# Patient Record
Sex: Female | Born: 1935 | Race: Black or African American | Hispanic: No | Smoking: Former smoker
Health system: Southern US, Community
[De-identification: ages and names within clinical notes are randomized; demographics above are authoritative.]

## PROBLEM LIST (undated history)

## (undated) DIAGNOSIS — K5790 Diverticulosis of intestine, part unspecified, without perforation or abscess without bleeding: Secondary | ICD-10-CM

## (undated) DIAGNOSIS — K222 Esophageal obstruction: Secondary | ICD-10-CM

## (undated) DIAGNOSIS — E785 Hyperlipidemia, unspecified: Secondary | ICD-10-CM

## (undated) DIAGNOSIS — K648 Other hemorrhoids: Secondary | ICD-10-CM

## (undated) DIAGNOSIS — I10 Essential (primary) hypertension: Secondary | ICD-10-CM

## (undated) DIAGNOSIS — K589 Irritable bowel syndrome without diarrhea: Secondary | ICD-10-CM

## (undated) DIAGNOSIS — K219 Gastro-esophageal reflux disease without esophagitis: Secondary | ICD-10-CM

## (undated) DIAGNOSIS — Z860100 Personal history of colon polyps, unspecified: Secondary | ICD-10-CM

## (undated) DIAGNOSIS — G459 Transient cerebral ischemic attack, unspecified: Secondary | ICD-10-CM

## (undated) DIAGNOSIS — J449 Chronic obstructive pulmonary disease, unspecified: Secondary | ICD-10-CM

## (undated) DIAGNOSIS — D689 Coagulation defect, unspecified: Secondary | ICD-10-CM

## (undated) DIAGNOSIS — F32A Depression, unspecified: Secondary | ICD-10-CM

## (undated) DIAGNOSIS — F329 Major depressive disorder, single episode, unspecified: Secondary | ICD-10-CM

## (undated) DIAGNOSIS — M1990 Unspecified osteoarthritis, unspecified site: Secondary | ICD-10-CM

## (undated) HISTORY — DX: Major depressive disorder, single episode, unspecified: F32.9

## (undated) HISTORY — DX: Personal history of colon polyps, unspecified: Z86.0100

## (undated) HISTORY — DX: Diverticulosis of intestine, part unspecified, without perforation or abscess without bleeding: K57.90

## (undated) HISTORY — DX: Unspecified osteoarthritis, unspecified site: M19.90

## (undated) HISTORY — DX: Irritable bowel syndrome, unspecified: K58.9

## (undated) HISTORY — DX: Coagulation defect, unspecified: D68.9

## (undated) HISTORY — DX: Gastro-esophageal reflux disease without esophagitis: K21.9

## (undated) HISTORY — DX: Hyperlipidemia, unspecified: E78.5

## (undated) HISTORY — DX: Essential (primary) hypertension: I10

## (undated) HISTORY — DX: Depression, unspecified: F32.A

## (undated) HISTORY — DX: Other hemorrhoids: K64.8

## (undated) HISTORY — DX: Personal history of colonic polyps: Z86.010

## (undated) HISTORY — DX: Esophageal obstruction: K22.2

## (undated) HISTORY — DX: Chronic obstructive pulmonary disease, unspecified: J44.9

---

## 1993-06-19 HISTORY — PX: LAPAROSCOPIC CHOLECYSTECTOMY: SUR755

## 1997-08-17 ENCOUNTER — Ambulatory Visit (HOSPITAL_COMMUNITY): Admission: RE | Admit: 1997-08-17 | Discharge: 1997-08-17 | Payer: Self-pay | Admitting: Obstetrics & Gynecology

## 1997-10-09 ENCOUNTER — Encounter: Admission: RE | Admit: 1997-10-09 | Discharge: 1997-10-09 | Payer: Self-pay | Admitting: Internal Medicine

## 1997-10-13 ENCOUNTER — Encounter: Admission: RE | Admit: 1997-10-13 | Discharge: 1997-10-13 | Payer: Self-pay | Admitting: Obstetrics & Gynecology

## 1997-10-13 ENCOUNTER — Other Ambulatory Visit: Admission: RE | Admit: 1997-10-13 | Discharge: 1997-10-13 | Payer: Self-pay | Admitting: Obstetrics & Gynecology

## 1998-09-07 ENCOUNTER — Ambulatory Visit (HOSPITAL_COMMUNITY): Admission: RE | Admit: 1998-09-07 | Discharge: 1998-09-07 | Payer: Self-pay | Admitting: Internal Medicine

## 1998-09-21 ENCOUNTER — Emergency Department (HOSPITAL_COMMUNITY): Admission: EM | Admit: 1998-09-21 | Discharge: 1998-09-21 | Payer: Self-pay | Admitting: Internal Medicine

## 1998-10-05 ENCOUNTER — Encounter: Admission: RE | Admit: 1998-10-05 | Discharge: 1998-10-05 | Payer: Self-pay | Admitting: Obstetrics & Gynecology

## 1998-10-06 ENCOUNTER — Emergency Department (HOSPITAL_COMMUNITY): Admission: EM | Admit: 1998-10-06 | Discharge: 1998-10-06 | Payer: Self-pay | Admitting: Emergency Medicine

## 1998-11-02 ENCOUNTER — Ambulatory Visit (HOSPITAL_COMMUNITY): Admission: RE | Admit: 1998-11-02 | Discharge: 1998-11-02 | Payer: Self-pay | Admitting: Internal Medicine

## 1998-11-02 ENCOUNTER — Encounter: Payer: Self-pay | Admitting: Internal Medicine

## 1998-11-02 ENCOUNTER — Encounter: Admission: RE | Admit: 1998-11-02 | Discharge: 1998-11-02 | Payer: Self-pay | Admitting: Internal Medicine

## 1998-12-10 ENCOUNTER — Encounter: Admission: RE | Admit: 1998-12-10 | Discharge: 1998-12-10 | Payer: Self-pay | Admitting: Internal Medicine

## 1998-12-16 ENCOUNTER — Encounter: Admission: RE | Admit: 1998-12-16 | Discharge: 1998-12-16 | Payer: Self-pay | Admitting: Internal Medicine

## 1998-12-17 ENCOUNTER — Emergency Department (HOSPITAL_COMMUNITY): Admission: EM | Admit: 1998-12-17 | Discharge: 1998-12-17 | Payer: Self-pay | Admitting: Emergency Medicine

## 1999-01-17 ENCOUNTER — Encounter: Admission: RE | Admit: 1999-01-17 | Discharge: 1999-01-17 | Payer: Self-pay | Admitting: Internal Medicine

## 1999-04-14 ENCOUNTER — Encounter: Admission: RE | Admit: 1999-04-14 | Discharge: 1999-04-14 | Payer: Self-pay | Admitting: Obstetrics

## 1999-06-21 ENCOUNTER — Encounter: Admission: RE | Admit: 1999-06-21 | Discharge: 1999-06-21 | Payer: Self-pay | Admitting: Obstetrics & Gynecology

## 1999-06-27 ENCOUNTER — Encounter: Admission: RE | Admit: 1999-06-27 | Discharge: 1999-06-27 | Payer: Self-pay | Admitting: Internal Medicine

## 1999-07-14 ENCOUNTER — Ambulatory Visit (HOSPITAL_COMMUNITY): Admission: RE | Admit: 1999-07-14 | Discharge: 1999-07-14 | Payer: Self-pay | Admitting: Gastroenterology

## 1999-09-05 ENCOUNTER — Ambulatory Visit (HOSPITAL_COMMUNITY): Admission: RE | Admit: 1999-09-05 | Discharge: 1999-09-05 | Payer: Self-pay | Admitting: Gastroenterology

## 1999-10-19 ENCOUNTER — Ambulatory Visit (HOSPITAL_COMMUNITY): Admission: RE | Admit: 1999-10-19 | Discharge: 1999-10-19 | Payer: Self-pay | Admitting: Internal Medicine

## 2000-08-20 ENCOUNTER — Ambulatory Visit (HOSPITAL_COMMUNITY): Admission: RE | Admit: 2000-08-20 | Discharge: 2000-08-20 | Payer: Self-pay | Admitting: Internal Medicine

## 2000-08-20 ENCOUNTER — Encounter: Admission: RE | Admit: 2000-08-20 | Discharge: 2000-08-20 | Payer: Self-pay | Admitting: Internal Medicine

## 2000-09-11 ENCOUNTER — Encounter: Admission: RE | Admit: 2000-09-11 | Discharge: 2000-09-11 | Payer: Self-pay | Admitting: Internal Medicine

## 2000-09-24 ENCOUNTER — Encounter: Admission: RE | Admit: 2000-09-24 | Discharge: 2000-09-24 | Payer: Self-pay | Admitting: Internal Medicine

## 2000-11-30 ENCOUNTER — Ambulatory Visit (HOSPITAL_COMMUNITY): Admission: RE | Admit: 2000-11-30 | Discharge: 2000-11-30 | Payer: Self-pay | Admitting: Internal Medicine

## 2000-11-30 ENCOUNTER — Encounter: Payer: Self-pay | Admitting: Internal Medicine

## 2001-04-10 ENCOUNTER — Emergency Department (HOSPITAL_COMMUNITY): Admission: EM | Admit: 2001-04-10 | Discharge: 2001-04-10 | Payer: Self-pay | Admitting: Emergency Medicine

## 2001-08-07 ENCOUNTER — Encounter: Admission: RE | Admit: 2001-08-07 | Discharge: 2001-08-07 | Payer: Self-pay

## 2001-09-23 ENCOUNTER — Encounter: Admission: RE | Admit: 2001-09-23 | Discharge: 2001-09-23 | Payer: Self-pay | Admitting: Internal Medicine

## 2001-10-02 ENCOUNTER — Encounter: Admission: RE | Admit: 2001-10-02 | Discharge: 2001-10-02 | Payer: Self-pay | Admitting: Internal Medicine

## 2001-10-09 ENCOUNTER — Encounter: Admission: RE | Admit: 2001-10-09 | Discharge: 2001-10-09 | Payer: Self-pay

## 2001-12-03 ENCOUNTER — Encounter: Payer: Self-pay | Admitting: Internal Medicine

## 2001-12-03 ENCOUNTER — Ambulatory Visit (HOSPITAL_COMMUNITY): Admission: RE | Admit: 2001-12-03 | Discharge: 2001-12-03 | Payer: Self-pay | Admitting: Internal Medicine

## 2001-12-11 ENCOUNTER — Encounter: Admission: RE | Admit: 2001-12-11 | Discharge: 2001-12-11 | Payer: Self-pay | Admitting: Orthopedic Surgery

## 2001-12-11 ENCOUNTER — Encounter: Payer: Self-pay | Admitting: Orthopedic Surgery

## 2001-12-28 ENCOUNTER — Emergency Department (HOSPITAL_COMMUNITY): Admission: EM | Admit: 2001-12-28 | Discharge: 2001-12-28 | Payer: Self-pay | Admitting: Emergency Medicine

## 2001-12-28 ENCOUNTER — Encounter: Payer: Self-pay | Admitting: Emergency Medicine

## 2002-06-10 ENCOUNTER — Other Ambulatory Visit: Admission: RE | Admit: 2002-06-10 | Discharge: 2002-06-10 | Payer: Self-pay | Admitting: Obstetrics and Gynecology

## 2002-09-01 ENCOUNTER — Encounter: Admission: RE | Admit: 2002-09-01 | Discharge: 2002-09-01 | Payer: Self-pay | Admitting: Internal Medicine

## 2002-09-12 ENCOUNTER — Encounter: Admission: RE | Admit: 2002-09-12 | Discharge: 2002-09-12 | Payer: Self-pay | Admitting: Internal Medicine

## 2002-09-12 ENCOUNTER — Encounter: Payer: Self-pay | Admitting: Internal Medicine

## 2002-10-13 ENCOUNTER — Ambulatory Visit (HOSPITAL_COMMUNITY): Admission: RE | Admit: 2002-10-13 | Discharge: 2002-10-13 | Payer: Self-pay | Admitting: Internal Medicine

## 2002-10-13 ENCOUNTER — Encounter: Admission: RE | Admit: 2002-10-13 | Discharge: 2002-10-13 | Payer: Self-pay | Admitting: Internal Medicine

## 2003-01-09 ENCOUNTER — Ambulatory Visit (HOSPITAL_COMMUNITY): Admission: RE | Admit: 2003-01-09 | Discharge: 2003-01-09 | Payer: Self-pay | Admitting: Internal Medicine

## 2003-01-09 ENCOUNTER — Encounter: Payer: Self-pay | Admitting: Internal Medicine

## 2003-10-14 ENCOUNTER — Encounter: Admission: RE | Admit: 2003-10-14 | Discharge: 2003-10-14 | Payer: Self-pay | Admitting: Internal Medicine

## 2003-10-27 ENCOUNTER — Encounter: Admission: RE | Admit: 2003-10-27 | Discharge: 2003-10-27 | Payer: Self-pay | Admitting: Internal Medicine

## 2003-12-15 ENCOUNTER — Ambulatory Visit (HOSPITAL_COMMUNITY): Admission: RE | Admit: 2003-12-15 | Discharge: 2003-12-15 | Payer: Self-pay | Admitting: Neurology

## 2004-02-16 ENCOUNTER — Ambulatory Visit (HOSPITAL_COMMUNITY): Admission: RE | Admit: 2004-02-16 | Discharge: 2004-02-16 | Payer: Self-pay | Admitting: Internal Medicine

## 2004-04-04 ENCOUNTER — Ambulatory Visit: Payer: Self-pay | Admitting: Internal Medicine

## 2004-04-27 ENCOUNTER — Ambulatory Visit: Payer: Self-pay | Admitting: Internal Medicine

## 2004-05-19 ENCOUNTER — Other Ambulatory Visit: Admission: RE | Admit: 2004-05-19 | Discharge: 2004-05-19 | Payer: Self-pay | Admitting: Obstetrics and Gynecology

## 2004-06-27 ENCOUNTER — Ambulatory Visit: Payer: Self-pay | Admitting: Internal Medicine

## 2005-03-07 ENCOUNTER — Ambulatory Visit (HOSPITAL_COMMUNITY): Admission: RE | Admit: 2005-03-07 | Discharge: 2005-03-07 | Payer: Self-pay | Admitting: Obstetrics and Gynecology

## 2005-05-11 ENCOUNTER — Emergency Department (HOSPITAL_COMMUNITY): Admission: EM | Admit: 2005-05-11 | Discharge: 2005-05-11 | Payer: Self-pay | Admitting: *Deleted

## 2005-07-10 ENCOUNTER — Other Ambulatory Visit: Admission: RE | Admit: 2005-07-10 | Discharge: 2005-07-10 | Payer: Self-pay | Admitting: Obstetrics and Gynecology

## 2005-07-28 ENCOUNTER — Ambulatory Visit (HOSPITAL_COMMUNITY): Admission: RE | Admit: 2005-07-28 | Discharge: 2005-07-28 | Payer: Self-pay | Admitting: Obstetrics and Gynecology

## 2006-04-14 ENCOUNTER — Emergency Department (HOSPITAL_COMMUNITY): Admission: EM | Admit: 2006-04-14 | Discharge: 2006-04-14 | Payer: Self-pay | Admitting: Emergency Medicine

## 2006-08-15 ENCOUNTER — Emergency Department (HOSPITAL_COMMUNITY): Admission: EM | Admit: 2006-08-15 | Discharge: 2006-08-15 | Payer: Self-pay | Admitting: Emergency Medicine

## 2006-09-08 ENCOUNTER — Observation Stay (HOSPITAL_COMMUNITY): Admission: EM | Admit: 2006-09-08 | Discharge: 2006-09-09 | Payer: Self-pay | Admitting: Emergency Medicine

## 2006-12-11 ENCOUNTER — Emergency Department (HOSPITAL_COMMUNITY): Admission: EM | Admit: 2006-12-11 | Discharge: 2006-12-11 | Payer: Self-pay | Admitting: Emergency Medicine

## 2007-07-11 ENCOUNTER — Ambulatory Visit (HOSPITAL_COMMUNITY): Admission: RE | Admit: 2007-07-11 | Discharge: 2007-07-11 | Payer: Self-pay | Admitting: Family Medicine

## 2008-09-17 ENCOUNTER — Ambulatory Visit: Payer: Self-pay | Admitting: Pulmonary Disease

## 2008-09-17 DIAGNOSIS — J309 Allergic rhinitis, unspecified: Secondary | ICD-10-CM

## 2008-09-17 DIAGNOSIS — J45909 Unspecified asthma, uncomplicated: Secondary | ICD-10-CM

## 2008-09-17 DIAGNOSIS — Z9089 Acquired absence of other organs: Secondary | ICD-10-CM | POA: Insufficient documentation

## 2008-09-17 DIAGNOSIS — K219 Gastro-esophageal reflux disease without esophagitis: Secondary | ICD-10-CM | POA: Insufficient documentation

## 2008-10-24 ENCOUNTER — Encounter: Payer: Self-pay | Admitting: Pulmonary Disease

## 2008-11-26 ENCOUNTER — Ambulatory Visit: Payer: Self-pay | Admitting: Pulmonary Disease

## 2010-07-10 ENCOUNTER — Encounter: Payer: Self-pay | Admitting: Orthopaedic Surgery

## 2010-07-10 ENCOUNTER — Encounter: Payer: Self-pay | Admitting: Obstetrics and Gynecology

## 2010-11-04 NOTE — H&P (Signed)
[name], [name]            ACCOUNT NO.:  [id number]   MEDICAL RECORD NO.:  [id number]          PATIENT TYPE:  INP   LOCATION:  1827                         FACILITY:  [facility]   PHYSICIAN:  [name], M.D.DATE OF BIRTH:  05/19/1936   DATE OF ADMISSION:  09/07/2006  DATE OF DISCHARGE:                              HISTORY & PHYSICAL   CHIEF COMPLAINT:  Chest pain.   HISTORY OF PRESENT ILLNESS:  The patient is a 75 year old black female  with past medical history significant for hypertension and asthma who  presents with complaints of chest pain.  She states that she was in her  usual state of health until today when she went walking during her break  and it was pretty strenuous walk, after she came back into the office  she became short of breath and thought it was an asthma attack and so  used her inhalers.  She reports that she began having chest pain -  burning, after she used the inhalers.  The burning sensation resolved.  Thereafter she began having a chest tightness, mid sternal in location,  radiating to her neck, 7/10 in intensity and this was while she was  driving home.  She stopped at a fire station and had her blood pressure  checked and it was 180/112.  She sat and waited there for some time.  They had it rechecked and it had improved slightly.  She states that the  chest pain lasted about 20 minutes and seemed to improve but started  back up again and she was also having headaches.  Patient also admits to  left hand numbness while she was having the chest pain as well as  nausea.  She denies diaphoresis.  Patient does not recall any  alleviating factors.  She reports a positive family history for heart  disease, and a remote history of cardiovascular disease in her teenage  years - for about 3 years.   In the ER she had point-of-care markers which were negative.  A chest x-  ray was negative for acute infiltrates and she is admitted to the [facility]  hospitalist service for further evaluation and management.   PAST MEDICAL HISTORY:  1. As stated above.  2. ? History of borderline high cholesterol.   MEDICATIONS:  1. Albuterol p.r.n.  2. Benicar - does not know the dose.  3. Singulair 10 mg daily.   ALLERGIES:  PENICILLIN.   SOCIAL HISTORY:  Remarkable history of tobacco as above for about 3  years while she was a teenager.  She denies alcohol.   FAMILY HISTORY:  Mother deceased at age 39, she had an MI.  Her sister  had chronic heart disease/congestive heart failure and is deceased as  well.  Her brother had coronary artery disease, had an MI in his late-  81s.  He is deceased as well.   REVIEW OF SYSTEMS:  As per HPI, other review of systems negative.   PHYSICAL EXAM:  IN GENERAL: The patient is a pleasant, elderly black  female, she is alert, appropriate, in no apparent distress.  VITAL SIGNS: Her blood  pressure is 136/75, initially 173/90, pulse is  75, respiratory rate is 16, O2 saturation is 100%.  HEENT: PERRL, EOMI, moist mucus membranes, sclerae anicteric, no oral  exudates.  NECK: Supple, no adenopathy, no thyromegaly, no JVD.  LUNGS: Moderate air movement, no wheezes and no crackles.  CARDIOVASCULAR: Regular rate and rhythm.  Normal S1/S2.  ABDOMEN: Soft, bowel sounds present, nontender, nondistended.  No  organomegaly and no masses palpable.  EXTREMITIES: No cyanosis and no edema.  NEURO: She is alert and oriented x3.  Cranial nerves II-XII grossly  intact.  Sensory is grossly intact.  Strength 5/5 and symmetric.   LABORATORY DATA:  Sodium is 140 with potassium of 3.6, chloride is 105,  BUN 12, glucose 91, pH is 7.38 with a pCO2 of 44, hemoglobin is 13.3,  hematocrit 39, creatinine is 1.0.  Point-of-care markers negative x1.  Urinalysis is unremarkable.   ASSESSMENT AND PLAN:  1. Chest pain - Will order CMP, cardiac enzymes, EKG and a fasting      lipid profile.  Placed patient on aspirin, nitrites, follow  and      consult Cardiology pending cardiac enzymes.  We will also obtain a      d-dimer, followup and further evaluate as indicated.  2. Uncontrolled hypertension - continue Prilosec, nitrites as above      p.r.n. Vasotec followup.  3. History of asthma - continue bronchodilators and Singulair.      [name], M.D.  Electronically Signed     ACV/MEDQ  D:  09/08/2006  T:  09/08/2006  Job:  [id number]   cc:   Dr. Vedia Coffer

## 2010-11-04 NOTE — Consult Note (Signed)
[name], [name]            ACCOUNT NO.:  [id number]   MEDICAL RECORD NO.:  [id number]          PATIENT TYPE:  INP   LOCATION:  4735                         FACILITY:  [facility]   PHYSICIAN:  Corky Crafts, MDDATE OF BIRTH:  05-08-1936   DATE OF CONSULTATION:  09/08/2006  DATE OF DISCHARGE:                                 CONSULTATION   REFERRING PHYSICIAN:  Michelene Gardener, M.D.   PRIMARY CARE PHYSICIAN:  Erin E Black, DO   REASON FOR CONSULTATION:  Chest pain.   HISTORY OF PRESENT ILLNESS:  The patient is a 75 year old woman with  hypertension who had chest pressure starting yesterday. She was walking  fast up a hill which is something she was not used to doing. She felt an  asthma attack coming on. She took several puffs of her inhaler which was  more than usual. Few hours later she became nauseated and she felt  heartburn sensation. She checked her blood pressure and it was 180/112,  she was sent to the emergency room.   The patient was actually sent to the emergency room several weeks ago  because of chest pressure.  She was not admitted at that time.   Her chest pressure does seem to radiate at times. She has been having  pain on and off while she was in the hospital.  There has been no nausea  here, no diaphoresis.  No lightheadedness and no syncope.   PAST MEDICAL HISTORY:  1. Hypertension.  2. Asthma.   PAST SURGICAL HISTORY:  C-section, cholecystectomy.   ALLERGIES.:  PENICILLIN.   MEDICATIONS AT HOME:  Benicar, Singulair MDI, albuterol MDI.   SOCIAL HISTORY:  The patient does not smoke.  She does not drink.  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   FAMILY HISTORY:  No early coronary artery disease.   REVIEW OF SYSTEMS:  Significant for the chest pressure and shortness of  breath.  No lightheadedness, no syncope.  No palpitations.  No focal  weakness.  She did have some left arm pain at times as well.  All other  systems  negative.   PHYSICAL EXAM:  VITAL SIGNS:  Blood pressure is 101/62, heart rate is  79.  GENERAL:  The patient is awake, alert, no apparent distress.  HEAD:  Normocephalic, atraumatic.  EYES:  Extraocular movements intact.  NECK:  No carotid bruits.  CARDIOVASCULAR:  Regular rate and rhythm, S1-S2.  LUNGS:  Clear to auscultation bilaterally.  ABDOMEN:  Soft, nontender, nondistended.  EXTREMITIES:  Showed no edema.  NEURO:  No focal deficits.   EKG normal sinus rhythm, no ST-T wave changes.   LABORATORY DATA:  Troponin negative x2.  Creatinine 0.8, hematocrit  32.4. Cholesterol panel shows LDL of 114, total cholesterol 172, HDL of  45, triglycerides 65.   ASSESSMENT/PLAN:  A 75 year old with hypertension and chest pain.   PLAN:  1. Continue rule out for MI.  We discussed the stress test versus a      catheterization, if her troponins are negative x3 and chest pain      resolves, we  will plan for outpatient stress test.  2. She has a CT scan of the chest looking for PE scheduled for later      today.  3. Continue Benicar and aspirin.  4. She does not have a strong indication for any lipid lowering      therapy at this point.  5. If her chest pain continues, we will consider angiogram given that      she has now had two trips to the hospital for similar symptoms.  6. I will follow the patient in the hospital.      Corky Crafts, MD  Electronically Signed     JSV/MEDQ  D:  09/08/2006  T:  09/08/2006  Job:  [id number]

## 2011-04-05 LAB — POCT CARDIAC MARKERS
Myoglobin, poc: 75.3
Myoglobin, poc: 78.7
Operator id: 277751
Operator id: 277751
Troponin i, poc: <0.05

## 2011-04-05 LAB — I-STAT 8, (EC8 V) (CONVERTED LAB)
Bicarbonate: 28.1 — ABNORMAL HIGH
Chloride: 106
Glucose, Bld: 80
Potassium: 3.6
Sodium: 140
TCO2: 30
pH, Ven: 7.334 — ABNORMAL HIGH

## 2011-04-05 LAB — COMPREHENSIVE METABOLIC PANEL
CO2: 25
Calcium: 9.4
Chloride: 103
Creatinine, Ser: 0.92
Sodium: 139
Total Bilirubin: 1

## 2011-04-05 LAB — CBC
MCV: 88.1
Platelets: 272
RDW: 13.5
WBC: 4.8

## 2011-08-25 ENCOUNTER — Encounter: Payer: Self-pay | Admitting: Gastroenterology

## 2011-09-20 ENCOUNTER — Telehealth: Payer: Self-pay | Admitting: *Deleted

## 2011-09-20 NOTE — Telephone Encounter (Signed)
Spoke with Dr. Arlyce Dice regarding acceptance of this patient from prior GI practice. Dr. Arlyce Dice accepts this patient. All records from prior GI procedures obtained and available with chart to be scanned/TE

## 2011-09-28 ENCOUNTER — Ambulatory Visit (AMBULATORY_SURGERY_CENTER): Payer: Medicare Other | Admitting: *Deleted

## 2011-09-28 VITALS — Ht 60.0 in | Wt 140.3 lb

## 2011-09-28 DIAGNOSIS — Z1211 Encounter for screening for malignant neoplasm of colon: Secondary | ICD-10-CM

## 2011-09-28 NOTE — Progress Notes (Signed)
Pt here today for PV for recall colonoscopy.  Has not seen Dr. Arlyce Dice before.  Pt is complaining of abdominal pain and esophagus burning after eating.  Also complaining of "gas bubbling up in her throat after eating"; waking her up at night.  This has been a problem for about 2 months.  Pt would like to be seen in office and have colonoscopy after that office visit.  New patient appt with Dr. Arlyce Dice scheduled for 5/15 at 9:00.  Colonoscopy cancelled.  Pt instructed to call office if symptoms worsen. Ezra Sites

## 2011-10-05 ENCOUNTER — Encounter: Payer: Self-pay | Admitting: Gastroenterology

## 2011-10-18 DIAGNOSIS — K222 Esophageal obstruction: Secondary | ICD-10-CM

## 2011-10-18 DIAGNOSIS — K5790 Diverticulosis of intestine, part unspecified, without perforation or abscess without bleeding: Secondary | ICD-10-CM

## 2011-10-18 HISTORY — DX: Esophageal obstruction: K22.2

## 2011-10-18 HISTORY — DX: Diverticulosis of intestine, part unspecified, without perforation or abscess without bleeding: K57.90

## 2011-11-01 ENCOUNTER — Encounter: Payer: Self-pay | Admitting: Gastroenterology

## 2011-11-01 ENCOUNTER — Ambulatory Visit (INDEPENDENT_AMBULATORY_CARE_PROVIDER_SITE_OTHER): Payer: Medicare Other | Admitting: Gastroenterology

## 2011-11-01 VITALS — BP 134/76 | HR 60 | Ht 60.0 in | Wt 141.0 lb

## 2011-11-01 DIAGNOSIS — R1310 Dysphagia, unspecified: Secondary | ICD-10-CM | POA: Insufficient documentation

## 2011-11-01 DIAGNOSIS — Z800 Family history of malignant neoplasm of digestive organs: Secondary | ICD-10-CM

## 2011-11-01 DIAGNOSIS — K219 Gastro-esophageal reflux disease without esophagitis: Secondary | ICD-10-CM

## 2011-11-01 DIAGNOSIS — Z860100 Personal history of colon polyps, unspecified: Secondary | ICD-10-CM | POA: Insufficient documentation

## 2011-11-01 MED ORDER — OMEPRAZOLE-SODIUM BICARBONATE 40-1100 MG PO CAPS: 1.0000 | ORAL_CAPSULE | Freq: Every day | ORAL | Status: DC

## 2011-11-01 MED ORDER — MOVIPREP 100 G PO SOLR: 1.0000 | Freq: Once | ORAL | Status: DC

## 2011-11-01 NOTE — Progress Notes (Signed)
History of Present Illness: [name] is a pleasant 76 year old Afro-American female referred at the request of Dr. Zachery Dauer for evaluation of reflux and history of colon polyps. She's had reflux for years for which she takes omeprazole before breakfast and dinner. She has frequent regurgitation of gastric contents at night with a choking sensation and subsequent cough. Symptoms may also occur during the day. She complains of dysphagia to solids. She is on no gastric irritants including nonsteroidals.  Patient has a family history of colon cancer. Her sister developed colon cancer at age 64. Last colonoscopy 5 years ago apparently demonstrated polyps which were removed. She has no lower GI complaints.    Past Medical History  Diagnosis Date  . Hypertension   . H/O seasonal allergies   . Asthma   . GERD (gastroesophageal reflux disease)   . Hx of colonic polyp   . Hemorrhoids, internal   . Hyperlipemia   . Asthma    Past Surgical History  Procedure Date  . Cesarean section 1962  . Laparoscopic cholecystectomy 1995   family history includes Breast cancer in an unspecified family member; Colon cancer (age of onset:60) in her sister; and Irritable bowel syndrome in her sister.  There is no history of Stomach cancer. Current Outpatient Prescriptions  Medication Sig Dispense Refill  . Ascorbic Acid (VITAMIN C) 1000 MG tablet Take 1,000 mg by mouth daily.      . B Complex Vitamins (B-COMPLEX/B-12 PO) Take by mouth daily.      Marland Kitchen DIGESTIVE ENZYMES PO Take by mouth daily.      Tery Sanfilippo Calcium (STOOL SOFTENER PO) Take by mouth as needed.      Marland Kitchen losartan-hydrochlorothiazide (HYZAAR) 50-12.5 MG per tablet Take 1 tablet by mouth daily.       . montelukast (SINGULAIR) 10 MG tablet Take 10 mg by mouth at bedtime.      . Multiple Vitamin (MULTIVITAMIN) tablet Take 1 tablet by mouth daily.      Marland Kitchen omeprazole (PRILOSEC) 20 MG capsule Take 20 mg by mouth daily. Takes 2 tablets in morning and 1 tablet  at bedtime.      . Probiotic Product (PROBIOTIC PO) Take by mouth daily. Takes 2 capsules daily       Allergies as of 11/01/2011 - Review Complete 11/01/2011  Allergen Reaction Noted  . Penicillins Shortness Of Breath and Swelling   . Cepacol  09/28/2011    reports that she quit smoking about 52 years ago. She has never used smokeless tobacco. She reports that she does not drink alcohol or use illicit drugs.     Review of Systems: She complains of joint pains in her hands and knees. Pertinent positive and negative review of systems were noted in the above HPI section. All other review of systems were otherwise negative.  Vital signs were reviewed in today's medical record Physical Exam: General: Well developed , well nourished, no acute distress Head: Normocephalic and atraumatic Eyes:  sclerae anicteric, EOMI Ears: Normal auditory acuity Mouth: No deformity or lesions Neck: Supple, no masses or thyromegaly Lungs: Clear throughout to auscultation Heart: Regular rate and rhythm; no murmurs, rubs or bruits Abdomen: Soft, non tender and non distended. No masses, hepatosplenomegaly or hernias noted. Normal Bowel sounds Rectal:deferred Musculoskeletal: Symmetrical with no gross deformities  Skin: No lesions on visible extremities Pulses:  Normal pulses noted Extremities: No clubbing, cyanosis, edema or deformities noted Neurological: Alert oriented x 4, grossly nonfocal Cervical Nodes:  No significant cervical adenopathy Inguinal  Nodes: No significant inguinal adenopathy Psychological:  Alert and cooperative. Normal mood and affect

## 2011-11-01 NOTE — Assessment & Plan Note (Signed)
Rule out peptic esophageal stricture.  Recommendations #1 upper endoscopy with dilatation as indicated 

## 2011-11-01 NOTE — Patient Instructions (Addendum)
You have been scheduled for an endoscopy with dilation and colonoscopy with propofol. Please follow the written instructions given to you at your visit today. Please pick up your prep at the pharmacy within the next 1-3 days.     Gastroesophageal Reflux Disease, Adult Gastroesophageal reflux disease (GERD) happens when acid from your stomach flows up into the esophagus. When acid comes in contact with the esophagus, the acid causes soreness (inflammation) in the esophagus. Over time, GERD may create small holes (ulcers) in the lining of the esophagus. CAUSES   Increased body weight. This puts pressure on the stomach, making acid rise from the stomach into the esophagus.   Smoking. This increases acid production in the stomach.   Drinking alcohol. This causes decreased pressure in the lower esophageal sphincter (valve or ring of muscle between the esophagus and stomach), allowing acid from the stomach into the esophagus.   Late evening meals and a full stomach. This increases pressure and acid production in the stomach.   A malformed lower esophageal sphincter.  Sometimes, no cause is found. SYMPTOMS   Burning pain in the lower part of the mid-chest behind the breastbone and in the mid-stomach area. This may occur twice a week or more often.   Trouble swallowing.   Sore throat.   Dry cough.   Asthma-like symptoms including chest tightness, shortness of breath, or wheezing.  DIAGNOSIS  Your caregiver may be able to diagnose GERD based on your symptoms. In some cases, X-rays and other tests may be done to check for complications or to check the condition of your stomach and esophagus. TREATMENT  Your caregiver may recommend over-the-counter or prescription medicines to help decrease acid production. Ask your caregiver before starting or adding any new medicines.  HOME CARE INSTRUCTIONS   Change the factors that you can control. Ask your caregiver for guidance concerning weight  loss, quitting smoking, and alcohol consumption.   Avoid foods and drinks that make your symptoms worse, such as:   Caffeine or alcoholic drinks.   Chocolate.   Peppermint or mint flavorings.   Garlic and onions.   Spicy foods.   Citrus fruits, such as oranges, lemons, or limes.   Tomato-based foods such as sauce, chili, salsa, and pizza.   Fried and fatty foods.   Avoid lying down for the 3 hours prior to your bedtime or prior to taking a nap.   Eat small, frequent meals instead of large meals.   Wear loose-fitting clothing. Do not wear anything tight around your waist that causes pressure on your stomach.   Raise the head of your bed 6 to 8 inches with wood blocks to help you sleep. Extra pillows will not help.   Only take over-the-counter or prescription medicines for pain, discomfort, or fever as directed by your caregiver.   Do not take aspirin, ibuprofen, or other nonsteroidal anti-inflammatory drugs (NSAIDs).  SEEK IMMEDIATE MEDICAL CARE IF:   You have pain in your arms, neck, jaw, teeth, or back.   Your pain increases or changes in intensity or duration.   You develop nausea, vomiting, or sweating (diaphoresis).   You develop shortness of breath, or you faint.   Your vomit is green, yellow, black, or looks like coffee grounds or blood.   Your stool is red, bloody, or black.  These symptoms could be signs of other problems, such as heart disease, gastric bleeding, or esophageal bleeding. MAKE SURE YOU:   Understand these instructions.   Will watch your condition.  Will get help right away if you are not doing well or get worse.  Document Released: 03/15/2005 Document Revised: 05/25/2011 Document Reviewed: 12/23/2010 [facility] Patient Information 2012 [place], [place].

## 2011-11-01 NOTE — Assessment & Plan Note (Signed)
The patient has primarily nocturnal GERD. This may be exacerbating or causing asthma.  Medications #1 trial of Zegerid 40 mg each bedtime

## 2011-11-01 NOTE — Assessment & Plan Note (Signed)
Plan followup colonoscopy 

## 2011-11-02 ENCOUNTER — Telehealth: Payer: Self-pay | Admitting: *Deleted

## 2011-11-02 MED ORDER — ESOMEPRAZOLE MAGNESIUM 40 MG PO CPDR: 40.0000 mg | DELAYED_RELEASE_CAPSULE | Freq: Every day | ORAL | Status: DC

## 2011-11-02 NOTE — Telephone Encounter (Signed)
Insurance will not pay for Zegerid. Patient will try Nexium for 2 weeks then call us back to let us know if it works. Nexium is the preferred drug on patients formulary

## 2011-11-16 ENCOUNTER — Ambulatory Visit (AMBULATORY_SURGERY_CENTER): Payer: Medicare Other | Admitting: Gastroenterology

## 2011-11-16 ENCOUNTER — Encounter: Payer: Self-pay | Admitting: Gastroenterology

## 2011-11-16 VITALS — BP 117/82 | HR 73 | Temp 98.6°F | Resp 17 | Ht 60.0 in | Wt 141.0 lb

## 2011-11-16 DIAGNOSIS — R1310 Dysphagia, unspecified: Secondary | ICD-10-CM

## 2011-11-16 DIAGNOSIS — K219 Gastro-esophageal reflux disease without esophagitis: Secondary | ICD-10-CM

## 2011-11-16 DIAGNOSIS — Z86010 Personal history of colonic polyps: Secondary | ICD-10-CM

## 2011-11-16 DIAGNOSIS — K222 Esophageal obstruction: Secondary | ICD-10-CM

## 2011-11-16 DIAGNOSIS — D131 Benign neoplasm of stomach: Secondary | ICD-10-CM

## 2011-11-16 DIAGNOSIS — Z800 Family history of malignant neoplasm of digestive organs: Secondary | ICD-10-CM

## 2011-11-16 DIAGNOSIS — Z1211 Encounter for screening for malignant neoplasm of colon: Secondary | ICD-10-CM

## 2011-11-16 MED ORDER — SODIUM CHLORIDE 0.9 % IV SOLN: 500.0000 mL | INTRAVENOUS | Status: DC

## 2011-11-16 NOTE — Patient Instructions (Signed)

## 2011-11-16 NOTE — Op Note (Signed)
[facility] [address] [place], [place]  [address]  COLONOSCOPY PROCEDURE REPORT  PATIENT:  [name], [name]  MR#:  [id number] BIRTHDATE:  1936/05/24, 76 yrs. old  GENDER:  female ENDOSCOPIST:  Barbette Hair. Arlyce Dice, MD REF. BY:  Juluis Rainier, M.D. PROCEDURE DATE:  11/16/2011 PROCEDURE:  Diagnostic Colonoscopy ASA CLASS:  Class II INDICATIONS:  Screening, family history of colon cancer Sibling MEDICATIONS:   MAC sedation, administered by CRNA propofol 200mg IV  DESCRIPTION OF PROCEDURE:   After the risks benefits and alternatives of the procedure were thoroughly explained, informed consent was obtained.  Digital rectal exam was performed and revealed no abnormalities.   The LB CF-H180AL [id number] endoscope was introduced through the anus and advanced to the cecum, which was identified by both the appendix and ileocecal valve, without limitations.  The quality of the prep was excellent, using MoviPrep.  The instrument was then slowly withdrawn as the colon was fully examined. <<PROCEDUREIMAGES>>  FINDINGS:  Mild diverticulosis was found in the sigmoid colon. Scattered diverticula were found in the mid transverse colon (see image2).  Internal Hemorrhoids were found (see image3).  This was otherwise a normal examination of the colon (see image1). Retroflexed views in the rectum revealed no abnormalities.    The time to cecum =  1) 3.25  minutes. The scope was then withdrawn in 1) 6.0  minutes from the cecum and the procedure completed. COMPLICATIONS:  None ENDOSCOPIC IMPRESSION: 1) Mild diverticulosis in the sigmoid colon 2) Diverticula, scattered in the mid transverse colon 3) Internal hemorrhoids 4) Otherwise normal examination RECOMMENDATIONS: 1) Given your significant family history of colon cancer, you should have a repeat colonoscopy in 5 years REPEAT EXAM:  In 5 year(s) for Colonoscopy.  ______________________________ Barbette Hair. Arlyce Dice,  MD  CC:  n. eSIGNED:   Barbette Hair. Angelicia Lessner at 11/16/2011 02:33 PM  [place], [name], [phone]

## 2011-11-16 NOTE — Op Note (Signed)
[facility] [address] [place], [place]  [address]  ENDOSCOPY PROCEDURE REPORT  PATIENT:  Barbara, [name]  MR#:  [id number] BIRTHDATE:  May 21, 1936, 76 yrs. old  GENDER:  female  ENDOSCOPIST:  Barbette Hair. Arlyce Dice, MD Referred by:  Juluis Rainier, M.D.  PROCEDURE DATE:  11/16/2011 PROCEDURE:  EGD, diagnostic 43235, Maloney Dilation of Esophagus ASA CLASS:  Class II INDICATIONS:  dysphagia  MEDICATIONS:   There was residual sedation effect present from prior procedure., MAC sedation, administered by CRNA propofol 40mg IV TOPICAL ANESTHETIC:  DESCRIPTION OF PROCEDURE:   After the risks and benefits of the procedure were explained, informed consent was obtained.  The LB GIF-H180 [id number] endoscope was introduced through the mouth and advanced to the third portion of the duodenum.  The instrument was slowly withdrawn as the mucosa was fully examined. <<PROCEDUREIMAGES>>  There were multiple polyps identified. in the body and the antrum of the stomach (see image3). Typical appearing gastric fundic polyps  A stricture was found at the gastroesophageal junction (see image1). Early stricture Dilation with maloney dilator 18mm Mild resistance; no heme  Otherwise the examination was normal (see image2 and image4).    Retroflexed views revealed no abnormalities.    The scope was then withdrawn from the patient and the procedure completed.  COMPLICATIONS:  None  ENDOSCOPIC IMPRESSION: 1) Polyps, multiple in the body and the antrum of the stomach 2) Stricture at the gastroesophageal junction - s/p maloney dilitation 3) Otherwise normal examination RECOMMENDATIONS: 1) continue PPI 2) Call office next 2-3 days to schedule an office appointment for 1 month  ______________________________ Barbette Hair. Arlyce Dice, MD  CC:  n. eSIGNED:   Barbette Hair. Logen Fowle at 11/16/2011 02:37 PM  [place], [name], [phone]

## 2011-11-16 NOTE — Progress Notes (Addendum)
Propofol per [facility] crna. See scanned crna intra procedure report. ewm  Pt tolerated procedure well. ewm 

## 2011-11-16 NOTE — Progress Notes (Signed)
Patient did not experience any of the following events: a burn prior to discharge; a fall within the facility; wrong site/side/patient/procedure/implant event; or a hospital transfer or hospital admission upon discharge from the facility. (G8907) Patient did not have preoperative order for IV antibiotic SSI prophylaxis. (G8918)  

## 2011-11-17 ENCOUNTER — Telehealth: Payer: Self-pay | Admitting: *Deleted

## 2011-11-17 NOTE — Telephone Encounter (Signed)
  Follow up Call-  Call back number 11/16/2011  Post procedure Call Back phone  # [phone]  Permission to leave phone message Yes     Patient questions:  Do you have a fever, pain , or abdominal swelling? no Pain Score  0 *  Have you tolerated food without any problems? yes  Have you been able to return to your normal activities? yes  Do you have any questions about your discharge instructions: Diet   no Medications  no Follow up visit  no  Do you have questions or concerns about your Care? no  Actions: * If pain score is 4 or above: No action needed, pain <4.

## 2011-12-13 ENCOUNTER — Ambulatory Visit (INDEPENDENT_AMBULATORY_CARE_PROVIDER_SITE_OTHER): Payer: Medicare Other | Admitting: Gastroenterology

## 2011-12-13 ENCOUNTER — Encounter: Payer: Self-pay | Admitting: Gastroenterology

## 2011-12-13 VITALS — BP 132/68 | HR 60 | Ht 60.0 in | Wt 139.0 lb

## 2011-12-13 DIAGNOSIS — K222 Esophageal obstruction: Secondary | ICD-10-CM

## 2011-12-13 DIAGNOSIS — Z800 Family history of malignant neoplasm of digestive organs: Secondary | ICD-10-CM

## 2011-12-13 DIAGNOSIS — R1310 Dysphagia, unspecified: Secondary | ICD-10-CM

## 2011-12-13 NOTE — Patient Instructions (Addendum)
FOLLOW UP AS NEEDED

## 2011-12-13 NOTE — Assessment & Plan Note (Signed)
Resolved following dilatation of an esophageal stricture 

## 2011-12-13 NOTE — Progress Notes (Signed)
History of Present Illness:  [name] has returned following upper and lower endoscopy. A distal esophageal stricture was dilated. Hyperplastic gastric polyps were seen. Colonoscopy demonstrated diverticulosis. She is no longer has dysphagia. She has occasional episodes of postprandial nausea. Nausea also occurs when she becomes anxious.    Review of Systems: Pertinent positive and negative review of systems were noted in the above HPI section. All other review of systems were otherwise negative.    Current Medications, Allergies, Past Medical History, Past Surgical History, Family History and Social History were reviewed in [employer] electronic medical record  Vital signs were reviewed in today's medical record. Physical Exam: General: Well developed , well nourished, no acute distress

## 2011-12-13 NOTE — Assessment & Plan Note (Signed)
Followup colonoscopy 2018 

## 2012-04-11 ENCOUNTER — Other Ambulatory Visit (HOSPITAL_COMMUNITY): Payer: Self-pay | Admitting: Family Medicine

## 2012-04-11 DIAGNOSIS — M8580 Other specified disorders of bone density and structure, unspecified site: Secondary | ICD-10-CM

## 2012-04-25 ENCOUNTER — Ambulatory Visit (HOSPITAL_COMMUNITY): Payer: Medicare Other

## 2012-05-09 ENCOUNTER — Ambulatory Visit (HOSPITAL_COMMUNITY): Admission: RE | Admit: 2012-05-09 | Payer: Medicare Other | Source: Ambulatory Visit

## 2012-05-30 ENCOUNTER — Ambulatory Visit (HOSPITAL_COMMUNITY): Payer: Medicare Other

## 2012-06-06 ENCOUNTER — Ambulatory Visit (HOSPITAL_COMMUNITY)
Admission: RE | Admit: 2012-06-06 | Discharge: 2012-06-06 | Disposition: A | Discharge status: Home / Self Care | Payer: Medicare Other | Source: Ambulatory Visit | Attending: Family Medicine | Admitting: Family Medicine

## 2012-06-06 DIAGNOSIS — Z780 Asymptomatic menopausal state: Secondary | ICD-10-CM | POA: Insufficient documentation

## 2012-06-06 DIAGNOSIS — Z13820 Encounter for screening for osteoporosis: Secondary | ICD-10-CM | POA: Insufficient documentation

## 2012-06-25 DIAGNOSIS — K219 Gastro-esophageal reflux disease without esophagitis: Secondary | ICD-10-CM | POA: Insufficient documentation

## 2012-12-29 ENCOUNTER — Emergency Department (HOSPITAL_COMMUNITY)
Admission: EM | Admit: 2012-12-29 | Discharge: 2012-12-29 | Disposition: A | Discharge status: Home / Self Care | Payer: Medicare Other | Attending: Emergency Medicine | Admitting: Emergency Medicine

## 2012-12-29 ENCOUNTER — Encounter (HOSPITAL_COMMUNITY): Payer: Self-pay | Admitting: Emergency Medicine

## 2012-12-29 DIAGNOSIS — Y929 Unspecified place or not applicable: Secondary | ICD-10-CM | POA: Insufficient documentation

## 2012-12-29 DIAGNOSIS — F3289 Other specified depressive episodes: Secondary | ICD-10-CM | POA: Insufficient documentation

## 2012-12-29 DIAGNOSIS — I10 Essential (primary) hypertension: Secondary | ICD-10-CM | POA: Insufficient documentation

## 2012-12-29 DIAGNOSIS — Z87891 Personal history of nicotine dependence: Secondary | ICD-10-CM | POA: Insufficient documentation

## 2012-12-29 DIAGNOSIS — L539 Erythematous condition, unspecified: Secondary | ICD-10-CM | POA: Insufficient documentation

## 2012-12-29 DIAGNOSIS — R609 Edema, unspecified: Secondary | ICD-10-CM | POA: Insufficient documentation

## 2012-12-29 DIAGNOSIS — R0789 Other chest pain: Secondary | ICD-10-CM | POA: Insufficient documentation

## 2012-12-29 DIAGNOSIS — F329 Major depressive disorder, single episode, unspecified: Secondary | ICD-10-CM | POA: Insufficient documentation

## 2012-12-29 DIAGNOSIS — Z79899 Other long term (current) drug therapy: Secondary | ICD-10-CM | POA: Insufficient documentation

## 2012-12-29 DIAGNOSIS — T6391XA Toxic effect of contact with unspecified venomous animal, accidental (unintentional), initial encounter: Secondary | ICD-10-CM | POA: Insufficient documentation

## 2012-12-29 DIAGNOSIS — T63461A Toxic effect of venom of wasps, accidental (unintentional), initial encounter: Secondary | ICD-10-CM | POA: Insufficient documentation

## 2012-12-29 DIAGNOSIS — Z880 Allergy status to penicillin: Secondary | ICD-10-CM | POA: Insufficient documentation

## 2012-12-29 DIAGNOSIS — H9319 Tinnitus, unspecified ear: Secondary | ICD-10-CM | POA: Insufficient documentation

## 2012-12-29 DIAGNOSIS — R062 Wheezing: Secondary | ICD-10-CM | POA: Insufficient documentation

## 2012-12-29 DIAGNOSIS — J45909 Unspecified asthma, uncomplicated: Secondary | ICD-10-CM | POA: Insufficient documentation

## 2012-12-29 DIAGNOSIS — Y9389 Activity, other specified: Secondary | ICD-10-CM | POA: Insufficient documentation

## 2012-12-29 DIAGNOSIS — R0602 Shortness of breath: Secondary | ICD-10-CM

## 2012-12-29 DIAGNOSIS — K219 Gastro-esophageal reflux disease without esophagitis: Secondary | ICD-10-CM | POA: Insufficient documentation

## 2012-12-29 DIAGNOSIS — M129 Arthropathy, unspecified: Secondary | ICD-10-CM | POA: Insufficient documentation

## 2012-12-29 DIAGNOSIS — E785 Hyperlipidemia, unspecified: Secondary | ICD-10-CM | POA: Insufficient documentation

## 2012-12-29 MED ORDER — DIPHENHYDRAMINE HCL 25 MG PO TABS: 25.0000 mg | ORAL_TABLET | Freq: Four times a day (QID) | ORAL | Status: DC | PRN

## 2012-12-29 MED ORDER — RANITIDINE HCL 150 MG PO CAPS: 150.0000 mg | ORAL_CAPSULE | Freq: Two times a day (BID) | ORAL | Status: DC

## 2012-12-29 MED ORDER — LIDOCAINE-PRILOCAINE 2.5-2.5 % EX CREA
TOPICAL_CREAM | Freq: Once | CUTANEOUS | Status: AC
  Administered 2012-12-29: 07:00:00 via TOPICAL
  Filled 2012-12-29: qty 5

## 2012-12-29 MED ORDER — ALBUTEROL SULFATE (5 MG/ML) 0.5% IN NEBU
5.0000 mg | INHALATION_SOLUTION | RESPIRATORY_TRACT | Status: DC
  Administered 2012-12-29: 5 mg via RESPIRATORY_TRACT
  Filled 2012-12-29: qty 1

## 2012-12-29 MED ORDER — IBUPROFEN 200 MG PO TABS
600.0000 mg | ORAL_TABLET | Freq: Once | ORAL | Status: AC
  Administered 2012-12-29: 600 mg via ORAL
  Filled 2012-12-29: qty 1

## 2012-12-29 MED ORDER — FAMOTIDINE 20 MG PO TABS
20.0000 mg | ORAL_TABLET | Freq: Once | ORAL | Status: AC
  Administered 2012-12-29: 20 mg via ORAL
  Filled 2012-12-29: qty 1

## 2012-12-29 MED ORDER — IPRATROPIUM BROMIDE 0.02 % IN SOLN
0.5000 mg | RESPIRATORY_TRACT | Status: DC
  Administered 2012-12-29: 0.5 mg via RESPIRATORY_TRACT
  Filled 2012-12-29: qty 2.5

## 2012-12-29 NOTE — ED Provider Notes (Signed)
History    CSN: [id number] Arrival date & time 12/29/12  0407  First MD Initiated Contact with Patient 12/29/12 [phone]     Chief Complaint  Patient presents with  . Insect Bite   (Consider location/radiation/quality/duration/timing/severity/associated sxs/prior Treatment) HPI Comments: Stung by a bee on the left hand about 8:30 last night initially took benadryl and took a  Singulair, fell asleep woke with a strange feeling in her ears, her arm hurt and she felt like her breathing was tight  She used an albuterol inhaler and came for evaluation   The history is provided by the patient.   Past Medical History  Diagnosis Date  . Hypertension   . H/O seasonal allergies   . Asthma   . GERD (gastroesophageal reflux disease)   . Hx of colonic polyp   . Hemorrhoids, internal   . Hyperlipemia   . Asthma   . Allergy   . Arthritis   . Cataract   . Depression   . Heart murmur   . Diverticulosis 10-2011    Colonoscopy  . Hemorrhoids 10-2011    Colonoscopy  . 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    EGD   Past Surgical History  Procedure Laterality Date  . Cesarean section  1962  . Laparoscopic cholecystectomy  1995   Family History  Problem Relation Age of Onset  . Colon cancer Sister 49  . Stomach cancer Neg Hx   . Breast cancer      Niece  . Irritable bowel syndrome Sister    History  Substance Use Topics  . Smoking status: Former Smoker    Quit date: 09/28/1959  . Smokeless tobacco: Never Used  . Alcohol Use: No   OB History   Grav Para Term Preterm Abortions TAB SAB Ect Mult Living                 Review of Systems  Constitutional: Negative for fever and chills.  HENT: Positive for tinnitus. Negative for hearing loss, ear pain and ear discharge.   Respiratory: Positive for chest tightness. Negative for shortness of breath and wheezing.   Cardiovascular: Negative for palpitations.  Skin: Positive for color change and wound.  All other systems reviewed and are  negative.    Allergies  Cepacol; Lactose intolerance (gi); and Penicillins  Home Medications   Current Outpatient Rx  Name  Route  Sig  Dispense  Refill  . albuterol (PROVENTIL) (2.5 MG/3ML) 0.083% nebulizer solution   Nebulization   Take 2.5 mg by nebulization every 6 (six) hours as needed for wheezing.         . Alpha-D-Galactosidase (BEANO PO)   Oral   Take 1 tablet by mouth as needed (gas and bloating).          . Ascorbic Acid (VITAMIN C) 1000 MG tablet   Oral   Take 1,000 mg by mouth daily.         . B Complex Vitamins (B-COMPLEX/B-12 PO)   Oral   Take by mouth daily.         . cycloSPORINE (RESTASIS) 0.05 % ophthalmic emulsion   Both Eyes   Place 1 drop into both eyes at bedtime as needed (for dry eyes or irritation).         . DIGESTIVE ENZYMES PO   Oral   Take by mouth daily.         Tery Sanfilippo Calcium (STOOL SOFTENER PO)   Oral   Take by mouth as needed.         Marland Kitchen  esomeprazole (NEXIUM) 40 MG capsule   Oral   Take 40 mg by mouth daily before breakfast.         . losartan-hydrochlorothiazide (HYZAAR) 50-12.5 MG per tablet   Oral   Take 1 tablet by mouth daily.          . montelukast (SINGULAIR) 10 MG tablet   Oral   Take 10 mg by mouth at bedtime.         . Multiple Vitamin (MULTIVITAMIN) tablet   Oral   Take 1 tablet by mouth daily.         . Probiotic Product (PROBIOTIC PO)   Oral   Take by mouth daily. Takes 2 capsules daily         . EXPIRED: esomeprazole (NEXIUM) 40 MG capsule   Oral   Take 1 capsule (40 mg total) by mouth daily before breakfast.   30 capsule   3    BP 136/91  Pulse 79  Temp(Src) 97.9 F (36.6 C) (Oral)  SpO2 99% Physical Exam  Nursing note and vitals reviewed. Constitutional: She is oriented to person, place, and time. She appears well-nourished.  HENT:  Head: Normocephalic.  Eyes: Pupils are equal, round, and reactive to light.  Neck: Normal range of motion.  Cardiovascular: Normal  rate and regular rhythm.   Pulmonary/Chest: Effort normal and breath sounds normal. No respiratory distress. She has no wheezes. She exhibits no tenderness.  Musculoskeletal: She exhibits edema and tenderness.       Hands: Red swelling and erythema Blue area of sting Full ROM, strong radial pulse  Neurological: She is alert and oriented to person, place, and time.  Skin: There is erythema.    ED Course  Procedures (including critical care time) Labs Reviewed - No data to display No results found. No diagnosis found.  MDM  After albuterol/atrovent neb and pepcid  better will watch   Arman Filter, NP 12/29/12 [phone]

## 2012-12-29 NOTE — ED Provider Notes (Signed)
Medical screening examination/treatment/procedure(s) were performed by non-physician practitioner and as supervising physician I was immediately available for consultation/collaboration.  John-Adam Malaquias Lenker, M.D.     John-Adam Fleurette Woolbright, MD 12/29/12 0842 

## 2012-12-29 NOTE — ED Provider Notes (Signed)
Patient care assumed from Earley Favor, FNP at shift change.   Patient endorses marked improvement in her SOB with a breathing treatment. Also states that swelling of soft tissue of L hand greatly improved since arrival with topical ice and Pepcid. Patient well and nontoxic appearing and in no acute or respiratory distress. Appropriate for d/c with PCP follow up for further evaluation of symptoms. Ibuprofen, benadryl, zantac, and topical ice packs recommended for symptoms. Indications for ED return discussed with the patient verbalizes comfort and understanding with this discharge plan.   Filed Vitals:   12/29/12 0615 12/29/12 0630 12/29/12 0645 12/29/12 0655  BP:  131/70  128/71  Pulse: 56 59 57 56  Temp:    97.8 F (36.6 C)  TempSrc:    Oral  Resp:    20  SpO2: 100% 99% 98% 99%     Antony Madura, PA-C 12/29/12 0701

## 2012-12-29 NOTE — ED Notes (Signed)
Patient states "I was stung by a bee last night at 8:30pm last night, she woke up at 4:30 this am and she reports that she is having ringing in her ears on the left, and soreness in her neck. Patient has swelling to her left hand and wrist

## 2012-12-29 NOTE — ED Provider Notes (Signed)
Medical screening examination/treatment/procedure(s) were conducted as a shared visit with non-physician practitioner(s) and myself.  I personally evaluated the patient during the encounter Jones Skene, M.D.  [name] is a 77 y.o. female seen initially by PA, patient says she was stung by a small flying insect and within a "yellow jackets", this did not leave a stinger in her hand, says that she's had some pain and swelling to the left hand, during the course of the next hour she experienced some shortness of breath, she does have asthma, she has taken Benadryl and her albuterol inhaler with some relief.  Symptoms have been moderate, consistent, still having pain in the left hand. Patient is nontoxic, afebrile, vital signs are stable and within normal limits, patient has some swelling to the left hand without any apparent puncture site, this is mildly tender to palpation, mild erythema. This does not extend up the arm. Patient's lungs scattered wheezes anteriorly and posteriorly, abdomen is soft nontender to palpation, heart exam reveals regular rate and rhythm without murmurs rubs or gallops. No peripheral edema seen. Patient's airway is patent, no tongue swelling, no stridor, trachea is midline. VITAL SIGNS:   Filed Vitals:   12/29/12 0655  BP: 128/71  Pulse: 56  Temp: 97.8 F (36.6 C)  Resp: 20   No results found. Labs Reviewed - No data to display Medications  ipratropium (ATROVENT) nebulizer solution 0.5 mg (0.5 mg Nebulization Given 12/29/12 0459)    And  albuterol (PROVENTIL) (5 MG/ML) 0.5% nebulizer solution 5 mg (5 mg Nebulization Given 12/29/12 0458)  lidocaine-prilocaine (EMLA) cream ( Topical Given 12/29/12 0650)  ibuprofen (ADVIL,MOTRIN) tablet 600 mg (600 mg Oral Given 12/29/12 0458)  famotidine (PEPCID) tablet 20 mg (20 mg Oral Given 12/29/12 0500)    MDM: Treat patient's symptoms, topical anesthetic cream or sting region, she may have a mild asthma exacerbation versus  under treatment-I have extremely low suspicion that this is an anaphylactic reaction, I do not think she's got a pneumonia. Doubt any other intrathoracic emergency at this time.    Jones Skene, MD 12/29/12 [phone]

## 2013-07-31 ENCOUNTER — Institutional Professional Consult (permissible substitution): Payer: Medicare Other | Admitting: Pulmonary Disease

## 2013-08-28 ENCOUNTER — Other Ambulatory Visit: Payer: Self-pay

## 2013-08-28 ENCOUNTER — Encounter (INDEPENDENT_AMBULATORY_CARE_PROVIDER_SITE_OTHER): Payer: Self-pay

## 2013-08-28 ENCOUNTER — Ambulatory Visit (INDEPENDENT_AMBULATORY_CARE_PROVIDER_SITE_OTHER): Payer: Medicare HMO | Admitting: Pulmonary Disease

## 2013-08-28 ENCOUNTER — Encounter: Payer: Self-pay | Admitting: Pulmonary Disease

## 2013-08-28 VITALS — BP 100/62 | HR 79 | Temp 98.1°F | Ht 59.25 in | Wt 144.6 lb

## 2013-08-28 DIAGNOSIS — J45909 Unspecified asthma, uncomplicated: Secondary | ICD-10-CM

## 2013-08-28 MED ORDER — MOMETASONE FURO-FORMOTEROL FUM 100-5 MCG/ACT IN AERO: 2.0000 | INHALATION_SPRAY | Freq: Two times a day (BID) | RESPIRATORY_TRACT | Status: DC

## 2013-08-28 NOTE — Patient Instructions (Signed)
Will start on dulera 100/5, 2 inhalations every am and pm no matter what.  Use spacer, and my nurse will show you how to use.  Rinse mouth very well, and gargle and spit after using. Use albuterol only for rescue. followup with me again in 4 weeks.

## 2013-08-28 NOTE — Progress Notes (Signed)
   Subjective:    Patient ID: [name], female    DOB: 08-08-1935, 78 y.o.   MRN: [id number]  HPI The patient is a 78 year old female who I've been asked to see for management of asthma. She has a history of fixed airflow obstruction, and was seen back in 2010. She was started on Symbicort with significant improvement in her breathing, however the patient discontinued this because of headache. Since that time, she has been using only proair as needed, but feels that her breathing has been getting worse. She is now using her rescue inhaler multiple times almost every day. She feels that it is not improving her symptoms of chest tightness, dyspnea on exertion, and cough. She also has a long history of reflux disease, but feels this is fairly well-controlled as far she knows.   Review of Systems  Constitutional: Negative for fever and unexpected weight change.  HENT: Positive for congestion, ear pain and sore throat. Negative for dental problem, nosebleeds, postnasal drip, rhinorrhea, sinus pressure, sneezing and trouble swallowing.   Eyes: Negative for redness and itching.  Respiratory: Positive for cough and shortness of breath. Negative for chest tightness and wheezing.   Cardiovascular: Negative for palpitations and leg swelling.  Gastrointestinal: Positive for abdominal pain. Negative for nausea and vomiting.       Acid heartburn//indigestion  Genitourinary: Negative for dysuria.  Musculoskeletal: Positive for arthralgias. Negative for joint swelling.  Skin: Negative for rash.  Neurological: Negative for headaches.  Hematological: Does not bruise/bleed easily.  Psychiatric/Behavioral: Negative for dysphoric mood. The patient is not nervous/anxious.        Objective:   Physical Exam Constitutional:  Well developed, no acute distress  HENT:  Nares patent without discharge  Oropharynx without exudate, palate and uvula are normal  Eyes:  Perrla, eomi, no scleral  icterus  Neck:  No JVD, no TMG  Cardiovascular:  Normal rate, regular rhythm, no rubs or gallops.  No murmurs        Intact distal pulses  Pulmonary :  Normal breath sounds, no stridor or respiratory distress   No rales, rhonchi, or wheezing  Abdominal:  Soft, nondistended, bowel sounds present.  No tenderness noted.   Musculoskeletal:  minimal lower extremity edema noted.  Lymph Nodes:  No cervical lymphadenopathy noted  Skin:  No cyanosis noted  Neurologic:  Alert, appropriate, moves all 4 extremities without obvious deficit.         Assessment & Plan:

## 2013-08-28 NOTE — Assessment & Plan Note (Signed)
The patient has a history of asthma with some degree of fixed airflow obstruction, and currently is having increased symptoms. She is not on an inhaled corticosteroid, and I will start her on a more aggressive asthma regimen to see if her symptoms improve. I stressed to her the importance of treating the inflammation of asthma, and this will require daily medication that contains an inhaled steroid for life.  She has had questionable tolerance issues with Symbicort in the past, and therefore will try her on dulera. The patient also has a known history of significant reflux, and this can contribute to a lot of her symptoms.

## 2013-09-25 ENCOUNTER — Ambulatory Visit: Payer: Medicare HMO | Admitting: Pulmonary Disease

## 2013-10-14 ENCOUNTER — Encounter: Payer: Self-pay | Admitting: Pulmonary Disease

## 2013-10-14 ENCOUNTER — Ambulatory Visit (INDEPENDENT_AMBULATORY_CARE_PROVIDER_SITE_OTHER): Payer: Commercial Managed Care - HMO | Admitting: Pulmonary Disease

## 2013-10-14 VITALS — BP 110/70 | HR 64 | Temp 98.7°F | Ht 59.25 in | Wt 143.2 lb

## 2013-10-14 DIAGNOSIS — J45909 Unspecified asthma, uncomplicated: Secondary | ICD-10-CM

## 2013-10-14 NOTE — Progress Notes (Signed)
   Subjective:    Patient ID: [name], female    DOB: 1935/11/15, 78 y.o.   MRN: [id number]  HPI The patient comes in today for followup of her known asthma. She was started back on inhaled corticosteroids with LABA, and has seen considerable improvement in her breathing. She feels that she is back to her normal baseline, and has not required her rescue inhaler. She has had some mild thrush from rinsing as well, and I have gone over this with her again. She is also having issues with the cost of her medication, and we will try to help her with this.   Review of Systems  Constitutional: Negative for fever and unexpected weight change.  HENT: Negative for congestion, dental problem, ear pain, nosebleeds, postnasal drip, rhinorrhea, sinus pressure, sneezing, sore throat and trouble swallowing.   Eyes: Negative for redness and itching.  Respiratory: Negative for cough, chest tightness, shortness of breath and wheezing.   Cardiovascular: Negative for palpitations and leg swelling.  Gastrointestinal: Negative for nausea and vomiting.  Genitourinary: Negative for dysuria.  Musculoskeletal: Negative for joint swelling.  Skin: Negative for rash.  Neurological: Negative for headaches.  Hematological: Does not bruise/bleed easily.  Psychiatric/Behavioral: Negative for dysphoric mood. The patient is not nervous/anxious.        Objective:   Physical Exam Well-developed female in no acute distress Nose without purulence or discharge noted Neck without lymphadenopathy or thyromegaly Chest with totally clear breath sounds, no wheezing Cardiac exam with regular rate and rhythm Lower extremities without edema, no cyanosis Alert and oriented, moves all 4 extremities.       Assessment & Plan:

## 2013-10-14 NOTE — Patient Instructions (Signed)
Stay on dulera, and keep mouth rinsed well as we discussed. Can check and see if advair 250/50 is less expensive on your plan.  If not, we can give you paperwork to apply for the patient assistance program for dulera. followup with me again in 66mos if you are doing well, but call if having issues.

## 2013-10-14 NOTE — Assessment & Plan Note (Signed)
The patient has chronic obstructive asthma, and has done much better since being on dulera. I've asked her to continue this for now, and we'll work on improved rinsing. She will check her insurance formulary plan to see if the Advair is better covered, and if not, we can see if she qualifies for the patient assistance program.

## 2014-02-04 ENCOUNTER — Telehealth: Payer: Self-pay | Admitting: Pulmonary Disease

## 2014-02-04 MED ORDER — MOMETASONE FURO-FORMOTEROL FUM 100-5 MCG/ACT IN AERO
2.0000 | INHALATION_SPRAY | Freq: Two times a day (BID) | RESPIRATORY_TRACT | Status: DC
Start: 2014-02-04 — End: 2015-04-02

## 2014-02-04 NOTE — Telephone Encounter (Signed)
[id number] x1 

## 2014-02-04 NOTE — Telephone Encounter (Signed)
Ok to give her one box, but we also need to check on the status of her pt assistance forms.  Thanks.

## 2014-02-04 NOTE — Telephone Encounter (Signed)
Called and spoke with pt and she is aware of the sample that has been left up front.  Pt stated that she mailed her pt assistance forms back to [place] back in June. [place] have you seen these forms or know anything about this.  Please help.  thanks

## 2014-02-04 NOTE — Telephone Encounter (Signed)
Called and spoke with pt and she stated that she is needing samples of the dulera.  She did fill out the pt assistance forms and she mailed these back to our office.  She is out of samples and is wanting to come by and pick some of these up.  [place] please advise.  Thanks  Allergies  Allergen Reactions  . Cepacol Shortness Of Breath    Difficulty breathing  . Lactose Intolerance (Gi) Shortness Of Breath    All dairy  Causes shortness of breath  . Penicillins Shortness Of Breath and Swelling    Tongue and eyes swell    Current Outpatient Prescriptions on File Prior to Visit  Medication Sig Dispense Refill  . albuterol (PROVENTIL) (2.5 MG/3ML) 0.083% nebulizer solution Take 2.5 mg by nebulization every 6 (six) hours as needed for wheezing.      . Alpha-D-Galactosidase (BEANO PO) Take 1 tablet by mouth as needed (gas and bloating).       . Ascorbic Acid (VITAMIN C) 1000 MG tablet Take 1,000 mg by mouth daily.      . B Complex Vitamins (B-COMPLEX/B-12 PO) Take by mouth daily.      . cycloSPORINE (RESTASIS) 0.05 % ophthalmic emulsion Place 1 drop into both eyes at bedtime as needed (for dry eyes or irritation).      . diphenhydrAMINE (BENADRYL) 25 MG tablet Take 1 tablet (25 mg total) by mouth every 6 (six) hours as needed for itching (Rash).  30 tablet  0  . Docusate Calcium (STOOL SOFTENER PO) Take by mouth as needed.      Marland Kitchen esomeprazole (NEXIUM) 20 MG capsule Take 20 mg by mouth daily at 12 noon.      Marland Kitchen losartan-hydrochlorothiazide (HYZAAR) 50-12.5 MG per tablet Take 1 tablet by mouth daily.       . mometasone-formoterol (DULERA) 100-5 MCG/ACT AERO Inhale 2 puffs into the lungs 2 (two) times daily.  1 Inhaler  6  . montelukast (SINGULAIR) 10 MG tablet Take 10 mg by mouth at bedtime.      . Multiple Vitamin (MULTIVITAMIN) tablet Take 1 tablet by mouth daily.      . Probiotic Product (PROBIOTIC PO) Take by mouth daily. Takes 2 capsules daily       No current facility-administered medications on  file prior to visit.

## 2014-02-06 NOTE — Telephone Encounter (Signed)
I spoke with the pt and advised we do not have the forms and that the best thing to do is bring the forms by the office, not  Mail them. The pt states that she still has a copy of the form sand will bring them by the office on MOnday. I will await forms. [place] Bing, CMA

## 2014-02-10 NOTE — Telephone Encounter (Signed)
Pt left form for [place] to fill out for pt assistance forms.

## 2014-02-11 NOTE — Telephone Encounter (Signed)
Forms completed and mailed to [employer]. Copy of forms kept in case there are issues at my desk. [place] Bing, CMA

## 2014-02-25 ENCOUNTER — Ambulatory Visit (INDEPENDENT_AMBULATORY_CARE_PROVIDER_SITE_OTHER): Payer: Commercial Managed Care - HMO | Admitting: Pulmonary Disease

## 2014-02-25 ENCOUNTER — Encounter: Payer: Self-pay | Admitting: Pulmonary Disease

## 2014-02-25 VITALS — BP 122/72 | HR 62 | Temp 98.0°F | Ht 59.25 in | Wt 144.4 lb

## 2014-02-25 DIAGNOSIS — J4541 Moderate persistent asthma with (acute) exacerbation: Secondary | ICD-10-CM

## 2014-02-25 DIAGNOSIS — J45909 Unspecified asthma, uncomplicated: Secondary | ICD-10-CM

## 2014-02-25 DIAGNOSIS — J45901 Unspecified asthma with (acute) exacerbation: Secondary | ICD-10-CM

## 2014-02-25 MED ORDER — PREDNISONE 10 MG PO TABS: ORAL_TABLET | ORAL | Status: DC

## 2014-02-25 MED ORDER — ALBUTEROL SULFATE 108 (90 BASE) MCG/ACT IN AEPB: 2.0000 | INHALATION_SPRAY | Freq: Four times a day (QID) | RESPIRATORY_TRACT | Status: DC | PRN

## 2014-02-25 NOTE — Assessment & Plan Note (Signed)
The patient's history is most consistent with an acute asthma exacerbation associated with exposure to some type of mold. She has improved, but has not returned to baseline. I would like to treat her with a short course of prednisone to help her with this, and if she does not return to baseline, will check a chest x-ray. I've also stressed to her the need to have an immediate rescue inhaler, and will provide a prescription today.

## 2014-02-25 NOTE — Patient Instructions (Signed)
Stay on dulera 2 puffs twice a day.  Keep mouth rinsed out. Will get you a prescription for proair respiclick.  Use as a rescue inhaler, and take up to 2 puffs every 6 hrs if needed for emergencies. Will treat with an 8 day course of prednisone.  If you do not return to your usual baseline, will check chest xray. followup with me again in 33mos, but call if you are not doing well.

## 2014-02-25 NOTE — Progress Notes (Signed)
   Subjective:    Patient ID: [name], female    DOB: 03/06/1936, 78 y.o.   MRN: [id number]  HPI The patient comes in today for an acute sick visit. I follow her for asthma, and she has done well on dulera. She was exposed approximately 3 days ago to an environment with black mole, and quickly began to develop increasing cough, congestion, discolored mucus, and increased shortness of breath with wheezing. She did not have a rescue inhaler with her, and went home and used her nebulizer and dulera with some relief. She has slowly improved since that incident, but continues to feel that her shortness of breath has not returned to baseline. She has had some dry cough as well, but a lot of postnasal drip.   Review of Systems  Constitutional: Negative for fever and unexpected weight change.  HENT: Positive for congestion, postnasal drip, rhinorrhea, sinus pressure, sneezing and sore throat. Negative for dental problem, ear pain, nosebleeds and trouble swallowing.   Eyes: Negative for redness and itching.  Respiratory: Positive for cough and shortness of breath. Negative for chest tightness and wheezing.   Cardiovascular: Negative for palpitations and leg swelling.  Gastrointestinal: Negative for nausea and vomiting.  Genitourinary: Negative for dysuria.  Musculoskeletal: Negative for joint swelling.  Skin: Negative for rash.  Neurological: Negative for headaches.  Hematological: Does not bruise/bleed easily.  Psychiatric/Behavioral: Negative for dysphoric mood. The patient is not nervous/anxious.        Objective:   Physical Exam Well-developed female in no acute distress Nose without purulence or discharge noted Neck without lymphadenopathy or thyromegaly Chest totally clear to auscultation, no wheezing Cardiac exam with regular rate and rhythm Lower extremities with minimal edema, no cyanosis Alert and oriented, moves all 4 extremities.       Assessment & Plan:

## 2014-02-25 NOTE — Addendum Note (Signed)
Addended by: Inge Rise on: 02/25/2014 03:08 PM   Modules accepted: Orders

## 2014-03-13 ENCOUNTER — Emergency Department (HOSPITAL_COMMUNITY): Payer: Medicare HMO

## 2014-03-13 ENCOUNTER — Encounter (HOSPITAL_COMMUNITY): Payer: Self-pay | Admitting: Emergency Medicine

## 2014-03-13 ENCOUNTER — Observation Stay (HOSPITAL_COMMUNITY)
Admission: EM | Admit: 2014-03-13 | Discharge: 2014-03-15 | Disposition: A | Discharge status: Home / Self Care | Payer: Medicare HMO | Attending: Internal Medicine | Admitting: Internal Medicine

## 2014-03-13 DIAGNOSIS — E669 Obesity, unspecified: Secondary | ICD-10-CM | POA: Diagnosis not present

## 2014-03-13 DIAGNOSIS — R4182 Altered mental status, unspecified: Secondary | ICD-10-CM

## 2014-03-13 DIAGNOSIS — K219 Gastro-esophageal reflux disease without esophagitis: Secondary | ICD-10-CM | POA: Insufficient documentation

## 2014-03-13 DIAGNOSIS — J45909 Unspecified asthma, uncomplicated: Secondary | ICD-10-CM | POA: Diagnosis not present

## 2014-03-13 DIAGNOSIS — R531 Weakness: Secondary | ICD-10-CM

## 2014-03-13 DIAGNOSIS — I10 Essential (primary) hypertension: Secondary | ICD-10-CM | POA: Diagnosis not present

## 2014-03-13 DIAGNOSIS — K5900 Constipation, unspecified: Secondary | ICD-10-CM | POA: Diagnosis not present

## 2014-03-13 DIAGNOSIS — R51 Headache: Secondary | ICD-10-CM

## 2014-03-13 DIAGNOSIS — IMO0002 Reserved for concepts with insufficient information to code with codable children: Secondary | ICD-10-CM | POA: Insufficient documentation

## 2014-03-13 DIAGNOSIS — F3289 Other specified depressive episodes: Secondary | ICD-10-CM | POA: Diagnosis not present

## 2014-03-13 DIAGNOSIS — G8929 Other chronic pain: Secondary | ICD-10-CM | POA: Diagnosis not present

## 2014-03-13 DIAGNOSIS — G459 Transient cerebral ischemic attack, unspecified: Secondary | ICD-10-CM | POA: Diagnosis not present

## 2014-03-13 DIAGNOSIS — Z87891 Personal history of nicotine dependence: Secondary | ICD-10-CM | POA: Insufficient documentation

## 2014-03-13 DIAGNOSIS — Z6830 Body mass index (BMI) 30.0-30.9, adult: Secondary | ICD-10-CM | POA: Insufficient documentation

## 2014-03-13 DIAGNOSIS — R011 Cardiac murmur, unspecified: Secondary | ICD-10-CM | POA: Diagnosis not present

## 2014-03-13 DIAGNOSIS — R209 Unspecified disturbances of skin sensation: Secondary | ICD-10-CM

## 2014-03-13 DIAGNOSIS — E785 Hyperlipidemia, unspecified: Secondary | ICD-10-CM | POA: Diagnosis not present

## 2014-03-13 DIAGNOSIS — F329 Major depressive disorder, single episode, unspecified: Secondary | ICD-10-CM | POA: Diagnosis not present

## 2014-03-13 DIAGNOSIS — R29898 Other symptoms and signs involving the musculoskeletal system: Secondary | ICD-10-CM | POA: Diagnosis present

## 2014-03-13 DIAGNOSIS — M129 Arthropathy, unspecified: Secondary | ICD-10-CM | POA: Insufficient documentation

## 2014-03-13 LAB — URINALYSIS, ROUTINE W REFLEX MICROSCOPIC
BILIRUBIN URINE: NEGATIVE
Glucose, UA: NEGATIVE mg/dL
KETONES UR: NEGATIVE mg/dL
Leukocytes, UA: NEGATIVE
NITRITE: NEGATIVE
Protein, ur: NEGATIVE mg/dL
Specific Gravity, Urine: 1.005 (ref 1.005–1.030)
UROBILINOGEN UA: 0.2 mg/dL (ref 0.0–1.0)
pH: 7 (ref 5.0–8.0)

## 2014-03-13 LAB — DIFFERENTIAL
BASOS ABS: 0 10*3/uL (ref 0.0–0.1)
Basophils Relative: 1 % (ref 0–1)
EOS PCT: 2 % (ref 0–5)
Eosinophils Absolute: 0.1 10*3/uL (ref 0.0–0.7)
LYMPHS ABS: 2 10*3/uL (ref 0.7–4.0)
LYMPHS PCT: 47 % — AB (ref 12–46)
MONO ABS: 0.4 10*3/uL (ref 0.1–1.0)
MONOS PCT: 9 % (ref 3–12)
Neutro Abs: 1.7 10*3/uL (ref 1.7–7.7)
Neutrophils Relative %: 41 % — ABNORMAL LOW (ref 43–77)

## 2014-03-13 LAB — COMPREHENSIVE METABOLIC PANEL
ALT: 14 U/L (ref 0–35)
ANION GAP: 14 (ref 5–15)
AST: 19 U/L (ref 0–37)
Albumin: 3.9 g/dL (ref 3.5–5.2)
Alkaline Phosphatase: 59 U/L (ref 39–117)
BILIRUBIN TOTAL: 0.5 mg/dL (ref 0.3–1.2)
BUN: 21 mg/dL (ref 6–23)
CALCIUM: 9.5 mg/dL (ref 8.4–10.5)
CO2: 26 meq/L (ref 19–32)
CREATININE: 0.98 mg/dL (ref 0.50–1.10)
Chloride: 99 mEq/L (ref 96–112)
GFR, EST AFRICAN AMERICAN: 63 mL/min — AB (ref >90)
GFR, EST NON AFRICAN AMERICAN: 55 mL/min — AB (ref >90)
GLUCOSE: 78 mg/dL (ref 70–99)
Potassium: 3.8 mEq/L (ref 3.7–5.3)
Sodium: 139 mEq/L (ref 137–147)
Total Protein: 7.6 g/dL (ref 6.0–8.3)

## 2014-03-13 LAB — RAPID URINE DRUG SCREEN, HOSP PERFORMED
Amphetamines: NOT DETECTED
BARBITURATES: NOT DETECTED
Benzodiazepines: NOT DETECTED
Cocaine: NOT DETECTED
Opiates: NOT DETECTED
TETRAHYDROCANNABINOL: NOT DETECTED

## 2014-03-13 LAB — CBC
HEMATOCRIT: 40.8 % (ref 36.0–46.0)
HEMOGLOBIN: 13.6 g/dL (ref 12.0–15.0)
MCH: 30.2 pg (ref 26.0–34.0)
MCHC: 33.3 g/dL (ref 30.0–36.0)
MCV: 90.7 fL (ref 78.0–100.0)
Platelets: 236 10*3/uL (ref 150–400)
RBC: 4.5 MIL/uL (ref 3.87–5.11)
RDW: 13.1 % (ref 11.5–15.5)
WBC: 4.2 10*3/uL (ref 4.0–10.5)

## 2014-03-13 LAB — PROTIME-INR
INR: 1.15 (ref 0.00–1.49)
Prothrombin Time: 14.7 seconds (ref 11.6–15.2)

## 2014-03-13 LAB — URINE MICROSCOPIC-ADD ON

## 2014-03-13 LAB — I-STAT TROPONIN, ED: TROPONIN I, POC: 0 ng/mL (ref 0.00–0.08)

## 2014-03-13 LAB — APTT: aPTT: 30 seconds (ref 24–37)

## 2014-03-13 MED ORDER — ASPIRIN EC 81 MG PO TBEC
81.0000 mg | DELAYED_RELEASE_TABLET | Freq: Every day | ORAL | Status: DC
  Administered 2014-03-13: 81 mg via ORAL
  Filled 2014-03-13 (×2): qty 1

## 2014-03-13 MED ORDER — PROBIOTIC PO CAPS: 2.0000 | ORAL_CAPSULE | Freq: Every morning | ORAL | Status: DC

## 2014-03-13 MED ORDER — SACCHAROMYCES BOULARDII 250 MG PO CAPS
500.0000 mg | ORAL_CAPSULE | Freq: Every morning | ORAL | Status: DC
  Administered 2014-03-14 – 2014-03-15 (×2): 500 mg via ORAL
  Filled 2014-03-13 (×2): qty 2

## 2014-03-13 MED ORDER — DOCUSATE SODIUM 100 MG PO CAPS
100.0000 mg | ORAL_CAPSULE | Freq: Two times a day (BID) | ORAL | Status: DC
  Administered 2014-03-13 – 2014-03-15 (×4): 100 mg via ORAL
  Filled 2014-03-13 (×4): qty 1

## 2014-03-13 MED ORDER — MOMETASONE FURO-FORMOTEROL FUM 100-5 MCG/ACT IN AERO
2.0000 | INHALATION_SPRAY | Freq: Two times a day (BID) | RESPIRATORY_TRACT | Status: DC
  Administered 2014-03-13 – 2014-03-14 (×2): 2 via RESPIRATORY_TRACT
  Filled 2014-03-13: qty 8.8

## 2014-03-13 MED ORDER — ASPIRIN 325 MG PO TABS
325.0000 mg | ORAL_TABLET | Freq: Every day | ORAL | Status: DC
  Administered 2014-03-14 – 2014-03-15 (×2): 325 mg via ORAL
  Filled 2014-03-13 (×2): qty 1

## 2014-03-13 MED ORDER — ALBUTEROL SULFATE (2.5 MG/3ML) 0.083% IN NEBU: 2.5000 mg | INHALATION_SOLUTION | Freq: Four times a day (QID) | RESPIRATORY_TRACT | Status: DC | PRN

## 2014-03-13 MED ORDER — STROKE: EARLY STAGES OF RECOVERY BOOK
Freq: Once | Status: AC
  Administered 2014-03-13: 23:00:00
  Filled 2014-03-13: qty 1

## 2014-03-13 MED ORDER — ACETAMINOPHEN 325 MG PO TABS
650.0000 mg | ORAL_TABLET | ORAL | Status: DC | PRN
Start: 2014-03-13 — End: 2014-03-15
  Administered 2014-03-14 (×3): 650 mg via ORAL
  Filled 2014-03-13 (×3): qty 2

## 2014-03-13 MED ORDER — CYCLOSPORINE 0.05 % OP EMUL: 1.0000 [drp] | Freq: Every evening | OPHTHALMIC | Status: DC | PRN

## 2014-03-13 MED ORDER — HYDROCHLOROTHIAZIDE 12.5 MG PO CAPS
12.5000 mg | ORAL_CAPSULE | Freq: Every day | ORAL | Status: DC
  Administered 2014-03-14 – 2014-03-15 (×2): 12.5 mg via ORAL
  Filled 2014-03-13 (×3): qty 1

## 2014-03-13 MED ORDER — ENOXAPARIN SODIUM 40 MG/0.4ML ~~LOC~~ SOLN
40.0000 mg | Freq: Every day | SUBCUTANEOUS | Status: DC
  Administered 2014-03-13 – 2014-03-14 (×2): 40 mg via SUBCUTANEOUS
  Filled 2014-03-13 (×2): qty 0.4

## 2014-03-13 MED ORDER — MONTELUKAST SODIUM 10 MG PO TABS
10.0000 mg | ORAL_TABLET | Freq: Every day | ORAL | Status: DC
  Administered 2014-03-13 – 2014-03-14 (×2): 10 mg via ORAL
  Filled 2014-03-13 (×2): qty 1

## 2014-03-13 MED ORDER — LOSARTAN POTASSIUM-HCTZ 50-12.5 MG PO TABS: 1.0000 | ORAL_TABLET | Freq: Every day | ORAL | Status: DC

## 2014-03-13 MED ORDER — ALBUTEROL SULFATE 108 (90 BASE) MCG/ACT IN AEPB: 2.0000 | INHALATION_SPRAY | Freq: Four times a day (QID) | RESPIRATORY_TRACT | Status: DC | PRN

## 2014-03-13 MED ORDER — PANTOPRAZOLE SODIUM 40 MG PO TBEC
40.0000 mg | DELAYED_RELEASE_TABLET | Freq: Every day | ORAL | Status: DC
  Administered 2014-03-14 – 2014-03-15 (×2): 40 mg via ORAL
  Filled 2014-03-13 (×2): qty 1

## 2014-03-13 MED ORDER — LOSARTAN POTASSIUM 50 MG PO TABS
50.0000 mg | ORAL_TABLET | Freq: Every day | ORAL | Status: DC
  Administered 2014-03-14 – 2014-03-15 (×2): 50 mg via ORAL
  Filled 2014-03-13 (×2): qty 1

## 2014-03-13 MED ORDER — DIPHENHYDRAMINE HCL 25 MG PO TABS
25.0000 mg | ORAL_TABLET | Freq: Four times a day (QID) | ORAL | Status: DC | PRN
  Filled 2014-03-13: qty 1

## 2014-03-13 NOTE — ED Notes (Signed)
Phlebotomy at bedside. While in triage pt was stuck but they were unable to get enough blood for all labs.

## 2014-03-13 NOTE — ED Notes (Signed)
Pharmacy tech at bedside 

## 2014-03-13 NOTE — ED Provider Notes (Signed)
I saw and evaluated the patient, reviewed the resident's note and I agree with the findings and plan.   .Face to face Exam:  General:  Awake HEENT:  Atraumatic Resp:  Normal effort Abd:  Nondistended Neuro:No focal weakness Lymph: No adenopathy EKG was discussed and reviewed with resident   Dot Lanes, MD 03/13/14 [phone]

## 2014-03-13 NOTE — ED Notes (Signed)
Transporting patient to new room assignment. 

## 2014-03-13 NOTE — ED Notes (Signed)
Patient transported to CT 

## 2014-03-13 NOTE — ED Notes (Signed)
This am while driving [id number] she had lt arm and leg numbness with a headache.  The numbness is getting better.  Hx of the  These same symptoms.

## 2014-03-13 NOTE — ED Notes (Signed)
The pt was sent here from a doctors office that told her that she had a mild stroke

## 2014-03-13 NOTE — ED Provider Notes (Signed)
CSN: [id number]     Arrival date & time 03/13/14  1648 History   First MD Initiated Contact with Patient 03/13/14 1718     Chief Complaint  Patient presents with  . Numbness     (Consider location/radiation/quality/duration/timing/severity/associated sxs/prior Treatment) Patient is a 78 y.o. female presenting with neurologic complaint. The history is provided by the patient and medical records.  Neurologic Problem This is a new problem. The current episode started today. The problem occurs constantly. The problem has been resolved. Associated symptoms include numbness and weakness (per PCP note she had L arm and legw eakness, patient is unsure). Pertinent negatives include no abdominal pain, chest pain, congestion, coughing, fatigue, fever, nausea, rash, visual change or vomiting. Associated symptoms comments: Pt states while driving today about 9833 she had sudden onset L arm and L leg numbness and felt disoriented. Had to pull over. States lasted about 10 min then resolved. Denies slurred speech, change in vision. Unwitnessed. Went to PCP who sent her here.. Nothing aggravates the symptoms. She has tried nothing for the symptoms. Improvement on treatment: no improvement on specific tx but self resolved in 10 minutes.    Past Medical History  Diagnosis Date  . Hypertension   . H/O seasonal allergies   . Asthma   . GERD (gastroesophageal reflux disease)   . Hx of colonic polyp   . Hemorrhoids, internal   . Hyperlipemia   . Asthma   . Allergy   . Arthritis   . Cataract   . Depression   . Heart murmur   . Diverticulosis 10-2011    Colonoscopy  . Hemorrhoids 10-2011    Colonoscopy  . 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    EGD   Past Surgical History  Procedure Laterality Date  . Cesarean section  1962  . Laparoscopic cholecystectomy  1995   Family History  Problem Relation Age of Onset  . Colon cancer Sister 46  . Stomach cancer Neg Hx   . Breast cancer      Niece  . Irritable  bowel syndrome Sister    History  Substance Use Topics  . Smoking status: Former Smoker -- 0.50 packs/day    Types: Cigarettes    Quit date: 09/28/1959  . Smokeless tobacco: Never Used  . Alcohol Use: No   OB History   Grav Para Term Preterm Abortions TAB SAB Ect Mult Living                 Review of Systems  Constitutional: Negative for fever, activity change, appetite change and fatigue.  HENT: Negative for congestion and rhinorrhea.   Eyes: Negative for discharge, redness and itching.  Respiratory: Negative for cough, shortness of breath and wheezing.   Cardiovascular: Negative for chest pain.  Gastrointestinal: Negative for nausea, vomiting, abdominal pain and diarrhea.  Genitourinary: Negative for dysuria and hematuria.  Musculoskeletal: Negative for back pain.  Skin: Negative for rash and wound.  Neurological: Positive for weakness (per PCP note she had L arm and legw eakness, patient is unsure) and numbness. Negative for syncope.      Allergies  Cepacol; Lactose intolerance (gi); and Penicillins  Home Medications   Prior to Admission medications   Medication Sig Start Date End Date Taking? Authorizing Provider  albuterol (PROVENTIL) (2.5 MG/3ML) 0.083% nebulizer solution Take 2.5 mg by nebulization every 6 (six) hours as needed for wheezing.   Yes Historical Provider, MD  Albuterol Sulfate (PROAIR RESPICLICK) 825 (90 BASE) MCG/ACT AEPB Inhale 2 puffs into  the lungs every 6 (six) hours as needed. 02/25/14  Yes Kathee Delton, MD  Alpha-D-Galactosidase (BEANO PO) Take 1 tablet by mouth as needed (gas and bloating).    Yes Historical Provider, MD  Ascorbic Acid (VITAMIN C) 1000 MG tablet Take 1,000 mg by mouth daily.   Yes Historical Provider, MD  B Complex Vitamins (B-COMPLEX/B-12 PO) Take by mouth daily.   Yes Historical Provider, MD  cycloSPORINE (RESTASIS) 0.05 % ophthalmic emulsion Place 1 drop into both eyes at bedtime as needed (for dry eyes or irritation).   Yes  Historical Provider, MD  diphenhydrAMINE (BENADRYL) 25 MG tablet Take 1 tablet (25 mg total) by mouth every 6 (six) hours as needed for itching (Rash). 12/29/12  Yes Antonietta Breach, PA-C  Docusate Calcium (STOOL SOFTENER PO) Take by mouth as needed.   Yes Historical Provider, MD  esomeprazole (NEXIUM) 20 MG capsule Take 20 mg by mouth daily at 12 noon.   Yes Historical Provider, MD  losartan-hydrochlorothiazide (HYZAAR) 50-12.5 MG per tablet Take 1 tablet by mouth daily.  09/01/11  Yes Historical Provider, MD  mometasone-formoterol (DULERA) 100-5 MCG/ACT AERO Inhale 2 puffs into the lungs 2 (two) times daily. 02/04/14  Yes Kathee Delton, MD  montelukast (SINGULAIR) 10 MG tablet Take 10 mg by mouth at bedtime.   Yes Historical Provider, MD  Multiple Vitamin (MULTIVITAMIN) tablet Take 1 tablet by mouth daily.   Yes Historical Provider, MD  predniSONE (DELTASONE) 10 MG tablet Take 4 tabs daily x 2 days, 3 tabs daily x 2 days, 2 tabs daily x 2 days, 1 tab daily x 2 days 02/25/14  Yes Kathee Delton, MD  Probiotic Product (PROBIOTIC PO) Take by mouth daily. Takes 2 capsules daily   Yes Historical Provider, MD   BP 123/67  Pulse 78  Temp(Src) 98.6 F (37 C) (Oral)  Resp 18  Ht 4\' 9"  (1.448 m)  Wt 143 lb (64.864 kg)  BMI 30.94 kg/m2  SpO2 99% Physical Exam  Constitutional: She is oriented to person, place, and time. She appears well-developed and well-nourished. No distress.  HENT:  Head: Normocephalic and atraumatic.  Mouth/Throat: Oropharynx is clear and moist.  Eyes: Conjunctivae and EOM are normal. Pupils are equal, round, and reactive to light. Right eye exhibits no discharge. Left eye exhibits no discharge. No scleral icterus.  Neck: Normal range of motion. Neck supple.  Cardiovascular: Normal rate, regular rhythm and intact distal pulses.  Exam reveals no gallop and no friction rub.   No murmur heard. Pulmonary/Chest: Effort normal and breath sounds normal. No respiratory distress. She has no  wheezes. She has no rales.  Abdominal: Soft. She exhibits no distension and no mass. There is no tenderness.  Musculoskeletal: Normal range of motion.  Neurological: She is alert and oriented to person, place, and time. No cranial nerve deficit. She exhibits normal muscle tone. Coordination normal.  CNs II-XII intact. Intact sensationin all exts, 5/5 strength in all exts, F2N negative, no ataxia. 2+ DTRs in patella and brachioradilias b/l  Skin: She is not diaphoretic.    ED Course  Procedures (including critical care time) Labs Review Labs Reviewed  DIFFERENTIAL - Abnormal; Notable for the following:    Neutrophils Relative % 41 (*)    Lymphocytes Relative 47 (*)    All other components within normal limits  COMPREHENSIVE METABOLIC PANEL - Abnormal; Notable for the following:    GFR calc non Af Amer 55 (*)    GFR calc Af Amer 63 (*)  All other components within normal limits  URINALYSIS, ROUTINE W REFLEX MICROSCOPIC - Abnormal; Notable for the following:    Hgb urine dipstick SMALL (*)    All other components within normal limits  PROTIME-INR  APTT  CBC  URINE RAPID DRUG SCREEN (HOSP PERFORMED)  URINE MICROSCOPIC-ADD ON  HEMOGLOBIN A1C  LIPID PANEL  I-STAT TROPOININ, ED    Imaging Review Ct Head (brain) Wo Contrast  03/13/2014   CLINICAL DATA:  Headache with left-sided body numbness today.  EXAM: CT HEAD WITHOUT CONTRAST  TECHNIQUE: Contiguous axial images were obtained from the base of the skull through the vertex without intravenous contrast.  COMPARISON:  Head CT 08/15/2006.  FINDINGS: There is no evidence of acute intracranial hemorrhage, mass lesion, brain edema or extra-axial fluid collection. The ventricles and subarachnoid spaces are appropriately sized for age. There is no CT evidence of acute cortical infarction. Mild intracranial vascular calcifications are noted.  The visualized paranasal sinuses, mastoid air cells and middle ears are clear. The calvarium is intact.   IMPRESSION: Stable head CT.  No acute intracranial findings.   Electronically Signed   By: Camie Patience M.D.   On: 03/13/2014 18:15     EKG Interpretation   Date/Time:  Friday March 13 2014 17:12:58 EDT Ventricular Rate:  70 PR Interval:  166 QRS Duration: 78 QT Interval:  380 QTC Calculation: 410 R Axis:   63 Text Interpretation:  Normal sinus rhythm Normal ECG Confirmed by BEATON   MD, ROBERT ([id number]) on 03/13/2014 5:39:56 PM      MDM   MDM: 78 y.o. AAF w/ PMHx of HTN w/ cc: of TIA. 10 min of disorientation, LLE/LUE numbness. ?weakness. Now resolved. AFVSS, well appearing, NAD, normal neuro exam. Head CT neg, labs neg. Concern for TIA. Neuro consulted, agrees with admission for TIA workup. Hospitalist consulted. Will admit. Pt remained HDS and no change in Neuro exam while in Ed. Admit.   Final diagnoses:  Transient cerebral ischemia, unspecified transient cerebral ischemia type  Intrinsic asthma, unspecified  Unspecified essential hypertension    Admit to Hospitalist  Sol Passer, MD 03/13/14 2332

## 2014-03-13 NOTE — Consult Note (Signed)
Referring Physician: ED    Chief Complaint:  confusion, left sided numbness-pain-weakness  HPI:                                                                                                                                         [name] is an 78 y.o. female with a past medical history significant for HTN, hyperlipidemia, GERD, depression, sent to the ED for further evaluation of the above mentioned symptoms. She said that this morning around 1030 she was driving and suddenly became " confused, did not know where I was", then the left arm and leg started getting numb and heavy, and later on developed pain in the left side and also HA. She said that the left leg still feels numb but other symptoms had resolved. [name] went to see her physician who asked her to come to the ED due to concern for a possible stroke. At this moment she denies HA, vertigo, double vision, slurred speech, difficulty swallowing, language or visual disturbances. No CP, SOB, or palpitations. CT brain tonight showed no acute intracranial abnormality. Date last known well: 03/13/14 Time last known well: 10:30 am tPA Given: no, late presentation, symptoms virtually resolved except mild left leg numbness   Past Medical History  Diagnosis Date  . Hypertension   . H/O seasonal allergies   . Asthma   . GERD (gastroesophageal reflux disease)   . Hx of colonic polyp   . Hemorrhoids, internal   . Hyperlipemia   . Asthma   . Allergy   . Arthritis   . Cataract   . Depression   . Heart murmur   . Diverticulosis 10-2011    Colonoscopy  . Hemorrhoids 10-2011    Colonoscopy  . 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    EGD    Past Surgical History  Procedure Laterality Date  . Cesarean section  1962  . Laparoscopic cholecystectomy  1995    Family History  Problem Relation Age of Onset  . Colon cancer Sister 65  . Stomach cancer Neg Hx   . Breast cancer      Niece  . Irritable bowel syndrome Sister     Social History:  reports that she quit smoking about 54 years ago. Her smoking use included Cigarettes. She smoked 0.50 packs per day. She has never used smokeless tobacco. She reports that she does not drink alcohol or use illicit drugs.  Allergies:  Allergies  Allergen Reactions  . Cepacol Shortness Of Breath    Difficulty breathing  . Lactose Intolerance (Gi) Shortness Of Breath    All dairy  Causes shortness of breath  . Penicillins Shortness Of Breath and Swelling    Tongue and eyes swell    Medications:  I have reviewed the patient's current medications.  ROS:                                                                                                                                       History obtained from the patient and chart review  General ROS: negative for - chills, fatigue, fever, night sweats, weight gain or weight loss Psychological ROS: negative for - behavioral disorder, hallucinations, memory difficulties, mood swings or suicidal ideation Ophthalmic ROS: negative for - blurry vision, double vision, eye pain or loss of vision ENT ROS: negative for - epistaxis, nasal discharge, oral lesions, sore throat, tinnitus or vertigo Allergy and Immunology ROS: negative for - hives or itchy/watery eyes Hematological and Lymphatic ROS: negative for - bleeding problems, bruising or swollen lymph nodes Endocrine ROS: negative for - galactorrhea, hair pattern changes, polydipsia/polyuria or temperature intolerance Respiratory ROS: negative for - cough, hemoptysis, shortness of breath or wheezing Cardiovascular ROS: negative for - chest pain, dyspnea on exertion, edema or irregular heartbeat Gastrointestinal ROS: negative for - abdominal pain, diarrhea, hematemesis, nausea/vomiting or stool incontinence Genito-Urinary ROS: negative for - dysuria, hematuria,  incontinence or urinary frequency/urgency Musculoskeletal ROS: negative for - joint swelling or muscular weakness Neurological ROS: as noted in HPI Dermatological ROS: negative for rash and skin lesion changes  Physical exam: pleasant female in no apparent distress. Blood pressure 106/75, pulse 71, temperature 98.5 F (36.9 C), temperature source Oral, resp. rate 18, height _0  (1.448 m), weight 64.864 kg (143 lb), SpO2 97.00%. Head: normocephalic. Neck: supple, no bruits, no JVD. Cardiac: no murmurs. Lungs: clear. Abdomen: soft, no tender, no mass. Extremities: no edema. Neurologic Examination:                                                                                                      General: Mental Status: Alert, oriented, thought content appropriate.  Speech fluent without evidence of aphasia.  Able to follow 3 step commands without difficulty. Cranial Nerves: II: Discs flat bilaterally; Visual fields grossly normal, pupils equal, round, reactive to light and accommodation III,IV, VI: ptosis not present, extra-ocular motions intact bilaterally V,VII: smile symmetric, facial light touch sensation normal bilaterally VIII: hearing normal bilaterally IX,X: gag reflex present XI: bilateral shoulder shrug XII: midline tongue extension without atrophy or fasciculations  Motor: Right : Upper extremity   5/5    Left:     Upper extremity   5/5  Lower extremity   5/5  Lower extremity   5/5 Tone and bulk:normal tone throughout; no atrophy noted Sensory: Pinprick and light touch intact mildly diminished left arm and leg Deep Tendon Reflexes:  Right: Upper Extremity   Left: Upper extremity   biceps (C-5 to C-6) 2/4   biceps (C-5 to C-6) 2/4 tricep (C7) 2/4    triceps (C7) 2/4 Brachioradialis (C6) 2/4  Brachioradialis (C6) 2/4  Lower Extremity Lower Extremity  quadriceps (L-2 to L-4) 2/4   quadriceps (L-2 to L-4) 2/4 Achilles (S1) 2/4   Achilles (S1)  2/4  Plantars: Right: downgoing   Left: downgoing Cerebellar: normal finger-to-nose,  normal heel-to-shin test Gait:  No tested      Results for orders placed during the hospital encounter of 03/13/14 (from the past 48 hour(s))  PROTIME-INR     Status: None   Collection Time    03/13/14  5:06 PM      Result Value Ref Range   Prothrombin Time 14.7  11.6 - 15.2 seconds   INR 1.15  0.00 - 1.49  APTT     Status: None   Collection Time    03/13/14  5:06 PM      Result Value Ref Range   aPTT 30  24 - 37 seconds  COMPREHENSIVE METABOLIC PANEL     Status: Abnormal   Collection Time    03/13/14  5:06 PM      Result Value Ref Range   Sodium 139  137 - 147 mEq/L   Potassium 3.8  3.7 - 5.3 mEq/L   Chloride 99  96 - 112 mEq/L   CO2 26  19 - 32 mEq/L   Glucose, Bld 78  70 - 99 mg/dL   BUN 21  6 - 23 mg/dL   Creatinine, Ser 0.98  0.50 - 1.10 mg/dL   Calcium 9.5  8.4 - 10.5 mg/dL   Total Protein 7.6  6.0 - 8.3 g/dL   Albumin 3.9  3.5 - 5.2 g/dL   AST 19  0 - 37 U/L   ALT 14  0 - 35 U/L   Alkaline Phosphatase 59  39 - 117 U/L   Total Bilirubin 0.5  0.3 - 1.2 mg/dL   GFR calc non Af Amer 55 (*) >90 mL/min   GFR calc Af Amer 63 (*) >90 mL/min   Comment: (NOTE)     The eGFR has been calculated using the CKD EPI equation.     This calculation has not been validated in all clinical situations.     eGFR's persistently <90 mL/min signify possible Chronic Kidney     Disease.   Anion gap 14  5 - 15  I-STAT TROPOININ, ED     Status: None   Collection Time    03/13/14  5:40 PM      Result Value Ref Range   Troponin i, poc 0.00  0.00 - 0.08 ng/mL   Comment 3            Comment: Due to the release kinetics of cTnI,     a negative result within the first hours     of the onset of symptoms does not rule out     myocardial infarction with certainty.     If myocardial infarction is still suspected,     repeat the test at appropriate intervals.  CBC     Status: None   Collection Time     03/13/14  5:41 PM      Result Value Ref  Range   WBC 4.2  4.0 - 10.5 K/uL   RBC 4.50  3.87 - 5.11 MIL/uL   Hemoglobin 13.6  12.0 - 15.0 g/dL   HCT 40.8  36.0 - 46.0 %   MCV 90.7  78.0 - 100.0 fL   MCH 30.2  26.0 - 34.0 pg   MCHC 33.3  30.0 - 36.0 g/dL   RDW 13.1  11.5 - 15.5 %   Platelets 236  150 - 400 K/uL  DIFFERENTIAL     Status: Abnormal   Collection Time    03/13/14  5:41 PM      Result Value Ref Range   Neutrophils Relative % 41 (*) 43 - 77 %   Neutro Abs 1.7  1.7 - 7.7 K/uL   Lymphocytes Relative 47 (*) 12 - 46 %   Lymphs Abs 2.0  0.7 - 4.0 K/uL   Monocytes Relative 9  3 - 12 %   Monocytes Absolute 0.4  0.1 - 1.0 K/uL   Eosinophils Relative 2  0 - 5 %   Eosinophils Absolute 0.1  0.0 - 0.7 K/uL   Basophils Relative 1  0 - 1 %   Basophils Absolute 0.0  0.0 - 0.1 K/uL  URINALYSIS, ROUTINE W REFLEX MICROSCOPIC     Status: Abnormal   Collection Time    03/13/14  5:50 PM      Result Value Ref Range   Color, Urine YELLOW  YELLOW   APPearance CLEAR  CLEAR   Specific Gravity, Urine 1.005  1.005 - 1.030   pH 7.0  5.0 - 8.0   Glucose, UA NEGATIVE  NEGATIVE mg/dL   Hgb urine dipstick SMALL (*) NEGATIVE   Bilirubin Urine NEGATIVE  NEGATIVE   Ketones, ur NEGATIVE  NEGATIVE mg/dL   Protein, ur NEGATIVE  NEGATIVE mg/dL   Urobilinogen, UA 0.2  0.0 - 1.0 mg/dL   Nitrite NEGATIVE  NEGATIVE   Leukocytes, UA NEGATIVE  NEGATIVE  URINE RAPID DRUG SCREEN (HOSP PERFORMED)     Status: None   Collection Time    03/13/14  5:50 PM      Result Value Ref Range   Opiates NONE DETECTED  NONE DETECTED   Cocaine NONE DETECTED  NONE DETECTED   Benzodiazepines NONE DETECTED  NONE DETECTED   Amphetamines NONE DETECTED  NONE DETECTED   Tetrahydrocannabinol NONE DETECTED  NONE DETECTED   Barbiturates NONE DETECTED  NONE DETECTED   Comment:            DRUG SCREEN FOR MEDICAL PURPOSES     ONLY.  IF CONFIRMATION IS NEEDED     FOR ANY PURPOSE, NOTIFY LAB     WITHIN 5 DAYS.                LOWEST  DETECTABLE LIMITS     FOR URINE DRUG SCREEN     Drug Class       Cutoff (ng/mL)     Amphetamine      1000     Barbiturate      200     Benzodiazepine   253     Tricyclics       664     Opiates          300     Cocaine          300     THC              50  URINE MICROSCOPIC-ADD ON  Status: None   Collection Time    03/13/14  5:50 PM      Result Value Ref Range   Squamous Epithelial / LPF RARE  RARE   WBC, UA 0-2  <3 WBC/hpf   RBC / HPF 0-2  <3 RBC/hpf   Ct Head (brain) Wo Contrast  03/13/2014   CLINICAL DATA:  Headache with left-sided body numbness today.  EXAM: CT HEAD WITHOUT CONTRAST  TECHNIQUE: Contiguous axial images were obtained from the base of the skull through the vertex without intravenous contrast.  COMPARISON:  Head CT 08/15/2006.  FINDINGS: There is no evidence of acute intracranial hemorrhage, mass lesion, brain edema or extra-axial fluid collection. The ventricles and subarachnoid spaces are appropriately sized for age. There is no CT evidence of acute cortical infarction. Mild intracranial vascular calcifications are noted.  The visualized paranasal sinuses, mastoid air cells and middle ears are clear. The calvarium is intact.  IMPRESSION: Stable head CT.  No acute intracranial findings.   Electronically Signed   By: Camie Patience M.D.   On: 03/13/2014 18:15    Assessment: 78 y.o. female comes in with acute onset confusion (resolved), left sided numbness-pain-weakness, and HA. At this moment, she still complains of left left numbness but otherwise the majority of her original symptoms lasted only for approximately 10 minutes CT brain unremarkable. Possible right brain TIA/stroke. Admit to medicine and complete IA/stroke work up. Aspirin 81 mg daily. Stroke team will follow up tomorrow.  Dorian Pod, MD [facility] [phone]  03/13/2014, 9:04 PM

## 2014-03-13 NOTE — H&P (Signed)
[facility] Admission History and Physical       [name] YHC:[id number] DOB: 07/20/1935 DOA: 03/13/2014  Referring physician:  EDP PCP: Gerrit Heck, MD  Specialists:   Chief Complaint:  Left Sided Weakness  HPI: [name] is a 78 y.o. female with a history of HTN,  Asthma who presents to the ED with complaints of sudden onset of left sided weakness that lasted for 10 minutes.   Her symptoms occurred while she was driving she had a headache and numbness and weakness of her left upper and lower extremity.  She denies having any visual changes, or nausea, vomtiing or chest pain.   She was near her PCP's office so she went to the office, where she was seen and referred to the ED.      Review of Systems:  Constitutional: No Weight Loss, No Weight Gain, Night Sweats, Fevers, Chills, Dizziness, Fatigue, or Generalized Weakness HEENT: No Headaches, Difficulty Swallowing,Tooth/Dental Problems,Sore Throat,  No Sneezing, Rhinitis, Ear Ache, Nasal Congestion, or Post Nasal Drip,  Cardio-vascular:  No Chest pain, Orthopnea, PND, Edema in Lower Extremities, Anasarca, Dizziness, Palpitations  Resp: No Dyspnea, No DOE, No Productive Cough, No Non-Productive Cough, No Hemoptysis, No Wheezing.    GI: No Heartburn, Indigestion, Abdominal Pain, Nausea, Vomiting, Diarrhea, Hematemesis, Hematochezia, Melena, Change in Bowel Habits,  Loss of Appetite  GU: No Dysuria, Change in Color of Urine, No Urgency or Frequency, No Flank pain.  Musculoskeletal: No Joint Pain or Swelling, No Decreased Range of Motion, No Back Pain.  Neurologic: No Syncope, No Seizures,  +Left Sided Muscle Weakness and Paresthesia, Vision Disturbance or Loss, No Diplopia, No Vertigo, No Difficulty Walking,  Skin: No Rash or Lesions. Psych: No Change in Mood or Affect, No Depression or Anxiety, No Memory loss, No Confusion, or Hallucinations   Past Medical History  Diagnosis Date  . Hypertension     . H/O seasonal allergies   . Asthma   . GERD (gastroesophageal reflux disease)   . Hx of colonic polyp   . Hemorrhoids, internal   . Hyperlipemia   . Asthma   . Allergy   . Arthritis   . Cataract   . Depression   . Heart murmur   . Diverticulosis 10-2011    Colonoscopy  . Hemorrhoids 10-2011    Colonoscopy  . 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    EGD      Past Surgical History  Procedure Laterality Date  . Cesarean section  1962  . Laparoscopic cholecystectomy  1995       Prior to Admission medications   Medication Sig Start Date End Date Taking? Authorizing Provider  albuterol (PROVENTIL) (2.5 MG/3ML) 0.083% nebulizer solution Take 2.5 mg by nebulization every 6 (six) hours as needed for wheezing.   Yes Historical Provider, MD  Albuterol Sulfate (PROAIR RESPICLICK) 517 (90 BASE) MCG/ACT AEPB Inhale 2 puffs into the lungs every 6 (six) hours as needed. 02/25/14  Yes Kathee Delton, MD  Alpha-D-Galactosidase (BEANO PO) Take 1 tablet by mouth as needed (gas and bloating).    Yes Historical Provider, MD  Ascorbic Acid (VITAMIN C) 1000 MG tablet Take 1,000 mg by mouth daily.   Yes Historical Provider, MD  B Complex Vitamins (B-COMPLEX/B-12 PO) Take by mouth daily.   Yes Historical Provider, MD  cycloSPORINE (RESTASIS) 0.05 % ophthalmic emulsion Place 1 drop into both eyes at bedtime as needed (for dry eyes or irritation).   Yes Historical Provider, MD  diphenhydrAMINE (BENADRYL) 25  MG tablet Take 1 tablet (25 mg total) by mouth every 6 (six) hours as needed for itching (Rash). 12/29/12  Yes Antonietta Breach, PA-C  Docusate Calcium (STOOL SOFTENER PO) Take by mouth as needed.   Yes Historical Provider, MD  esomeprazole (NEXIUM) 20 MG capsule Take 20 mg by mouth daily at 12 noon.   Yes Historical Provider, MD  losartan-hydrochlorothiazide (HYZAAR) 50-12.5 MG per tablet Take 1 tablet by mouth daily.  09/01/11  Yes Historical Provider, MD  mometasone-formoterol (DULERA) 100-5 MCG/ACT AERO Inhale  2 puffs into the lungs 2 (two) times daily. 02/04/14  Yes Kathee Delton, MD  montelukast (SINGULAIR) 10 MG tablet Take 10 mg by mouth at bedtime.   Yes Historical Provider, MD  Multiple Vitamin (MULTIVITAMIN) tablet Take 1 tablet by mouth daily.   Yes Historical Provider, MD  predniSONE (DELTASONE) 10 MG tablet Take 4 tabs daily x 2 days, 3 tabs daily x 2 days, 2 tabs daily x 2 days, 1 tab daily x 2 days 02/25/14  Yes Kathee Delton, MD  Probiotic Product (PROBIOTIC PO) Take by mouth daily. Takes 2 capsules daily   Yes Historical Provider, MD    Allergies  Allergen Reactions  . Cepacol Shortness Of Breath    Difficulty breathing  . Lactose Intolerance (Gi) Shortness Of Breath    All dairy  Causes shortness of breath  . Penicillins Shortness Of Breath and Swelling    Tongue and eyes swell     Social History:  reports that she quit smoking about 54 years ago. Her smoking use included Cigarettes. She smoked 0.50 packs per day. She has never used smokeless tobacco. She reports that she does not drink alcohol or use illicit drugs.     Family History  Problem Relation Age of Onset  . Colon cancer Sister 30  . Stomach cancer Neg Hx   . Breast cancer      Niece  . Irritable bowel syndrome Sister        Physical Exam:  GEN:  Pleasant elderly Well Nourished and Well developed  78 y.o. Caucasian female examined and in no acute distress; cooperative with exam Filed Vitals:   03/13/14 2130 03/13/14 2145 03/13/14 2200 03/13/14 2208  BP: 138/77 148/81 138/91 138/91  Pulse: 70 80  71  Temp:    98.2 F (36.8 C)  TempSrc:    Oral  Resp: 18 21 18 18   Height:      Weight:      SpO2: 100% 100%  99%   Blood pressure 138/91, pulse 71, temperature 98.2 F (36.8 C), temperature source Oral, resp. rate 18, height 4\' 9"  (1.448 m), weight 64.864 kg (143 lb), SpO2 99.00%. PSYCH: She is alert and oriented x4; does not appear anxious does not appear depressed; affect is normal HEENT: Normocephalic  and Atraumatic, Mucous membranes pink; PERRLA; EOM intact; Fundi:  Benign;  No scleral icterus, Nares: Patent, Oropharynx: Clear, Fair Dentition,    Neck:  FROM, No Cervical Lymphadenopathy nor Thyromegaly or Carotid Bruit; No JVD; Breasts:: Not examined CHEST WALL: No tenderness CHEST: Normal respiration, clear to auscultation bilaterally HEART: Regular rate and rhythm; no murmurs rubs or gallops BACK: No kyphosis or scoliosis; No CVA tenderness ABDOMEN: Positive Bowel Sounds, Soft Non-Tender; No Masses, No Organomegaly, No Pannus; No Intertriginous candida. Rectal Exam: Not done EXTREMITIES: No Cyanosis, Clubbing, or Edema; No Ulcerations. Genitalia: not examined PULSES: 2+ and symmetric SKIN: Normal hydration no rash or ulceration CNS:  Alert and Oriented x  4, No Focal Deficits Vascular: pulses palpable throughout    Labs on Admission:  Basic Metabolic Panel:  Recent Labs Lab 03/13/14 1706  NA 139  K 3.8  CL 99  CO2 26  GLUCOSE 78  BUN 21  CREATININE 0.98  CALCIUM 9.5   Liver Function Tests:  Recent Labs Lab 03/13/14 1706  AST 19  ALT 14  ALKPHOS 59  BILITOT 0.5  PROT 7.6  ALBUMIN 3.9   No results found for this basename: LIPASE, AMYLASE,  in the last 168 hours No results found for this basename: AMMONIA,  in the last 168 hours CBC:  Recent Labs Lab 03/13/14 1741  WBC 4.2  NEUTROABS 1.7  HGB 13.6  HCT 40.8  MCV 90.7  PLT 236   Cardiac Enzymes: No results found for this basename: CKTOTAL, CKMB, CKMBINDEX, TROPONINI,  in the last 168 hours  BNP (last 3 results) No results found for this basename: PROBNP,  in the last 8760 hours CBG: No results found for this basename: GLUCAP,  in the last 168 hours  Radiological Exams on Admission: Ct Head (brain) Wo Contrast  03/13/2014   CLINICAL DATA:  Headache with left-sided body numbness today.  EXAM: CT HEAD WITHOUT CONTRAST  TECHNIQUE: Contiguous axial images were obtained from the base of the skull through  the vertex without intravenous contrast.  COMPARISON:  Head CT 08/15/2006.  FINDINGS: There is no evidence of acute intracranial hemorrhage, mass lesion, brain edema or extra-axial fluid collection. The ventricles and subarachnoid spaces are appropriately sized for age. There is no CT evidence of acute cortical infarction. Mild intracranial vascular calcifications are noted.  The visualized paranasal sinuses, mastoid air cells and middle ears are clear. The calvarium is intact.  IMPRESSION: Stable head CT.  No acute intracranial findings.   Electronically Signed   By: Camie Patience M.D.   On: 03/13/2014 18:15     EKG: Independently reviewed.  Normal Sinus Rhythm at rate =70   Assessment/Plan:   78 y.o. female with   Principal Problem:   1.   TIA (transient ischemic attack)   TIA Workup initiated   MRI/MRA of Brain, Carotid US, and 2D ECHO in AM   Fasting Lipids and HbA1c in AM     Active Problems:   2.   HYPERTENSION   Monitor BPs   Continue Lisinopril /HCTZ     3.   Intrinsic asthma, unspecified   Continue Albuterol, NEBs, Dulera, Albuterol QID    4.   DVT Prophylaxis    Lovenox      Code Status:   FULL CODE    Family Communication:  Family art Bedside Disposition Plan:    Observation     Time spent:  4 Minutes  Theressa Millard [facility] Pager [phone]   If [place] Please Contact the Day Rounding Team MD for [facility]  If 7PM-7AM, Please Contact Night-Floor Coverage  [email or web address] Password [email or web address] 03/13/2014, 10:11 PM

## 2014-03-13 NOTE — ED Notes (Signed)
Pt still not in room. CT tech sts they took her from the waiting room to CT

## 2014-03-13 NOTE — Progress Notes (Signed)
Pt transferred to unit from ED via NT x 1. Pt alert and oriented upon arrival. Pt connected to telemetry and central monitoring notified. No signs or symptoms of acute distress. No complaints of pain or discomfort. Pt oriented to unit and to room. Pt now resting in bed at lowest position with call light in reach. Will continue to monitor. Fortino Sic, RN, BSN 03/13/2014 10:25 PM

## 2014-03-13 NOTE — ED Notes (Signed)
Pt reports she was driving today when she suddenly became disoriented and couldn't figure out where she was then had pain in left leg and numbness/tingling in left arm and leg, sts she also had a bad HA, reports it all lasted for about 10 mins then it started to resolve. Pt sts she is feeling a lot better now. Pt reports now the pain and numbness/tingling is gone. Reports still having a very mild HA. Nad, skin warm and dry, resp e/u.

## 2014-03-13 NOTE — ED Notes (Signed)
Attempted IV x 1 without success.  Awaiting for [place] IV RN to attempt.

## 2014-03-13 NOTE — ED Notes (Signed)
Pt returned from radiology.

## 2014-03-14 ENCOUNTER — Observation Stay (HOSPITAL_COMMUNITY): Payer: Medicare HMO

## 2014-03-14 DIAGNOSIS — M6281 Muscle weakness (generalized): Secondary | ICD-10-CM

## 2014-03-14 DIAGNOSIS — G459 Transient cerebral ischemic attack, unspecified: Secondary | ICD-10-CM

## 2014-03-14 LAB — GLUCOSE, CAPILLARY: GLUCOSE-CAPILLARY: 87 mg/dL (ref 70–99)

## 2014-03-14 LAB — LIPID PANEL
Cholesterol: 187 mg/dL (ref 0–200)
HDL: 57 mg/dL (ref >39)
LDL CALC: 118 mg/dL — AB (ref 0–99)
TRIGLYCERIDES: 59 mg/dL (ref <150)
Total CHOL/HDL Ratio: 3.3 RATIO
VLDL: 12 mg/dL (ref 0–40)

## 2014-03-14 LAB — HEMOGLOBIN A1C
HEMOGLOBIN A1C: 5.8 % — AB (ref <5.7)
MEAN PLASMA GLUCOSE: 120 mg/dL — AB (ref <117)

## 2014-03-14 MED ORDER — SIMVASTATIN 20 MG PO TABS: 20.0000 mg | ORAL_TABLET | Freq: Every day | ORAL | Status: DC

## 2014-03-14 MED ORDER — POLYETHYLENE GLYCOL 3350 17 G PO PACK
17.0000 g | PACK | Freq: Every day | ORAL | Status: DC
  Administered 2014-03-14 – 2014-03-15 (×2): 17 g via ORAL
  Filled 2014-03-14 (×2): qty 1

## 2014-03-14 NOTE — Progress Notes (Signed)
STROKE TEAM PROGRESS NOTE   HISTORY [name] is an 78 y.o. female with a past medical history significant for HTN, hyperlipidemia, GERD, depression, sent to the ED for further evaluation of confusion, left-sided numbness-pain-weakness. She said that this morning around 1030 she was driving and suddenly became " confused, did not know where I was", then the left arm and leg started getting numb and heavy, and later on developed pain in the left side and also HA. She said that the left leg still feels numb but other symptoms had resolved.  Mrs. Ellithorpe went to see her physician who asked her to come to the ED due to concern for a possible stroke.  At this moment she denies HA, vertigo, double vision, slurred speech, difficulty swallowing, language or visual disturbances. No CP, SOB, or palpitations.  CT brain tonight showed no acute intracranial abnormality.   Date last known well: 03/13/14  Time last known well: 10:30 am  tPA Given: no, late presentation, symptoms virtually resolved except mild left leg numbness   SUBJECTIVE (INTERVAL HISTORY) Back to "normal" but sill L side w brief episodes of tingling and pain.    OBJECTIVE Temp:  [97.7 F (36.5 C)-98.6 F (37 C)] 98.1 F (36.7 C) (09/26 1050) Pulse Rate:  [56-80] 77 (09/26 1050) Cardiac Rhythm:  [-]  Resp:  [13-21] 17 (09/26 1050) BP: (90-148)/(53-91) 120/73 mmHg (09/26 1050) SpO2:  [96 %-100 %] 96 % (09/26 1050) Weight:  [143 lb (64.864 kg)] 143 lb (64.864 kg) (09/25 1659)   Recent Labs Lab 03/14/14 1129  GLUCAP 87    Recent Labs Lab 03/13/14 1706  NA 139  K 3.8  CL 99  CO2 26  GLUCOSE 78  BUN 21  CREATININE 0.98  CALCIUM 9.5    Recent Labs Lab 03/13/14 1706  AST 19  ALT 14  ALKPHOS 59  BILITOT 0.5  PROT 7.6  ALBUMIN 3.9    Recent Labs Lab 03/13/14 1741  WBC 4.2  NEUTROABS 1.7  HGB 13.6  HCT 40.8  MCV 90.7  PLT 236   No results found for this basename: CKTOTAL, CKMB, CKMBINDEX,  TROPONINI,  in the last 168 hours  Recent Labs  03/13/14 1706  LABPROT 14.7  INR 1.15    Recent Labs  03/13/14 1750  COLORURINE YELLOW  LABSPEC 1.005  PHURINE 7.0  GLUCOSEU NEGATIVE  HGBUR SMALL*  BILIRUBINUR NEGATIVE  KETONESUR NEGATIVE  PROTEINUR NEGATIVE  UROBILINOGEN 0.2  NITRITE NEGATIVE  LEUKOCYTESUR NEGATIVE       Component Value Date/Time   CHOL 187 03/13/2014 2314   TRIG 59 03/13/2014 2314   HDL 57 03/13/2014 2314   CHOLHDL 3.3 03/13/2014 2314   VLDL 12 03/13/2014 2314   LDLCALC 118* 03/13/2014 2314   No results found for this basename: HGBA1C      Component Value Date/Time   LABOPIA NONE DETECTED 03/13/2014 1750   COCAINSCRNUR NONE DETECTED 03/13/2014 1750   LABBENZ NONE DETECTED 03/13/2014 1750   AMPHETMU NONE DETECTED 03/13/2014 1750   THCU NONE DETECTED 03/13/2014 1750   LABBARB NONE DETECTED 03/13/2014 1750    No results found for this basename: ETH,  in the last 168 hours  Ct Head (brain) Wo Contrast 03/13/2014    Stable head CT.  No acute intracranial findings.   MRI / MRA Brain Without Contrast 03/14/2014    1. No acute intracranial abnormality.  2. Mild chronic small vessel ischemic disease.  3. Unremarkable head MRA.  Mr Cervical Spine Wo Contrast 03/14/2014    1. 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  2. The CSF surrounding the cord is effaced at C5-6. There is no cord flattening or abnormal signal.  3. Osteophytes contribute to at least moderate foraminal narrowing at multiple levels as detailed above.  4. No definite acute findings.       PHYSICAL EXAM   GENERAL: No distress.   HEENT: normal  ABDOMEN: soft  EXTREMITIES: No edema   SKIN: Normal by inspection.    MENTAL STATUS: Alert and oriented. Speech, language and cognition are generally intact. Judgment and insight normal.   CRANIAL NERVES: Pupils are equal, round and  reactive to light and accomodation; extra ocular movements are full, there is no significant nystagmus; visual fields are full; upper and lower facial muscles are normal in strength and symmetric, there is no flattening of the nasolabial folds; tongue is midline; uvula is midline; shoulder elevation is normal.  MOTOR: Normal tone, bulk and strength; no pronator drift.  COORDINATION: Left finger to nose is normal, right finger to nose is normal, No rest tremor; no intention tremor; no postural tremor; no bradykinesia.  SENSATION: Normal to light touch.       ASSESSMENT/PLAN  Ms. GRAYCIE HALLEY is a 78 y.o. female with history of hypertension and hyperlipidemia, presenting with confusion, left-sided numbness, pain, weakness. She did not receive TPA secondary to late presentation and improvement in deficits. An MRI showed no acute intracranial abnormality.  TIA:  right  MRI  as above  MRA  as above  Carotid Doppler - Preliminary report: 1-39% ICA stenosis. Vertebral artery flow is antegrade.  2D Echo - Pending                                                                        no antithrombotics prior to admission, now on aspirin 325 mg orally every day  LDL 118, no statin necessary as meeting goal of LDL <100  HgbA1c Pending  Lovenox for VTE prophylaxis  Cardiac with thin liquids.   Bathroom privileges with assistance  Resultant - episodic tingling --- Get EEG  Therapy recommendations:  Pending  Ongoing aggressive risk factor management  Risk factor education  Patient counseled to be compliant with her antithrombotic medications  Disposition: Pending  Hypertension   Permissive hypertension <220/120 for 24-48 hours and then gradually normalize within 5-7 days  BP goal long term normotensive   BP (90-148)/(53-91) 120/73 mmHg (09/26 1050)  past 24h (03/14/2014 @ 1:29 PM)  Stable - except mildly low at times  Patient counseled to be compliant with her  blood pressure medications  Hyperlipidemia  Home meds: No lipid lowering agents prior to admission. Now on Zocor.  LDL 118, goal < 100 (<70 for diabetics)  Continue statin at discharge   Other Stroke Risk Factors Advanced age   Obesity, Body mass index is 30.94 kg/(m^2).    Other Active Problems    Other Pertinent History    Hospital day # 1  Mikey Bussing [facility] Pager [phone] 03/14/2014, 1:53 PM     To contact Stroke Continuity provider, please refer to [email or web address] After  hours, contact General Neurology

## 2014-03-14 NOTE — Progress Notes (Signed)
[facility] PROGRESS NOTE  [name] HUT:[id number] DOB: 1935/09/01 DOA: 03/13/2014  PCP: Gerrit Heck, MD  Brief HPI: [name] is a 78 y.o. female with a history of HTN, Asthma who presented to the ED with complaints of sudden onset of left sided weakness that lasted for 10 minutes. Her symptoms occurred while she was driving and she had a headache and numbness and weakness of her left upper and lower extremity.   Past medical history:  Past Medical History  Diagnosis Date  . Hypertension   . H/O seasonal allergies   . Asthma   . GERD (gastroesophageal reflux disease)   . Hx of colonic polyp   . Hemorrhoids, internal   . Hyperlipemia   . Asthma   . Allergy   . Arthritis   . Cataract   . Depression   . Heart murmur   . Diverticulosis 10-2011    Colonoscopy  . Hemorrhoids 10-2011    Colonoscopy  . 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    EGD    Consultants: Neurology  Procedures:  2D ECHO and Carotid Dopplers are pending  Antibiotics: None  Subjective: Patient feels better this morning. The tingling and numbness in the left upper extremity and left leg are improved. She also states that the numbness in the left lower extremity and weakness has been present for a very long time. She has a history of chronic lower back pain. She has had MRI of her back in the past. She also complains of pain in her neck area with occasional tingling, numbness in the left arm previously as well. Denies any recent falls or injuries.  Objective: Vital Signs  Filed Vitals:   03/14/14 0430 03/14/14 0630 03/14/14 0800 03/14/14 1050  BP: 128/71 129/72 129/72 120/73  Pulse: 63 61 59 77  Temp: 98.2 F (36.8 C) 98.4 F (36.9 C)  98.1 F (36.7 C)  TempSrc: Oral Oral  Oral  Resp: 16 18 16 17   Height:      Weight:      SpO2: 98% 100% 100% 96%    Intake/Output Summary (Last 24 hours) at 03/14/14 1223 Last data filed at 03/14/14 1122  Gross per 24 hour    Intake    240 ml  Output      0 ml  Net    240 ml   Filed Weights   03/13/14 1659  Weight: 64.864 kg (143 lb)    General appearance: alert, cooperative, appears stated age and no distress Head: Normocephalic, without obvious abnormality, atraumatic Neck: no adenopathy, no carotid bruit, no JVD, supple, symmetrical, trachea midline and thyroid not enlarged, symmetric, no tenderness/mass/nodules Resp: clear to auscultation bilaterally Cardio: regular rate and rhythm, S1, S2 normal, no murmur, click, rub or gallop GI: soft, non-tender; bowel sounds normal; no masses,  no organomegaly Extremities: extremities normal, atraumatic, no cyanosis or edema Neurologic: No facial droop. Tongue is midline. Strength was 5/5 bilateral UE. Weakness noted in left lower extremity. No hyperreflexia. Straight leg raising test was unremarkable.  Lab Results:  Basic Metabolic Panel:  Recent Labs Lab 03/13/14 1706  NA 139  K 3.8  CL 99  CO2 26  GLUCOSE 78  BUN 21  CREATININE 0.98  CALCIUM 9.5   Liver Function Tests:  Recent Labs Lab 03/13/14 1706  AST 19  ALT 14  ALKPHOS 59  BILITOT 0.5  PROT 7.6  ALBUMIN 3.9   CBC:  Recent Labs Lab 03/13/14 1741  WBC 4.2  NEUTROABS  1.7  HGB 13.6  HCT 40.8  MCV 90.7  PLT 236   CBG:  Recent Labs Lab 03/14/14 1129  GLUCAP 87     Studies/Results: Ct Head (brain) Wo Contrast  03/13/2014   CLINICAL DATA:  Headache with left-sided body numbness today.  EXAM: CT HEAD WITHOUT CONTRAST  TECHNIQUE: Contiguous axial images were obtained from the base of the skull through the vertex without intravenous contrast.  COMPARISON:  Head CT 08/15/2006.  FINDINGS: There is no evidence of acute intracranial hemorrhage, mass lesion, brain edema or extra-axial fluid collection. The ventricles and subarachnoid spaces are appropriately sized for age. There is no CT evidence of acute cortical infarction. Mild intracranial vascular calcifications are noted.  The  visualized paranasal sinuses, mastoid air cells and middle ears are clear. The calvarium is intact.  IMPRESSION: Stable head CT.  No acute intracranial findings.   Electronically Signed   By: Camie Patience M.D.   On: 03/13/2014 18:15   Mri Brain Without Contrast  03/14/2014   CLINICAL DATA:  TIA. Sudden onset confusion/ disorientation with left arm and leg numbness. Headaches.  EXAM: MRI HEAD WITHOUT CONTRAST  MRA HEAD WITHOUT CONTRAST  TECHNIQUE: Multiplanar, multiecho pulse sequences of the brain and surrounding structures were obtained without intravenous contrast. Angiographic images of the head were obtained using MRA technique without contrast.  COMPARISON:  Head CT 03/13/2014  FINDINGS: MRI HEAD FINDINGS  There is no evidence of acute infarct, intracranial hemorrhage, mass, midline shift, or extra-axial fluid collection. Foci of T2 hyperintensity in the subcortical and deep cerebral white matter and pons are nonspecific but compatible with mild chronic small vessel ischemic disease. There is mild age-related cerebral atrophy.  Orbits are unremarkable. Paranasal sinuses and mastoid air cells are clear. Major intracranial vascular flow voids are preserved.  MRA HEAD FINDINGS  The visualized distal vertebral arteries are patent with the left being slightly larger than the right. PICA origins are patent. The basilar artery is tortuous without stenosis. AICA and SCA origins are patent. There is a patent left posterior communicating artery. The PCAs are unremarkable.  Internal carotid arteries are patent from skullbase to carotid termini without stenosis. ACAs and MCAs are unremarkable. Anterior communicating artery is patent. No intracranial aneurysm is identified.  IMPRESSION: 1. No acute intracranial abnormality. 2. Mild chronic small vessel ischemic disease. 3. Unremarkable head MRA.   Electronically Signed   By: Logan Bores   On: 03/14/2014 11:12   Mr Cervical Spine Wo Contrast  03/14/2014   CLINICAL  DATA:  Left neck pain with left arm and leg paresthesias and headaches.  EXAM: MRI CERVICAL SPINE WITHOUT CONTRAST  TECHNIQUE: Multiplanar, multisequence MR imaging of the cervical spine was performed. No intravenous contrast was administered.  COMPARISON:  None.  FINDINGS: The cervical alignment is normal. There is no evidence of acute cervical spine fracture or paraspinous ligamentous injury. There are diffuse degenerative changes throughout the cervical spine with mild endplate changes from N8-2 through C6-7. 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  The craniocervical junction appears normal. The cervical cord is normal in signal and caliber. There are bilateral vertebral artery flow voids.  C2-3: Mild uncinate spurring and facet hypertrophy. The foramina appear sufficiently patent. No cord deformity.  C3-4: Spondylosis with loss of disc height, posterior osteophytes and bilateral uncinate spurring. There is moderate foraminal narrowing bilaterally. No cord deformity.  C4-5: Spondylosis with loss of disc height, posterior osteophytes and bilateral uncinate spurring. There is moderate foraminal  narrowing bilaterally. No cord deformity.  C5-6: Spondylosis with loss of disc height, posterior osteophytes and bilateral uncinate spurring. The AP diameter of the canal is narrowed to 7 mm without cord deformity. There is at least moderate biforaminal stenosis.  C6-7: Spondylosis with loss of disc height, posterior osteophytes and bilateral uncinate spurring. Moderate right-greater-than-left foraminal narrowing is present. No cord deformity.  C7-T1: Asymmetric facet hypertrophy on the left contributes to a slight anterolisthesis. No cord deformity or significant foraminal compromise.  IMPRESSION: 1. 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 2. The CSF surrounding the cord is effaced at C5-6. There is no  cord flattening or abnormal signal. 3. Osteophytes contribute to at least moderate foraminal narrowing at multiple levels as detailed above. 4. No definite acute findings.   Electronically Signed   By: Camie Patience M.D.   On: 03/14/2014 10:50   Mr Jodene Nam Head/brain Wo Cm  03/14/2014   CLINICAL DATA:  TIA. Sudden onset confusion/ disorientation with left arm and leg numbness. Headaches.  EXAM: MRI HEAD WITHOUT CONTRAST  MRA HEAD WITHOUT CONTRAST  TECHNIQUE: Multiplanar, multiecho pulse sequences of the brain and surrounding structures were obtained without intravenous contrast. Angiographic images of the head were obtained using MRA technique without contrast.  COMPARISON:  Head CT 03/13/2014  FINDINGS: MRI HEAD FINDINGS  There is no evidence of acute infarct, intracranial hemorrhage, mass, midline shift, or extra-axial fluid collection. Foci of T2 hyperintensity in the subcortical and deep cerebral white matter and pons are nonspecific but compatible with mild chronic small vessel ischemic disease. There is mild age-related cerebral atrophy.  Orbits are unremarkable. Paranasal sinuses and mastoid air cells are clear. Major intracranial vascular flow voids are preserved.  MRA HEAD FINDINGS  The visualized distal vertebral arteries are patent with the left being slightly larger than the right. PICA origins are patent. The basilar artery is tortuous without stenosis. AICA and SCA origins are patent. There is a patent left posterior communicating artery. The PCAs are unremarkable.  Internal carotid arteries are patent from skullbase to carotid termini without stenosis. ACAs and MCAs are unremarkable. Anterior communicating artery is patent. No intracranial aneurysm is identified.  IMPRESSION: 1. No acute intracranial abnormality. 2. Mild chronic small vessel ischemic disease. 3. Unremarkable head MRA.   Electronically Signed   By: Logan Bores   On: 03/14/2014 11:12    Medications:  Scheduled: . aspirin  325 mg  Oral Daily  . docusate sodium  100 mg Oral BID  . enoxaparin (LOVENOX) injection  40 mg Subcutaneous QHS  . losartan  50 mg Oral Daily   And  . hydrochlorothiazide  12.5 mg Oral Daily  . mometasone-formoterol  2 puff Inhalation BID  . montelukast  10 mg Oral QHS  . pantoprazole  40 mg Oral Daily  . saccharomyces boulardii  500 mg Oral q morning - 10a  . simvastatin  20 mg Oral q1800   Continuous:  OFB:PZWCHENIDPOEU, albuterol, albuterol, cycloSPORINE, diphenhydrAMINE  Assessment/Plan:  Principal Problem:   TIA (transient ischemic attack) Active Problems:   HYPERTENSION   Intrinsic asthma, unspecified    Left-sided weakness It is unclear if the symptoms are due to a central process or not. MRI brain does not show a stroke. MRI of the cervical spine does show spondylosis and osteophytes, but no cord involvement is noted. TIA is a possibility. Will await further neurological input. Await physical and occupational therapy evaluation. She may benefit from  MRI of the lumbar spine as the last one was done about 10 years ago, but this can be pursued as an outpatient especially if she's not functionally limited. Continue with aspirin for now. LDL was in 118. She was started on a statin.  History of essential hypertension  Continue Lisinopril /HCTZ   History of Intrinsic asthma, unspecified  Stable. Continue Albuterol, NEBs, Dulera, Albuterol QID    DVT Prophylaxis: Enoxaparin    Code Status: Full code  Family Communication: Discussed in detail with the patient  Disposition Plan: Await PT and OT evaluations.    LOS: 1 day   [facility] Pager [phone] 03/14/2014, 12:23 PM  If 8PM-8AM, please contact night-coverage at [email or web address], password [facility]

## 2014-03-14 NOTE — Progress Notes (Signed)
VASCULAR LAB PRELIMINARY  PRELIMINARY  PRELIMINARY  PRELIMINARY  Carotid Dopplers completed.    Preliminary report:  1-39% ICA stenosis.  Vertebral artery flow is antegrade.   Mateen Franssen, RVT 03/14/2014, 12:30 PM

## 2014-03-14 NOTE — Progress Notes (Signed)
UR completed 

## 2014-03-14 NOTE — Evaluation (Signed)
Occupational Therapy Evaluation Patient Details Name: [name] MRN: [id number] DOB: 03/12/1936 Today's Date: 03/14/2014    History of Present Illness 78 y.o. admitted with confusion, left sided numbness-pain-weakness. MRI/CT negative.   Clinical Impression   Pt admitted with above. Education provided. No further OT needs.    Follow Up Recommendations  No OT follow up    Equipment Recommendations  None recommended by OT    Recommendations for Other Services       Precautions / Restrictions Restrictions Weight Bearing Restrictions: No      Mobility Bed Mobility Overal bed mobility: Modified Independent                Transfers Overall transfer level: Modified independent                    Balance                                            ADL Overall ADL's : Needs assistance/impaired                     Lower Body Dressing: Modified independent;Sit to/from stand   Toilet Transfer: Supervision/safety;Modified Independent       Tub/ Banker: Ambulation;Supervision/safety   Functional mobility during ADLs: Supervision/safety;Modified independent General ADL Comments: Educated on BE FAST stroke education and importance of getting help right away. Recommended avoiding canned foods due to increased sodium. Recommended someone be with pt for tub transfer at least first trial just to be sure she is good at home. Practiced getting down in simulated tub. explained what a TIA was to pt.     Vision                     Perception     Praxis      Pertinent Vitals/Pain Pain Assessment:  (no pain reported)     Hand Dominance Right   Extremity/Trunk Assessment Upper Extremity Assessment Upper Extremity Assessment: LUE deficits/detail LUE Sensation: decreased light touch   Lower Extremity Assessment Lower Extremity Assessment: LLE deficits/detail LLE Sensation: decreased light touch        Communication Communication Communication: No difficulties   Cognition Arousal/Alertness: Awake/alert Behavior During Therapy: WFL for tasks assessed/performed Overall Cognitive Status: Within Functional Limits for tasks assessed                     General Comments       Exercises       Shoulder Instructions      Home Living Family/patient expects to be discharged to:: Private residence Living Arrangements: Children (takes care of her handicapped son)   Type of Home: Other(Comment) (townhome) Home Access: Level entry     Home Layout: Two level;Able to live on main level with bedroom/bathroom Alternate Level Stairs-Number of Steps: 4 then 8-10 Alternate Level Stairs-Rails: Right Bathroom Shower/Tub: Teacher, early years/pre: Standard                Prior Functioning/Environment Level of Independence: Independent             OT Diagnosis:     OT Problem List:     OT Treatment/Interventions:      OT Goals(Current goals can be found in the care plan section)    OT Frequency:  Barriers to D/C:            Co-evaluation              End of Session Equipment Utilized During Treatment: Gait belt  Activity Tolerance: Patient tolerated treatment well Patient left: in bed;with call bell/phone within reach;with family/visitor present   Time: 1450-1506 OT Time Calculation (min): 16 min Charges:  OT General Charges $OT Visit: 1 Procedure OT Evaluation $Initial OT Evaluation Tier I: 1 Procedure G-Codes: OT G-codes **NOT FOR INPATIENT CLASS** Functional Assessment Tool Used: clinical judgment Functional Limitation: Self care Self Care Current Status (H7342): At least 1 percent but less than 20 percent impaired, limited or restricted Self Care Goal Status (A7681): At least 1 percent but less than 20 percent impaired, limited or restricted Self Care Discharge Status [phone]): At least 1 percent but less than 20 percent impaired,  limited or restricted  Benito Mccreedy OTR/L [phone] 03/14/2014, 3:20 PM

## 2014-03-15 DIAGNOSIS — I369 Nonrheumatic tricuspid valve disorder, unspecified: Secondary | ICD-10-CM

## 2014-03-15 LAB — BASIC METABOLIC PANEL
Anion gap: 12 (ref 5–15)
BUN: 16 mg/dL (ref 6–23)
CO2: 26 mEq/L (ref 19–32)
Calcium: 9.2 mg/dL (ref 8.4–10.5)
Chloride: 104 mEq/L (ref 96–112)
Creatinine, Ser: 0.94 mg/dL (ref 0.50–1.10)
GFR calc Af Amer: 67 mL/min — ABNORMAL LOW (ref >90)
GFR, EST NON AFRICAN AMERICAN: 57 mL/min — AB (ref >90)
Glucose, Bld: 89 mg/dL (ref 70–99)
POTASSIUM: 3.6 meq/L — AB (ref 3.7–5.3)
Sodium: 142 mEq/L (ref 137–147)

## 2014-03-15 LAB — CBC
HEMATOCRIT: 36 % (ref 36.0–46.0)
Hemoglobin: 12.3 g/dL (ref 12.0–15.0)
MCH: 29.9 pg (ref 26.0–34.0)
MCHC: 34.2 g/dL (ref 30.0–36.0)
MCV: 87.4 fL (ref 78.0–100.0)
PLATELETS: 263 10*3/uL (ref 150–400)
RBC: 4.12 MIL/uL (ref 3.87–5.11)
RDW: 13 % (ref 11.5–15.5)
WBC: 3.5 10*3/uL — ABNORMAL LOW (ref 4.0–10.5)

## 2014-03-15 MED ORDER — ASPIRIN EC 325 MG PO TBEC: 325.0000 mg | DELAYED_RELEASE_TABLET | Freq: Every day | ORAL | Status: DC

## 2014-03-15 MED ORDER — SIMVASTATIN 20 MG PO TABS: 20.0000 mg | ORAL_TABLET | Freq: Every day | ORAL | Status: DC

## 2014-03-15 MED ORDER — POTASSIUM CHLORIDE CRYS ER 20 MEQ PO TBCR
40.0000 meq | EXTENDED_RELEASE_TABLET | Freq: Once | ORAL | Status: AC
  Administered 2014-03-15: 40 meq via ORAL
  Filled 2014-03-15: qty 2

## 2014-03-15 MED ORDER — POLYETHYLENE GLYCOL 3350 17 G PO PACK: 17.0000 g | PACK | Freq: Two times a day (BID) | ORAL | Status: DC

## 2014-03-15 NOTE — Progress Notes (Signed)
  Echocardiogram 2D Echocardiogram has been performed.  Darlina Sicilian M 03/15/2014, 12:59 PM

## 2014-03-15 NOTE — Discharge Instructions (Signed)
Transient Ischemic Attack  A transient ischemic attack (TIA) is a "warning stroke" that causes stroke-like symptoms. Unlike a stroke, a TIA does not cause permanent damage to the brain. The symptoms of a TIA can happen very fast and do not last long. It is important to know the symptoms of a TIA and what to do. This can help prevent a major stroke or death.  CAUSES   · A TIA is caused by a temporary blockage in an artery in the brain or neck (carotid artery). The blockage does not allow the brain to get the blood supply it needs and can cause different symptoms. The blockage can be caused by either:  ¨ A blood clot.  ¨ Fatty buildup (plaque) in a neck or brain artery.  RISK FACTORS  · High blood pressure (hypertension).  · High cholesterol.  · Diabetes mellitus.  · Heart disease.  · The build up of plaque in the blood vessels (peripheral artery disease or atherosclerosis).  · The build up of plaque in the blood vessels providing blood and oxygen to the brain (carotid artery stenosis).  · An abnormal heart rhythm (atrial fibrillation).  · Obesity.  · Smoking.  · Taking oral contraceptives (especially in combination with smoking).  · Physical inactivity.  · A diet high in fats, salt (sodium), and calories.  · Alcohol use.  · Use of illegal drugs (especially cocaine and methamphetamine).  · Being female.  · Being African American.  · Being over the age of 55.  · Family history of stroke.  · Previous history of blood clots, stroke, TIA, or heart attack.  · Sickle cell disease.  SYMPTOMS   TIA symptoms are the same as a stroke but are temporary. These symptoms usually develop suddenly, or may be newly present upon awakening from sleep:  · Sudden weakness or numbness of the face, arm, or leg, especially on one side of the body.  · Sudden trouble walking or difficulty moving arms or legs.  · Sudden confusion.  · Sudden personality changes.  · Trouble speaking (aphasia) or understanding.  · Difficulty swallowing.  · Sudden  trouble seeing in one or both eyes.  · Double vision.  · Dizziness.  · Loss of balance or coordination.  · Sudden severe headache with no known cause.  · Trouble reading or writing.  · Loss of bowel or bladder control.  · Loss of consciousness.  DIAGNOSIS   Your caregiver may be able to determine the presence or absence of a TIA based on your symptoms, history, and physical exam. Computed tomography (CT scan) of the brain is usually performed to help identify a TIA. Other tests may be done to diagnose a TIA. These tests may include:  · Electrocardiography.  · Continuous heart monitoring.  · Echocardiography.  · Carotid ultrasonography.  · Magnetic resonance imaging (MRI).  · A scan of the brain circulation.  · Blood tests.  PREVENTION   The risk of a TIA can be decreased by appropriately treating high blood pressure, high cholesterol, diabetes, heart disease, and obesity and by quitting smoking, limiting alcohol, and staying physically active.  TREATMENT   Time is of the essence. Since the symptoms of TIA are the same as a stroke, it is important to seek treatment as soon as possible because you may need a medicine to dissolve the clot (thrombolytic) that cannot be given if too much time has passed. Treatment options vary. Treatment options may include rest, oxygen, intravenous (IV) fluids,   and medicines to thin the blood (anticoagulants). Medicines and diet may be used to address diabetes, high blood pressure, and other risk factors. Measures will be taken to prevent short-term and long-term complications, including infection from breathing foreign material into the lungs (aspiration pneumonia), blood clots in the legs, and falls. Treatment options include procedures to either remove plaque in the carotid arteries or dilate carotid arteries that have narrowed due to plaque. Those procedures are:  · Carotid endarterectomy.  · Carotid angioplasty and stenting.  HOME CARE INSTRUCTIONS   · Take all medicines prescribed  by your caregiver. Follow the directions carefully. Medicines may be used to control risk factors for a stroke. Be sure you understand all your medicine instructions.  · You may be told to take aspirin or the anticoagulant warfarin. Warfarin needs to be taken exactly as instructed.  ¨ Taking too much or too little warfarin is dangerous. Too much warfarin increases the risk of bleeding. Too little warfarin continues to allow the risk for blood clots. While taking warfarin, you will need to have regular blood tests to measure your blood clotting time. A PT blood test measures how long it takes for blood to clot. Your PT is used to calculate another value called an INR. Your PT and INR help your caregiver to adjust your dose of warfarin. The dose can change for many reasons. It is critically important that you take warfarin exactly as prescribed.  ¨ Many foods, especially foods high in vitamin K can interfere with warfarin and affect the PT and INR. Foods high in vitamin K include spinach, kale, broccoli, cabbage, collard and turnip greens, brussels sprouts, peas, cauliflower, seaweed, and parsley as well as beef and pork liver, green tea, and soybean oil. You should eat a consistent amount of foods high in vitamin K. Avoid major changes in your diet, or notify your caregiver before changing your diet. Arrange a visit with a dietitian to answer your questions.  ¨ Many medicines can interfere with warfarin and affect the PT and INR. You must tell your caregiver about any and all medicines you take, this includes all vitamins and supplements. Be especially cautious with aspirin and anti-inflammatory medicines. Do not take or discontinue any prescribed or over-the-counter medicine except on the advice of your caregiver or pharmacist.  ¨ Warfarin can have side effects, such as excessive bruising or bleeding. You will need to hold pressure over cuts for longer than usual. Your caregiver or pharmacist will discuss other  potential side effects.  ¨ Avoid sports or activities that may cause injury or bleeding.  ¨ Be mindful when shaving, flossing your teeth, or handling sharp objects.  ¨ Alcohol can change the body's ability to handle warfarin. It is best to avoid alcoholic drinks or consume only very small amounts while taking warfarin. Notify your caregiver if you change your alcohol intake.  ¨ Notify your dentist or other caregivers before procedures.  · Eat a diet that includes 5 or more servings of fruits and vegetables each day. This may reduce the risk of stroke. Certain diets may be prescribed to address high blood pressure, high cholesterol, diabetes, or obesity.  ¨ A low-sodium, low-saturated fat, low-trans fat, low-cholesterol diet is recommended to manage high blood pressure.  ¨ A low-saturated fat, low-trans fat, low-cholesterol, and high-fiber diet may control cholesterol levels.  ¨ A controlled-carbohydrate, controlled-sugar diet is recommended to manage diabetes.  ¨ A reduced-calorie, low-sodium, low-saturated fat, low-trans fat, low-cholesterol diet is recommended to manage obesity.  ·   Maintain a healthy weight.  · Stay physically active. It is recommended that you get at least 30 minutes of activity on most or all days.  · Do not smoke.  · Limit alcohol use even if you are not taking warfarin. Moderate alcohol use is considered to be:  ¨ No more than 2 drinks each day for men.  ¨ No more than 1 drink each day for nonpregnant women.  · Stop drug abuse.  · Home safety. A safe home environment is important to reduce the risk of falls. Your caregiver may arrange for specialists to evaluate your home. Having grab bars in the bedroom and bathroom is often important. Your caregiver may arrange for equipment to be used at home, such as raised toilets and a seat for the shower.  · Follow all instructions for follow-up with your caregiver. This is very important. This includes any referrals and lab tests. Proper follow up can  prevent a stroke or another TIA from occurring.  SEEK MEDICAL CARE IF:  · You have personality changes.  · You have difficulty swallowing.  · You are seeing double.  · You have dizziness.  · You have a fever.  · You have skin breakdown.  SEEK IMMEDIATE MEDICAL CARE IF:   Any of these symptoms may represent a serious problem that is an emergency. Do not wait to see if the symptoms will go away. Get medical help right away. Call your local emergency services (911 in [place] Do not drive yourself to the hospital.  · You have sudden weakness or numbness of the face, arm, or leg, especially on one side of the body.  · You have sudden trouble walking or difficulty moving arms or legs.  · You have sudden confusion.  · You have trouble speaking (aphasia) or understanding.  · You have sudden trouble seeing in one or both eyes.  · You have a loss of balance or coordination.  · You have a sudden, severe headache with no known cause.  · You have new chest pain or an irregular heartbeat.  · You have a partial or total loss of consciousness.  MAKE SURE YOU:   · Understand these instructions.  · Will watch your condition.  · Will get help right away if you are not doing well or get worse.  Document Released: 03/15/2005 Document Revised: 06/10/2013 Document Reviewed: 09/10/2013  [employer]® Patient Information ©2015 [employer]. This information is not intended to replace advice given to you by your health care provider. Make sure you discuss any questions you have with your health care provider.

## 2014-03-15 NOTE — Progress Notes (Signed)
PT Cancellation Note  Patient Details Name: [name] MRN: [id number] DOB: 06/27/1935   Cancelled Treatment:    Reason Eval/Treat Not Completed: PT screened, no needs identified, will sign off. Spoke with OT. No PT needs required. Will sign off.    Elie Confer [place], [place] 03/15/2014, 9:33 AM

## 2014-03-15 NOTE — Progress Notes (Signed)
STROKE TEAM PROGRESS NOTE   HISTORY [name] is an 78 y.o. female with a past medical history significant for HTN, hyperlipidemia, GERD, depression, sent to the ED for further evaluation of confusion, left-sided numbness-pain-weakness. She said that this morning around 1030 she was driving and suddenly became " confused, did not know where I was", then the left arm and leg started getting numb and heavy, and later on developed pain in the left side and also HA. She said that the left leg still feels numb but other symptoms had resolved.  [name] went to see her physician who asked her to come to the ED due to concern for a possible stroke.  At this moment she denies HA, vertigo, double vision, slurred speech, difficulty swallowing, language or visual disturbances. No CP, SOB, or palpitations.  CT brain tonight showed no acute intracranial abnormality.   Date last known well: 03/13/14  Time last known well: 10:30 am  tPA Given: no, late presentation, symptoms virtually resolved except mild left leg numbness   SUBJECTIVE (INTERVAL HISTORY) Back to "normal" but sill L side w brief episodes of tingling and pain. Apparently has had L sided numbness episodically for several weeks. But getting worse. Also C/O LBP and numbness of feet w walking. C/O abd irriatation with multiple food times-- defer to PCP -- consider GI consult outpt.    OBJECTIVE Temp:  [97.7 F (36.5 C)-98.6 F (37 C)] 98.1 F (36.7 C) (09/26 1050) Pulse Rate:  [56-80] 77 (09/26 1050) Cardiac Rhythm:  [-]  Resp:  [13-21] 17 (09/26 1050) BP: (90-148)/(53-91) 120/73 mmHg (09/26 1050) SpO2:  [96 %-100 %] 96 % (09/26 1050) Weight:  [143 lb (64.864 kg)] 143 lb (64.864 kg) (09/25 1659)   Recent Labs Lab 03/14/14 1129  GLUCAP 87    Recent Labs Lab 03/13/14 1706 03/15/14 0628  NA 139 142  K 3.8 3.6*  CL 99 104  CO2 26 26  GLUCOSE 78 89  BUN 21 16  CREATININE 0.98 0.94  CALCIUM 9.5 9.2    Recent  Labs Lab 03/13/14 1706  AST 19  ALT 14  ALKPHOS 59  BILITOT 0.5  PROT 7.6  ALBUMIN 3.9    Recent Labs Lab 03/13/14 1741 03/15/14 0628  WBC 4.2 3.5*  NEUTROABS 1.7  --   HGB 13.6 12.3  HCT 40.8 36.0  MCV 90.7 87.4  PLT 236 263   No results found for this basename: CKTOTAL, CKMB, CKMBINDEX, TROPONINI,  in the last 168 hours  Recent Labs  03/13/14 1706  LABPROT 14.7  INR 1.15    Recent Labs  03/13/14 1750  COLORURINE YELLOW  LABSPEC 1.005  PHURINE 7.0  GLUCOSEU NEGATIVE  HGBUR SMALL*  BILIRUBINUR NEGATIVE  KETONESUR NEGATIVE  PROTEINUR NEGATIVE  UROBILINOGEN 0.2  NITRITE NEGATIVE  LEUKOCYTESUR NEGATIVE       Component Value Date/Time   CHOL 187 03/13/2014 2314   TRIG 59 03/13/2014 2314   HDL 57 03/13/2014 2314   CHOLHDL 3.3 03/13/2014 2314   VLDL 12 03/13/2014 2314   LDLCALC 118* 03/13/2014 2314   Lab Results  Component Value Date   HGBA1C 5.8* 03/13/2014      Component Value Date/Time   LABOPIA NONE DETECTED 03/13/2014 1750   COCAINSCRNUR NONE DETECTED 03/13/2014 1750   LABBENZ NONE DETECTED 03/13/2014 1750   AMPHETMU NONE DETECTED 03/13/2014 1750   THCU NONE DETECTED 03/13/2014 1750   LABBARB NONE DETECTED 03/13/2014 1750    No results found for this  basename: ETH,  in the last 168 hours  Ct Head (brain) Wo Contrast 03/13/2014    Stable head CT.  No acute intracranial findings.   MRI / MRA Brain Without Contrast 03/14/2014    1. No acute intracranial abnormality.  2. Mild chronic small vessel ischemic disease.  3. Unremarkable head MRA.      Mr Cervical Spine Wo Contrast 03/14/2014    1. 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  2. The CSF surrounding the cord is effaced at C5-6. There is no cord flattening or abnormal signal.  3. Osteophytes contribute to at least moderate foraminal narrowing at multiple levels as detailed above.  4. No definite  acute findings.     EEG - Pending  PHYSICAL EXAM   GENERAL: No distress.   HEENT: normal  ABDOMEN: soft  EXTREMITIES: No edema   SKIN: Normal by inspection.    MENTAL STATUS: Alert and oriented. Speech, language and cognition are generally intact. Judgment and insight normal.   CRANIAL NERVES: Pupils are equal, round and reactive to light and accomodation; extra ocular movements are full, there is no significant nystagmus; visual fields are full; upper and lower facial muscles are normal in strength and symmetric, there is no flattening of the nasolabial folds; tongue is midline; uvula is midline; shoulder elevation is normal.  MOTOR: Normal tone, bulk and strength; no pronator drift.  COORDINATION: Left finger to nose is normal, right finger to nose is normal, No rest tremor; no intention tremor; no postural tremor; no bradykinesia.  SENSATION: Normal to light touch.       ASSESSMENT/PLAN  [name] is a 78 y.o. female with history of hypertension and hyperlipidemia, presenting with confusion, left-sided numbness, pain, weakness. She did not receive TPA secondary to late presentation and improvement in deficits. An MRI showed no acute intracranial abnormality.  TIA:  right  MRI - No acute intracranial abnormality.   MRA  as above  Carotid Doppler - Preliminary report: 1-39% ICA stenosis. Vertebral artery flow is antegrade.  2D Echo - Pending                                                                        no antithrombotics prior to admission, now on aspirin 325 mg orally every day  LDL 118, no statin necessary as meeting goal of LDL <100  HgbA1c 5.8 WNL  Lovenox for VTE prophylaxis  Cardiac with thin liquids.   Bathroom privileges with assistance  Resultant - episodic tingling --- Get EEG - Pending -Get as outpt next week-- wants to go home  Therapy recommendations:  No followup physical or occupational therapies  Ongoing aggressive  risk factor management  Risk factor education  Patient counseled to be compliant with her antithrombotic medications  Disposition: Home Follow up Dr. Erlinda Hong - stroke clinic in 1 month.    Hypertension   Permissive hypertension <220/120 for 24-48 hours and then gradually normalize within 5-7 days  BP goal long term normotensive   BP (90-148)/(53-91) 120/73 mmHg (09/26 1050)  past 24h (03/15/2014 @ 8:11 AM)  Stable - except mildly low at times  Patient counseled to be compliant with her  blood pressure medications  Hyperlipidemia  Home meds: No lipid lowering agents prior to admission. Now on Zocor.  LDL 118, goal < 100 (<70 for diabetics)  Continue statin at discharge   Other Stroke Risk Factors Advanced age   Obesity, Body mass index is 30.94 kg/(m^2).    Other Active Problems  Mild hypokalemia  Low back pain with leg numbness -- possible spinal stenosis -- observe for now.  Other Pertinent History    Hospital day # 2  Mikey Bussing [facility] Pager [phone] 03/15/2014, 8:11 AM   The patient was seen and examined by me; notes, chart and tests reviewed and discussed with midlevel provider, other providers, patient, and family.      To contact Stroke Continuity provider, please refer to [email or web address] After hours, contact General Neurology

## 2014-03-15 NOTE — Progress Notes (Signed)
Utilization Review Completed.   Ramil Edgington, RN, BSN Nurse Case Manager  

## 2014-03-15 NOTE — Progress Notes (Signed)
[facility] PROGRESS NOTE  [name] BSJ:[id number] DOB: 22-Feb-1936 DOA: 03/13/2014  PCP: Gerrit Heck, MD  Brief HPI: [name] is a 78 y.o. female with a history of HTN, Asthma who presented to the ED with complaints of sudden onset of left sided weakness that lasted for 10 minutes. Her symptoms occurred while she was driving and she had a headache and numbness and weakness of her left upper and lower extremity.   Past medical history:  Past Medical History  Diagnosis Date  . Hypertension   . H/O seasonal allergies   . Asthma   . GERD (gastroesophageal reflux disease)   . Hx of colonic polyp   . Hemorrhoids, internal   . Hyperlipemia   . Asthma   . Allergy   . Arthritis   . Cataract   . Depression   . Heart murmur   . Diverticulosis 10-2011    Colonoscopy  . Hemorrhoids 10-2011    Colonoscopy  . 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    EGD    Consultants: Neurology  Procedures:  2D ECHO  pending  Carotid Dopplers  1-39% ICA stenosis. Vertebral artery flow is antegrade.   EEG Pending  Antibiotics: None  Subjective: Patient feels well. No more numbness or weakness. States that she has always felt the left side to be different than right. Was able to ambulate.   Objective: Vital Signs  Filed Vitals:   03/14/14 1738 03/14/14 2158 03/15/14 0107 03/15/14 0547  BP: 133/73 130/79 126/65 106/63  Pulse: 69 66 61 56  Temp: 98.2 F (36.8 C) 98.3 F (36.8 C) 98.3 F (36.8 C) 98 F (36.7 C)  TempSrc: Oral Oral Oral Oral  Resp: 18 18 16 16   Height:      Weight:      SpO2: 100% 98% 98% 100%    Intake/Output Summary (Last 24 hours) at 03/15/14 7654 Last data filed at 03/14/14 1738  Gross per 24 hour  Intake    720 ml  Output      0 ml  Net    720 ml   Filed Weights   03/13/14 1659  Weight: 64.864 kg (143 lb)    General appearance: alert, cooperative, appears stated age and no distress Resp: clear to auscultation  bilaterally Cardio: regular rate and rhythm, S1, S2 normal, no murmur, click, rub or gallop GI: soft, non-tender; bowel sounds normal; no masses,  no organomegaly Neurologic: No facial droop. Tongue is midline. Strength was 5/5 bilateral UE. Weakness noted in left lower extremity. No hyperreflexia. Straight leg raising test was unremarkable.  Lab Results:  Basic Metabolic Panel:  Recent Labs Lab 03/13/14 1706 03/15/14 0628  NA 139 142  K 3.8 3.6*  CL 99 104  CO2 26 26  GLUCOSE 78 89  BUN 21 16  CREATININE 0.98 0.94  CALCIUM 9.5 9.2   Liver Function Tests:  Recent Labs Lab 03/13/14 1706  AST 19  ALT 14  ALKPHOS 59  BILITOT 0.5  PROT 7.6  ALBUMIN 3.9   CBC:  Recent Labs Lab 03/13/14 1741 03/15/14 0628  WBC 4.2 3.5*  NEUTROABS 1.7  --   HGB 13.6 12.3  HCT 40.8 36.0  MCV 90.7 87.4  PLT 236 263   CBG:  Recent Labs Lab 03/14/14 1129  GLUCAP 87     Studies/Results: Ct Head (brain) Wo Contrast  03/13/2014   CLINICAL DATA:  Headache with left-sided body numbness today.  EXAM: CT HEAD WITHOUT CONTRAST  TECHNIQUE:  Contiguous axial images were obtained from the base of the skull through the vertex without intravenous contrast.  COMPARISON:  Head CT 08/15/2006.  FINDINGS: There is no evidence of acute intracranial hemorrhage, mass lesion, brain edema or extra-axial fluid collection. The ventricles and subarachnoid spaces are appropriately sized for age. There is no CT evidence of acute cortical infarction. Mild intracranial vascular calcifications are noted.  The visualized paranasal sinuses, mastoid air cells and middle ears are clear. The calvarium is intact.  IMPRESSION: Stable head CT.  No acute intracranial findings.   Electronically Signed   By: Camie Patience M.D.   On: 03/13/2014 18:15   Mri Brain Without Contrast  03/14/2014   CLINICAL DATA:  TIA. Sudden onset confusion/ disorientation with left arm and leg numbness. Headaches.  EXAM: MRI HEAD WITHOUT CONTRAST   MRA HEAD WITHOUT CONTRAST  TECHNIQUE: Multiplanar, multiecho pulse sequences of the brain and surrounding structures were obtained without intravenous contrast. Angiographic images of the head were obtained using MRA technique without contrast.  COMPARISON:  Head CT 03/13/2014  FINDINGS: MRI HEAD FINDINGS  There is no evidence of acute infarct, intracranial hemorrhage, mass, midline shift, or extra-axial fluid collection. Foci of T2 hyperintensity in the subcortical and deep cerebral white matter and pons are nonspecific but compatible with mild chronic small vessel ischemic disease. There is mild age-related cerebral atrophy.  Orbits are unremarkable. Paranasal sinuses and mastoid air cells are clear. Major intracranial vascular flow voids are preserved.  MRA HEAD FINDINGS  The visualized distal vertebral arteries are patent with the left being slightly larger than the right. PICA origins are patent. The basilar artery is tortuous without stenosis. AICA and SCA origins are patent. There is a patent left posterior communicating artery. The PCAs are unremarkable.  Internal carotid arteries are patent from skullbase to carotid termini without stenosis. ACAs and MCAs are unremarkable. Anterior communicating artery is patent. No intracranial aneurysm is identified.  IMPRESSION: 1. No acute intracranial abnormality. 2. Mild chronic small vessel ischemic disease. 3. Unremarkable head MRA.   Electronically Signed   By: Logan Bores   On: 03/14/2014 11:12   Mr Cervical Spine Wo Contrast  03/14/2014   CLINICAL DATA:  Left neck pain with left arm and leg paresthesias and headaches.  EXAM: MRI CERVICAL SPINE WITHOUT CONTRAST  TECHNIQUE: Multiplanar, multisequence MR imaging of the cervical spine was performed. No intravenous contrast was administered.  COMPARISON:  None.  FINDINGS: The cervical alignment is normal. There is no evidence of acute cervical spine fracture or paraspinous ligamentous injury. There are diffuse  degenerative changes throughout the cervical spine with mild endplate changes from N8-2 through C6-7. 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  The craniocervical junction appears normal. The cervical cord is normal in signal and caliber. There are bilateral vertebral artery flow voids.  C2-3: Mild uncinate spurring and facet hypertrophy. The foramina appear sufficiently patent. No cord deformity.  C3-4: Spondylosis with loss of disc height, posterior osteophytes and bilateral uncinate spurring. There is moderate foraminal narrowing bilaterally. No cord deformity.  C4-5: Spondylosis with loss of disc height, posterior osteophytes and bilateral uncinate spurring. There is moderate foraminal narrowing bilaterally. No cord deformity.  C5-6: Spondylosis with loss of disc height, posterior osteophytes and bilateral uncinate spurring. The AP diameter of the canal is narrowed to 7 mm without cord deformity. There is at least moderate biforaminal stenosis.  C6-7: Spondylosis with loss of disc height, posterior osteophytes and bilateral uncinate spurring. Moderate right-greater-than-left  foraminal narrowing is present. No cord deformity.  C7-T1: Asymmetric facet hypertrophy on the left contributes to a slight anterolisthesis. No cord deformity or significant foraminal compromise.  IMPRESSION: 1. 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 2. The CSF surrounding the cord is effaced at C5-6. There is no cord flattening or abnormal signal. 3. Osteophytes contribute to at least moderate foraminal narrowing at multiple levels as detailed above. 4. No definite acute findings.   Electronically Signed   By: Camie Patience M.D.   On: 03/14/2014 10:50   Mr Jodene Nam Head/brain Wo Cm  03/14/2014   CLINICAL DATA:  TIA. Sudden onset confusion/ disorientation with left arm and leg numbness. Headaches.  EXAM: MRI HEAD WITHOUT  CONTRAST  MRA HEAD WITHOUT CONTRAST  TECHNIQUE: Multiplanar, multiecho pulse sequences of the brain and surrounding structures were obtained without intravenous contrast. Angiographic images of the head were obtained using MRA technique without contrast.  COMPARISON:  Head CT 03/13/2014  FINDINGS: MRI HEAD FINDINGS  There is no evidence of acute infarct, intracranial hemorrhage, mass, midline shift, or extra-axial fluid collection. Foci of T2 hyperintensity in the subcortical and deep cerebral white matter and pons are nonspecific but compatible with mild chronic small vessel ischemic disease. There is mild age-related cerebral atrophy.  Orbits are unremarkable. Paranasal sinuses and mastoid air cells are clear. Major intracranial vascular flow voids are preserved.  MRA HEAD FINDINGS  The visualized distal vertebral arteries are patent with the left being slightly larger than the right. PICA origins are patent. The basilar artery is tortuous without stenosis. AICA and SCA origins are patent. There is a patent left posterior communicating artery. The PCAs are unremarkable.  Internal carotid arteries are patent from skullbase to carotid termini without stenosis. ACAs and MCAs are unremarkable. Anterior communicating artery is patent. No intracranial aneurysm is identified.  IMPRESSION: 1. No acute intracranial abnormality. 2. Mild chronic small vessel ischemic disease. 3. Unremarkable head MRA.   Electronically Signed   By: Logan Bores   On: 03/14/2014 11:12    Medications:  Scheduled: . aspirin  325 mg Oral Daily  . docusate sodium  100 mg Oral BID  . enoxaparin (LOVENOX) injection  40 mg Subcutaneous QHS  . losartan  50 mg Oral Daily   And  . hydrochlorothiazide  12.5 mg Oral Daily  . mometasone-formoterol  2 puff Inhalation BID  . montelukast  10 mg Oral QHS  . pantoprazole  40 mg Oral Daily  . polyethylene glycol  17 g Oral Daily  . potassium chloride  40 mEq Oral Once  . saccharomyces boulardii   500 mg Oral q morning - 10a  . simvastatin  20 mg Oral q1800   Continuous:  JKD:[id number], albuterol, cycloSPORINE, diphenhydrAMINE  Assessment/Plan:  Principal Problem:   TIA (transient ischemic attack) Active Problems:   HYPERTENSION   Intrinsic asthma, unspecified    Left-sided weakness/Possible TIA MRI reports reviewed. No clear etiology. Neurology following. Could have been a TIA but patient also has chronic symptoms. Continue Aspirin and statin. ECHO is pending. EEG was ordered as well. But this can be pursued as OP if cannot be done today. Await physical therapy evaluation. No OT needs. She may benefit from MRI of the lumbar spine as the last one was done about 10 years ago, but this can be pursued as an outpatient as her symptoms are chronic and she is able to ambulate. LDL was in 118.  History  of essential hypertension  Continue Lisinopril /HCTZ   History of Intrinsic asthma, unspecified  Stable. Continue Albuterol, NEBs, Dulera, Albuterol QID   Constipation Miralax.  DVT Prophylaxis: Enoxaparin    Code Status: Full code  Family Communication: Discussed in detail with the patient  Disposition Plan: Await PT evaluation. ECHO and EEG pending. Possible DC later today.    LOS: 2 days   [facility] Pager [phone] 03/15/2014, 8:06 AM  If 8PM-8AM, please contact night-coverage at [email or web address], password [facility]

## 2014-03-15 NOTE — Discharge Summary (Addendum)
[facility]  Physician Discharge Summary   Patient ID: [name] MRN: [id number] DOB/AGE: 02-19-1936 78 y.o.  Admit date: 03/13/2014 Discharge date: 03/15/2014  PCP: Gerrit Heck, MD  DISCHARGE DIAGNOSES:  Principal Problem:   TIA (transient ischemic attack) Active Problems:   HYPERTENSION   Intrinsic asthma, unspecified   RECOMMENDATIONS FOR OUTPATIENT FOLLOW UP: 1. EEG to be done as outpatient. It has been ordered via epic. 2. Please follow up on her symptoms of chronic back pain and left leg weakness.  DISCHARGE CONDITION: fair  Diet recommendation: Heart healthy  Filed Weights   03/13/14 1659  Weight: 64.864 kg (143 lb)    INITIAL HISTORY: [name] is a 78 y.o. female with a history of HTN, Asthma who presented to the ED with complaints of sudden onset of left sided weakness that lasted for 10 minutes. Her symptoms occurred while she was driving and she had a headache and numbness and weakness of her left upper and lower extremity.   Consultations:  Neurology  Procedures: Carotid Dopplers  1-39% ICA stenosis. Vertebral artery flow is antegrade.   2D ECHO  Study Conclusions  - Left ventricle: The cavity size was normal. Wall thickness was normal. Systolic function was normal. The estimated ejection fraction was in the range of 60% to 65%. Wall motion was normal; there were no regional wall motion abnormalities. Doppler parameters are consistent with abnormal left ventricular relaxation (grade 1 diastolic dysfunction). - Aortic valve: Mildly calcified annulus. Trileaflet. There was no significant regurgitation. - Mitral valve: Mildly thickened leaflets . There was trivial regurgitation. - Right atrium: The Eustachian valve appeared prominant. - Atrial septum: No defect or patent foramen ovale was identified. - Tricuspid valve: There was mild regurgitation. - Pulmonary arteries: PA peak pressure: 18 mm Hg (S). - Pericardium,  extracardiac: There was no pericardial effusion. Impressions: Normal LV wall thickness with LVEF 70-62%, grade 1 diastolic dysfunction. Mildly thickened mitral leaflets. Eustachian valve noted right atrium. No obvious PFO or ASD. Normal PASP 18 mmHg.   HOSPITAL COURSE:   Left-sided weakness/Possible TIA  She was admitted for further evaluation. TIA was a possibility. MRI reports reviewed and no stroke was noted. No significant atherosclerosis was noted. Neurology was consulted and plan is for EEG as outpatient. Carotid dopplers and ECHo as mentioned above. Could have been a TIA but patient also has chronic symptoms. Her numbness and tingling in left side has resolved. She still feels the left side is "different' but admits that this has been present for many months. Continue Aspirin and statin for now. Patient was seen by PT and OT and she was noted to be independent without therapy needs. Regarding her left leg weakness: she may benefit from MRI of the lumbar spine as the last one was done about 10 years ago, but this can be pursued as an outpatient as her symptoms are chronic and she is able to ambulate. LDL was 118.   History of essential hypertension  Continue Lisinopril /HCTZ   History of Intrinsic asthma, unspecified  Stable. Continue Albuterol, NEBs, Dulera, Albuterol QID   Constipation  Miralax was added to her regimen.  Patient is stable. [place] for discharge and will need close follow up with PCP. EEG as outpatient.Marland Kitchen   PERTINENT LABS:  The results of significant diagnostics from this hospitalization (including imaging, microbiology, ancillary and laboratory) are listed below for reference.     Labs: Basic Metabolic Panel:  Recent Labs Lab 03/13/14 1706 03/15/14 0628  NA 139  142  K 3.8 3.6*  CL 99 104  CO2 26 26  GLUCOSE 78 89  BUN 21 16  CREATININE 0.98 0.94  CALCIUM 9.5 9.2   Liver Function Tests:  Recent Labs Lab 03/13/14 1706  AST 19  ALT 14  ALKPHOS 59    BILITOT 0.5  PROT 7.6  ALBUMIN 3.9   CBC:  Recent Labs Lab 03/13/14 1741 03/15/14 0628  WBC 4.2 3.5*  NEUTROABS 1.7  --   HGB 13.6 12.3  HCT 40.8 36.0  MCV 90.7 87.4  PLT 236 263   CBG:  Recent Labs Lab 03/14/14 1129  GLUCAP 87     IMAGING STUDIES Ct Head (brain) Wo Contrast  03/13/2014   CLINICAL DATA:  Headache with left-sided body numbness today.  EXAM: CT HEAD WITHOUT CONTRAST  TECHNIQUE: Contiguous axial images were obtained from the base of the skull through the vertex without intravenous contrast.  COMPARISON:  Head CT 08/15/2006.  FINDINGS: There is no evidence of acute intracranial hemorrhage, mass lesion, brain edema or extra-axial fluid collection. The ventricles and subarachnoid spaces are appropriately sized for age. There is no CT evidence of acute cortical infarction. Mild intracranial vascular calcifications are noted.  The visualized paranasal sinuses, mastoid air cells and middle ears are clear. The calvarium is intact.  IMPRESSION: Stable head CT.  No acute intracranial findings.   Electronically Signed   By: Camie Patience M.D.   On: 03/13/2014 18:15   Mri Brain Without Contrast  03/14/2014   CLINICAL DATA:  TIA. Sudden onset confusion/ disorientation with left arm and leg numbness. Headaches.  EXAM: MRI HEAD WITHOUT CONTRAST  MRA HEAD WITHOUT CONTRAST  TECHNIQUE: Multiplanar, multiecho pulse sequences of the brain and surrounding structures were obtained without intravenous contrast. Angiographic images of the head were obtained using MRA technique without contrast.  COMPARISON:  Head CT 03/13/2014  FINDINGS: MRI HEAD FINDINGS  There is no evidence of acute infarct, intracranial hemorrhage, mass, midline shift, or extra-axial fluid collection. Foci of T2 hyperintensity in the subcortical and deep cerebral white matter and pons are nonspecific but compatible with mild chronic small vessel ischemic disease. There is mild age-related cerebral atrophy.  Orbits are  unremarkable. Paranasal sinuses and mastoid air cells are clear. Major intracranial vascular flow voids are preserved.  MRA HEAD FINDINGS  The visualized distal vertebral arteries are patent with the left being slightly larger than the right. PICA origins are patent. The basilar artery is tortuous without stenosis. AICA and SCA origins are patent. There is a patent left posterior communicating artery. The PCAs are unremarkable.  Internal carotid arteries are patent from skullbase to carotid termini without stenosis. ACAs and MCAs are unremarkable. Anterior communicating artery is patent. No intracranial aneurysm is identified.  IMPRESSION: 1. No acute intracranial abnormality. 2. Mild chronic small vessel ischemic disease. 3. Unremarkable head MRA.   Electronically Signed   By: Logan Bores   On: 03/14/2014 11:12   Mr Cervical Spine Wo Contrast  03/14/2014   CLINICAL DATA:  Left neck pain with left arm and leg paresthesias and headaches.  EXAM: MRI CERVICAL SPINE WITHOUT CONTRAST  TECHNIQUE: Multiplanar, multisequence MR imaging of the cervical spine was performed. No intravenous contrast was administered.  COMPARISON:  None.  FINDINGS: The cervical alignment is normal. There is no evidence of acute cervical spine fracture or paraspinous ligamentous injury. There are diffuse degenerative changes throughout the cervical spine with mild endplate changes from E9-9 through C6-7. 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  The craniocervical junction appears normal. The cervical cord is normal in signal and caliber. There are bilateral vertebral artery flow voids.  C2-3: Mild uncinate spurring and facet hypertrophy. The foramina appear sufficiently patent. No cord deformity.  C3-4: Spondylosis with loss of disc height, posterior osteophytes and bilateral uncinate spurring. There is moderate foraminal narrowing bilaterally. No cord deformity.  C4-5: Spondylosis with loss of disc height, posterior  osteophytes and bilateral uncinate spurring. There is moderate foraminal narrowing bilaterally. No cord deformity.  C5-6: Spondylosis with loss of disc height, posterior osteophytes and bilateral uncinate spurring. The AP diameter of the canal is narrowed to 7 mm without cord deformity. There is at least moderate biforaminal stenosis.  C6-7: Spondylosis with loss of disc height, posterior osteophytes and bilateral uncinate spurring. Moderate right-greater-than-left foraminal narrowing is present. No cord deformity.  C7-T1: Asymmetric facet hypertrophy on the left contributes to a slight anterolisthesis. No cord deformity or significant foraminal compromise.  IMPRESSION: 1. 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 2. The CSF surrounding the cord is effaced at C5-6. There is no cord flattening or abnormal signal. 3. Osteophytes contribute to at least moderate foraminal narrowing at multiple levels as detailed above. 4. No definite acute findings.   Electronically Signed   By: Camie Patience M.D.   On: 03/14/2014 10:50   Mr Jodene Nam Head/brain Wo Cm  03/14/2014   CLINICAL DATA:  TIA. Sudden onset confusion/ disorientation with left arm and leg numbness. Headaches.  EXAM: MRI HEAD WITHOUT CONTRAST  MRA HEAD WITHOUT CONTRAST  TECHNIQUE: Multiplanar, multiecho pulse sequences of the brain and surrounding structures were obtained without intravenous contrast. Angiographic images of the head were obtained using MRA technique without contrast.  COMPARISON:  Head CT 03/13/2014  FINDINGS: MRI HEAD FINDINGS  There is no evidence of acute infarct, intracranial hemorrhage, mass, midline shift, or extra-axial fluid collection. Foci of T2 hyperintensity in the subcortical and deep cerebral white matter and pons are nonspecific but compatible with mild chronic small vessel ischemic disease. There is mild age-related cerebral atrophy.   Orbits are unremarkable. Paranasal sinuses and mastoid air cells are clear. Major intracranial vascular flow voids are preserved.  MRA HEAD FINDINGS  The visualized distal vertebral arteries are patent with the left being slightly larger than the right. PICA origins are patent. The basilar artery is tortuous without stenosis. AICA and SCA origins are patent. There is a patent left posterior communicating artery. The PCAs are unremarkable.  Internal carotid arteries are patent from skullbase to carotid termini without stenosis. ACAs and MCAs are unremarkable. Anterior communicating artery is patent. No intracranial aneurysm is identified.  IMPRESSION: 1. No acute intracranial abnormality. 2. Mild chronic small vessel ischemic disease. 3. Unremarkable head MRA.   Electronically Signed   By: Logan Bores   On: 03/14/2014 11:12    DISCHARGE EXAMINATION: See progreso note from earlier today.  DISPOSITION: Home  Discharge Instructions   Diet - low sodium heart healthy    Complete by:  As directed      Discharge instructions    Complete by:  As directed   EEG will need to be done as outpatient. If you dont get a call from EEG department please ask your PCP's office to arrange.     Increase activity slowly    Complete by:  As directed            ALLERGIES:  Allergies  Allergen Reactions  . Cepacol Shortness Of Breath    Difficulty breathing  . Lactose Intolerance (Gi) Shortness Of Breath    All dairy  Causes shortness of breath  . Penicillins Shortness Of Breath and Swelling    Tongue and eyes swell    Current Discharge Medication List    START taking these medications   Details  aspirin EC 325 MG tablet Take 1 tablet (325 mg total) by mouth daily. Qty: 30 tablet, Refills: 2    polyethylene glycol (MIRALAX / GLYCOLAX) packet Take 17 g by mouth 2 (two) times daily. Qty: 60 each, Refills: 0    simvastatin (ZOCOR) 20 MG tablet Take 1 tablet (20 mg total) by mouth daily at 6 PM. Qty: 30  tablet, Refills: 2      CONTINUE these medications which have NOT CHANGED   Details  albuterol (PROVENTIL) (2.5 MG/3ML) 0.083% nebulizer solution Take 2.5 mg by nebulization every 6 (six) hours as needed for wheezing.    Albuterol Sulfate (PROAIR RESPICLICK) 086 (90 BASE) MCG/ACT AEPB Inhale 2 puffs into the lungs every 6 (six) hours as needed. Qty: 1 each, Refills: 3    Alpha-D-Galactosidase (BEANO PO) Take 1 tablet by mouth as needed (gas and bloating).     Ascorbic Acid (VITAMIN C) 1000 MG tablet Take 1,000 mg by mouth daily.    B Complex Vitamins (B-COMPLEX/B-12 PO) Take by mouth daily.    cycloSPORINE (RESTASIS) 0.05 % ophthalmic emulsion Place 1 drop into both eyes at bedtime as needed (for dry eyes or irritation).    diphenhydrAMINE (BENADRYL) 25 MG tablet Take 1 tablet (25 mg total) by mouth every 6 (six) hours as needed for itching (Rash). Qty: 30 tablet, Refills: 0    Docusate Calcium (STOOL SOFTENER PO) Take by mouth as needed.    esomeprazole (NEXIUM) 20 MG capsule Take 20 mg by mouth daily at 12 noon.    losartan-hydrochlorothiazide (HYZAAR) 50-12.5 MG per tablet Take 1 tablet by mouth daily.     mometasone-formoterol (DULERA) 100-5 MCG/ACT AERO Inhale 2 puffs into the lungs 2 (two) times daily. Qty: 1 Inhaler, Refills: 0    montelukast (SINGULAIR) 10 MG tablet Take 10 mg by mouth at bedtime.    Multiple Vitamin (MULTIVITAMIN) tablet Take 1 tablet by mouth daily.    Probiotic Product (PROBIOTIC PO) Take by mouth daily. Takes 2 capsules daily      STOP taking these medications     predniSONE (DELTASONE) 10 MG tablet        Follow-up Information   Schedule an appointment as soon as possible for a visit with Gerrit Heck, MD.   Specialty:  Family Medicine   Contact information:   [place] [address] [phone]       Follow up with Xu,Jindong, MD. Schedule an appointment as soon as possible for a visit in 1 month. (post  hospitalization follow up)    Specialty:  Neurology   Contact information:   [address] [place] [address] [phone]      Patient has appointment with her PCP on 9/28.  TOTAL DISCHARGE TIME: 35 mins  [facility] Pager [phone]  03/15/2014, 4:20 PM

## 2014-03-16 ENCOUNTER — Other Ambulatory Visit (HOSPITAL_COMMUNITY): Payer: Self-pay | Admitting: Respiratory Therapy

## 2014-03-19 ENCOUNTER — Telehealth: Payer: Self-pay | Admitting: Nurse Practitioner

## 2014-03-19 NOTE — Telephone Encounter (Signed)
Called and left message for Christy to call back for sooner appt.for patient. Also called patient and left VM message to return call for sooner appt.

## 2014-03-19 NOTE — Telephone Encounter (Signed)
Christy returning call to [facility], please call and advise at [phone].

## 2014-03-19 NOTE — Telephone Encounter (Signed)
Alyse Low, one of the nurses at [facility] calling to get a sooner appointment for patient due to complications, please return call and advise.

## 2014-03-20 ENCOUNTER — Ambulatory Visit (HOSPITAL_COMMUNITY)
Admission: RE | Admit: 2014-03-20 | Discharge: 2014-03-20 | Disposition: A | Discharge status: Home / Self Care | Payer: Medicare HMO | Source: Ambulatory Visit | Attending: Neurology | Admitting: Neurology

## 2014-03-20 DIAGNOSIS — G459 Transient cerebral ischemic attack, unspecified: Secondary | ICD-10-CM | POA: Diagnosis not present

## 2014-03-20 DIAGNOSIS — M6281 Muscle weakness (generalized): Secondary | ICD-10-CM | POA: Insufficient documentation

## 2014-03-20 DIAGNOSIS — R200 Anesthesia of skin: Secondary | ICD-10-CM | POA: Diagnosis not present

## 2014-03-20 NOTE — Procedures (Signed)
History: 78 year old female with transient left-sided numbness and weakness  Sedation: None  Technique: This is a 17 channel routine scalp EEG performed at the bedside with bipolar and monopolar montages arranged in accordance to the international 10/20 system of electrode placement. One channel was dedicated to EKG recording.    Background: There is a well defined posterior dominant rhythm of 8.5 Hz that attenuates with eye opening. The patient is waking through the entire recording, with no drowsiness or sleep recorded  Photic stimulation: Physiologic driving is present  EEG Abnormalities: None  Clinical Interpretation: This normal EEG is recorded in the waking state. There was no sleep recorded. There was no seizure or seizure predisposition recorded on this study. Please note that a normal EEG does not preclude the diagnosis of epilepsy. If further evaluation is desired, a sleep deprived EEG can sometimes have higher yield.  Roland Rack, MD [facility] [phone]  If 7pm- 7am, please page neurology on call as listed in [place].

## 2014-03-20 NOTE — Progress Notes (Signed)
EEG Completed; Results Pending  

## 2014-03-23 ENCOUNTER — Telehealth: Payer: Self-pay | Admitting: Nurse Practitioner

## 2014-03-23 NOTE — Telephone Encounter (Signed)
Called patient to resched appt per Jeani Hawking to Dr Erlinda Hong schedule for 03/24/14 @9 :00,confirmed with patient

## 2014-03-24 ENCOUNTER — Ambulatory Visit (INDEPENDENT_AMBULATORY_CARE_PROVIDER_SITE_OTHER): Payer: Commercial Managed Care - HMO | Admitting: Neurology

## 2014-03-24 ENCOUNTER — Encounter: Payer: Self-pay | Admitting: Neurology

## 2014-03-24 VITALS — BP 102/75 | HR 70 | Ht 59.0 in | Wt 142.2 lb

## 2014-03-24 DIAGNOSIS — E785 Hyperlipidemia, unspecified: Secondary | ICD-10-CM | POA: Insufficient documentation

## 2014-03-24 DIAGNOSIS — R51 Headache: Secondary | ICD-10-CM

## 2014-03-24 DIAGNOSIS — G459 Transient cerebral ischemic attack, unspecified: Secondary | ICD-10-CM

## 2014-03-24 DIAGNOSIS — R519 Headache, unspecified: Secondary | ICD-10-CM

## 2014-03-24 NOTE — Telephone Encounter (Signed)
Called and informed that patient was scheduled  03/24/14, visit completed, she verbalized understanding

## 2014-03-24 NOTE — Progress Notes (Signed)
NEUROLOGY CLINIC NEW PATIENT NOTE  NAME: [name] DOB: 04/23/1936  I saw [name] as a new patient in the neurovascular clinic today regarding  Chief Complaint  Patient presents with  . Cerebrovascular Accident    NP#4  .  HPI: [name] is a 78 y.o. female with PMH of HTN, HLD, GERD, depression and asthma who presents as a new patient for confusion episode.   On 03/13/14, she did not take medication or have breakfast in the morning. She started to have headache in the morning around 9am, which was her typical headache at the top of her head with numbness. While driving at 15QM, all of sudden she felt confused, not knowing where she was, everything looked strange to her, she felt lost. Do not know how long it last but she finally recognized the road and drove home. She denies any LOC, weakness or numbness, vision changes or speech difficulty. At home she still felt very foggy, called her doctor and advised to go to hospital. She initially refused to go to but eventually admitted to [facility] the same day. MRI was negative for stroke. In ER she still have headache, and was given tylenol and headache resolved. 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 She does have neck pain and LBP in the past, and the left arm and leg pain were chronic and was taking pain medications. She was discharged with aspirin and zocor.   She stated that she has hx of headache, very mild, usually at the center of the head, with numbness feeling, a couple of times a day. She will also get lightheadedness with the headache once every two days, and at that time she will need to hold onto things on walking. She did not take any HA medications.  She is on losartan and HCTZ for BP control and today BP 102/75.   Past Medical History  Diagnosis Date  . Hypertension   . H/O seasonal allergies   . Asthma   . GERD  (gastroesophageal reflux disease)   . Hx of colonic polyp   . Hemorrhoids, internal   . Hyperlipemia   . Asthma   . Allergy   . Arthritis   . Cataract   . Depression   . Heart murmur   . Diverticulosis 10-2011    Colonoscopy  . Hemorrhoids 10-2011    Colonoscopy  . 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    EGD   Past Surgical History  Procedure Laterality Date  . Cesarean section  1962  . Laparoscopic cholecystectomy  1995   Family History  Problem Relation Age of Onset  . Colon cancer Sister 41  . Stomach cancer Neg Hx   . Breast cancer      Niece  . Irritable bowel syndrome Sister    Current Outpatient Prescriptions  Medication Sig Dispense Refill  . Albuterol Sulfate (PROAIR RESPICLICK) 086 (90 BASE) MCG/ACT AEPB Inhale 2 puffs into the lungs every 6 (six) hours as needed.  1 each  3  . Alpha-D-Galactosidase (BEANO PO) Take 1 tablet by mouth as needed (gas and bloating).       . Ascorbic Acid (VITAMIN C) 1000 MG tablet Take 1,000 mg by mouth daily.      Marland Kitchen aspirin EC 325 MG tablet Take 1 tablet (325 mg total) by mouth daily.  30 tablet  2  . B Complex Vitamins (  B-COMPLEX/B-12 PO) Take by mouth daily.      . cycloSPORINE (RESTASIS) 0.05 % ophthalmic emulsion Place 1 drop into both eyes at bedtime as needed (for dry eyes or irritation).      Mariane Baumgarten Calcium (STOOL SOFTENER PO) Take by mouth as needed.      Marland Kitchen losartan-hydrochlorothiazide (HYZAAR) 50-12.5 MG per tablet Take 1 tablet by mouth daily.       . mometasone-formoterol (DULERA) 100-5 MCG/ACT AERO Inhale 2 puffs into the lungs 2 (two) times daily.  1 Inhaler  0  . montelukast (SINGULAIR) 10 MG tablet Take 10 mg by mouth at bedtime.      . Multiple Vitamin (MULTIVITAMIN) tablet Take 1 tablet by mouth daily.      . pantoprazole (PROTONIX) 40 MG tablet Take 40 mg by mouth daily.      . polyethylene glycol (MIRALAX / GLYCOLAX) packet Take 17 g by mouth 2 (two) times daily.  60 each  0  . Probiotic Product (PROBIOTIC PO) Take by  mouth daily. Takes 2 capsules daily      . simvastatin (ZOCOR) 20 MG tablet Take 1 tablet (20 mg total) by mouth daily at 6 PM.  30 tablet  2   No current facility-administered medications for this visit.   Allergies  Allergen Reactions  . Cepacol Shortness Of Breath    Difficulty breathing  . Lactose Intolerance (Gi) Shortness Of Breath    All dairy  Causes shortness of breath  . Penicillins Shortness Of Breath and Swelling    Tongue and eyes swell   History   Social History  . Marital Status: Widowed    Spouse Name: N/A    Number of Children: 1  . Years of Education: college   Occupational History  . Customer Service   .     Social History Main Topics  . Smoking status: Former Smoker -- 0.50 packs/day    Types: Cigarettes    Quit date: 09/28/1959  . Smokeless tobacco: Never Used  . Alcohol Use: No  . Drug Use: No  . Sexual Activity: Not on file   Other Topics Concern  . Not on file   Social History Narrative  . No narrative on file    Review of Systems Full 14 system review of systems performed and notable only for those listed, all others are neg:  Constitutional: N/A  Cardiovascular: palpitation  Ear/Nose/Throat: ear pain and ringing in ears  Skin: N/A  Eyes: N/A  Respiratory: SOB, chest tightness  Gastroitestinal: constipation  Hematology/Lymphatic: N/A  Endocrine: N/A  Musculoskeletal: neck pain, back pain, joint pain, neck stiffness  Allergy/Immunology: N/A  Neurological: memory loss, dizziness, headache, numbness, weakness  Psychiatric: N/A   Physical Exam  Filed Vitals:   03/24/14 0845  BP: 102/75  Pulse: 70    General - Well nourished, well developed, in no apparent distress.  Ophthalmologic - not able to see through.  Cardiovascular - Regular rate and rhythm with no murmur. Carotid pulses were 2+ without bruits .   Neck - supple, no nuchal rigidity .  Mental Status -  Level of arousal and orientation to time, place, and person  were intact. Language including expression, naming, repetition, comprehension was assessed and found intact.  Cranial Nerves II - XII - II - Visual field intact OU. III, IV, VI - Extraocular movements intact. V - Facial sensation intact bilaterally. VII - Facial movement intact bilaterally. VIII - Hearing & vestibular intact bilaterally. X - Palate elevates  symmetrically. XI - Chin turning & shoulder shrug intact bilaterally. XII - Tongue protrusion intact.  Motor Strength - The patient's strength was normal in all extremities and pronator drift was absent.  Bulk was normal and fasciculations were absent.   Motor Tone - Muscle tone was assessed at the neck and appendages and was normal.  Reflexes - The patient's reflexes were normal in all extremities and she had no pathological reflexes.  Sensory - Light touch, temperature/pinprick and Romberg testing were assessed and were normal.    Coordination - The patient had normal movements in the hands and feet with no ataxia or dysmetria.  Tremor was absent.  Gait and Station - The patient's transfers, posture, gait, station, and turns were observed as normal.   Imaging Ct Head (brain) Wo Contrast  03/13/2014  Stable head CT. No acute intracranial findings.  MRI / MRA Brain Without Contrast  03/14/2014  1. No acute intracranial abnormality.  2. Mild chronic small vessel ischemic disease.  3. Unremarkable head MRA.  Mr Cervical Spine Wo Contrast  03/14/2014  1. 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  2. The CSF surrounding the cord is effaced at C5-6. There is no cord flattening or abnormal signal.  3. Osteophytes contribute to at least moderate foraminal narrowing at multiple levels as detailed above.  4. No definite acute findings.  EEG - normal.  Lab Review Component     Latest Ref Rng 03/13/2014  Cholesterol     0 - 200  mg/dL 187  Triglycerides     <150 mg/dL 59  HDL     >39 mg/dL 57  Total CHOL/HDL Ratio      3.3  VLDL     0 - 40 mg/dL 12  LDL (calc)     0 - 99 mg/dL 118 (H)  Hemoglobin A1C     <5.7 % 5.8 (H)  Mean Plasma Glucose     <117 mg/dL 120 (H)     Assessment and Plan:   In summary, [name] is a 78 y.o. female with PMH of HTN, HLD, GERD, asthma, depression present a confusion episode. She was driving but started to have confusion and not knowing the way she was going. Also had HA before the episode, along with left arm and leg painful sensation. Confusion resolved in ER. MRI and MRA negative for stroke or vessel stenosis. EEG normal. HA resolved after tylenol. Episode most likely complicated migraine but due to stroke risk factors, TIA can not be completely ruled out. Her LDL 118, will continue ASA and zocor.  - continue ASA and zocor for stroke prevention - recommend tylenol for HA abortive treatment. - follow up with PCP for stroke risk factor modification - also follow up with PCP for neck pain and consider PT for DJD - RTC in 3 months.  Thank you very much for the opportunity to participate in the care of this patient.  Please do not hesitate to call if any questions or concerns arise.  No orders of the defined types were placed in this encounter.    Patient Instructions  - your episode the other day most likely complicated migraine but we can not rule out transient ischemia attack.  - recommend to take tylenol at the beginning of headache to abort the headache - continue ASA and zocor for stroke prevention - follow up with PCP for stroke risk factor modification - also follow up  with PCP for indigestion of food and neck pain. May consider physical therapy for neck pain - follow up in 3 months.   Rosalin Hawking, MD PhD [facility] [address] [place], [place] [address] [phone]

## 2014-03-24 NOTE — Patient Instructions (Signed)
-   your episode the other day most likely complicated migraine but we can not rule out transient ischemia attack.  - recommend to take tylenol at the beginning of headache to abort the headache - continue ASA and zocor for stroke prevention - follow up with PCP for stroke risk factor modification - also follow up with PCP for indigestion of food and neck pain. May consider physical therapy for neck pain - follow up in 3 months.

## 2014-04-20 ENCOUNTER — Ambulatory Visit: Payer: Medicare HMO | Admitting: Pulmonary Disease

## 2014-04-21 ENCOUNTER — Ambulatory Visit: Payer: Self-pay | Admitting: Nurse Practitioner

## 2014-07-02 ENCOUNTER — Ambulatory Visit: Payer: Commercial Managed Care - HMO | Admitting: Neurology

## 2014-07-15 ENCOUNTER — Ambulatory Visit (INDEPENDENT_AMBULATORY_CARE_PROVIDER_SITE_OTHER): Payer: Commercial Managed Care - HMO | Admitting: Neurology

## 2014-07-15 ENCOUNTER — Encounter: Payer: Self-pay | Admitting: Neurology

## 2014-07-15 VITALS — BP 117/74 | HR 67 | Ht 59.0 in | Wt 138.8 lb

## 2014-07-15 DIAGNOSIS — I10 Essential (primary) hypertension: Secondary | ICD-10-CM

## 2014-07-15 DIAGNOSIS — R519 Headache, unspecified: Secondary | ICD-10-CM

## 2014-07-15 DIAGNOSIS — G459 Transient cerebral ischemic attack, unspecified: Secondary | ICD-10-CM

## 2014-07-15 DIAGNOSIS — R51 Headache: Secondary | ICD-10-CM

## 2014-07-15 DIAGNOSIS — E785 Hyperlipidemia, unspecified: Secondary | ICD-10-CM

## 2014-07-15 MED ORDER — SIMVASTATIN 20 MG PO TABS: 20.0000 mg | ORAL_TABLET | Freq: Every day | ORAL | Status: DC

## 2014-07-15 MED ORDER — NORTRIPTYLINE HCL 10 MG PO CAPS: 10.0000 mg | ORAL_CAPSULE | Freq: Every day | ORAL | Status: DC

## 2014-07-15 MED ORDER — NORTRIPTYLINE HCL 25 MG PO CAPS: 25.0000 mg | ORAL_CAPSULE | Freq: Every day | ORAL | Status: DC

## 2014-07-15 NOTE — Patient Instructions (Signed)
-   continue ASA for stroke prevention - reorder zocor for stroke prevention and high cholesterol - will prescribe nortriptyline for headache prevention to see if it is effective for you. - check BP at home - Follow up with your primary care physician for stroke risk factor modification. Recommend maintain blood pressure goal <130/80, diabetes with hemoglobin A1c goal below 6.5% and lipids with LDL cholesterol goal below 70 mg/dL.  - read the education journal of migraine and do accordingly to minimize the headache frequency. - follow up in 3 months.

## 2014-07-15 NOTE — Progress Notes (Signed)
STROKE NEUROLOGY FOLLOW UP NOTE  NAME: [name] DOB: Mar 17, 1936  REASON FOR VISIT: stroke follow up HISTORY FROM: chart and pt  Today we had the pleasure of seeing [name] in follow-up at our Neurology Clinic. Pt was accompanied by no one.   History Summary [name] is a 79 y.o. female with PMH of HTN, HLD, GERD, depression and asthma who presents as a new patient for confusion episode.  On 03/13/14, she did not take medication or have breakfast in the morning. She started to have headache in the morning around 9am, which was her typical headache at the top of her head with numbness. While driving at 09NA, all of sudden she felt confused, not knowing where she was, everything looked strange to her, she felt lost. Do not know how long it last but she finally recognized the road and drove home. She denies any LOC, weakness or numbness, vision changes or speech difficulty. At home she still felt very foggy, called her doctor and advised to go to hospital. She initially refused to go to but eventually admitted to [facility] the same day. MRI was negative for stroke. In ER she still have headache, and was given tylenol and headache resolved. 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 She does have neck pain and LBP in the past, and the left arm and leg pain were chronic and was taking pain medications. She was discharged with aspirin and zocor.  She stated that she has hx of headache, very mild, usually at the center of the head, with numbness feeling, a couple of times a day. She will also get lightheadedness with the headache once every two days, and at that time she will need to hold onto things on walking. She did not take any HA medications. She is on losartan and HCTZ for BP control and today BP 102/75.   Interval History During the interval time, the patient has been doing better. She  still has the headache, but if take the tylenol as early as possible, the headache lasting short, about only 30 minutes. She still has every day headache, sometimes wake up with it sometimes during the morning or afternoon, with photophobia, phonophobia, nausea but no vomiting. The headache commonly starts from the neck to the head, sometimes at the top of head. She also feels tenderness on touch the top of her head. She complains of neck pain and lower back intermittently.  REVIEW OF SYSTEMS: Full 14 system review of systems performed and notable only for those listed below and in HPI above, all others are negative:  Constitutional: N/A  Cardiovascular: N/A  Ear/Nose/Throat: N/A  Skin: N/A  Eyes: N/A  Respiratory: N/A  Gastroitestinal: N/A  Genitourinary: N/A Hematology/Lymphatic: N/A  Endocrine: N/A  Musculoskeletal: N/A  Allergy/Immunology: N/A  Neurological: N/A  Psychiatric: N/A  The following represents the patient's updated allergies and side effects list: Allergies  Allergen Reactions  . Cepacol Shortness Of Breath    Difficulty breathing  . Lactose Intolerance (Gi) Shortness Of Breath    All dairy  Causes shortness of breath  . Penicillins Shortness Of Breath and Swelling    Tongue and eyes swell    The neurologically relevant items on the patient's problem list were reviewed on today's visit.  Neurologic Examination  A problem focused neurological exam (12 or more points of the single system neurologic examination, vital  signs counts as 1 point, cranial nerves count for 8 points) was performed.  Blood pressure 117/74, pulse 67, height 4\' 11"  (1.499 m), weight 138 lb 12.8 oz (62.959 kg).  General - Well nourished, well developed, in no apparent distress.  Ophthalmologic - Sharp disc margins OU.  Cardiovascular - Regular rate and rhythm with no murmur.  Musculoskeletal - mild tenderness on touch at occipital nerve bilaterally. Mild occipital neuralgia.  Mental  Status -  Level of arousal and orientation to time, place, and person were intact. Language including expression, naming, repetition, comprehension, reading, and writing was assessed and found intact. Attention span and concentration were normal. Recent and remote memory were intact. Fund of Knowledge was assessed and was intact.  Cranial Nerves II - XII - II - Visual field intact OU. III, IV, VI - Extraocular movements intact. V - Facial sensation intact bilaterally. VII - Facial movement intact bilaterally. VIII - Hearing & vestibular intact bilaterally. X - Palate elevates symmetrically. XI - Chin turning & shoulder shrug intact bilaterally. XII - Tongue protrusion intact.  Motor Strength - The patient's strength was normal in all extremities and pronator drift was absent.  Bulk was normal and fasciculations were absent.   Motor Tone - Muscle tone was assessed at the neck and appendages and was normal.  Reflexes - The patient's reflexes were normal in all extremities and she had no pathological reflexes.  Sensory - Light touch, temperature/pinprick, vibration and proprioception, and Romberg testing were assessed and were normal.    Coordination - The patient had normal movements in the hands and feet with no ataxia or dysmetria.  Tremor was absent.  Gait and Station - The patient's transfers, posture, gait, station, and turns were observed as normal.  Data reviewed: I personally reviewed the images and agree with the radiology interpretations.  Ct Head (brain) Wo Contrast  03/13/2014  Stable head CT. No acute intracranial findings.  MRI / MRA Brain Without Contrast  03/14/2014  1. No acute intracranial abnormality.  2. Mild chronic small vessel ischemic disease.  3. Unremarkable head MRA.  Mr Cervical Spine Wo Contrast  03/14/2014  1. Multilevel cervical spondylosis with posterior osteophytes, uncinate spurring and facet hypertrophy superimposed on a congenitally  small canal.  2. The CSF surrounding the cord is effaced at C5-6. There is no cord flattening or abnormal signal.  3. Osteophytes contribute to at least moderate foraminal narrowing at multiple levels as detailed above.  4. No definite acute findings.  EEG - normal.  Lab Review Component  Latest Ref Rng 03/13/2014  Cholesterol  0 - 200 mg/dL 187  Triglycerides  <150 mg/dL 59  HDL  >39 mg/dL 57  Total CHOL/HDL Ratio   3.3  VLDL  0 - 40 mg/dL 12  LDL (calc)  0 - 99 mg/dL 118 (H)  Hemoglobin A1C  <5.7 % 5.8 (H)  Mean Plasma Glucose  <117 mg/dL 120 (H)    Assessment: As you may recall, she is a 79 y.o. African American female with PMH of HTN, HLD, GERD, asthma, depression present a confusion episode. She was driving but started to have confusion and not knowing the way she was going. Also had HA before the episode, along with left arm and leg painful sensation. Confusion resolved in ER. MRI and MRA negative for stroke or vessel stenosis. EEG normal. HA resolved after tylenol. Episode most likely complicated migraine but due to stroke risk factors, TIA can not be completely ruled out. Her LDL 118,  will continue ASA and zocor. During the interval time, she still complains of daily headache, likely triggered by cervical pain. Will prescribe nortriptyline for headache prevention.   Plan:  - continue ASA and zocor for stroke prevention - Prescribed nortriptyline for headache prevention - Continue tylenol for HA abortive treatment. - Check blood pressure at home - Follow up with your primary care physician for stroke risk factor modification. Recommend maintain blood pressure goal <130/80, diabetes with hemoglobin A1c goal below 6.5% and lipids with LDL cholesterol goal below 70 mg/dL.  - RTC in 3 months.  No orders of the defined types were placed in this encounter.    Meds ordered this encounter  Medications  . nortriptyline (PAMELOR)  10 MG capsule    Sig: Take 1 capsule (10 mg total) by mouth at bedtime.    Dispense:  7 capsule    Refill:  0  . nortriptyline (PAMELOR) 25 MG capsule    Sig: Take 1 capsule (25 mg total) by mouth at bedtime.    Dispense:  30 capsule    Refill:  3  . simvastatin (ZOCOR) 20 MG tablet    Sig: Take 1 tablet (20 mg total) by mouth daily at 6 PM.    Dispense:  90 tablet    Refill:  3    Patient Instructions  - continue ASA for stroke prevention - reorder zocor for stroke prevention and high cholesterol - will prescribe nortriptyline for headache prevention to see if it is effective for you. - check BP at home - Follow up with your primary care physician for stroke risk factor modification. Recommend maintain blood pressure goal <130/80, diabetes with hemoglobin A1c goal below 6.5% and lipids with LDL cholesterol goal below 70 mg/dL.  - read the education journal of migraine and do accordingly to minimize the headache frequency. - follow up in 3 months.    Rosalin Hawking, MD PhD [facility] [address] [place], [place] [address] [phone]

## 2014-08-11 ENCOUNTER — Ambulatory Visit: Payer: Commercial Managed Care - HMO | Admitting: Neurology

## 2014-08-27 ENCOUNTER — Encounter (INDEPENDENT_AMBULATORY_CARE_PROVIDER_SITE_OTHER): Payer: Self-pay

## 2014-08-27 ENCOUNTER — Ambulatory Visit (INDEPENDENT_AMBULATORY_CARE_PROVIDER_SITE_OTHER): Payer: Commercial Managed Care - HMO | Admitting: Pulmonary Disease

## 2014-08-27 ENCOUNTER — Encounter: Payer: Self-pay | Admitting: Pulmonary Disease

## 2014-08-27 VITALS — BP 120/62 | HR 67 | Temp 97.6°F | Ht 60.0 in | Wt 142.4 lb

## 2014-08-27 DIAGNOSIS — J45909 Unspecified asthma, uncomplicated: Secondary | ICD-10-CM

## 2014-08-27 NOTE — Assessment & Plan Note (Signed)
The patient feels that she has done very well with dulera, but is wondering if this is causing mouth sores. She is using a spacer and rinsing well, but continues to have intermittent issues. However, she is also describing severe reflux at night with regurgitation into her mouth of acid, and perhaps this is the culprit for her mouth issues. I have asked her to discuss this ASAP with her primary care physician. She feels the dulera has been successful for her, and would like to stay on this.

## 2014-08-27 NOTE — Progress Notes (Signed)
   Subjective:    Patient ID: [name], female    DOB: Sep 01, 1935, 79 y.o.   MRN: [id number]  HPI The patient comes in today for follow-up of her known asthma. Overall, she has been stable since her last visit on dulera with a spacer. She has not had an acute exacerbation or required increased rescue inhaler use. She is concerned about dulera possibly causing mouth sores, but she is also having significant reflux at night with regurgitation. She does use a spacer with her inhaler, but does not keep it as clean as she should. I have asked her to work on rinsing this out with soapy water once a week.   Review of Systems  Constitutional: Positive for chills. Negative for fever and unexpected weight change.  HENT: Positive for congestion and postnasal drip. Negative for dental problem, ear pain, nosebleeds, rhinorrhea, sinus pressure, sneezing, sore throat and trouble swallowing.   Eyes: Negative for redness and itching.  Respiratory: Positive for cough, chest tightness, shortness of breath and wheezing.   Cardiovascular: Negative for palpitations and leg swelling.  Gastrointestinal: Negative for nausea and vomiting.  Genitourinary: Negative for dysuria.  Musculoskeletal: Negative for joint swelling.  Skin: Negative for rash.  Neurological: Negative for headaches.  Hematological: Does not bruise/bleed easily.  Psychiatric/Behavioral: Negative for dysphoric mood. The patient is not nervous/anxious.        Objective:   Physical Exam Well-developed female in no acute distress Nose without purulence or discharge noted Oropharynx without thrush Neck without lymphadenopathy or thyromegaly Chest totally clear to auscultation, no wheezing Cardiac exam with regular rate and rhythm Lower extremities without edema, no cyanosis Alert and oriented, moves all 4 extremities.       Assessment & Plan:

## 2014-08-27 NOTE — Patient Instructions (Signed)
Stay on dulera with your spacer, and keep spacer rinsed out. Your mouth sores may be caused by your reflux, and not the dulera.  You need to speak with your primary doctor about your severe episodes of reflux at night with regurgitation followup with me again in 28mos.

## 2014-10-16 ENCOUNTER — Emergency Department (HOSPITAL_COMMUNITY)
Admission: EM | Admit: 2014-10-16 | Discharge: 2014-10-16 | Disposition: A | Discharge status: Home / Self Care | Payer: Commercial Managed Care - HMO | Attending: Emergency Medicine | Admitting: Emergency Medicine

## 2014-10-16 ENCOUNTER — Emergency Department (HOSPITAL_COMMUNITY): Payer: Commercial Managed Care - HMO

## 2014-10-16 ENCOUNTER — Encounter (HOSPITAL_COMMUNITY): Payer: Self-pay

## 2014-10-16 DIAGNOSIS — R51 Headache: Secondary | ICD-10-CM | POA: Diagnosis not present

## 2014-10-16 DIAGNOSIS — I10 Essential (primary) hypertension: Secondary | ICD-10-CM | POA: Diagnosis not present

## 2014-10-16 DIAGNOSIS — Z87891 Personal history of nicotine dependence: Secondary | ICD-10-CM | POA: Diagnosis not present

## 2014-10-16 DIAGNOSIS — R011 Cardiac murmur, unspecified: Secondary | ICD-10-CM | POA: Insufficient documentation

## 2014-10-16 DIAGNOSIS — M5412 Radiculopathy, cervical region: Secondary | ICD-10-CM

## 2014-10-16 DIAGNOSIS — J45909 Unspecified asthma, uncomplicated: Secondary | ICD-10-CM | POA: Insufficient documentation

## 2014-10-16 DIAGNOSIS — M1990 Unspecified osteoarthritis, unspecified site: Secondary | ICD-10-CM | POA: Insufficient documentation

## 2014-10-16 DIAGNOSIS — E785 Hyperlipidemia, unspecified: Secondary | ICD-10-CM | POA: Diagnosis not present

## 2014-10-16 DIAGNOSIS — Z8659 Personal history of other mental and behavioral disorders: Secondary | ICD-10-CM | POA: Diagnosis not present

## 2014-10-16 DIAGNOSIS — K219 Gastro-esophageal reflux disease without esophagitis: Secondary | ICD-10-CM | POA: Diagnosis not present

## 2014-10-16 DIAGNOSIS — Z8673 Personal history of transient ischemic attack (TIA), and cerebral infarction without residual deficits: Secondary | ICD-10-CM | POA: Insufficient documentation

## 2014-10-16 DIAGNOSIS — Z7982 Long term (current) use of aspirin: Secondary | ICD-10-CM | POA: Diagnosis not present

## 2014-10-16 DIAGNOSIS — Z7901 Long term (current) use of anticoagulants: Secondary | ICD-10-CM | POA: Insufficient documentation

## 2014-10-16 DIAGNOSIS — H269 Unspecified cataract: Secondary | ICD-10-CM | POA: Diagnosis not present

## 2014-10-16 DIAGNOSIS — Z79899 Other long term (current) drug therapy: Secondary | ICD-10-CM | POA: Diagnosis not present

## 2014-10-16 DIAGNOSIS — Z880 Allergy status to penicillin: Secondary | ICD-10-CM | POA: Diagnosis not present

## 2014-10-16 DIAGNOSIS — R200 Anesthesia of skin: Secondary | ICD-10-CM | POA: Diagnosis present

## 2014-10-16 DIAGNOSIS — Z86010 Personal history of colonic polyps: Secondary | ICD-10-CM | POA: Insufficient documentation

## 2014-10-16 HISTORY — DX: Transient cerebral ischemic attack, unspecified: G45.9

## 2014-10-16 LAB — RAPID URINE DRUG SCREEN, HOSP PERFORMED
AMPHETAMINES: NOT DETECTED
Barbiturates: NOT DETECTED
Benzodiazepines: NOT DETECTED
COCAINE: NOT DETECTED
OPIATES: NOT DETECTED
TETRAHYDROCANNABINOL: NOT DETECTED

## 2014-10-16 LAB — URINALYSIS, ROUTINE W REFLEX MICROSCOPIC
BILIRUBIN URINE: NEGATIVE
Glucose, UA: NEGATIVE mg/dL
Hgb urine dipstick: NEGATIVE
Ketones, ur: NEGATIVE mg/dL
Leukocytes, UA: NEGATIVE
NITRITE: NEGATIVE
Protein, ur: NEGATIVE mg/dL
SPECIFIC GRAVITY, URINE: 1.006 (ref 1.005–1.030)
Urobilinogen, UA: 0.2 mg/dL (ref 0.0–1.0)
pH: 7 (ref 5.0–8.0)

## 2014-10-16 LAB — CBG MONITORING, ED
GLUCOSE-CAPILLARY: 71 mg/dL (ref 70–99)
Glucose-Capillary: 107 mg/dL — ABNORMAL HIGH (ref 70–99)

## 2014-10-16 LAB — BASIC METABOLIC PANEL
Anion gap: 8 (ref 5–15)
BUN: 14 mg/dL (ref 6–23)
CO2: 26 mmol/L (ref 19–32)
Calcium: 9.4 mg/dL (ref 8.4–10.5)
Chloride: 105 mmol/L (ref 96–112)
Creatinine, Ser: 1.08 mg/dL (ref 0.50–1.10)
GFR calc Af Amer: 56 mL/min — ABNORMAL LOW (ref >90)
GFR, EST NON AFRICAN AMERICAN: 48 mL/min — AB (ref >90)
Glucose, Bld: 86 mg/dL (ref 70–99)
POTASSIUM: 3.7 mmol/L (ref 3.5–5.1)
Sodium: 139 mmol/L (ref 135–145)

## 2014-10-16 LAB — CBC
HCT: 37.3 % (ref 36.0–46.0)
Hemoglobin: 12.5 g/dL (ref 12.0–15.0)
MCH: 29.7 pg (ref 26.0–34.0)
MCHC: 33.5 g/dL (ref 30.0–36.0)
MCV: 88.6 fL (ref 78.0–100.0)
Platelets: 290 10*3/uL (ref 150–400)
RBC: 4.21 MIL/uL (ref 3.87–5.11)
RDW: 13.3 % (ref 11.5–15.5)
WBC: 3.2 10*3/uL — ABNORMAL LOW (ref 4.0–10.5)

## 2014-10-16 LAB — I-STAT TROPONIN, ED: Troponin i, poc: 0 ng/mL (ref 0.00–0.08)

## 2014-10-16 LAB — I-STAT CHEM 8, ED
BUN: 16 mg/dL (ref 6–23)
CALCIUM ION: 1.23 mmol/L (ref 1.13–1.30)
CHLORIDE: 103 mmol/L (ref 96–112)
Creatinine, Ser: 1.2 mg/dL — ABNORMAL HIGH (ref 0.50–1.10)
GLUCOSE: 87 mg/dL (ref 70–99)
HCT: 38 % (ref 36.0–46.0)
Hemoglobin: 12.9 g/dL (ref 12.0–15.0)
Potassium: 3.8 mmol/L (ref 3.5–5.1)
Sodium: 142 mmol/L (ref 135–145)
TCO2: 24 mmol/L (ref 0–100)

## 2014-10-16 LAB — PROTIME-INR
INR: 1.07 (ref 0.00–1.49)
Prothrombin Time: 14.1 seconds (ref 11.6–15.2)

## 2014-10-16 LAB — TROPONIN I: Troponin I: <0.03 ng/mL (ref <0.031)

## 2014-10-16 LAB — ETHANOL

## 2014-10-16 LAB — APTT: aPTT: 28 seconds (ref 24–37)

## 2014-10-16 MED ORDER — PREDNISONE 20 MG PO TABS: 20.0000 mg | ORAL_TABLET | Freq: Two times a day (BID) | ORAL | Status: DC

## 2014-10-16 NOTE — ED Notes (Signed)
Lab at the bedside 

## 2014-10-16 NOTE — Discharge Instructions (Signed)
Use heat on the sore area 3 or 4 times a day. Follow-up with your primary care doctor for checkup, next week.    Cervical Radiculopathy Cervical radiculopathy happens when a nerve in the neck is pinched or bruised by a slipped (herniated) disk or by arthritic changes in the bones of the cervical spine. This can occur due to an injury or as part of the normal aging process. Pressure on the cervical nerves can cause pain or numbness that runs from your neck all the way down into your arm and fingers. CAUSES  There are many possible causes, including:  Injury.  Muscle tightness in the neck from overuse.  Swollen, painful joints (arthritis).  Breakdown or degeneration in the bones and joints of the spine (spondylosis) due to aging.  Bone spurs that may develop near the cervical nerves. SYMPTOMS  Symptoms include pain, weakness, or numbness in the affected arm and hand. Pain can be severe or irritating. Symptoms may be worse when extending or turning the neck. DIAGNOSIS  Your caregiver will ask about your symptoms and do a physical exam. He or she may test your strength and reflexes. X-rays, CT scans, and MRI scans may be needed in cases of injury or if the symptoms do not go away after a period of time. Electromyography (EMG) or nerve conduction testing may be done to study how your nerves and muscles are working. TREATMENT  Your caregiver may recommend certain exercises to help relieve your symptoms. Cervical radiculopathy can, and often does, get better with time and treatment. If your problems continue, treatment options may include:  Wearing a soft collar for short periods of time.  Physical therapy to strengthen the neck muscles.  Medicines, such as nonsteroidal anti-inflammatory drugs (NSAIDs), oral corticosteroids, or spinal injections.  Surgery. Different types of surgery may be done depending on the cause of your problems. HOME CARE INSTRUCTIONS   Put ice on the affected  area.  Put ice in a plastic bag.  Place a towel between your skin and the bag.  Leave the ice on for 15-20 minutes, 03-04 times a day or as directed by your caregiver.  If ice does not help, you can try using heat. Take a warm shower or bath, or use a hot water bottle as directed by your caregiver.  You may try a gentle neck and shoulder massage.  Use a flat pillow when you sleep.  Only take over-the-counter or prescription medicines for pain, discomfort, or fever as directed by your caregiver.  If physical therapy was prescribed, follow your caregiver's directions.  If a soft collar was prescribed, use it as directed. SEEK IMMEDIATE MEDICAL CARE IF:   Your pain gets much worse and cannot be controlled with medicines.  You have weakness or numbness in your hand, arm, face, or leg.  You have a high fever or a stiff, rigid neck.  You lose bowel or bladder control (incontinence).  You have trouble with walking, balance, or speaking. MAKE SURE YOU:   Understand these instructions.  Will watch your condition.  Will get help right away if you are not doing well or get worse. Document Released: 02/28/2001 Document Revised: 08/28/2011 Document Reviewed: 01/17/2011 [facility] Patient Information 2015 [place], [place]. This information is not intended to replace advice given to you by your health care provider. Make sure you discuss any questions you have with your health care provider.

## 2014-10-16 NOTE — ED Notes (Signed)
CBG = 71  RN Hayden informed of result.

## 2014-10-16 NOTE — ED Notes (Signed)
PT placed in gown and in bed. Pt monitored by pulse ox, bp cuff, and 12-lead. 

## 2014-10-16 NOTE — ED Notes (Signed)
Pt brought in by EMS for left sided numbness in left arm, hand and leg.  Pt reports she felt fine when going to bed last night around 2100 but woke up this morning at 0500 with the weakness.  Pt has hx of TIA's with the last one being in September.  Pt reports she has slight weakness on the left from previous TIA.  Pt reports nausea but denies any v/d, chest pain, diaphoresis, slurred speech or dizziness.

## 2014-10-16 NOTE — ED Provider Notes (Signed)
CSN: [id number]     Arrival date & time 10/16/14  4259 History   First MD Initiated Contact with Patient 10/16/14 [phone]     Chief Complaint  Patient presents with  . Numbness     (Consider location/radiation/quality/duration/timing/severity/associated sxs/prior Treatment) HPI   [name] is a 79 y.o. female who presents for evaluation of pain and tingling in left shoulder and left arm. She also has tingling sensation in her left leg. The symptoms were present upon awakening this morning. They were not there when she went to bed last night. She is concerned that she might have another TIA. She has a mild headache this morning, but it stopped after taking Tylenol. She has had prior TIA. She has history of complicated migraines. She did not eat this morning. She was able to make arrangements for her handicapped son, prior to calling EMS to bring her here. She has frequent headaches. She denies recent fever, chills, nausea, vomiting, cough, shortness of breath or chest pain. She is taking her usual medications. There are no other known modifying factors.   Past Medical History  Diagnosis Date  . Hypertension   . H/O seasonal allergies   . Asthma   . GERD (gastroesophageal reflux disease)   . Hx of colonic polyp   . Hemorrhoids, internal   . Hyperlipemia   . Asthma   . Allergy   . Arthritis   . Cataract   . Depression   . Heart murmur   . Diverticulosis 10-2011    Colonoscopy  . Hemorrhoids 10-2011    Colonoscopy  . 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    EGD  . TIA (transient ischemic attack)    Past Surgical History  Procedure Laterality Date  . Cesarean section  1962  . Laparoscopic cholecystectomy  1995   Family History  Problem Relation Age of Onset  . Colon cancer Sister 15  . Stomach cancer Neg Hx   . Breast cancer      Niece  . Irritable bowel syndrome Sister    History  Substance Use Topics  . Smoking status: Former Smoker -- 0.50 packs/day    Types:  Cigarettes    Quit date: 09/28/1959  . Smokeless tobacco: Never Used  . Alcohol Use: No   OB History    No data available     Review of Systems  All other systems reviewed and are negative.     Allergies  Cepacol; Lactose intolerance (gi); Other; and Penicillins  Home Medications   Prior to Admission medications   Medication Sig Start Date End Date Taking? Authorizing Provider  Albuterol Sulfate (PROAIR RESPICLICK) 756 (90 BASE) MCG/ACT AEPB Inhale 2 puffs into the lungs every 6 (six) hours as needed. 02/25/14  Yes Kathee Delton, MD  Alpha-D-Galactosidase (BEANO PO) Take 1 tablet by mouth as needed (gas and bloating).    Yes Historical Provider, MD  clopidogrel (PLAVIX) 75 MG tablet Take 75 mg by mouth daily. 09/24/14  Yes Historical Provider, MD  cycloSPORINE (RESTASIS) 0.05 % ophthalmic emulsion Place 1 drop into both eyes at bedtime as needed (for dry eyes or irritation).   Yes Historical Provider, MD  losartan-hydrochlorothiazide (HYZAAR) 50-12.5 MG per tablet Take 1 tablet by mouth daily.  09/01/11  Yes Historical Provider, MD  mometasone-formoterol (DULERA) 100-5 MCG/ACT AERO Inhale 2 puffs into the lungs 2 (two) times daily. Patient taking differently: Inhale 2 puffs into the lungs daily.  02/04/14  Yes Kathee Delton, MD  Multiple Vitamin (  MULTIVITAMIN) tablet Take 1 tablet by mouth daily.   Yes Historical Provider, MD  pantoprazole (PROTONIX) 40 MG tablet Take 40 mg by mouth daily.   Yes Historical Provider, MD  polyethylene glycol (MIRALAX / GLYCOLAX) packet Take 17 g by mouth 2 (two) times daily. Patient taking differently: Take 17 g by mouth daily as needed for mild constipation.  03/15/14  Yes Bonnielee Haff, MD  Probiotic Product (PROBIOTIC PO) Take 1 capsule by mouth 3 (three) times daily after meals.    Yes Historical Provider, MD  ranitidine (ZANTAC) 75 MG tablet Take 75 mg by mouth daily as needed for heartburn.   Yes Historical Provider, MD  simvastatin (ZOCOR) 20 MG  tablet Take 1 tablet (20 mg total) by mouth daily at 6 PM. Patient taking differently: Take 20 mg by mouth as needed.  07/15/14  Yes Rosalin Hawking, MD  Vitamin D, Ergocalciferol, (DRISDOL) 50000 UNITS CAPS capsule Take 50,000 Units by mouth once a week. Take on Sundays 09/30/14  Yes Historical Provider, MD  Ascorbic Acid (VITAMIN C) 1000 MG tablet Take 1,000 mg by mouth daily as needed (Cold).     Historical Provider, MD  aspirin EC 325 MG tablet Take 1 tablet (325 mg total) by mouth daily. Patient not taking: Reported on 10/16/2014 03/15/14   Bonnielee Haff, MD  nortriptyline (PAMELOR) 25 MG capsule Take 1 capsule (25 mg total) by mouth at bedtime. Patient not taking: Reported on 10/16/2014 07/22/14   Rosalin Hawking, MD   BP 124/69 mmHg  Pulse 62  Temp(Src) 97.7 F (36.5 C) (Oral)  Resp 18  Ht 5' (1.524 m)  Wt 145 lb (65.772 kg)  BMI 28.32 kg/m2  SpO2 97% Physical Exam  Constitutional: She is oriented to person, place, and time. She appears well-developed.  Elderly, frail  HENT:  Head: Normocephalic and atraumatic.  Right Ear: External ear normal.  Left Ear: External ear normal.  Eyes: Conjunctivae and EOM are normal. Pupils are equal, round, and reactive to light.  Neck: Normal range of motion and phonation normal. Neck supple.  Cardiovascular: Normal rate, regular rhythm and normal heart sounds.   Pulmonary/Chest: Effort normal and breath sounds normal. She exhibits no bony tenderness.  Abdominal: Soft. There is no tenderness.  Musculoskeletal: Normal range of motion.  Nontender left shoulder. Mild left trapezius tenderness to palpation. Normal range of motion, neck.  Neurological: She is alert and oriented to person, place, and time. No sensory deficit. She exhibits normal muscle tone. Coordination normal.  No dysarthria and aphasia or nystagmus. Mild decreased light touch sensation to the left face, left forearm, and left lower leg.  Skin: Skin is warm, dry and intact.  Psychiatric: She has  a normal mood and affect. Her behavior is normal. Judgment and thought content normal.  Nursing note and vitals reviewed.   ED Course  Procedures (including critical care time)  08:40- Thrombolysis consideration- neurologic symptoms present upon awakening, very low NIH score, alternate explanation present; all are reasons for no thrombolysis.  Medications - No data to display  Patient Vitals for the past 24 hrs:  BP Temp Temp src Pulse Resp SpO2 Height Weight  10/16/14 1130 124/69 mmHg - - - 18 - - -  10/16/14 1100 128/72 mmHg - - - 17 - - -  10/16/14 0930 148/67 mmHg - - - 15 - - -  10/16/14 0900 136/65 mmHg - - - 14 - - -  10/16/14 0835 149/73 mmHg 97.7 F (36.5 C) Oral 62 18  97 % 5' (1.524 m) 145 lb (65.772 kg)  10/16/14 0833 - - - - - 96 % - -   ___________________________________________________________________________ Cervical MR Abstract- 11/15  IMPRESSION: 1. 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 2. The CSF surrounding the cord is effaced at C5-6. There is no cord flattening or abnormal signal. 3. Osteophytes contribute to at least moderate foraminal narrowing at multiple levels as detailed above. 4. No definite acute findings.   Electronically Signed  By: Camie Patience M.D.  On: 03/14/2014 10:50 ____________________________________________________________________________  1:15 PM Reevaluation with update and discussion. After initial assessment and treatment, an updated evaluation reveals he feels better now. There are no further complaints. There is no change in her clinical status. Findings discussed with the patient, all questions answered. [place] Review Labs Reviewed  CBC - Abnormal; Notable for the following:    WBC 3.2 (*)    All other components within normal limits  BASIC METABOLIC PANEL - Abnormal; Notable for the following:     GFR calc non Af Amer 48 (*)    GFR calc Af Amer 56 (*)    All other components within normal limits  I-STAT CHEM 8, ED - Abnormal; Notable for the following:    Creatinine, Ser 1.20 (*)    All other components within normal limits  CBG MONITORING, ED - Abnormal; Notable for the following:    Glucose-Capillary 107 (*)    All other components within normal limits  ETHANOL  PROTIME-INR  APTT  URINE RAPID DRUG SCREEN (HOSP PERFORMED)  URINALYSIS, ROUTINE W REFLEX MICROSCOPIC  TROPONIN I  CBG MONITORING, ED  I-STAT TROPOININ, ED    Imaging Review Ct Head Wo Contrast  10/16/2014   CLINICAL DATA:  Left arm and leg numbness.  EXAM: CT HEAD WITHOUT CONTRAST  TECHNIQUE: Contiguous axial images were obtained from the base of the skull through the vertex without intravenous contrast.  COMPARISON:  MRI 03/14/2014.  FINDINGS: No acute intracranial abnormality. Specifically, no hemorrhage, hydrocephalus, mass lesion, acute infarction, or significant intracranial injury. No acute calvarial abnormality. Visualized paranasal sinuses and mastoids clear. Orbital soft tissues unremarkable.  IMPRESSION: No acute intracranial abnormality.   Electronically Signed   By: Rolm Baptise M.D.   On: 10/16/2014 10:20     EKG Interpretation   Date/Time:  Friday October 16 2014 08:34:09 EDT Ventricular Rate:  58 PR Interval:  179 QRS Duration: 81 QT Interval:  417 QTC Calculation: 409 R Axis:   58 Text Interpretation:  Sinus rhythm Borderline low voltage, extremity leads  Baseline wander in lead(s) I III aVL aVF since last tracing no significant  change Confirmed by Eulis Foster  MD, Jester Klingberg ([id number]) on 10/16/2014 9:14:26 AM      MDM   Final diagnoses:  Cervical radiculopathy    Nonspecific paresthesias with known significant cervical spondylopathy. Doubt TIA, CVA, metabolic instability or serious bacterial infection.   Nursing Notes Reviewed/ Care Coordinated Applicable Imaging Reviewed Interpretation of  Laboratory Data incorporated into ED treatment  The patient appears reasonably screened and/or stabilized for discharge and I doubt any other medical condition or other [facility] requiring further screening, evaluation, or treatment in the ED at this time prior to discharge.  Plan: Home Medications- Prednisone; Home Treatments- heat; return here if the recommended treatment, does not improve the symptoms; Recommended follow up- PCP 1 week     Daleen Bo, MD 10/16/14 1330

## 2014-10-21 ENCOUNTER — Ambulatory Visit (INDEPENDENT_AMBULATORY_CARE_PROVIDER_SITE_OTHER): Payer: Commercial Managed Care - HMO | Admitting: Neurology

## 2014-10-21 ENCOUNTER — Encounter: Payer: Self-pay | Admitting: Neurology

## 2014-10-21 VITALS — BP 118/74 | HR 73

## 2014-10-21 DIAGNOSIS — E785 Hyperlipidemia, unspecified: Secondary | ICD-10-CM

## 2014-10-21 DIAGNOSIS — M542 Cervicalgia: Secondary | ICD-10-CM

## 2014-10-21 DIAGNOSIS — M5440 Lumbago with sciatica, unspecified side: Secondary | ICD-10-CM

## 2014-10-21 DIAGNOSIS — G459 Transient cerebral ischemic attack, unspecified: Secondary | ICD-10-CM

## 2014-10-21 DIAGNOSIS — I10 Essential (primary) hypertension: Secondary | ICD-10-CM | POA: Diagnosis not present

## 2014-10-21 DIAGNOSIS — M545 Low back pain, unspecified: Secondary | ICD-10-CM | POA: Insufficient documentation

## 2014-10-21 NOTE — Patient Instructions (Addendum)
-   continue ASA and zocor for stroke prevention - recently you experience neck pain and back pain radiating to left shoulder and arm and bilateral legs, you need to avoid certain maneuver to avoid further pain - will refer to PT to help your pain and management your movement and activity - will do nerve conduction study to evaluate never impinchment. - continue nortriptyline and tylenol for headache.  - check BP at home - Follow up with your primary care physician for stroke risk factor modification. Recommend maintain blood pressure goal <130/80, diabetes with hemoglobin A1c goal below 6.5% and lipids with LDL cholesterol goal below 70 mg/dL.  - follow up in 2-3 months.
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
## 2014-11-03 ENCOUNTER — Encounter: Payer: Commercial Managed Care - HMO | Admitting: Radiology

## 2014-11-03 ENCOUNTER — Encounter: Payer: Commercial Managed Care - HMO | Admitting: Neurology

## 2014-11-10 ENCOUNTER — Ambulatory Visit (INDEPENDENT_AMBULATORY_CARE_PROVIDER_SITE_OTHER): Payer: Commercial Managed Care - HMO | Admitting: Neurology

## 2014-11-10 ENCOUNTER — Ambulatory Visit (INDEPENDENT_AMBULATORY_CARE_PROVIDER_SITE_OTHER): Payer: Self-pay | Admitting: Neurology

## 2014-11-10 DIAGNOSIS — Z0289 Encounter for other administrative examinations: Secondary | ICD-10-CM

## 2014-11-10 DIAGNOSIS — M5010 Cervical disc disorder with radiculopathy, unspecified cervical region: Secondary | ICD-10-CM

## 2014-11-10 DIAGNOSIS — M5440 Lumbago with sciatica, unspecified side: Secondary | ICD-10-CM

## 2014-11-10 DIAGNOSIS — M542 Cervicalgia: Secondary | ICD-10-CM

## 2014-11-11 NOTE — Progress Notes (Signed)
See procedure note.

## 2014-11-11 NOTE — Procedures (Signed)
[facility]    Provider:  Dr Jaynee Eagles Referring Provider: Leighton Ruff, MD Primary Care Physician:  Gerrit Heck, MD   History:  [name] is a 79 y.o. female here for evaluation of left-sided neck and left-sided low back pain with paresthesias.  Summary  Nerve conduction studies were performed on the left upper and bilateral lower extremities:  Left APB Median motor nerve showed normal conductions with normal F Wave latency Left ADM Ulnar motor nerve showed normal conductions with normal F Wave latency Bilateral Peroneal motor nerves showed normal conductions with normal F Wave latencies Bilateral Tibial motor nerves showed normal conductions with normal F Wave latencies Left  second-digit Median sensory nerve conduction was within normal limits Left  fifth-digit Ulnar sensory nerve conduction was within normal limits Bilateral Sural sensory nerve conductions were within normal limits Bilateral H Reflexes showed normal latencies  EMG Needle study was performed on selected left upper extremity muscles:   The Deltoid, Triceps, Pronator Teres, Opponens Pollicis, First Dorsal Interosseous muscles were within normal limits. Patient could not activate the Pronator Teres or First Dorsal Interosseous muscles due to pain. Patient declined further emg needle study.  Conclusion: This is a limited exam, EMG needle study was prematurely terminated at patient's request. This is an otherwise normal study. No electrophysiologic evidence for peripheral polyneuropathy, mononeuropathy or radiculopathy from limited data. Clinical correlation recommended.   Sarina Ill, MD  [facility] [address] [place], [place] [address]

## 2014-11-11 NOTE — Progress Notes (Signed)
  [facility]    Provider:  Dr Jaynee Eagles Referring Provider: Leighton Ruff, MD Primary Care Physician:  Gerrit Heck, MD   History:  [name] is a 79 y.o. female here for evaluation of left-sided neck and left-sided low back pain with paresthesias.  Summary  Nerve conduction studies were performed on the left upper and bilateral lower extremities:  Left APB Median motor nerve showed normal conductions with normal F Wave latency Left ADM Ulnar motor nerve showed normal conductions with normal F Wave latency Bilateral Peroneal motor nerves showed normal conductions with normal F Wave latencies Bilateral Tibial motor nerves showed normal conductions with normal F Wave latencies Left  second-digit Median sensory nerve conduction was within normal limits Left  fifth-digit Ulnar sensory nerve conduction was within normal limits Bilateral Sural sensory nerve conductions were within normal limits Bilateral H Reflexes showed normal latencies  EMG Needle study was performed on selected left upper extremity muscles:   The Deltoid, Triceps, Pronator Teres, Opponens Pollicis, First Dorsal Interosseous muscles were within normal limits. Patient could not activate the Pronator Teres or First Dorsal Interosseous muscles due to pain. Patient declined further emg needle study.  Conclusion: This is a limited exam, EMG needle study was prematurely terminated at patient's request. This is [name] otherwise normal study. No electrophysiologic evidence for peripheral polyneuropathy, mononeuropathy or radiculopathy from limited data. Clinical correlation recommended.   Sarina Ill, MD  [facility] [address] [place], [place] [address]  Phone [phone] Fax [phone]

## 2014-11-17 ENCOUNTER — Telehealth: Payer: Self-pay | Admitting: *Deleted

## 2014-11-17 NOTE — Telephone Encounter (Signed)
Spoke with patient and relayed Dr Phoebe Sharps exact note re: nerve conduction study that was completed in this office was normal. She states she was unable to even complete the nerve conduction study due to pain, and she stated "So how does he know nothing is wrong?" Informed her that the piece of the study that was completed showed normal results. She states that "something is wrong with my back because I still have pain." She states she was told by ED Dr in the past that "her pain is due to back issues". She states she may need to get a second opinion. She states she has done PT in past and would like to try it again. Informed her this RN would route her concerns, request to Dr Erlinda Hong.  She verbalized understanding, appreciation.

## 2014-11-17 NOTE — Telephone Encounter (Signed)
-----   Message from Lester Rhodhiss, RN sent at 11/17/2014  7:08 AM EDT -----   ----- Message -----    From: Rosalin Hawking, MD    Sent: 11/13/2014   4:42 PM      To: [facility] Triage Pool  Hello, couldn't let patient know data her nerve conduction study done in office the other day was normal? Continue current management. Thank you.  Rosalin Hawking, MD PhD Stroke Neurology 11/13/2014 4:42 PM

## 2014-11-18 NOTE — Telephone Encounter (Signed)
Discussed with patient over the phone regarding EMG results. Patient still has neck pain and back pain, stated that in the past physiotherapy helped a lot. However, I ordered PT referral on 10/21/2014, but patient stated that she never get call from PT.   Hi, Mary, could you please check on the status of PT referral? Thanks.  Rosalin Hawking, MD PhD Stroke Neurology 11/18/2014 6:37 PM

## 2014-11-19 NOTE — Telephone Encounter (Signed)
Per Neuro rehab, spoke with patient and requested she call rehab center to schedule her PT appointment. Gave her number; she correctly repeated number back. She stated she would "give them a call". She thanked this caller and verbalized understanding.

## 2014-12-11 ENCOUNTER — Ambulatory Visit
Payer: Commercial Managed Care - HMO | Attending: Neurology | Admitting: Rehabilitative and Restorative Service Providers"

## 2014-12-11 DIAGNOSIS — M6281 Muscle weakness (generalized): Secondary | ICD-10-CM | POA: Insufficient documentation

## 2014-12-11 DIAGNOSIS — R293 Abnormal posture: Secondary | ICD-10-CM | POA: Insufficient documentation

## 2014-12-11 DIAGNOSIS — M542 Cervicalgia: Secondary | ICD-10-CM | POA: Insufficient documentation

## 2014-12-11 NOTE — Therapy (Signed)
[facility] [address] [place], [place], [address] Phone: [phone]   Fax:  [phone]  Physical Therapy Evaluation  Patient Details  Name: KIERSTEN COSS MRN: [id number] Date of Birth: 1935/12/16 Referring Provider:  Rosalin Hawking, MD  Encounter Date: 12/11/2014      PT End of Session - 12/11/14 1110    Visit Number 1   Number of Visits 8   Date for PT Re-Evaluation 02/08/15   Authorization Type [employer] H[employer]/ [facility] provider   PT Start Time 1018   PT Stop Time 1105   PT Time Calculation (min) 47 min   Activity Tolerance Patient tolerated treatment well   Behavior During Therapy [facility] for tasks assessed/performed      Past Medical History  Diagnosis Date  . Hypertension   . H/O seasonal allergies   . Asthma   . GERD (gastroesophageal reflux disease)   . Hx of colonic polyp   . Hemorrhoids, internal   . Hyperlipemia   . Asthma   . Allergy   . Arthritis   . Cataract   . Depression   . Heart murmur   . Diverticulosis 10-2011    Colonoscopy  . Hemorrhoids 10-2011    Colonoscopy  . 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    EGD  . TIA (transient ischemic attack)     Past Surgical History  Procedure Laterality Date  . Cesarean section  1962  . Laparoscopic cholecystectomy  1995    There were no vitals filed for this visit.  Visit Diagnosis:  Neck pain  Abnormal posture  Generalized muscle weakness      Subjective Assessment - 12/11/14 1022    Subjective The patient reports a chronic nature of neck pain radiating into L arm and L leg.  She reports also having back pain that radiates down into her bottom.  She reports she has been dealing with these symptoms off and on for years noting certain activities can exacerbate symtpoms.  She reported to ED a few weeks ago due to radiating nature of symptoms and concerned about heart attack.     Pertinent History The patient reports she has had PT in the past and  felt significant improvement with exercises.   Patient Stated Goals Decrease pain in neck and back and "somehow help with numbness in legs" (describes intermittent numbness with walking).   Currently in Pain? Yes   Pain Score 1   patient took tylenol   Pain Location Neck   Pain Orientation Left   Pain Descriptors / Indicators Sharp;Shooting  with  numbness noted in fingers   Pain Type Chronic pain   Pain Radiating Towards L UE, when severe radiates into left hand/fingers   Pain Onset More than a month ago   Pain Frequency Constant   Aggravating Factors  picking up heavy items (aggravated after picking up niece at family gathering)   Pain Relieving Factors tylenol   Multiple Pain Sites Yes   Pain Score 2   Pain Location Back   Pain Orientation Lower   Pain Descriptors / Indicators Discomfort   Pain Type Chronic pain   Pain Radiating Towards can radiate down into bottom on both sides with intermittent episodes of numbness   Pain Onset More than a month ago   Pain Frequency Constant   Aggravating Factors  walking longer distances aggravates leg numbness, moving in general   Pain Relieving Factors nothing helps  [facility] PT Assessment - 12/11/14 1033    Assessment   Medical Diagnosis neck and back pain   Prior Therapy prior therapy OP for both neck and back   Balance Screen   Has the patient fallen in the past 6 months No   Has the patient had a decrease in activity level because of a fear of falling?  No   Is the patient reluctant to leave their home because of a fear of falling?  No   Home Environment   Living Environment Private residence   Living Arrangements Children  caregiver for son, patient moves on his own   Home Access Level entry   Home Layout Two level   Alternate Level Stairs-Number of Steps --  bedroom downstairs, but negotiates steps occasionally   Alternate Level Stairs-Rails Right   Additional Comments stairs challenging to negotiate   Prior  Function   Level of Independence Independent  with all activities and caregiver for son   ROM / Strength   AROM / PROM / Strength AROM;Strength   AROM   Overall AROM  Deficits   AROM Assessment Site Cervical;Shoulder;Lumbar   Right/Left Shoulder Right;Left   Right Shoulder Flexion --  WFLs   Right Shoulder ABduction --  WFLs   Right Shoulder Internal Rotation --  WFLs   Right Shoulder External Rotation --  WFLs   Left Shoulder Flexion --  WFLs   Left Shoulder ABduction --  WFLs   Left Shoulder Internal Rotation --  WFLs   Left Shoulder External Rotation --  WFLs   Cervical Flexion 24 deg   Cervical Extension 12 deg   Cervical - Right Side Bend 30 deg with pain L side   Cervical - Left Side Bend --  28 deg   Cervical - Right Rotation 58 deg   Cervical - Left Rotation 52 deg   Lumbar Flexion WFLs   Lumbar Extension pain with minimal extension   Strength   Strength Assessment Site Shoulder;Elbow;Wrist;Hand;Hip;Knee;Ankle   Right/Left Shoulder Right;Left   Right Shoulder Flexion 4/5   Right Shoulder ABduction 4/5   Right Shoulder Internal Rotation 4/5   Right Shoulder External Rotation 4/5   Left Shoulder Flexion 4/5   Left Shoulder ABduction 4/5   Left Shoulder Internal Rotation 4/5   Left Shoulder External Rotation 4/5   Right/Left Elbow Right;Left   Right Elbow Flexion 5/5   Right Elbow Extension 5/5   Left Elbow Flexion 5/5   Left Elbow Extension 5/5   Right/Left Wrist Right;Left   Right Wrist Flexion 4/5   Right Wrist Extension 4/5   Left Wrist Flexion 3/5   Left Wrist Extension 3+/5   Right/Left hand Right;Left   Right Hand Grip (lbs) 35 lbs  *R dominant   Left Hand Grip (lbs) 15 lbs   Right/Left Hip Right;Left   Right Hip Flexion 4/5   Left Hip Flexion 3+/5   Right/Left Knee Right;Left   Right Knee Flexion 5/5   Right Knee Extension 5/5   Left Knee Flexion 4/5   Left Knee Extension 4/5   Right/Left Ankle Right;Left   Right Ankle Dorsiflexion 4-/5    Left Ankle Dorsiflexion 3+/5   Palpation   Palpation comment Patient with significant tightness in L upper trapezius, L pectoralis and neural tightness noted in L UE with stretching      THERAPEUTIC EXERCISE: Provided HEP Supine neural gliding  Supine chest/pec stretch L UE       PT Education - 12/11/14 1109  Education provided Yes   Education Details HEP: shoulder rolls, supine neck retraction   Person(s) Educated Patient   Methods Explanation;Demonstration;Handout   Comprehension Returned demonstration;Verbalized understanding          PT Short Term Goals - 12/11/14 1110    PT SHORT TERM GOAL #1   Title The patient will be indep with HEP for neck and lumbar flexibility.   Baseline Target date 01/09/2015   Time 4   Period Weeks   PT SHORT TERM GOAL #2   Title The patient will return demo sleeping positions for improved comfort at night.   Baseline Target date 01/09/2015   Time 4   Period Weeks   PT SHORT TERM GOAL #3   Title The patient will report no pain with R lateral neck flexion (in L upper trap).   Baseline Target date 01/09/2015   Time 4   Period Weeks   PT SHORT TERM GOAL #4   Title The patient will increase L UE strength to 4/5 in shoulder flexion/abduction, and wrist flexion/extension.   Baseline Target date 01/09/2015   Time 4   Period Weeks           PT Long Term Goals - 12/11/14 1112    PT LONG TERM GOAL #1   Title The patient will be indep with HEP for postural strengthening.   Baseline Target date 02/08/2015   Time 8   Period Weeks   PT LONG TERM GOAL #2   Title The patient will improve on neck disability index from 44% to < or equal to 30%.   Baseline Target date 02/08/2015   Time 8   Period Weeks   PT LONG TERM GOAL #3   Title The patient will improve L grip from 15lbs to 25 lbs.   Baseline Target date 02/08/2015   Time 8   Period Weeks   PT LONG TERM GOAL #4   Title The patient will improve L UE strength to 4/5 wrist  flexion/extension.   Baseline Target date 02/08/2015   Time 8   Period Weeks   PT LONG TERM GOAL #5   Title The patient will improve bilateral ankle dorsiflexion to 4/5.   Baseline Target date 02/08/2015   Time 8   Period Weeks               Plan - 12/11/14 1638    Clinical Impression Statement The patient is a 79 yo female with chronic pain in neck and back presenting to PT with radiating L UE symptoms with L grip significantly weaker than R grip.  Patient with significant muscle tightness L UE.  PT to address deficits to optimize current functional status.   Pt will benefit from skilled therapeutic intervention in order to improve on the following deficits Decreased strength;Decreased range of motion;Impaired flexibility;Pain;Improper body mechanics;Postural dysfunction;Decreased mobility   Rehab Potential Good   PT Frequency 2x / week   PT Duration 8 weeks  however patient scheduled 1x/week due to high copay   PT Treatment/Interventions Therapeutic activities;Patient/family education;Therapeutic exercise;ADLs/Self Care Home Management;Moist Heat;Cryotherapy;Neuromuscular re-education;Passive range of motion;Manual techniques   PT Next Visit Plan Check HEP, manual techniques L UE, neural gliding   Consulted and Agree with Plan of Care Patient         Problem List Patient Active Problem List   Diagnosis Date Noted  . Neck pain 10/21/2014  . LBP (low back pain) 10/21/2014  . Essential hypertension 07/15/2014  . HLD (hyperlipidemia) 03/24/2014  .  Headache on top of head 03/24/2014  . TIA (transient ischemic attack) 03/13/2014  . Asthma with acute exacerbation 02/25/2014  . Stricture and stenosis of esophagus 12/13/2011  . Family history of malignant neoplasm of gastrointestinal tract 11/01/2011  . Personal history of colonic polyps 11/01/2011  . HYPERTENSION 09/17/2008  . ALLERGIC RHINITIS 09/17/2008  . Intrinsic asthma 09/17/2008  . GERD 09/17/2008  .  CHOLECYSTECTOMY, HX OF 09/17/2008  Thank you for the referral of this patient.   Leighton, Refugio 12/11/2014, 4:45 PM  [facility] [address] [place], [place], [address] Phone: [phone]   Fax:  [phone]

## 2014-12-11 NOTE — Patient Instructions (Signed)
Head Press With [place] chin SLIGHTLY toward chest, keep mouth closed. Feel weight on back of head. Increase weight by pressing head down. Hold _3__ seconds. Relax. Repeat _10__ times. Surface: bed *with 1-2 pillows, depending on comfort level  Copyright  VHI. All rights reserved.  Healthy Back - Shoulder Roll   Stand straight with arms relaxed at sides. Roll shoulders backward continuously. Do __5__ times. Increase repetitions gradually up to _10___.   Copyright  VHI. All rights reserved.

## 2014-12-16 ENCOUNTER — Ambulatory Visit: Payer: Commercial Managed Care - HMO | Admitting: Rehabilitative and Restorative Service Providers"

## 2014-12-29 ENCOUNTER — Ambulatory Visit: Payer: Commercial Managed Care - HMO | Admitting: Rehabilitative and Restorative Service Providers"

## 2015-01-06 ENCOUNTER — Ambulatory Visit: Payer: Commercial Managed Care - HMO | Admitting: Rehabilitative and Restorative Service Providers"

## 2015-01-13 ENCOUNTER — Ambulatory Visit
Payer: Commercial Managed Care - HMO | Attending: Neurology | Admitting: Rehabilitative and Restorative Service Providers"

## 2015-01-13 DIAGNOSIS — M6281 Muscle weakness (generalized): Secondary | ICD-10-CM | POA: Diagnosis not present

## 2015-01-13 DIAGNOSIS — M542 Cervicalgia: Secondary | ICD-10-CM | POA: Insufficient documentation

## 2015-01-13 DIAGNOSIS — R293 Abnormal posture: Secondary | ICD-10-CM | POA: Diagnosis present

## 2015-01-13 NOTE — Patient Instructions (Addendum)
For home sleeping positions: 1)  Add a towel roll under the curve of your neck to support your spine. 2) Add a pillow for your arms to hug. 3) Add a pillow between your knees when lying on your side and under your legs when lying on your back.  Bridging   Slowly raise buttocks from floor, keeping stomach tight. Repeat __10__ times per set, holding 5 seconds. . Do __1__ sets per session. Do __1-2__ sessions per day.  http://orth.exer.us/1097   Copyright  VHI. All rights reserved.  Piriformis Stretch, Supine   Lie supine, legs bent, feet flat.  Cross right ankle to left knee and pull right knee towards your chest until you feel a stretch. Hold __20_ seconds.  Repeat _2-3__ times per session. Do _1-2__ sessions per day. Perform with other leg straight.  Copyright  VHI. All rights reserved.  Piriformis Stretch, Sitting   Sit, back straight, leg bent, ankle on opposite knee. Sit up tall and lean gently forward until your feels a stretch.  Feel stretch in gluteals. Hold _20__ seconds.  Repeat _2-3__ times per session. Do _1-2__ sessions per day.  Copyright  VHI. All rights reserved.  Double Knee to Chest (Flexion)   Gently pull both knees toward chest. Feel stretch in lower back or buttock area. Breathing deeply, Hold __20__ seconds. Repeat __2-3__ times. Do __1-2__ sessions per day.  http://gt2.exer.us/228   Copyright  VHI. All rights reserved.  Scapular Retraction: Rowing (Eccentric) - Arms - Side (Resistance Band)   Hold end of band in each hand. Pull back until elbows are even with trunk *down by your side.  Use _yellow, then progress to red_ resistance band. _8-10__ reps, 1-2 times/day.   Copyright  VHI. All rights reserved.

## 2015-01-13 NOTE — Therapy (Signed)
[facility] [address] [place], [place], [address] Phone: [phone]   Fax:  [phone]  Physical Therapy Treatment  Patient Details  Name: [name] MRN: [id number] Date of Birth: [birth year] Referring Provider:  Leighton Ruff, MD  Encounter Date: 01/13/2015      PT End of Session - 01/13/15 1322    Visit Number 2   Number of Visits 8   Date for PT Re-Evaluation 02/08/15   Authorization Type [employer] HMO/ [facility] provider   PT Start Time 0930   PT Stop Time 1018   PT Time Calculation (min) 48 min   Activity Tolerance Patient tolerated treatment well   Behavior During Therapy [facility] for tasks assessed/performed      Past Medical History  Diagnosis Date  . Hypertension   . H/O seasonal allergies   . Asthma   . GERD (gastroesophageal reflux disease)   . Hx of colonic polyp   . Hemorrhoids, internal   . Hyperlipemia   . Asthma   . Allergy   . Arthritis   . Cataract   . Depression   . Heart murmur   . Diverticulosis 10-2011    Colonoscopy  . Hemorrhoids 10-2011    Colonoscopy  . 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    EGD  . TIA (transient ischemic attack)     Past Surgical History  Procedure Laterality Date  . Cesarean section  1962  . Laparoscopic cholecystectomy  1995    There were no vitals filed for this visit.  Visit Diagnosis:  Generalized muscle weakness  Neck pain  Abnormal posture      Subjective Assessment - 01/13/15 0935    Subjective The patient reports her neck pain is much better since beginning HEP.    Currently in Pain? Yes   Pain Score 2   no meds   Pain Location Neck   Pain Orientation Left   Pain Descriptors / Indicators Aching   Pain Type Chronic pain   Pain Radiating Towards L UE; no further radiation into L arm with HEP.   Pain Onset More than a month ago   Pain Frequency Intermittent   Aggravating Factors  picking up heavy items   Pain Relieving Factors  tylenol   Multiple Pain Sites Yes   Pain Score 5   Pain Location Back   Pain Orientation Lower   Pain Descriptors / Indicators Discomfort   Pain Type Chronic pain   Pain Radiating Towards radiates into hips bilaterally   Pain Onset More than a month ago   Pain Frequency Intermittent   Aggravating Factors  walking longer disances   Pain Relieving Factors tylenol, avoiding lifting heavy items     THERAPEUTIC EXERCISE: Discussed/reviewed HEP of axial extension and shoulder rolls Provided HEP for LBP (as currently more prevalent and limiting than neck) including: Bridges Piriformis stretch seated and supine Double knee to chest supine Seated trunk flexion Postural stability with scapular stabilization red band (provided red and yellow for home)  Grip strength: 52 lbs R  25 lbs L MMT: 4/5 L shoulder flexion/abduction 5/5 L elbow flexion 4/5 wrist flexion/extension  SELF CARE/HOME MANAGEMENT: Discussed and demonstrated sleeping positions and postural alignment working on sidelying comfort and supine comfort for pain mgmt.  Patient able to verbalize and return demo understanding.       PT Education - 01/13/15 1321    Education provided Yes   Education Details HEP: bridging, piriformis stretch, double knee to chest, scapular retraction.  Educated on sleeping positions for pain mgmt.   Person(s) Educated Patient   Methods Explanation;Demonstration;Handout   Comprehension Verbalized understanding;Returned demonstration          PT Short Term Goals - 01/13/15 1322    PT SHORT TERM GOAL #1   Title The patient will be indep with HEP for neck and lumbar flexibility.   Baseline Met on 01/13/2015   Time 4   Period Weeks   Status Achieved   PT SHORT TERM GOAL #2   Title The patient will return demo sleeping positions for improved comfort at night.   Baseline Met on 01/13/2015.   Time 4   Period Weeks   Status Achieved   PT SHORT TERM GOAL #3   Title The patient will report  no pain with R lateral neck flexion (in L upper trap).   Baseline Target date 01/09/2015   Time 4   Period Weeks   Status On-going   PT SHORT TERM GOAL #4   Title The patient will increase L UE strength to 4/5 in shoulder flexion/abduction, and wrist flexion/extension.   Baseline Met on 01/13/2015 with patient scoring 4/5 on MMT.   Time 4   Period Weeks   Status Achieved           PT Long Term Goals - 01/13/15 1324    PT LONG TERM GOAL #1   Title The patient will be indep with HEP for postural strengthening.   Baseline Target date 02/08/2015   Time 8   Period Weeks   Status On-going   PT LONG TERM GOAL #2   Title The patient will improve on neck disability index from 44% to < or equal to 30%.   Baseline Target date 02/08/2015   Time 8   Period Weeks   Status On-going   PT LONG TERM GOAL #3   Title The patient will improve L grip from 15lbs to 25 lbs.   Baseline Met on n7/27/2016   Time 8   Period Weeks   Status Achieved   PT LONG TERM GOAL #4   Title The patient will improve L UE strength to 4/5 wrist flexion/extension.   Baseline Met on 01/13/2015   Time 8   Period Weeks   Status Achieved   PT LONG TERM GOAL #5   Title The patient will improve bilateral ankle dorsiflexion to 4/5.   Baseline Target date 02/08/2015   Time 8   Period Weeks   Status On-going               Plan - 01/13/15 1324    Clinical Impression Statement The patient seen for first f/u visit since eval due to high copay.  She reports significant reduction in L UE radiating symptoms with exercises provided at evaluation.  At today's visit, we focused on sleeping positions for comfort/postural alignment, LE/back/hip stretching, postural strengthening.  PT to f/u in 2-4 weeks (as patient can due to copay) to check HEP and update as needed.  Patient met 2 LTGs and 3 STGs.  She is motivated to participate in HEP.   PT Next Visit Plan Check HEP, manual techniques L UE, neural gliding; core and postural  stabilization.   Consulted and Agree with Plan of Care Patient        Problem List Patient Active Problem List   Diagnosis Date Noted  . Neck pain 10/21/2014  . LBP (low back pain) 10/21/2014  . Essential hypertension 07/15/2014  . HLD (hyperlipidemia) 03/24/2014  .  Headache on top of head 03/24/2014  . TIA (transient ischemic attack) 03/13/2014  . Asthma with acute exacerbation 02/25/2014  . Stricture and stenosis of esophagus 12/13/2011  . Family history of malignant neoplasm of gastrointestinal tract 11/01/2011  . Personal history of colonic polyps 11/01/2011  . HYPERTENSION 09/17/2008  . ALLERGIC RHINITIS 09/17/2008  . Intrinsic asthma 09/17/2008  . GERD 09/17/2008  . CHOLECYSTECTOMY, HX OF 09/17/2008    Kymora Sciara, PT 01/13/2015, 1:28 PM  [facility] [address] [place], [place], [address] Phone: [phone]   Fax:  [phone]

## 2015-01-20 ENCOUNTER — Ambulatory Visit: Payer: Commercial Managed Care - HMO | Admitting: Rehabilitative and Restorative Service Providers"

## 2015-02-03 ENCOUNTER — Ambulatory Visit: Payer: Commercial Managed Care - HMO | Admitting: Rehabilitative and Restorative Service Providers"

## 2015-02-19 ENCOUNTER — Ambulatory Visit: Payer: Commercial Managed Care - HMO | Admitting: Rehabilitative and Restorative Service Providers"

## 2015-02-19 ENCOUNTER — Ambulatory Visit
Admission: RE | Admit: 2015-02-19 | Discharge: 2015-02-19 | Disposition: A | Discharge status: Home / Self Care | Payer: Commercial Managed Care - HMO | Source: Ambulatory Visit | Attending: Family Medicine | Admitting: Family Medicine

## 2015-02-19 ENCOUNTER — Other Ambulatory Visit: Payer: Self-pay | Admitting: Family Medicine

## 2015-02-25 ENCOUNTER — Encounter: Payer: Self-pay | Admitting: Emergency Medicine

## 2015-02-25 ENCOUNTER — Ambulatory Visit (INDEPENDENT_AMBULATORY_CARE_PROVIDER_SITE_OTHER): Payer: Commercial Managed Care - HMO | Admitting: Emergency Medicine

## 2015-02-25 VITALS — BP 122/80 | HR 62 | Ht 60.0 in | Wt 141.0 lb

## 2015-02-25 DIAGNOSIS — J45909 Unspecified asthma, uncomplicated: Secondary | ICD-10-CM

## 2015-02-25 NOTE — Assessment & Plan Note (Signed)
Need to get her back on maintenance therapy, she clearly misses the [facility] when unable to take it  We will work on getting samples and financial assistance for your [place]. We need to restart it reliably 2 puffs twice a day.  Take albuterol 2 puffs up to every 4 hours if needed for shortness of breath.  Get the flu shot this Fall Follow with Dr Lamonte Sakai in 3 months or sooner if you have any problems.

## 2015-02-25 NOTE — Progress Notes (Signed)
Subjective:    Patient ID: [name], female    DOB: [birth year], 79 y.o.   MRN: [id number]  HPI 79 year old woman, former remote smoker, followed by Dr Gwenette Greet for asthma. She has been more SOB - she has been on/off Dulera because she has been depending on samples. She has a sample with 10 puffs left. Using qd instead of bid. She is a difficult hx giver - unsure when the last time she needed pred was. Her breathing is better when she is on the [facility].    Review of Systems Per HPI  Past Medical History  Diagnosis Date  . Hypertension   . H/O seasonal allergies   . Asthma   . GERD (gastroesophageal reflux disease)   . Hx of colonic polyp   . Hemorrhoids, internal   . Hyperlipemia   . Asthma   . Allergy   . Arthritis   . Cataract   . Depression   . Heart murmur   . Diverticulosis 10-2011    Colonoscopy  . Hemorrhoids 10-2011    Colonoscopy  . 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    EGD  . TIA (transient ischemic attack)      Family History  Problem Relation Age of Onset  . Colon cancer Sister 35  . Stomach cancer Neg Hx   . Breast cancer      Niece  . Irritable bowel syndrome Sister      Social History   Social History  . Marital Status: Widowed    Spouse Name: N/A  . Number of Children: 1  . Years of Education: college   Occupational History  . Customer Service   .     Social History Main Topics  . Smoking status: Former Smoker -- 0.50 packs/day    Types: Cigarettes    Quit date: 09/28/1959  . Smokeless tobacco: Never Used  . Alcohol Use: No  . Drug Use: No  . Sexual Activity: Not on file   Other Topics Concern  . Not on file   Social History Narrative   Patient is widowed with one child.   Patient is right handed.   Patient has college education.   Patient does not drink caffeine.     Allergies  Allergen Reactions  . Cepacol Shortness Of Breath    Difficulty breathing  . Lactose Intolerance (Gi) Shortness Of Breath    All dairy  Causes  shortness of breath  . Other Hives, Shortness Of Breath and Swelling    Mushrooms (All mushrooms)  . Penicillins Shortness Of Breath and Swelling    Tongue and eyes swell     Outpatient Prescriptions Prior to Visit  Medication Sig Dispense Refill  . Albuterol Sulfate (PROAIR RESPICLICK) 564 (90 BASE) MCG/ACT AEPB Inhale 2 puffs into the lungs every 6 (six) hours as needed. 1 each 3  . Alpha-D-Galactosidase (BEANO PO) Take 1 tablet by mouth as needed (gas and bloating).     . Ascorbic Acid (VITAMIN C) 1000 MG tablet Take 1,000 mg by mouth daily as needed (Cold).     . clopidogrel (PLAVIX) 75 MG tablet Take 75 mg by mouth daily.  1  . cycloSPORINE (RESTASIS) 0.05 % ophthalmic emulsion Place 1 drop into both eyes at bedtime as needed (for dry eyes or irritation).    Marland Kitchen losartan-hydrochlorothiazide (HYZAAR) 50-12.5 MG per tablet Take 1 tablet by mouth daily.     . mometasone-formoterol (DULERA) 100-5 MCG/ACT AERO Inhale 2 puffs into the lungs 2 (two)  times daily. (Patient taking differently: Inhale 2 puffs into the lungs daily. ) 1 Inhaler 0  . Multiple Vitamin (MULTIVITAMIN) tablet Take 1 tablet by mouth daily.    . polyethylene glycol (MIRALAX / GLYCOLAX) packet Take 17 g by mouth 2 (two) times daily. (Patient taking differently: Take 17 g by mouth daily as needed for mild constipation. ) 60 each 0  . Probiotic Product (PROBIOTIC PO) Take 1 capsule by mouth 3 (three) times daily after meals.     . ranitidine (ZANTAC) 75 MG tablet Take 75 mg by mouth daily as needed for heartburn.    . simvastatin (ZOCOR) 20 MG tablet Take 1 tablet (20 mg total) by mouth daily at 6 PM. (Patient taking differently: Take 20 mg by mouth as needed. ) 90 tablet 3  . Vitamin D, Ergocalciferol, (DRISDOL) 50000 UNITS CAPS capsule Take 50,000 Units by mouth once a week. Take on Sundays    . nortriptyline (PAMELOR) 25 MG capsule Take 1 capsule (25 mg total) by mouth at bedtime. 30 capsule 3  . predniSONE (DELTASONE) 20 MG  tablet Take 1 tablet (20 mg total) by mouth 2 (two) times daily. 10 tablet 0  . aspirin EC 325 MG tablet Take 1 tablet (325 mg total) by mouth daily. (Patient not taking: Reported on 12/11/2014) 30 tablet 2  . pantoprazole (PROTONIX) 40 MG tablet Take 40 mg by mouth daily.     No facility-administered medications prior to visit.         Objective:   Physical Exam Filed Vitals:   02/25/15 0937  BP: 122/80  Pulse: 62  Height: 5' (1.524 m)  Weight: 141 lb (63.957 kg)  SpO2: 98%   Gen: Pleasant, well-nourished, in no distress,  normal affect  ENT: No lesions,  mouth clear,  oropharynx clear, no postnasal drip  Neck: No JVD, no TMG, no carotid bruits  Lungs: No use of accessory muscles, distant, clear without rales or rhonchi  Cardiovascular: RRR, heart sounds normal, no murmur or gallops, no peripheral edema  Musculoskeletal: No deformities, no cyanosis or clubbing  Neuro: alert, non focal  Skin: Warm, no lesions or rashes      Assessment & Plan:  Intrinsic asthma Need to get her back on maintenance therapy, she clearly misses the [facility] when unable to take it  We will work on getting samples and financial assistance for your [place]. We need to restart it reliably 2 puffs twice a day.  Take albuterol 2 puffs up to every 4 hours if needed for shortness of breath.  Get the flu shot this Fall Follow with Dr Lamonte Sakai in 3 months or sooner if you have any problems.

## 2015-02-25 NOTE — Patient Instructions (Signed)
We will work on getting samples and financial assistance for your [place]. We need to restart it reliably 2 puffs twice a day.  Take albuterol 2 puffs up to every 4 hours if needed for shortness of breath.  Get the flu shot this Fall Follow with Dr Lamonte Sakai in 3 months or sooner if you have any problems.

## 2015-03-04 ENCOUNTER — Telehealth: Payer: Self-pay | Admitting: Emergency Medicine

## 2015-03-04 NOTE — Telephone Encounter (Signed)
Recieved forms and placed in RB's look ats.   Will forward to [place] to follow.

## 2015-03-05 NOTE — Telephone Encounter (Signed)
[name] - has this been completed?  Please advise. 

## 2015-03-05 NOTE — Telephone Encounter (Signed)
I have not had a chance to look at these forms as I was in [facility]ergy yesterday and I am in [place] today.

## 2015-03-08 NOTE — Telephone Encounter (Signed)
Forms have been filled out. I will have RB sign them when he is back in the office tomorrow.

## 2015-03-08 NOTE — Telephone Encounter (Signed)
Will forward message back to [place] to follow up on.  Thanks Ria Comment!

## 2015-03-09 NOTE — Telephone Encounter (Signed)
Forms have been signed by RB. They have been placed in the outgoing mail to [facility]. Pt is aware of this. Will leave message open to follow up on.

## 2015-03-15 NOTE — Telephone Encounter (Signed)
I have not received anything else as of today on pt's forms. Will leave message open to follow up on.

## 2015-04-02 ENCOUNTER — Telehealth: Payer: Self-pay | Admitting: Emergency Medicine

## 2015-04-02 MED ORDER — MOMETASONE FURO-FORMOTEROL FUM 100-5 MCG/ACT IN AERO: 2.0000 | INHALATION_SPRAY | Freq: Two times a day (BID) | RESPIRATORY_TRACT | Status: DC

## 2015-04-02 NOTE — Telephone Encounter (Signed)
Spoke with pt and is aware samples left for pick up. Nothing further needed

## 2015-04-30 ENCOUNTER — Encounter: Payer: Self-pay | Admitting: Rehabilitative and Restorative Service Providers"

## 2015-04-30 NOTE — Therapy (Signed)
[facility] [address] [place], [place], [address] Phone: [phone]   Fax:  [phone]  Patient Details  Name: [name] MRN: [id number] Date of Birth: [birth year] Referring Provider:  No ref. provider found  Encounter Date: last encounter 01/13/2015  PHYSICAL THERAPY DISCHARGE SUMMARY  Visits from Start of Care: 2  Current functional level related to goals / functional outcomes:     PT Short Term Goals - 01/13/15 1322    PT SHORT TERM GOAL #1   Title The patient will be indep with HEP for neck and lumbar flexibility.   Baseline Met on 01/13/2015   Time 4   Period Weeks   Status Achieved   PT SHORT TERM GOAL #2   Title The patient will return demo sleeping positions for improved comfort at night.   Baseline Met on 01/13/2015.   Time 4   Period Weeks   Status Achieved   PT SHORT TERM GOAL #3   Title The patient will report no pain with R lateral neck flexion (in L upper trap).   Baseline Target date 01/09/2015   Time 4   Period Weeks   Status On-going   PT SHORT TERM GOAL #4   Title The patient will increase L UE strength to 4/5 in shoulder flexion/abduction, and wrist flexion/extension.   Baseline Met on 01/13/2015 with patient scoring 4/5 on MMT.   Time 4   Period Weeks   Status Achieved         PT Long Term Goals - 01/13/15 1324    PT LONG TERM GOAL #1   Title The patient will be indep with HEP for postural strengthening.   Baseline Target date 02/08/2015   Time 8   Period Weeks   Status On-going   PT LONG TERM GOAL #2   Title The patient will improve on neck disability index from 44% to < or equal to 30%.   Baseline Target date 02/08/2015   Time 8   Period Weeks   Status On-going   PT LONG TERM GOAL #3   Title The patient will improve L grip from 15lbs to 25 lbs.   Baseline Met on n7/27/2016   Time 8   Period Weeks   Status Achieved   PT LONG TERM GOAL #4   Title The patient will  improve L UE strength to 4/5 wrist flexion/extension.   Baseline Met on 01/13/2015   Time 8   Period Weeks   Status Achieved   PT LONG TERM GOAL #5   Title The patient will improve bilateral ankle dorsiflexion to 4/5.   Baseline Target date 02/08/2015   Time 8   Period Weeks   Status On-going        Remaining deficits: Patient did not return for f/u visit at 2nd visit, she noted improvement.  She had barrier of financial co-pay for PT   Education / Equipment: HEP  Plan: Patient agrees to discharge.  Patient goals were partially met. Patient is being discharged due to not returning since the last visit.  ?????       Thank you for the referral of this patient. Rudell Cobb, MPT   Miesha Bachmann 04/30/2015, 10:52 AM  [facility] [address] [place], [place], [address] Phone: [phone]   Fax:  [phone]

## 2015-05-04 ENCOUNTER — Ambulatory Visit: Payer: Commercial Managed Care - HMO | Admitting: Emergency Medicine

## 2015-05-04 ENCOUNTER — Telehealth: Payer: Self-pay | Admitting: Emergency Medicine

## 2015-05-04 NOTE — Telephone Encounter (Signed)
lmtcb x1 for pt. 

## 2015-05-04 NOTE — Telephone Encounter (Signed)
[id number] calling back

## 2015-05-04 NOTE — Telephone Encounter (Signed)
Spoke with pt, c/o increased wheezing, nonprod cough.  Pt states that the [facility] she started taking 2 puffs bid at her last ov is making her BP rise.  Was not able to give me a range of what her BP is staying at.  Pt wants further recs on her med mgmt regarding her bp.    Pt uses [facility] on [address].    Sending to doc of the day as RB is unavailable.  SN please advise.  Thanks!

## 2015-05-04 NOTE — Telephone Encounter (Signed)
Per SN >> pt will need to see RB.  Spoke with pt. SHE ONLY WANTS AM OV. I have scheduled her to see TP tomorrow morning at 10am. Nothing further was needed.

## 2015-05-05 ENCOUNTER — Ambulatory Visit: Payer: Commercial Managed Care - HMO | Admitting: Adult Health

## 2015-05-10 DIAGNOSIS — J4550 Severe persistent asthma, uncomplicated: Secondary | ICD-10-CM | POA: Insufficient documentation

## 2015-05-24 ENCOUNTER — Ambulatory Visit: Payer: Commercial Managed Care - HMO | Admitting: Adult Health

## 2015-06-28 ENCOUNTER — Ambulatory Visit: Payer: Commercial Managed Care - HMO | Admitting: Cardiology

## 2015-06-29 ENCOUNTER — Ambulatory Visit: Payer: Commercial Managed Care - HMO | Admitting: Adult Health

## 2015-07-02 ENCOUNTER — Ambulatory Visit: Payer: Commercial Managed Care - HMO | Admitting: Emergency Medicine

## 2015-07-06 ENCOUNTER — Ambulatory Visit: Payer: Commercial Managed Care - HMO | Admitting: Emergency Medicine

## 2015-07-06 ENCOUNTER — Telehealth: Payer: Self-pay | Admitting: Emergency Medicine

## 2015-07-06 MED ORDER — MOMETASONE FURO-FORMOTEROL FUM 100-5 MCG/ACT IN AERO: 2.0000 | INHALATION_SPRAY | Freq: Two times a day (BID) | RESPIRATORY_TRACT | Status: DC

## 2015-07-06 NOTE — Telephone Encounter (Signed)
Spoke with pt, states she thought her appt was today.  RB is doublebooked, TP said we could use a blocked slot this afternoon for pt. Spoke with pt, declined appt saying she could not wait that late to be seen.  Pt will keep her appt for next Wednesday.  Pt asked for dulera samples, these were given.  Pt also asked for pt assistance forms for dulera, these were given also.  Nothing further needed at this time.

## 2015-07-14 ENCOUNTER — Ambulatory Visit: Payer: Commercial Managed Care - HMO | Admitting: Adult Health

## 2015-07-14 ENCOUNTER — Encounter: Payer: Self-pay | Admitting: Adult Health

## 2015-07-14 ENCOUNTER — Ambulatory Visit (INDEPENDENT_AMBULATORY_CARE_PROVIDER_SITE_OTHER): Payer: Commercial Managed Care - HMO | Admitting: Adult Health

## 2015-07-14 VITALS — BP 114/76 | HR 69 | Temp 97.7°F | Ht 60.0 in | Wt 141.0 lb

## 2015-07-14 DIAGNOSIS — R059 Cough, unspecified: Secondary | ICD-10-CM

## 2015-07-14 DIAGNOSIS — J4541 Moderate persistent asthma with (acute) exacerbation: Secondary | ICD-10-CM | POA: Diagnosis not present

## 2015-07-14 DIAGNOSIS — R05 Cough: Secondary | ICD-10-CM | POA: Diagnosis not present

## 2015-07-14 MED ORDER — LEVALBUTEROL HCL 0.63 MG/3ML IN NEBU
0.6300 mg | INHALATION_SOLUTION | Freq: Once | RESPIRATORY_TRACT | Status: AC
  Administered 2015-07-14: 0.63 mg via RESPIRATORY_TRACT

## 2015-07-14 MED ORDER — BUDESONIDE-FORMOTEROL FUMARATE 80-4.5 MCG/ACT IN AERO: 2.0000 | INHALATION_SPRAY | Freq: Two times a day (BID) | RESPIRATORY_TRACT | Status: DC

## 2015-07-14 MED ORDER — PREDNISONE 10 MG PO TABS: ORAL_TABLET | ORAL | Status: DC

## 2015-07-14 MED ORDER — ALBUTEROL SULFATE 108 (90 BASE) MCG/ACT IN AEPB: 2.0000 | INHALATION_SPRAY | Freq: Four times a day (QID) | RESPIRATORY_TRACT | Status: DC | PRN

## 2015-07-14 MED ORDER — AZITHROMYCIN 250 MG PO TABS: ORAL_TABLET | ORAL | Status: AC

## 2015-07-14 NOTE — Progress Notes (Signed)
Subjective:    Patient ID: [name], female    DOB: February 23, 1936, 80 y.o.   MRN: [id number]  HPI 80 yo female former smoker  with Asthma   07/14/2015 Follow up : Asthma  Pt returns for a  4 month follow up.  Pt would like to discuss alternatives to dulera. Feels the [facility] is not working.  Feels it make her nervous. Using albuterol most days couple times a day.  Discussed using Albuterol for rescue only .   She is getting Dulera through patient assistance program.   Pt c/o chest congestion at bedtime, prod cough with clear mucus, sinus pressure, SOB and wheezing with activity for 1 week. . Denies any fever, nausea or vomiting, hemoptysis , chest pain, orthopnea, or edema.    Past Medical History  Diagnosis Date  . Hypertension   . H/O seasonal allergies   . Asthma   . GERD (gastroesophageal reflux disease)   . Hx of colonic polyp   . Hemorrhoids, internal   . Hyperlipemia   . Asthma   . Allergy   . Arthritis   . Cataract   . Depression   . Heart murmur   . Diverticulosis 10-2011    Colonoscopy  . Hemorrhoids 10-2011    Colonoscopy  . 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    EGD  . TIA (transient ischemic attack)    Current Outpatient Prescriptions on File Prior to Visit  Medication Sig Dispense Refill  . Albuterol Sulfate (PROAIR RESPICLICK) 123XX123 (90 BASE) MCG/ACT AEPB Inhale 2 puffs into the lungs every 6 (six) hours as needed. 1 each 3  . Alpha-D-Galactosidase (BEANO PO) Take 1 tablet by mouth as needed (gas and bloating).     . Ascorbic Acid (VITAMIN C) 1000 MG tablet Take 1,000 mg by mouth daily as needed (Cold).     . clopidogrel (PLAVIX) 75 MG tablet Take 75 mg by mouth daily.  1  . losartan-hydrochlorothiazide (HYZAAR) 50-12.5 MG per tablet Take 1 tablet by mouth daily.     . mometasone-formoterol (DULERA) 100-5 MCG/ACT AERO Inhale 2 puffs into the lungs 2 (two) times daily. 2 Inhaler 0  . Multiple Vitamin (MULTIVITAMIN) tablet Take 1 tablet by mouth daily.    .  polyethylene glycol (MIRALAX / GLYCOLAX) packet Take 17 g by mouth 2 (two) times daily. (Patient taking differently: Take 17 g by mouth daily as needed for mild constipation. ) 60 each 0  . Probiotic Product (PROBIOTIC PO) Take 1 capsule by mouth 3 (three) times daily after meals.     . simvastatin (ZOCOR) 20 MG tablet Take 1 tablet (20 mg total) by mouth daily at 6 PM. (Patient taking differently: Take 20 mg by mouth as needed. ) 90 tablet 3  . Vitamin D, Ergocalciferol, (DRISDOL) 50000 UNITS CAPS capsule Take 50,000 Units by mouth once a week. Take on Sundays    . cycloSPORINE (RESTASIS) 0.05 % ophthalmic emulsion Place 1 drop into both eyes at bedtime as needed (for dry eyes or irritation). Reported on 07/14/2015     No current facility-administered medications on file prior to visit.    Review of Systems Constitutional:   No  weight loss, night sweats,  Fevers, chills,  +fatigue, or  lassitude.  HEENT:   No headaches,  Difficulty swallowing,  Tooth/dental problems, or  Sore throat,                No sneezing, itching, ear ache,  +nasal congestion, post nasal drip,  CV:  No chest pain,  Orthopnea, PND, swelling in lower extremities, anasarca, dizziness, palpitations, syncope.   GI  No heartburn, indigestion, abdominal pain, nausea, vomiting, diarrhea, change in bowel habits, loss of appetite, bloody stools.   Resp:    No chest wall deformity  Skin: no rash or lesions.  GU: no dysuria, change in color of urine, no urgency or frequency.  No flank pain, no hematuria   MS:  No joint pain or swelling.  No decreased range of motion.  No back pain.  Psych:  No change in mood or affect. No depression or anxiety.  No memory loss.         Objective:   Physical Exam Filed Vitals:   07/14/15 1006  BP: 114/76  Pulse: 69  Temp: 97.7 F (36.5 C)  TempSrc: Oral  Height: 5' (1.524 m)  Weight: 141 lb (63.957 kg)  SpO2: 99%   GEN: A/Ox3; pleasant , NAD, elderly   HEENT:  [place]/AT,   EACs-clear, TMs-wnl, NOSE-clear, THROAT-clear, no lesions, no postnasal drip or exudate noted.   NECK:  Supple w/ fair ROM; no JVD; normal carotid impulses w/o bruits; no thyromegaly or nodules palpated; no lymphadenopathy.  RESP  Decreased BS in bases .no accessory muscle use, no dullness to percussion  CARD:  RRR, no m/r/g  , no peripheral edema, pulses intact, no cyanosis or clubbing.  GI:   Soft & nt; nml bowel sounds; no organomegaly or masses detected.  Musco: Warm bil, no deformities or joint swelling noted.   Neuro: alert, no focal deficits noted.    Skin: Warm, no lesions or rashes        Assessment & Plan:

## 2015-07-14 NOTE — Assessment & Plan Note (Signed)
Flare with URI  Change Dulera per request  Check cxr   Plan  Zpack take as directed.  Prednisone taper over next week.  Chest xray today .  May Stop Dulera .  Begin Symbicort 2 puffs Twice daily  , rinse after use.  Albuterol inhaler is your rescue inhaler.  follow up Dr. Lamonte Sakai  In 2-3 months and As needed   Please contact office for sooner follow up if symptoms do not improve or worsen or seek emergency care

## 2015-07-14 NOTE — Patient Instructions (Addendum)
Zpack take as directed.  Prednisone taper over next week.  Chest xray today .  May Stop Dulera .  Begin Symbicort 2 puffs Twice daily  , rinse after use.  Albuterol inhaler is your rescue inhaler.  follow up Dr. Lamonte Sakai  In 2-3 months and As needed   Please contact office for sooner follow up if symptoms do not improve or worsen or seek emergency care

## 2015-07-14 NOTE — Addendum Note (Signed)
Addended by: Osa Craver on: 07/14/2015 11:35 AM   Modules accepted: Orders

## 2015-07-19 ENCOUNTER — Encounter (HOSPITAL_COMMUNITY): Payer: Self-pay | Admitting: Family Medicine

## 2015-07-19 ENCOUNTER — Emergency Department (HOSPITAL_COMMUNITY): Payer: Commercial Managed Care - HMO

## 2015-07-19 ENCOUNTER — Emergency Department (HOSPITAL_COMMUNITY)
Admission: EM | Admit: 2015-07-19 | Discharge: 2015-07-19 | Disposition: A | Discharge status: Home / Self Care | Payer: Commercial Managed Care - HMO | Attending: Emergency Medicine | Admitting: Emergency Medicine

## 2015-07-19 DIAGNOSIS — Z7902 Long term (current) use of antithrombotics/antiplatelets: Secondary | ICD-10-CM | POA: Diagnosis not present

## 2015-07-19 DIAGNOSIS — Z8673 Personal history of transient ischemic attack (TIA), and cerebral infarction without residual deficits: Secondary | ICD-10-CM | POA: Diagnosis not present

## 2015-07-19 DIAGNOSIS — R42 Dizziness and giddiness: Secondary | ICD-10-CM | POA: Insufficient documentation

## 2015-07-19 DIAGNOSIS — R011 Cardiac murmur, unspecified: Secondary | ICD-10-CM | POA: Diagnosis not present

## 2015-07-19 DIAGNOSIS — E785 Hyperlipidemia, unspecified: Secondary | ICD-10-CM | POA: Diagnosis not present

## 2015-07-19 DIAGNOSIS — Z880 Allergy status to penicillin: Secondary | ICD-10-CM | POA: Diagnosis not present

## 2015-07-19 DIAGNOSIS — Z87891 Personal history of nicotine dependence: Secondary | ICD-10-CM | POA: Insufficient documentation

## 2015-07-19 DIAGNOSIS — Z79899 Other long term (current) drug therapy: Secondary | ICD-10-CM | POA: Insufficient documentation

## 2015-07-19 DIAGNOSIS — I10 Essential (primary) hypertension: Secondary | ICD-10-CM | POA: Insufficient documentation

## 2015-07-19 DIAGNOSIS — R51 Headache: Secondary | ICD-10-CM | POA: Diagnosis present

## 2015-07-19 DIAGNOSIS — K219 Gastro-esophageal reflux disease without esophagitis: Secondary | ICD-10-CM | POA: Diagnosis not present

## 2015-07-19 DIAGNOSIS — J45909 Unspecified asthma, uncomplicated: Secondary | ICD-10-CM | POA: Insufficient documentation

## 2015-07-19 DIAGNOSIS — Z86010 Personal history of colonic polyps: Secondary | ICD-10-CM | POA: Insufficient documentation

## 2015-07-19 LAB — BASIC METABOLIC PANEL
Anion gap: 10 (ref 5–15)
BUN: 16 mg/dL (ref 6–20)
CHLORIDE: 104 mmol/L (ref 101–111)
CO2: 27 mmol/L (ref 22–32)
CREATININE: 0.93 mg/dL (ref 0.44–1.00)
Calcium: 9.5 mg/dL (ref 8.9–10.3)
GFR calc Af Amer: >60 mL/min (ref >60)
GFR calc non Af Amer: 57 mL/min — ABNORMAL LOW (ref >60)
Glucose, Bld: 101 mg/dL — ABNORMAL HIGH (ref 65–99)
POTASSIUM: 3.6 mmol/L (ref 3.5–5.1)
SODIUM: 141 mmol/L (ref 135–145)

## 2015-07-19 LAB — CBC WITH DIFFERENTIAL/PLATELET
BASOS PCT: 0 %
Basophils Absolute: 0 10*3/uL (ref 0.0–0.1)
Eosinophils Absolute: 0 10*3/uL (ref 0.0–0.7)
Eosinophils Relative: 0 %
HCT: 38.4 % (ref 36.0–46.0)
HEMOGLOBIN: 13 g/dL (ref 12.0–15.0)
Lymphocytes Relative: 39 %
Lymphs Abs: 2.8 10*3/uL (ref 0.7–4.0)
MCH: 29.9 pg (ref 26.0–34.0)
MCHC: 33.9 g/dL (ref 30.0–36.0)
MCV: 88.3 fL (ref 78.0–100.0)
Monocytes Absolute: 0.5 10*3/uL (ref 0.1–1.0)
Monocytes Relative: 8 %
NEUTROS PCT: 53 %
Neutro Abs: 3.7 10*3/uL (ref 1.7–7.7)
Platelets: 339 10*3/uL (ref 150–400)
RBC: 4.35 MIL/uL (ref 3.87–5.11)
RDW: 12.9 % (ref 11.5–15.5)
WBC: 7 10*3/uL (ref 4.0–10.5)

## 2015-07-19 MED ORDER — MECLIZINE HCL 25 MG PO TABS
25.0000 mg | ORAL_TABLET | Freq: Once | ORAL | Status: AC
  Administered 2015-07-19: 25 mg via ORAL
  Filled 2015-07-19: qty 1

## 2015-07-19 MED ORDER — MECLIZINE HCL 25 MG PO TABS: 25.0000 mg | ORAL_TABLET | Freq: Three times a day (TID) | ORAL | Status: DC | PRN

## 2015-07-19 NOTE — ED Notes (Addendum)
Pt here for evaluation of hypertension and headache. sts started this am with some numbness circling mouth. Pt no other deficits and recently started abx for URI. sts some nosebleeds yesterday.

## 2015-07-19 NOTE — Discharge Instructions (Signed)
Meclizine until 48 hours without symptoms before stopping.  Benign Positional Vertigo Vertigo is the feeling that you or your surroundings are moving when they are not. Benign positional vertigo is the most common form of vertigo. The cause of this condition is not serious (is benign). This condition is triggered by certain movements and positions (is positional). This condition can be dangerous if it occurs while you are doing something that could endanger you or others, such as driving.  CAUSES In many cases, the cause of this condition is not known. It may be caused by a disturbance in an area of the inner ear that helps your brain to sense movement and balance. This disturbance can be caused by a viral infection (labyrinthitis), head injury, or repetitive motion. RISK FACTORS This condition is more likely to develop in:  Women.  People who are 78 years of age or older. SYMPTOMS Symptoms of this condition usually happen when you move your head or your eyes in different directions. Symptoms may start suddenly, and they usually last for less than a minute. Symptoms may include:  Loss of balance and falling.  Feeling like you are spinning or moving.  Feeling like your surroundings are spinning or moving.  Nausea and vomiting.  Blurred vision.  Dizziness.  Involuntary eye movement (nystagmus). Symptoms can be mild and cause only slight annoyance, or they can be severe and interfere with daily life. Episodes of benign positional vertigo may return (recur) over time, and they may be triggered by certain movements. Symptoms may improve over time. DIAGNOSIS This condition is usually diagnosed by medical history and a physical exam of the head, neck, and ears. You may be referred to a health care provider who specializes in ear, nose, and throat (ENT) problems (otolaryngologist) or a provider who specializes in disorders of the nervous system (neurologist). You may have additional testing,  including:  MRI.  A CT scan.  Eye movement tests. Your health care provider may ask you to change positions quickly while he or she watches you for symptoms of benign positional vertigo, such as nystagmus. Eye movement may be tested with an electronystagmogram (ENG), caloric stimulation, the Dix-Hallpike test, or the roll test.  An electroencephalogram (EEG). This records electrical activity in your brain.  Hearing tests. TREATMENT Usually, your health care provider will treat this by moving your head in specific positions to adjust your inner ear back to normal. Surgery may be needed in severe cases, but this is rare. In some cases, benign positional vertigo may resolve on its own in 2-4 weeks. HOME CARE INSTRUCTIONS Safety  Move slowly.Avoid sudden body or head movements.  Avoid driving.  Avoid operating heavy machinery.  Avoid doing any tasks that would be dangerous to you or others if a vertigo episode would occur.  If you have trouble walking or keeping your balance, try using a cane for stability. If you feel dizzy or unstable, sit down right away.  Return to your normal activities as told by your health care provider. Ask your health care provider what activities are safe for you. General Instructions  Take over-the-counter and prescription medicines only as told by your health care provider.  Avoid certain positions or movements as told by your health care provider.  Drink enough fluid to keep your urine clear or pale yellow.  Keep all follow-up visits as told by your health care provider. This is important. SEEK MEDICAL CARE IF:  You have a fever.  Your condition gets worse or  you develop new symptoms.  Your family or friends notice any behavioral changes.  Your nausea or vomiting gets worse.  You have numbness or a "pins and needles" sensation. SEEK IMMEDIATE MEDICAL CARE IF:  You have difficulty speaking or moving.  You are always dizzy.  You  faint.  You develop severe headaches.  You have weakness in your legs or arms.  You have changes in your hearing or vision.  You develop a stiff neck.  You develop sensitivity to light.   This information is not intended to replace advice given to you by your health care provider. Make sure you discuss any questions you have with your health care provider.   Document Released: 03/13/2006 Document Revised: 02/24/2015 Document Reviewed: 09/28/2014 Elsevier Interactive Patient Education [employer].

## 2015-07-19 NOTE — ED Notes (Signed)
Patient transported to CT 

## 2015-07-20 NOTE — ED Provider Notes (Signed)
CSN: [id number]     Arrival date & time 07/19/15  1324 History   First MD Initiated Contact with Patient 07/19/15 1635     Chief Complaint  Patient presents with  . Headache  . Hypertension     HPI  Is presents for evaluation of headache and hypertension. She waking this morning. States her first symptom that she had her she felt like things were moving. When she attempted to walk it made her feel like she was having to hold onto things. This was mild but it persisted today. Had a viral syndrome last week with cough and upper S2 infection symptoms patient on prednisone and amoxicillin by her primary care physician. Feels this is getting better. No ear pain or pressure. Mild posterior headache today. No numbness weakness or tingling to extremities. Difficulty with swallowing vision or speech. Patient blood pressure this morning was 170/103 when she was feeling poorly she became concerned.  Past Medical History  Diagnosis Date  . Hypertension   . H/O seasonal allergies   . Asthma   . GERD (gastroesophageal reflux disease)   . Hx of colonic polyp   . Hemorrhoids, internal   . Hyperlipemia   . Asthma   . Allergy   . Arthritis   . Cataract   . Depression   . Heart murmur   . Diverticulosis 10-2011    Colonoscopy  . Hemorrhoids 10-2011    Colonoscopy  . 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    EGD  . TIA (transient ischemic attack)    Past Surgical History  Procedure Laterality Date  . Cesarean section  1962  . Laparoscopic cholecystectomy  1995   Family History  Problem Relation Age of Onset  . Colon cancer Sister 55  . Stomach cancer Neg Hx   . Breast cancer      Niece  . Irritable bowel syndrome Sister    Social History  Substance Use Topics  . Smoking status: Former Smoker -- 0.50 packs/day    Types: Cigarettes    Quit date: 09/28/1959  . Smokeless tobacco: Never Used  . Alcohol Use: No   OB History    No data available     Review of Systems  Constitutional: Negative  for fever, chills, diaphoresis, appetite change and fatigue.  HENT: Negative for mouth sores, sore throat and trouble swallowing.   Eyes: Negative for visual disturbance.  Respiratory: Negative for cough, chest tightness, shortness of breath and wheezing.   Cardiovascular: Negative for chest pain.  Gastrointestinal: Negative for nausea, vomiting, abdominal pain, diarrhea and abdominal distention.  Endocrine: Negative for polydipsia, polyphagia and polyuria.  Genitourinary: Negative for dysuria, frequency and hematuria.  Musculoskeletal: Negative for gait problem.  Skin: Negative for color change, pallor and rash.  Neurological: Positive for dizziness and headaches. Negative for syncope and light-headedness.  Hematological: Does not bruise/bleed easily.  Psychiatric/Behavioral: Negative for behavioral problems and confusion.      Allergies  Cepacol; Lactose intolerance (gi); Other; and Penicillins  Home Medications   Prior to Admission medications   Medication Sig Start Date End Date Taking? Authorizing Provider  Albuterol Sulfate (PROAIR RESPICLICK) [id number] (90 Base) MCG/ACT AEPB Inhale 2 puffs into the lungs every 6 (six) hours as needed. Patient taking differently: Inhale 2 puffs into the lungs every 6 (six) hours as needed. For shortness of breath 07/14/15  Yes Tammy S Parrett, NP  Alpha-D-Galactosidase (BEANO PO) Take 1 tablet by mouth as needed (gas and bloating).    Yes Historical Provider,  MD  Ascorbic Acid (VITAMIN C) 1000 MG tablet Take 1,000 mg by mouth daily as needed (Cold).    Yes Historical Provider, MD  cholecalciferol (VITAMIN D) 1000 units tablet Take 1,000 Units by mouth daily.   Yes Historical Provider, MD  clopidogrel (PLAVIX) 75 MG tablet Take 75 mg by mouth daily. 09/24/14  Yes Historical Provider, MD  cycloSPORINE (RESTASIS) 0.05 % ophthalmic emulsion Place 1 drop into both eyes at bedtime as needed (for dry eyes or irritation). Reported on 07/14/2015   Yes Historical  Provider, MD  losartan-hydrochlorothiazide (HYZAAR) 50-12.5 MG per tablet Take 1 tablet by mouth daily.  09/01/11  Yes Historical Provider, MD  mometasone-formoterol (DULERA) 100-5 MCG/ACT AERO Inhale 2 puffs into the lungs 2 (two) times daily. 07/06/15  Yes Collene Gobble, MD  Multiple Vitamin (MULTIVITAMIN) tablet Take 1 tablet by mouth daily.   Yes Historical Provider, MD  pantoprazole (PROTONIX) 20 MG tablet Take 20 mg by mouth daily.   Yes Historical Provider, MD  polyethylene glycol (MIRALAX / GLYCOLAX) packet Take 17 g by mouth 2 (two) times daily. Patient taking differently: Take 17 g by mouth daily as needed for mild constipation.  03/15/14  Yes Bonnielee Haff, MD  predniSONE (DELTASONE) 10 MG tablet 4 tabs for 2 days, then 3 tabs for 2 days, 2 tabs for 2 days, then 1 tab for 2 days, then stop Patient taking differently: Take 10 mg by mouth See admin instructions. 4 tabs for 2 days, then 3 tabs for 2 days, 2 tabs for 2 days, then 1 tab for 2 days, then stop 07/14/15  Yes Tammy S Parrett, NP  Probiotic Product (PROBIOTIC PO) Take 1 capsule by mouth 3 (three) times daily after meals.    Yes Historical Provider, MD  budesonide-formoterol (SYMBICORT) 80-4.5 MCG/ACT inhaler Inhale 2 puffs into the lungs 2 (two) times daily. Patient not taking: Reported on 07/19/2015 07/14/15   Fonnie Mu Parrett, NP  meclizine (ANTIVERT) 25 MG tablet Take 1 tablet (25 mg total) by mouth 3 (three) times daily as needed. 07/19/15   Tanna Furry, MD  simvastatin (ZOCOR) 20 MG tablet Take 1 tablet (20 mg total) by mouth daily at 6 PM. Patient not taking: Reported on 07/19/2015 07/15/14   Rosalin Hawking, MD   BP 129/77 mmHg  Pulse 75  Temp(Src) 97.6 F (36.4 C) (Oral)  Resp 18  SpO2 99% Physical Exam  Constitutional: She is oriented to person, place, and time. She appears well-developed and well-nourished. No distress.  HENT:  Head: Normocephalic.  Eyes: Conjunctivae are normal. Pupils are equal, round, and reactive to light.  No scleral icterus.  Neck: Normal range of motion. Neck supple. No thyromegaly present.  Cardiovascular: Normal rate and regular rhythm.  Exam reveals no gallop and no friction rub.   No murmur heard. Pulmonary/Chest: Effort normal and breath sounds normal. No respiratory distress. She has no wheezes. She has no rales.  Abdominal: Soft. Bowel sounds are normal. She exhibits no distension. There is no tenderness. There is no rebound.  Musculoskeletal: Normal range of motion.  Neurological: She is alert and oriented to person, place, and time.  Describes vertigo but has no nystagmus. Symptoms are reproduced with sitting upright. Otherwise intact cranial nerves. Normal gait.  Skin: Skin is warm and dry. No rash noted.  Psychiatric: She has a normal mood and affect. Her behavior is normal.    ED Course  Procedures (including critical care time) Labs Review Labs Reviewed  BASIC METABOLIC PANEL -  Abnormal; Notable for the following:    Glucose, Bld 101 (*)    GFR calc non Af Amer 57 (*)    All other components within normal limits  CBC WITH DIFFERENTIAL/PLATELET    Imaging Review Ct Head Wo Contrast  07/19/2015  CLINICAL DATA:  Vertigo and headache. Pain at base of neck, onset this morning. EXAM: CT HEAD WITHOUT CONTRAST TECHNIQUE: Contiguous axial images were obtained from the base of the skull through the vertex without intravenous contrast. COMPARISON:  10/16/2014 FINDINGS: No intracranial hemorrhage, mass effect, or midline shift. No hydrocephalus. The basilar cisterns are patent. No evidence of territorial infarct. No intracranial fluid collection. Calvarium is intact. Included paranasal sinuses and mastoid air cells are clear. Frontal sinus is hypo pneumatized. IMPRESSION: No acute intracranial abnormality. Electronically Signed   By: Jeb Levering M.D.   On: 07/19/2015 18:35   I have personally reviewed and evaluated these images and lab results as part of my medical  decision-making.   EKG Interpretation None      MDM   Final diagnoses:  Vertigo    Much improved after medications. Mandatory to the bathroom. Symptoms have resolved. Blood pressure at 129/77. Think her hypertension is likely secondary to her acute symptoms from her acute peripheral vertigo.  Appropriate for discharge home. Antivert over the next several days. Take until 48 hours without symptoms. No driving until 48 hours without symptoms.    Tanna Furry, MD 07/20/15 [phone]

## 2015-08-16 NOTE — Progress Notes (Signed)
Cardiology Office Note   Date:  08/17/2015   ID:  [name], DOB 09-May-1936, MRN [id number]  PCP:  Barbara Heck, MD    No chief complaint on file.     History of Present Illness: [name] is a 80 y.o. female who presents for for evaluation of palpitations, fatigue, dizziness and chest pain.  She says that the palpitations have been occurring for a few months but got worse in January. The palpitations occur randomly 2 times weekly lasting 3-4 minutes and resolves.  It feels like her heart is quivering.   She had some CP a few years ago and workup including stress test was fine.  She has been having the CP off and on for about a month.  It occurs a few times a week.  She describes it as a burning but at other times it feels like a tightness.  She has a history of GERD.  Her CP can occur with exertion but also at night when she is asleep.  She takes medication for indigestion that seems to help some with the pain.  She denies any diaphoresis or nausea.  She also has SOB and has underlying asthma that has progressively worsened.      Past Medical History  Diagnosis Date  . Hypertension   . H/O seasonal allergies   . Asthma   . GERD (gastroesophageal reflux disease)   . Hx of colonic polyp   . Hemorrhoids, internal   . Hyperlipemia   . Asthma   . Allergy   . Arthritis   . Cataract   . Depression   . Heart murmur   . Diverticulosis 10-2011    Colonoscopy  . Hemorrhoids 10-2011    Colonoscopy  . 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    EGD  . TIA (transient ischemic attack)     Past Surgical History  Procedure Laterality Date  . Cesarean section  1962  . Laparoscopic cholecystectomy  1995     Current Outpatient Prescriptions  Medication Sig Dispense Refill  . albuterol (PROVENTIL HFA;VENTOLIN HFA) 108 (90 Base) MCG/ACT inhaler Inhale 2 puffs into the lungs every 6 (six) hours as needed for wheezing or shortness of breath.    .  Alpha-D-Galactosidase (BEANO PO) Take 1 tablet by mouth as needed (gas and bloating).     . Ascorbic Acid (VITAMIN C) 1000 MG tablet Take 1,000 mg by mouth daily as needed (Cold).     . budesonide-formoterol (SYMBICORT) 80-4.5 MCG/ACT inhaler Inhale 2 puffs into the lungs 2 (two) times daily. 1 Inhaler 5  . cholecalciferol (VITAMIN D) 1000 units tablet Take 1,000 Units by mouth daily.    . clopidogrel (PLAVIX) 75 MG tablet Take 75 mg by mouth daily.  1  . cycloSPORINE (RESTASIS) 0.05 % ophthalmic emulsion Place 1 drop into both eyes at bedtime as needed (for dry eyes or irritation). Reported on 07/14/2015    . losartan-hydrochlorothiazide (HYZAAR) 50-12.5 MG per tablet Take 1 tablet by mouth daily.     . meclizine (ANTIVERT) 25 MG tablet Take 1 tablet (25 mg total) by mouth 3 (three) times daily as needed. 20 tablet 0  . Multiple Vitamin (MULTIVITAMIN) tablet Take 1 tablet by mouth daily.    . polyethylene glycol (MIRALAX / GLYCOLAX) packet Take 17 g by mouth 2 (two) times daily as needed for mild constipation.    . Probiotic Product (PROBIOTIC  PO) Take 1 capsule by mouth 3 (three) times daily after meals.     . RaNITidine HCl (RANITIDINE 75 PO) Take 1 tablet by mouth 2 (two) times daily after a meal.     . simvastatin (ZOCOR) 20 MG tablet Take 1 tablet (20 mg total) by mouth daily at 6 PM. 90 tablet 3   No current facility-administered medications for this visit.    Allergies:   Cepacol; Lactose intolerance (gi); Other; and Penicillins    Social History:  The patient  reports that she quit smoking about 55 years ago. Her smoking use included Cigarettes. She smoked 0.50 packs per day. She has never used smokeless tobacco. She reports that she does not drink alcohol or use illicit drugs.   Family History:  The patient's family history includes Colon cancer (age of onset: 53) in her sister; Irritable bowel syndrome in her sister. There is no history of Stomach cancer.    ROS:  Please see the  history of present illness.   Otherwise, review of systems are positive for none.   All other systems are reviewed and negative.    PHYSICAL EXAM: VS:  BP 124/70 mmHg  Pulse 90  Ht 5' (1.524 m)  Wt 136 lb 1.9 oz (61.744 kg)  BMI 26.58 kg/m2 , BMI Body mass index is 26.58 kg/(m^2). GEN: Well nourished, well developed, in no acute distress HEENT: normal Neck: no JVD, carotid bruits, or masses Cardiac: RRR; no murmurs, rubs, or gallops,no edema  Respiratory:  clear to auscultation bilaterally, normal work of breathing GI: soft, nontender, nondistended, + BS MS: no deformity or atrophy Skin: warm and dry, no rash Neuro:  Strength and sensation are intact Psych: euthymic mood, full affect   EKG:  EKG is not ordered today.    Recent Labs: 07/19/2015: BUN 16; Creatinine, Ser 0.93; Hemoglobin 13.0; Platelets 339; Potassium 3.6; Sodium 141    Lipid Panel    Component Value Date/Time   CHOL 187 03/13/2014 2314   TRIG 59 03/13/2014 2314   HDL 57 03/13/2014 2314   CHOLHDL 3.3 03/13/2014 2314   VLDL 12 03/13/2014 2314   LDLCALC 118* 03/13/2014 2314      Wt Readings from Last 3 Encounters:  08/17/15 136 lb 1.9 oz (61.744 kg)  07/14/15 141 lb (63.957 kg)  02/25/15 141 lb (63.957 kg)       ASSESSMENT AND PLAN:  1.  Chest pain with atypical symptoms that sound more like GERD and occurs more at night at rest while in bed.  She has a history of GERD in the past and her antacids seem to help some with the pain.  Her CRFs include remote tobacco use and HTN.  Her EKG is nonischemic.  I will get a stress myoview to rule out ischemia. 2.  Palpitations - I will get a 30 day heart monitor to rule out PAF. 3.  SOB that is most likely secondary to underlying asthma and GERD but her symptoms have worsened recently. Will check a 2D echo to assess LVF.   Current medicines are reviewed at length with the patient today.  The patient does not have concerns regarding medicines.  The following  changes have been made:  no change  Labs/ tests ordered today: See above Assessment and Plan No orders of the defined types were placed in this encounter.     Disposition:   FU with PRN pending results of studies  Signed, Sueanne Margarita, MD  08/17/2015 11:27 AM  [facility] [address], [place], [place]  [address] Phone: [phone]; Fax: [phone]

## 2015-08-17 ENCOUNTER — Ambulatory Visit (INDEPENDENT_AMBULATORY_CARE_PROVIDER_SITE_OTHER): Payer: Commercial Managed Care - HMO | Admitting: Cardiology

## 2015-08-17 ENCOUNTER — Encounter: Payer: Self-pay | Admitting: Cardiology

## 2015-08-17 VITALS — BP 124/70 | HR 90 | Ht 60.0 in | Wt 136.1 lb

## 2015-08-17 DIAGNOSIS — R0602 Shortness of breath: Secondary | ICD-10-CM

## 2015-08-17 DIAGNOSIS — R002 Palpitations: Secondary | ICD-10-CM

## 2015-08-17 DIAGNOSIS — I10 Essential (primary) hypertension: Secondary | ICD-10-CM | POA: Diagnosis not present

## 2015-08-17 DIAGNOSIS — E785 Hyperlipidemia, unspecified: Secondary | ICD-10-CM

## 2015-08-17 DIAGNOSIS — R079 Chest pain, unspecified: Secondary | ICD-10-CM | POA: Diagnosis not present

## 2015-08-17 NOTE — Patient Instructions (Signed)
Medication Instructions:  Your physician recommends that you continue on your current medications as directed. Please refer to the Current Medication list given to you today.   Labwork: None  Testing/Procedures: Your physician has requested that you have an echocardiogram. Echocardiography is a painless test that uses sound waves to create images of your heart. It provides your doctor with information about the size and shape of your heart and how well your heart's chambers and valves are working. This procedure takes approximately one hour. There are no restrictions for this procedure.   Dr. Turner recommends you have a NUCLEAR STRESS TEST.  Your physician has recommended that you wear an event monitor. Event monitors are medical devices that record the heart's electrical activity. Doctors most often us these monitors to diagnose arrhythmias. Arrhythmias are problems with the speed or rhythm of the heartbeat. The monitor is a small, portable device. You can wear one while you do your normal daily activities. This is usually used to diagnose what is causing palpitations/syncope (passing out).  Follow-Up: Your physician recommends that you schedule a follow-up appointment AS NEEDED with Dr. Turner pending study results.  Any Other Special Instructions Will Be Listed Below (If Applicable).     If you need a refill on your cardiac medications before your next appointment, please call your pharmacy.   

## 2015-08-24 ENCOUNTER — Telehealth: Payer: Self-pay | Admitting: Cardiology

## 2015-08-24 ENCOUNTER — Ambulatory Visit (INDEPENDENT_AMBULATORY_CARE_PROVIDER_SITE_OTHER): Payer: Commercial Managed Care - HMO

## 2015-08-24 DIAGNOSIS — R002 Palpitations: Secondary | ICD-10-CM | POA: Diagnosis not present

## 2015-08-24 NOTE — Telephone Encounter (Signed)
Pt sent in strip this afternoon and it was NSR but she clicked that she had passed out and did not answer phone for monitoring company or myself.. I contacted her sister and she will check on her and let us know.

## 2015-08-31 ENCOUNTER — Telehealth (HOSPITAL_COMMUNITY): Payer: Self-pay | Admitting: *Deleted

## 2015-08-31 NOTE — Telephone Encounter (Signed)
Patient given detailed instructions per Myocardial Perfusion Study Information Sheet for the test on 09/02/15 at 0945. Patient notified to arrive 15 minutes early and that it is imperative to arrive on time for appointment to keep from having the test rescheduled.  If you need to cancel or reschedule your appointment, please call the office within 24 hours of your appointment. Failure to do so may result in a cancellation of your appointment, and a $50 no show fee. Patient verbalized understanding.Malory Spurr, Ranae Palms

## 2015-09-02 ENCOUNTER — Other Ambulatory Visit (HOSPITAL_COMMUNITY): Payer: Commercial Managed Care - HMO

## 2015-09-02 ENCOUNTER — Encounter (HOSPITAL_COMMUNITY): Payer: Commercial Managed Care - HMO

## 2015-09-17 ENCOUNTER — Other Ambulatory Visit (HOSPITAL_COMMUNITY): Payer: Commercial Managed Care - HMO

## 2015-09-20 ENCOUNTER — Encounter (HOSPITAL_COMMUNITY): Payer: Commercial Managed Care - HMO

## 2015-09-20 ENCOUNTER — Other Ambulatory Visit (HOSPITAL_COMMUNITY): Payer: Commercial Managed Care - HMO

## 2015-10-22 ENCOUNTER — Ambulatory Visit: Payer: Commercial Managed Care - HMO | Admitting: Emergency Medicine

## 2015-12-09 ENCOUNTER — Ambulatory Visit: Payer: Commercial Managed Care - HMO | Admitting: Emergency Medicine

## 2016-01-03 ENCOUNTER — Ambulatory Visit: Payer: Commercial Managed Care - HMO | Admitting: Emergency Medicine

## 2016-02-08 ENCOUNTER — Encounter (INDEPENDENT_AMBULATORY_CARE_PROVIDER_SITE_OTHER): Payer: Self-pay

## 2016-02-08 ENCOUNTER — Encounter: Payer: Self-pay | Admitting: Emergency Medicine

## 2016-02-08 ENCOUNTER — Ambulatory Visit (INDEPENDENT_AMBULATORY_CARE_PROVIDER_SITE_OTHER): Payer: Commercial Managed Care - HMO | Admitting: Emergency Medicine

## 2016-02-08 DIAGNOSIS — K219 Gastro-esophageal reflux disease without esophagitis: Secondary | ICD-10-CM

## 2016-02-08 DIAGNOSIS — J45909 Unspecified asthma, uncomplicated: Secondary | ICD-10-CM

## 2016-02-08 NOTE — Assessment & Plan Note (Signed)
Mild persistent asthma that appears to be for the most part stable but with influences from her GERD which does give her breakthrough symptoms from time to time. She is currently on ranitidine for this. She liked the change from [facility] to Symbicort. I reassured her that I don't believe her systemic corticosteroid dose with the Symbicort is high enough to significantly affect her vision. Did ask her to review this with her ophthalmologist.   Please continue your Symbicort 2 puffs twice a day. Rinse and gargle after using.  Take albuterol 2 puffs up to every 4 hours if needed for shortness of breath.  Be sure to get the flu shot this Fall Continue ranitidine as you are taking it.  You might want to try using over the counter Prilosec to see if helps your reflux Watch your diet to avoid reflux symptoms.  Follow with Dr Lamonte Sakai in 6 months or sooner if you have any problems

## 2016-02-08 NOTE — Progress Notes (Signed)
Subjective:    Patient ID: [name], female    DOB: 19-Jul-1935, 80 y.o.   MRN: [id number]  HPI 80 year old woman, former remote smoker, followed by Dr Gwenette Greet for asthma. She has been more SOB - she has been on/off Dulera because she has been depending on samples. She has a sample with 10 puffs left. Using qd instead of bid. She is a difficult hx giver - unsure when the last time she needed pred was. Her breathing is better when she is on the [facility].   ROV 02/08/16 --  Patient follows up today for her history of asthma and former tobacco use. She was seen in January for an acute exacerbation in the setting of an upper respiratory infection. She was treated with prednisone and azithromycin. At that time she also wanted to change from [facility] to an alternative. She was started on Symbicort and feels that she is tolerating it well. She can tell that the symbicort is helping. She has cataracts, is concerned that ICS may be contributing to her vision. Uses albuterol about few times a week. Minimal dyspnea, no wheeze. She does state that she has intermittent breakthru reflux that seems to exacerbate UA irritation and also her asthma.   Review of Systems Per HPI  Past Medical History:  Diagnosis Date  . Allergy   . Arthritis   . Asthma   . Asthma   . Cataract   . Depression   . Diverticulosis 10-2011   Colonoscopy  . GERD (gastroesophageal reflux disease)   . H/O seasonal allergies   . Heart murmur   . Hemorrhoids 10-2011   Colonoscopy  . Hemorrhoids, internal   . Hx of colonic polyp   . Hyperlipemia   . Hypertension   . 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   EGD  . TIA (transient ischemic attack)      Family History  Problem Relation Age of Onset  . Colon cancer Sister 54  . Stomach cancer Neg Hx   . Breast cancer      Niece  . Irritable bowel syndrome Sister      Social History   Social History  . Marital status: Widowed    Spouse name: N/A  . Number of children: 1  .  Years of education: college   Occupational History  . Customer Service   .  [employer]   Social History Main Topics  . Smoking status: Former Smoker    Packs/day: 0.50    Types: Cigarettes    Quit date: 09/28/1959  . Smokeless tobacco: Never Used  . Alcohol use No  . Drug use: No  . Sexual activity: Not on file   Other Topics Concern  . Not on file   Social History Narrative   Patient is widowed with one child.   Patient is right handed.   Patient has college education.   Patient does not drink caffeine.     Allergies  Allergen Reactions  . Cepacol Shortness Of Breath    Difficulty breathing  . Lactose Intolerance (Gi) Shortness Of Breath    All dairy  Causes shortness of breath  . Other Hives, Shortness Of Breath and Swelling    Mushrooms (All mushrooms)  . Penicillins Shortness Of Breath and Swelling    Tongue and eyes swell Has patient had a PCN reaction causing immediYES Has patient had a PCN reaction causing severe rash involving mucus membranes or skin necrosis: NO Has patient had a PCN reaction that required  hospitalization NO Has patient had a PCN reaction occurring within the last 10 years:NO If all of the above answers are "NO", then may proceed with Cephalosporin use.     Outpatient Medications Prior to Visit  Medication Sig Dispense Refill  . albuterol (PROVENTIL HFA;VENTOLIN HFA) 108 (90 Base) MCG/ACT inhaler Inhale 2 puffs into the lungs every 6 (six) hours as needed for wheezing or shortness of breath.    . Alpha-D-Galactosidase (BEANO PO) Take 1 tablet by mouth as needed (gas and bloating).     . Ascorbic Acid (VITAMIN C) 1000 MG tablet Take 1,000 mg by mouth daily as needed (Cold).     . budesonide-formoterol (SYMBICORT) 80-4.5 MCG/ACT inhaler Inhale 2 puffs into the lungs 2 (two) times daily. 1 Inhaler 5  . cholecalciferol (VITAMIN D) 1000 units tablet Take 1,000 Units by mouth daily.    . clopidogrel (PLAVIX) 75 MG tablet Take 75 mg by mouth  daily.  1  . cycloSPORINE (RESTASIS) 0.05 % ophthalmic emulsion Place 1 drop into both eyes at bedtime as needed (for dry eyes or irritation). Reported on 07/14/2015    . losartan-hydrochlorothiazide (HYZAAR) 50-12.5 MG per tablet Take 1 tablet by mouth daily.     . meclizine (ANTIVERT) 25 MG tablet Take 1 tablet (25 mg total) by mouth 3 (three) times daily as needed. 20 tablet 0  . Multiple Vitamin (MULTIVITAMIN) tablet Take 1 tablet by mouth daily.    . polyethylene glycol (MIRALAX / GLYCOLAX) packet Take 17 g by mouth 2 (two) times daily as needed for mild constipation.    . Probiotic Product (PROBIOTIC PO) Take 1 capsule by mouth 3 (three) times daily after meals.     . RaNITidine HCl (RANITIDINE 75 PO) Take 1 tablet by mouth 2 (two) times daily after a meal.     . simvastatin (ZOCOR) 20 MG tablet Take 1 tablet (20 mg total) by mouth daily at 6 PM. 90 tablet 3   No facility-administered medications prior to visit.          Objective:   Physical Exam Vitals:   02/08/16 1623  BP: 112/66  Pulse: 65  SpO2: 98%  Weight: 138 lb (62.6 kg)  Height: 5' (1.524 m)   Gen: Pleasant, well-nourished, in no distress,  normal affect  ENT: No lesions,  mouth clear,  oropharynx clear, no postnasal drip  Neck: No JVD, no TMG, no carotid bruits  Lungs: No use of accessory muscles, distant, clear without rales or rhonchi  Cardiovascular: RRR, heart sounds normal, no murmur or gallops, no peripheral edema  Musculoskeletal: No deformities, no cyanosis or clubbing  Neuro: alert, non focal  Skin: Warm, no lesions or rashes      Assessment & Plan:  Intrinsic asthma Mild persistent asthma that appears to be for the most part stable but with influences from her GERD which does give her breakthrough symptoms from time to time. She is currently on ranitidine for this. She liked the change from [facility] to Symbicort. I reassured her that I don't believe her systemic corticosteroid dose with the  Symbicort is high enough to significantly affect her vision. Did ask her to review this with her ophthalmologist.   Please continue your Symbicort 2 puffs twice a day. Rinse and gargle after using.  Take albuterol 2 puffs up to every 4 hours if needed for shortness of breath.  Be sure to get the flu shot this Fall Continue ranitidine as you are taking it.  You might want  to try using over the counter Prilosec to see if helps your reflux Watch your diet to avoid reflux symptoms.  Follow with Dr Lamonte Sakai in 6 months or sooner if you have any problems  GERD Currently on ranitidine with breakthrough symptoms. I asked her to try using omeprazole over-the-counter to see if she benefits. If so then we will look into starting a scheduled PPI   Baltazar Apo, MD, PhD 02/08/2016, 4:48 PM [facility] [phone] or if no answer [phone]

## 2016-02-08 NOTE — Patient Instructions (Addendum)
Please continue your Symbicort 2 puffs twice a day. Rinse and gargle after using.  Take albuterol 2 puffs up to every 4 hours if needed for shortness of breath.  Be sure to get the flu shot this Fall Continue ranitidine as you are taking it.  You might want to try using over the counter Prilosec to see if helps your reflux Watch your diet to avoid reflux symptoms.  Follow with Dr Lamonte Sakai in 6 months or sooner if you have any problems

## 2016-02-08 NOTE — Assessment & Plan Note (Signed)
Currently on ranitidine with breakthrough symptoms. I asked her to try using omeprazole over-the-counter to see if she benefits. If so then we will look into starting a scheduled PPI

## 2016-02-22 ENCOUNTER — Telehealth: Payer: Self-pay | Admitting: Emergency Medicine

## 2016-02-22 MED ORDER — BUDESONIDE-FORMOTEROL FUMARATE 80-4.5 MCG/ACT IN AERO
2.0000 | INHALATION_SPRAY | Freq: Two times a day (BID) | RESPIRATORY_TRACT | 5 refills | Status: DC
Start: 2016-02-22 — End: 2017-09-11

## 2016-02-22 NOTE — Telephone Encounter (Signed)
Rx sent to pharmacy. Patient aware. Nothing further needed.  

## 2016-08-11 DIAGNOSIS — J029 Acute pharyngitis, unspecified: Secondary | ICD-10-CM | POA: Diagnosis not present

## 2016-08-11 DIAGNOSIS — K219 Gastro-esophageal reflux disease without esophagitis: Secondary | ICD-10-CM | POA: Diagnosis not present

## 2016-09-14 ENCOUNTER — Encounter: Payer: Self-pay | Admitting: Gastroenterology

## 2016-09-22 DIAGNOSIS — Z1231 Encounter for screening mammogram for malignant neoplasm of breast: Secondary | ICD-10-CM | POA: Diagnosis not present

## 2016-09-26 DIAGNOSIS — J4530 Mild persistent asthma, uncomplicated: Secondary | ICD-10-CM | POA: Diagnosis not present

## 2016-09-26 DIAGNOSIS — J4520 Mild intermittent asthma, uncomplicated: Secondary | ICD-10-CM | POA: Diagnosis not present

## 2016-09-26 DIAGNOSIS — Z79899 Other long term (current) drug therapy: Secondary | ICD-10-CM | POA: Diagnosis not present

## 2016-09-26 DIAGNOSIS — J45909 Unspecified asthma, uncomplicated: Secondary | ICD-10-CM | POA: Diagnosis not present

## 2016-12-05 DIAGNOSIS — M5442 Lumbago with sciatica, left side: Secondary | ICD-10-CM | POA: Diagnosis not present

## 2016-12-05 DIAGNOSIS — M545 Low back pain: Secondary | ICD-10-CM | POA: Diagnosis not present

## 2016-12-06 ENCOUNTER — Other Ambulatory Visit: Payer: Self-pay | Admitting: Family Medicine

## 2016-12-06 DIAGNOSIS — M5416 Radiculopathy, lumbar region: Secondary | ICD-10-CM

## 2016-12-08 ENCOUNTER — Encounter: Payer: Self-pay | Admitting: Gastroenterology

## 2016-12-08 DIAGNOSIS — I10 Essential (primary) hypertension: Secondary | ICD-10-CM | POA: Diagnosis not present

## 2016-12-08 DIAGNOSIS — Z1389 Encounter for screening for other disorder: Secondary | ICD-10-CM | POA: Diagnosis not present

## 2016-12-08 DIAGNOSIS — Z0000 Encounter for general adult medical examination without abnormal findings: Secondary | ICD-10-CM | POA: Diagnosis not present

## 2016-12-08 DIAGNOSIS — Z79899 Other long term (current) drug therapy: Secondary | ICD-10-CM | POA: Diagnosis not present

## 2016-12-08 DIAGNOSIS — E7800 Pure hypercholesterolemia, unspecified: Secondary | ICD-10-CM | POA: Diagnosis not present

## 2016-12-08 DIAGNOSIS — R5383 Other fatigue: Secondary | ICD-10-CM | POA: Diagnosis not present

## 2016-12-08 DIAGNOSIS — M8588 Other specified disorders of bone density and structure, other site: Secondary | ICD-10-CM | POA: Diagnosis not present

## 2016-12-08 DIAGNOSIS — Z91030 Bee allergy status: Secondary | ICD-10-CM | POA: Diagnosis not present

## 2016-12-08 DIAGNOSIS — E559 Vitamin D deficiency, unspecified: Secondary | ICD-10-CM | POA: Diagnosis not present

## 2016-12-08 DIAGNOSIS — K219 Gastro-esophageal reflux disease without esophagitis: Secondary | ICD-10-CM | POA: Diagnosis not present

## 2016-12-08 DIAGNOSIS — J45909 Unspecified asthma, uncomplicated: Secondary | ICD-10-CM | POA: Diagnosis not present

## 2016-12-13 DIAGNOSIS — M545 Low back pain: Secondary | ICD-10-CM | POA: Diagnosis not present

## 2016-12-13 DIAGNOSIS — M5416 Radiculopathy, lumbar region: Secondary | ICD-10-CM | POA: Diagnosis not present

## 2016-12-18 DIAGNOSIS — M545 Low back pain: Secondary | ICD-10-CM | POA: Diagnosis not present

## 2016-12-18 DIAGNOSIS — M5416 Radiculopathy, lumbar region: Secondary | ICD-10-CM | POA: Diagnosis not present

## 2016-12-22 DIAGNOSIS — M545 Low back pain: Secondary | ICD-10-CM | POA: Diagnosis not present

## 2016-12-22 DIAGNOSIS — M5416 Radiculopathy, lumbar region: Secondary | ICD-10-CM | POA: Diagnosis not present

## 2016-12-25 DIAGNOSIS — M545 Low back pain: Secondary | ICD-10-CM | POA: Diagnosis not present

## 2016-12-25 DIAGNOSIS — M5416 Radiculopathy, lumbar region: Secondary | ICD-10-CM | POA: Diagnosis not present

## 2016-12-26 ENCOUNTER — Other Ambulatory Visit: Payer: Commercial Managed Care - HMO

## 2016-12-26 ENCOUNTER — Ambulatory Visit
Admission: RE | Admit: 2016-12-26 | Discharge: 2016-12-26 | Disposition: A | Discharge status: Home / Self Care | Payer: PRIVATE HEALTH INSURANCE | Source: Ambulatory Visit | Attending: Family Medicine | Admitting: Family Medicine

## 2016-12-26 DIAGNOSIS — M5416 Radiculopathy, lumbar region: Secondary | ICD-10-CM

## 2016-12-28 DIAGNOSIS — M5416 Radiculopathy, lumbar region: Secondary | ICD-10-CM | POA: Diagnosis not present

## 2016-12-28 DIAGNOSIS — M545 Low back pain: Secondary | ICD-10-CM | POA: Diagnosis not present

## 2017-01-04 DIAGNOSIS — M5416 Radiculopathy, lumbar region: Secondary | ICD-10-CM | POA: Diagnosis not present

## 2017-01-04 DIAGNOSIS — M545 Low back pain: Secondary | ICD-10-CM | POA: Diagnosis not present

## 2017-01-10 DIAGNOSIS — M5416 Radiculopathy, lumbar region: Secondary | ICD-10-CM | POA: Diagnosis not present

## 2017-01-10 DIAGNOSIS — M545 Low back pain: Secondary | ICD-10-CM | POA: Diagnosis not present

## 2017-01-11 ENCOUNTER — Encounter: Payer: Self-pay | Admitting: *Deleted

## 2017-01-15 DIAGNOSIS — M5416 Radiculopathy, lumbar region: Secondary | ICD-10-CM | POA: Diagnosis not present

## 2017-01-15 DIAGNOSIS — M545 Low back pain: Secondary | ICD-10-CM | POA: Diagnosis not present

## 2017-01-22 DIAGNOSIS — M5416 Radiculopathy, lumbar region: Secondary | ICD-10-CM | POA: Diagnosis not present

## 2017-01-22 DIAGNOSIS — M545 Low back pain: Secondary | ICD-10-CM | POA: Diagnosis not present

## 2017-01-23 ENCOUNTER — Ambulatory Visit
Admission: RE | Admit: 2017-01-23 | Discharge: 2017-01-23 | Disposition: A | Discharge status: Home / Self Care | Payer: PRIVATE HEALTH INSURANCE | Source: Ambulatory Visit | Attending: Family Medicine | Admitting: Family Medicine

## 2017-01-23 DIAGNOSIS — M5126 Other intervertebral disc displacement, lumbar region: Secondary | ICD-10-CM | POA: Diagnosis not present

## 2017-01-25 ENCOUNTER — Ambulatory Visit: Payer: Commercial Managed Care - HMO | Admitting: Gastroenterology

## 2017-01-26 DIAGNOSIS — M545 Low back pain: Secondary | ICD-10-CM | POA: Diagnosis not present

## 2017-01-26 DIAGNOSIS — M5416 Radiculopathy, lumbar region: Secondary | ICD-10-CM | POA: Diagnosis not present

## 2017-02-05 DIAGNOSIS — M48062 Spinal stenosis, lumbar region with neurogenic claudication: Secondary | ICD-10-CM | POA: Diagnosis not present

## 2017-02-12 DIAGNOSIS — M545 Low back pain: Secondary | ICD-10-CM | POA: Diagnosis not present

## 2017-02-12 DIAGNOSIS — M5416 Radiculopathy, lumbar region: Secondary | ICD-10-CM | POA: Diagnosis not present

## 2017-02-14 DIAGNOSIS — M48061 Spinal stenosis, lumbar region without neurogenic claudication: Secondary | ICD-10-CM | POA: Diagnosis not present

## 2017-02-16 DIAGNOSIS — M5416 Radiculopathy, lumbar region: Secondary | ICD-10-CM | POA: Diagnosis not present

## 2017-02-16 DIAGNOSIS — M545 Low back pain: Secondary | ICD-10-CM | POA: Diagnosis not present

## 2017-02-20 DIAGNOSIS — M545 Low back pain: Secondary | ICD-10-CM | POA: Diagnosis not present

## 2017-02-20 DIAGNOSIS — M5416 Radiculopathy, lumbar region: Secondary | ICD-10-CM | POA: Diagnosis not present

## 2017-02-23 DIAGNOSIS — M545 Low back pain: Secondary | ICD-10-CM | POA: Diagnosis not present

## 2017-02-23 DIAGNOSIS — M5416 Radiculopathy, lumbar region: Secondary | ICD-10-CM | POA: Diagnosis not present

## 2017-02-27 DIAGNOSIS — M545 Low back pain: Secondary | ICD-10-CM | POA: Diagnosis not present

## 2017-02-27 DIAGNOSIS — M5416 Radiculopathy, lumbar region: Secondary | ICD-10-CM | POA: Diagnosis not present

## 2017-03-06 DIAGNOSIS — M545 Low back pain: Secondary | ICD-10-CM | POA: Diagnosis not present

## 2017-03-06 DIAGNOSIS — M5416 Radiculopathy, lumbar region: Secondary | ICD-10-CM | POA: Diagnosis not present

## 2017-03-13 DIAGNOSIS — M5416 Radiculopathy, lumbar region: Secondary | ICD-10-CM | POA: Diagnosis not present

## 2017-03-13 DIAGNOSIS — M545 Low back pain: Secondary | ICD-10-CM | POA: Diagnosis not present

## 2017-03-14 ENCOUNTER — Encounter: Payer: Self-pay | Admitting: *Deleted

## 2017-03-20 DIAGNOSIS — Z111 Encounter for screening for respiratory tuberculosis: Secondary | ICD-10-CM | POA: Diagnosis not present

## 2017-03-21 ENCOUNTER — Ambulatory Visit (INDEPENDENT_AMBULATORY_CARE_PROVIDER_SITE_OTHER): Payer: Self-pay | Admitting: Gastroenterology

## 2017-03-21 ENCOUNTER — Encounter (INDEPENDENT_AMBULATORY_CARE_PROVIDER_SITE_OTHER): Payer: Self-pay

## 2017-03-21 ENCOUNTER — Encounter: Payer: Self-pay | Admitting: Gastroenterology

## 2017-03-21 VITALS — BP 108/62 | HR 72 | Ht 59.45 in | Wt 143.2 lb

## 2017-03-21 DIAGNOSIS — R1013 Epigastric pain: Secondary | ICD-10-CM

## 2017-03-21 DIAGNOSIS — Z800 Family history of malignant neoplasm of digestive organs: Secondary | ICD-10-CM

## 2017-03-21 DIAGNOSIS — K219 Gastro-esophageal reflux disease without esophagitis: Secondary | ICD-10-CM

## 2017-03-21 DIAGNOSIS — R12 Heartburn: Secondary | ICD-10-CM

## 2017-03-21 NOTE — Patient Instructions (Signed)
It has been recommended to you by your physician that you have a(n) Colonoscopy/Endoscopy completed. Per your request, we did not schedule the procedure(s) today. Please contact our office at [phone] should you decide to have the procedure completed.   We will get the ok for you to hold your Plavix before your procedure   We will send omeprazole 40 mg daily before breakfast

## 2017-03-23 NOTE — Progress Notes (Signed)
[name]    [id number]    1935/06/23  Primary Care Physician:Barnes, Benjamine Mola, MD  Referring Physician: Leighton Ruff, [place],  [id number]  Chief complaint:  GERD, gas, bloating  HPI:  81 year old African-American female previously followed by Dr. Deatra Ina, last seen in office 12/13/2011 here to establish care. Patient complains of intermittent heartburn and epigastric discomfort associated with excessive gas and bloating. She is currently not on any PPI or H2 blocker given concern for potential side effects. She is taking over-the-counter gas relief medication. Patient is extremely anxious, feels anxiety makes her symptoms worse.  Last EGD 11/16/2011: Multiple gastric polyps, gastroesophageal junction stricture dilated with 18 mm Maloney dilator with minimal resistance and no heme. Colonoscopy 11/16/2011: Mild sigmoid diverticulosis and internal hemorrhoids otherwise negative exam Other relevant GI history includes family history of colon cancer and sibling, transient ischemic attack, hyperlipidemia on Plavix, asthma   Outpatient Encounter Prescriptions as of 03/21/2017  Medication Sig  . albuterol (PROVENTIL HFA;VENTOLIN HFA) 108 (90 Base) MCG/ACT inhaler Inhale 2 puffs into the lungs every 6 (six) hours as needed for wheezing or shortness of breath.  . Alpha-D-Galactosidase (BEANO PO) Take 1 tablet by mouth as needed (gas and bloating).   . Ascorbic Acid (VITAMIN C) 1000 MG tablet Take 1,000 mg by mouth daily as needed (Cold).   . budesonide-formoterol (SYMBICORT) 80-4.5 MCG/ACT inhaler Inhale 2 puffs into the lungs 2 (two) times daily.  . cholecalciferol (VITAMIN D) 1000 units tablet Take 1,000 Units by mouth daily.  . clopidogrel (PLAVIX) 75 MG tablet Take 75 mg by mouth daily.  . cycloSPORINE (RESTASIS) 0.05 % ophthalmic emulsion Place 1 drop into both eyes at bedtime as needed (for dry eyes or irritation). Reported on 07/14/2015    . losartan-hydrochlorothiazide (HYZAAR) 50-12.5 MG per tablet Take 1 tablet by mouth daily.   . meclizine (ANTIVERT) 25 MG tablet Take 1 tablet (25 mg total) by mouth 3 (three) times daily as needed.  . Multiple Vitamin (MULTIVITAMIN) tablet Take 1 tablet by mouth daily.  . polyethylene glycol (MIRALAX / GLYCOLAX) packet Take 17 g by mouth 2 (two) times daily as needed for mild constipation.  . Probiotic Product (PROBIOTIC PO) Take 1 capsule by mouth 3 (three) times daily after meals.   . RaNITidine HCl (RANITIDINE 75 PO) Take 1 tablet by mouth 2 (two) times daily after a meal.   . simvastatin (ZOCOR) 20 MG tablet Take 1 tablet (20 mg total) by mouth daily at 6 PM.   No facility-administered encounter medications on file as of 03/21/2017.     Allergies as of 03/21/2017 - Review Complete 03/21/2017  Allergen Reaction Noted  . Cepacol Shortness Of Breath 09/28/2011  . Lactose intolerance (gi) Shortness Of Breath 12/29/2012  . Other Hives, Shortness Of Breath, and Swelling 10/16/2014  . Penicillins Shortness Of Breath and Swelling     Past Medical History:  Diagnosis Date  . Allergy   . Arthritis   . Asthma   . Asthma   . Blood clotting disorder ([place])   . Cataract   . COPD (chronic obstructive pulmonary disease) ([place])   . Depression   . Diverticulosis 10-2011   Colonoscopy  . GERD (gastroesophageal reflux disease)   . H/O seasonal allergies   . Heart murmur   . Hemorrhoids 10-2011   Colonoscopy  . Hemorrhoids, internal   . Hx of colonic polyp   . Hyperlipemia   .  Hypertension   . IBS (irritable bowel syndrome)   . 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   EGD  . TIA (transient ischemic attack)     Past Surgical History:  Procedure Laterality Date  . [place]  . LAPAROSCOPIC CHOLECYSTECTOMY  1995    Family History  Problem Relation Age of Onset  . Heart disease Mother   . Hypertension Mother   . Diabetes Mother   . Asthma Father   . Colon cancer Sister 2  .  Irritable bowel syndrome Sister   . Breast cancer Unknown        Niece  . Stomach cancer Neg Hx     Social History   Social History  . Marital status: Widowed    Spouse name: N/A  . Number of children: 1  . Years of education: college   Occupational History  . retired    Social History Main Topics  . Smoking status: Former Smoker    Packs/day: 0.50    Types: Cigarettes    Quit date: 09/28/1959  . Smokeless tobacco: Never Used  . Alcohol use No  . Drug use: No  . Sexual activity: Not on file   Other Topics Concern  . Not on file   Social History Narrative   Patient is widowed with one child.   Patient is right handed.   Patient has college education.   Patient does not drink caffeine.      Review of systems: Review of Systems  Constitutional: Negative for fever and chills.  HENT: Negative.   Eyes: Negative for blurred vision.  Respiratory: Negative for cough, shortness of breath and wheezing.   Cardiovascular: Negative for chest pain and palpitations.  Gastrointestinal: as per HPI Genitourinary: Negative for dysuria, urgency, frequency and hematuria.  Musculoskeletal: Negative for myalgias, back pain and joint pain.  Skin: Negative for itching and rash.  Neurological: Negative for dizziness, tremors, focal weakness, seizures and loss of consciousness.  Endo/Heme/Allergies: Positive for seasonal allergies.  Psychiatric/Behavioral: Negative for depression, suicidal ideas and hallucinations.  All other systems reviewed and are negative.   Physical Exam: Vitals:   03/21/17 1050  BP: 108/62  Pulse: 72   Body mass index is 28.5 kg/m. Gen:      No acute distress HEENT:  EOMI, sclera anicteric Neck:     No masses; no thyromegaly Lungs:    Clear to auscultation bilaterally; normal respiratory effort CV:         Regular rate and rhythm; no murmurs Abd:      + bowel sounds; soft, non-tender; no palpable masses, no distension Ext:    No edema; adequate  peripheral perfusion Skin:      Warm and dry; no rash Neuro: alert and oriented x 3 Psych: normal mood and affect  Data Reviewed:  Reviewed labs, radiology imaging, old records and pertinent past GI work up   Assessment and Plan/Recommendations:  81 year old female with history of GERD, family history of colon cancer, hyperlipidemia, TIA on Plavix Patient is past due for colorectal cancer screening Discussed with patient regarding scheduling colonoscopy and she wants to proceed with that We will discuss with PMD if okay to hold Plavix 5 days prior to the procedure Patient said when ever she holds Plavix she gets severe cramps in her legs and her whole body and she was concerned if she will be able to tolerate going without Plavix for 5 days prior to the procedure  GERD: Patient extremely reluctant to try either  H2 blocker or a PPI Discussed antireflux measures and lifestyle modifications in detail We'll send a prescription for omeprazole 40 mg daily, 30 minutes before breakfast if patient is interested in giving it a try   Return as needed   K. Denzil Magnuson , MD [phone] Mon-Fri 8a-5p [phone] after 5p, weekends, holidays  CC: Leighton Ruff, MD

## 2017-03-26 ENCOUNTER — Telehealth: Payer: Self-pay | Admitting: *Deleted

## 2017-03-26 NOTE — Telephone Encounter (Signed)
  03/26/2017   RE: [name] DOB: 1936/04/03 MRN: [id number]   Dear Dr Radford Pax,   We have scheduled the above patient for an endoscopic procedure. Our records show that she is on anticoagulation therapy.   Please advise as to how long the patient may come off her therapy of Plavix prior to the procedure, which is scheduled for patient has not scheduled yet.  Please fax back/ or route the completed form to [facility] at [phone].   Sincerely,    Genella Mech ,CMA AAMA

## 2017-03-27 DIAGNOSIS — H25013 Cortical age-related cataract, bilateral: Secondary | ICD-10-CM | POA: Diagnosis not present

## 2017-03-27 DIAGNOSIS — H25043 Posterior subcapsular polar age-related cataract, bilateral: Secondary | ICD-10-CM | POA: Diagnosis not present

## 2017-03-27 DIAGNOSIS — H18413 Arcus senilis, bilateral: Secondary | ICD-10-CM | POA: Diagnosis not present

## 2017-03-27 DIAGNOSIS — H2513 Age-related nuclear cataract, bilateral: Secondary | ICD-10-CM | POA: Diagnosis not present

## 2017-03-27 NOTE — Telephone Encounter (Signed)
This needs to go to PCP - I did not prescribe her plavix - she is on it for TIA

## 2017-04-02 NOTE — Telephone Encounter (Signed)
Will forward this to PCP

## 2017-04-13 ENCOUNTER — Other Ambulatory Visit: Payer: Self-pay

## 2017-04-13 ENCOUNTER — Telehealth: Payer: Self-pay | Admitting: Gastroenterology

## 2017-04-13 MED ORDER — ONDANSETRON HCL 4 MG PO TABS: 4.0000 mg | ORAL_TABLET | Freq: Three times a day (TID) | ORAL | 0 refills | Status: DC | PRN

## 2017-04-13 NOTE — Telephone Encounter (Signed)
Patient states she cannot find her medication at home and is asking a prescription be sent to her pharmacy.

## 2017-04-13 NOTE — Telephone Encounter (Signed)
Patient is taking Omeprazole 40 mg Every morning for GERD. She took a cold medication that caused her to have an increase of reflux and "gas". She feel nauseous. This is day 3. She states some improvement. She is eating bland starchy foods. She would like something for nausea as she feels this will help. She is not vomiting. No fever or diarrhea. Spoke with Dr Havery Moros. [place] for Zofran.

## 2017-04-17 NOTE — Telephone Encounter (Signed)
Patient has not scheduled this procedure at this time

## 2017-04-24 ENCOUNTER — Telehealth: Payer: Self-pay | Admitting: Gastroenterology

## 2017-04-24 NOTE — Telephone Encounter (Signed)
The patient is still having GI issues with her stomach. She does not feel she can "handle a prep" at this time. She would like to go forward with the EGD to determine the cause of her nausea, gas and night time reflux. She is on Plavix. Please advise. Thank you.

## 2017-04-25 NOTE — Telephone Encounter (Signed)
Ok to proceed with EGD on plavix. Patient has family history of colon cancer and she is due for screening colonoscopy, she is not a candidate for cologaurd.

## 2017-04-25 NOTE — Telephone Encounter (Signed)
Left message for pt to call back  °

## 2017-06-01 NOTE — Telephone Encounter (Signed)
Left a message to call 

## 2017-07-13 DIAGNOSIS — R0602 Shortness of breath: Secondary | ICD-10-CM | POA: Diagnosis not present

## 2017-07-13 DIAGNOSIS — J209 Acute bronchitis, unspecified: Secondary | ICD-10-CM | POA: Diagnosis not present

## 2017-07-16 DIAGNOSIS — I10 Essential (primary) hypertension: Secondary | ICD-10-CM | POA: Diagnosis not present

## 2017-07-16 DIAGNOSIS — R040 Epistaxis: Secondary | ICD-10-CM | POA: Diagnosis not present

## 2017-07-16 DIAGNOSIS — J209 Acute bronchitis, unspecified: Secondary | ICD-10-CM | POA: Diagnosis not present

## 2017-07-18 ENCOUNTER — Ambulatory Visit: Payer: PPO | Admitting: Emergency Medicine

## 2017-07-18 ENCOUNTER — Encounter: Payer: Self-pay | Admitting: Emergency Medicine

## 2017-07-18 DIAGNOSIS — J45901 Unspecified asthma with (acute) exacerbation: Secondary | ICD-10-CM | POA: Diagnosis not present

## 2017-07-18 MED ORDER — ALBUTEROL SULFATE (2.5 MG/3ML) 0.083% IN NEBU
2.5000 mg | INHALATION_SOLUTION | Freq: Once | RESPIRATORY_TRACT | Status: AC
  Administered 2017-07-18: 2.5 mg via RESPIRATORY_TRACT

## 2017-07-18 MED ORDER — PREDNISONE 10 MG PO TABS: ORAL_TABLET | ORAL | 0 refills | Status: DC

## 2017-07-18 MED ORDER — ALBUTEROL SULFATE (2.5 MG/3ML) 0.083% IN NEBU: 2.5000 mg | INHALATION_SOLUTION | RESPIRATORY_TRACT | 5 refills | Status: DC | PRN

## 2017-07-18 NOTE — Assessment & Plan Note (Signed)
Apparently triggered by an upper respiratory infection.  She was treated already for possible bronchitis.  She continues to have wheeze, dyspnea, evidence for bronchospasm.  He is wheezing on exam today.  Treat her with a prednisone taper follow for resolution.  Continue Symbicort twice a day, albuterol as needed.

## 2017-07-18 NOTE — Progress Notes (Signed)
Subjective:    Patient ID: [name], female    DOB: 05/23/1936, 82 y.o.   MRN: [id number]  Asthma  Her past medical history is significant for asthma.   82 year old woman, former remote smoker, followed by Dr Gwenette Greet for asthma. She has been more SOB - she has been on/off Dulera because she has been depending on samples. She has a sample with 10 puffs left. Using qd instead of bid. She is a difficult hx giver - unsure when the last time she needed pred was. Her breathing is better when she is on the [facility].   ROV 02/08/16 --  Patient follows up today for her history of asthma and former tobacco use. She was seen in January for an acute exacerbation in the setting of an upper respiratory infection. She was treated with prednisone and azithromycin. At that time she also wanted to change from [facility] to an alternative. She was started on Symbicort and feels that she is tolerating it well. She can tell that the symbicort is helping. She has cataracts, is concerned that ICS may be contributing to her vision. Uses albuterol about few times a week. Minimal dyspnea, no wheeze. She does state that she has intermittent breakthru reflux that seems to exacerbate UA irritation and also her asthma.   ROV 07/18/17 --patient is an 82 year old woman with a history of former tobacco use, COPD plus asthma.  Last seen here 01/2016.  She tells me that she has been doing . She uses symbicort reliably, albuterol usually about once a day but more recently. She had a URI just over a week ago - was treated with doxy for a bronchitis. She is having difficulty breathing on exertion, at night when she lays down to sleep. She is hearing some wheeze.   She has been having dry eyes, is under eval for possible cataracts.    Review of Systems Per HPI  Past Medical History:  Diagnosis Date  . Allergy   . Arthritis   . Asthma   . Asthma   . Blood clotting disorder ([place])   . Cataract   . COPD (chronic obstructive  pulmonary disease) ([place])   . Depression   . Diverticulosis 10-2011   Colonoscopy  . GERD (gastroesophageal reflux disease)   . H/O seasonal allergies   . Heart murmur   . Hemorrhoids 10-2011   Colonoscopy  . Hemorrhoids, internal   . Hx of colonic polyp   . Hyperlipemia   . Hypertension   . IBS (irritable bowel syndrome)   . 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   EGD  . TIA (transient ischemic attack)      Family History  Problem Relation Age of Onset  . Heart disease Mother   . Hypertension Mother   . Diabetes Mother   . Asthma Father   . Colon cancer Sister 40  . Irritable bowel syndrome Sister   . Breast cancer Unknown        Niece  . Stomach cancer Neg Hx      Social History   Socioeconomic History  . Marital status: Widowed    Spouse name: Not on file  . Number of children: 1  . Years of education: college  . Highest education level: Not on file  Social Needs  . Financial resource strain: Not on file  . Food insecurity - worry: Not on file  . Food insecurity - inability: Not on file  . Transportation needs - medical: Not on file  .  Transportation needs - non-medical: Not on file  Occupational History  . Occupation: retired  Tobacco Use  . Smoking status: Former Smoker    Packs/day: 0.50    Types: Cigarettes    Last attempt to quit: 09/28/1959    Years since quitting: 57.8  . Smokeless tobacco: Never Used  Substance and Sexual Activity  . Alcohol use: No    Alcohol/week: 0.0 oz  . Drug use: No  . Sexual activity: Not on file  Other Topics Concern  . Not on file  Social History Narrative   Patient is widowed with one child.   Patient is right handed.   Patient has college education.   Patient does not drink caffeine.     Allergies  Allergen Reactions  . Cepacol Shortness Of Breath    Difficulty breathing  . Lactose Intolerance (Gi) Shortness Of Breath    All dairy  Causes shortness of breath  . Other Hives, Shortness Of Breath and Swelling     Mushrooms (All mushrooms)  . Penicillins Shortness Of Breath and Swelling    Tongue and eyes swell Has patient had a PCN reaction causing immediYES Has patient had a PCN reaction causing severe rash involving mucus membranes or skin necrosis: NO Has patient had a PCN reaction that required hospitalization NO Has patient had a PCN reaction occurring within the last 10 years:NO If all of the above answers are "NO", then may proceed with Cephalosporin use.     Outpatient Medications Prior to Visit  Medication Sig Dispense Refill  . albuterol (PROVENTIL HFA;VENTOLIN HFA) 108 (90 Base) MCG/ACT inhaler Inhale 2 puffs into the lungs every 6 (six) hours as needed for wheezing or shortness of breath.    . Alpha-D-Galactosidase (BEANO PO) Take 1 tablet by mouth as needed (gas and bloating).     . Ascorbic Acid (VITAMIN C) 1000 MG tablet Take 1,000 mg by mouth daily as needed (Cold).     . budesonide-formoterol (SYMBICORT) 80-4.5 MCG/ACT inhaler Inhale 2 puffs into the lungs 2 (two) times daily. 1 Inhaler 5  . cholecalciferol (VITAMIN D) 1000 units tablet Take 1,000 Units by mouth daily.    . clopidogrel (PLAVIX) 75 MG tablet Take 75 mg by mouth daily.  1  . cycloSPORINE (RESTASIS) 0.05 % ophthalmic emulsion Place 1 drop into both eyes at bedtime as needed (for dry eyes or irritation). Reported on 07/14/2015    . losartan-hydrochlorothiazide (HYZAAR) 50-12.5 MG per tablet Take 1 tablet by mouth daily.     . meclizine (ANTIVERT) 25 MG tablet Take 1 tablet (25 mg total) by mouth 3 (three) times daily as needed. 20 tablet 0  . Multiple Vitamin (MULTIVITAMIN) tablet Take 1 tablet by mouth daily.    . ondansetron (ZOFRAN) 4 MG tablet Take 1 tablet (4 mg total) by mouth every 8 (eight) hours as needed for nausea or vomiting. 15 tablet 0  . polyethylene glycol (MIRALAX / GLYCOLAX) packet Take 17 g by mouth 2 (two) times daily as needed for mild constipation.    . Probiotic Product (PROBIOTIC PO) Take 1 capsule  by mouth 3 (three) times daily after meals.     . simvastatin (ZOCOR) 20 MG tablet Take 1 tablet (20 mg total) by mouth daily at 6 PM. 90 tablet 3  . RaNITidine HCl (RANITIDINE 75 PO) Take 1 tablet by mouth 2 (two) times daily after a meal.      No facility-administered medications prior to visit.  Objective:   Physical Exam Vitals:   07/18/17 1358  BP: 140/84  Pulse: 81  SpO2: 99%  Weight: 140 lb (63.5 kg)  Height: 5' (1.524 m)   Gen: Pleasant, elderly woman, in no distress,  normal affect  ENT: No lesions,  mouth clear,  oropharynx clear, no postnasal drip  Neck: No JVD, no stridor  Lungs: No use of accessory muscles, distant, bilateral end expiratory wheezes  Cardiovascular: RRR, heart sounds normal, no murmur or gallops, no peripheral edema  Musculoskeletal: No deformities, no cyanosis or clubbing  Neuro: alert, non focal  Skin: Warm, no lesions or rashes      Assessment & Plan:  Asthma with acute exacerbation Apparently triggered by an upper respiratory infection.  She was treated already for possible bronchitis.  She continues to have wheeze, dyspnea, evidence for bronchospasm.  He is wheezing on exam today.  Treat her with a prednisone taper follow for resolution.  Continue Symbicort twice a day, albuterol as needed.   Baltazar Apo, MD, PhD 07/18/2017, 2:10 PM [facility] [phone] or if no answer [phone]

## 2017-07-18 NOTE — Patient Instructions (Addendum)
Albuterol nebulizer treatment now Continue your Symbicort twice a day Use albuterol 2 puffs if needed for shortness of breath, wheeze, chest tightness.  Take prednisone as directed until completely gone.  Call our office next week to let us know if you are improving.  Follow with Dr Lamonte Sakai in 4 months or sooner if you have any problems.

## 2017-07-19 DIAGNOSIS — J45901 Unspecified asthma with (acute) exacerbation: Secondary | ICD-10-CM | POA: Diagnosis not present

## 2017-07-23 ENCOUNTER — Telehealth: Payer: Self-pay | Admitting: Emergency Medicine

## 2017-07-23 NOTE — Telephone Encounter (Signed)
Called and spoke with pt. Pt states she was instructed by RB to call back with update.  Pt states she is feeling 90% better with prednisone taper. Pt states she has 3 days left of prednisone. Pt is aware that RB is out of office until tomorrow. Pt is okay with waiting until RB returns to office.   Will route to RB to make aware.

## 2017-07-24 NOTE — Telephone Encounter (Signed)
Thank you :)

## 2017-08-01 ENCOUNTER — Telehealth: Payer: Self-pay | Admitting: Emergency Medicine

## 2017-08-01 NOTE — Telephone Encounter (Signed)
Called and spoke with [employer] advised that we have not received the paper work. They are sending again, will await to see if we get it if not will call back.

## 2017-08-16 DIAGNOSIS — J45901 Unspecified asthma with (acute) exacerbation: Secondary | ICD-10-CM | POA: Diagnosis not present

## 2017-08-30 ENCOUNTER — Telehealth: Payer: Self-pay | Admitting: Emergency Medicine

## 2017-08-30 NOTE — Telephone Encounter (Signed)
Unclear the tthis is the cause but I would have her stop the symbicort to see if the sx improve. Then let us know

## 2017-08-30 NOTE — Telephone Encounter (Signed)
Called and spoke with patient, she is aware nothing further needed.  

## 2017-08-30 NOTE — Telephone Encounter (Signed)
I would like to see if the symptoms get better off of Symbicort before starting an alternative. If the symptoms persist off symbicort then ultimately symbicort may be an acceptable medication for her to take (and we will have to look for alternative cause for her problems). As an aside, she has tolerated Symbicort at times in the past - 2017, also 2019, has told me that it helps her  Starting another medication in the meantime may confuse [place] as the cause (since the alternatives are similar to symbicort). If she would like to start an alternative in the meantime anyway, we could do so so. She has been on [facility] before, could try this. Or she could start Breo 100.   We would learn the most about her side effects if she could stop everything for a few days and then report to [place] what her eye do.

## 2017-08-30 NOTE — Telephone Encounter (Signed)
Spoke with pt. She is very unhappy with Dr. Agustina Caroli recommednations. States, "He has no consideration for me and my asthma but just telling me to stop my medication without replacing it with something else." I advised the pt that I do not believe that is what Dr. Lamonte Sakai is doing. She stated, "I don't care much for what you think."  Dr. Lamonte Sakai - please advise on an alternative for Symbicort for this pt. Thanks.

## 2017-08-30 NOTE — Telephone Encounter (Signed)
Called and spoke with patient she states that she has been taking her symbicort 160 and is now having the side effect of eye problems. Her eyes are very dry and painful. She is wanting to know if she needs to continue this medication or do something else. RB please advise on this, thanks.

## 2017-09-11 ENCOUNTER — Telehealth: Payer: Self-pay | Admitting: Emergency Medicine

## 2017-09-11 NOTE — Telephone Encounter (Signed)
Did her dry eyes improve off of the Symbicort? If so then we can probably ascribe that side effect to the Symbicort.   If she would like to try starting an alternative to see if she benefits then have her start Breo 100, once a day. She will need to follow with me or APP in 3-4 weeks to assess how she is doing on the new medication.

## 2017-09-11 NOTE — Telephone Encounter (Signed)
Spoke with pt, she states since she stopped the Symbicort because of side effects from a previous message, causing dry eyes. She is now having soreness when she inhales and her chest feels sore. She is now using the albuterol every 4 hours and she is noticing that the albuterol is not working as much anymore. She is wanting to get an xray because she is wanting to know what is going on with her lungs. There are no available appointment until May for [place]. Please advise if you would like to send something in for pt. She is willing to come in but she is having more SOB now.   [facility] [employer]

## 2017-09-11 NOTE — Telephone Encounter (Signed)
Spoke with pt, verified that her dry eye improved when coming off of Symbicort.  This has been added to allergy list.   Advised pt of RB's recs, but pt is concerned that she needs additional testing on her lungs- did not specify testing being requested.  Pt does not want to try another inhaler without being further evaluated.  Scheduled pt to see SG on 3/28.  Nothing further needed.

## 2017-09-13 ENCOUNTER — Ambulatory Visit: Payer: PPO | Admitting: Acute Care

## 2017-09-13 ENCOUNTER — Ambulatory Visit (INDEPENDENT_AMBULATORY_CARE_PROVIDER_SITE_OTHER)
Admission: RE | Admit: 2017-09-13 | Discharge: 2017-09-13 | Disposition: A | Discharge status: Home / Self Care | Payer: PPO | Source: Ambulatory Visit | Attending: Acute Care | Admitting: Acute Care

## 2017-09-13 ENCOUNTER — Encounter: Payer: Self-pay | Admitting: Acute Care

## 2017-09-13 VITALS — BP 140/75 | HR 88 | Ht 60.0 in | Wt 144.8 lb

## 2017-09-13 DIAGNOSIS — J40 Bronchitis, not specified as acute or chronic: Secondary | ICD-10-CM | POA: Diagnosis not present

## 2017-09-13 DIAGNOSIS — J45909 Unspecified asthma, uncomplicated: Secondary | ICD-10-CM

## 2017-09-13 DIAGNOSIS — R091 Pleurisy: Secondary | ICD-10-CM | POA: Diagnosis not present

## 2017-09-13 NOTE — Assessment & Plan Note (Signed)
Stable interval Has resumed Symbicort Daily Plan: We will schedule PFT's  Continue Symbicort 2 puffs twice daily Pro Air is to be used only as a rescue medication, up to 4 times a day We will give you a GERD diet Follow up in 1 month with Dr. Lamonte Sakai or Judson Roch NP. Please contact office for sooner follow up if symptoms do not improve or worsen or seek emergency care

## 2017-09-13 NOTE — Assessment & Plan Note (Signed)
Bronchitis x 5-6 weeks in January Chest pain with inspiration x 5-6 weeks No fever Plan: We will do a CXR today to evaluate your chest soreness. Take Tylenol 500 mg up to every 8 hours Ibuprofen 400 mg twice a day with food. We will give you a GERD diet Follow up in 1 month with Dr. Lamonte Sakai or Judson Roch NP. Please contact office for sooner follow up if symptoms do not improve or worsen or seek emergency care

## 2017-09-13 NOTE — Progress Notes (Signed)
History of Present Illness [name] is a 82 y.o. female former smoker  with asthma. She is followed by Dr. Lamonte Sakai.  82 year old woman, former remote smoker, followed by Dr Gwenette Greet for asthma.  She is a difficult hx giver .  Maintenance medication>> Symbicort Rescue>> Albuterol  09/13/2017 Acute OV for chest soreness.  Pt. Has had several calls to the office regarding dry eyes.She is trying to get her cataracts removed, but her eye doctor wont do this until her dry eyes resolve. She thought it was snot the Symbicort that was causing the dry eyes. She has been off the Symbicort for a while, and she found that she was unable to manage without it. She was using her rescue inhaler every 2 hours. She has resumed her Symbicort, and she now thinks her dry eyes are due to her GERD medication which is a digestive enzyme. She has been having chest tightness. She knows she needs to take it daily. She is asking about using it just once daily, but I explained the dosing and the rationale for it.She is not coughing. She denies fever. She is aware she has allergies and springtime triggers.She states she has pain with inspiration. She states the pain has been ongoing for a few months. She did have bronchitis in January for about 5-6 weeks. When she holds her breath the pain  goes away.  Test Results: Spirometry 08/2013>> Moderate airway obstruction.   CBC Latest Ref Rng & Units 07/19/2015 10/16/2014 10/16/2014  WBC 4.0 - 10.5 K/uL 7.0 - 3.2(L)  Hemoglobin 12.0 - 15.0 g/dL 13.0 12.9 12.5  Hematocrit 36.0 - 46.0 % 38.4 38.0 37.3  Platelets 150 - 400 K/uL 339 - 290    BMP Latest Ref Rng & Units 07/19/2015 10/16/2014 10/16/2014  Glucose 65 - 99 mg/dL 101(H) 87 86  BUN 6 - 20 mg/dL 16 16 14   Creatinine 0.44 - 1.00 mg/dL 0.93 1.20(H) 1.08  Sodium 135 - 145 mmol/L 141 142 139  Potassium 3.5 - 5.1 mmol/L 3.6 3.8 3.7  Chloride 101 - 111 mmol/L 104 103 105  CO2 22 - 32 mmol/L 27 - 26  Calcium 8.9 - 10.3 mg/dL  9.5 - 9.4    BNP No results found for: BNP  ProBNP No results found for: PROBNP  PFT No results found for: FEV1PRE, FEV1POST, FVCPRE, FVCPOST, TLC, DLCOUNC, PREFEV1FVCRT, PSTFEV1FVCRT  No results found.   Past medical hx Past Medical History:  Diagnosis Date  . Allergy   . Arthritis   . Asthma   . Asthma   . Blood clotting disorder ([place])   . Cataract   . COPD (chronic obstructive pulmonary disease) ([place])   . Depression   . Diverticulosis 10-2011   Colonoscopy  . GERD (gastroesophageal reflux disease)   . H/O seasonal allergies   . Heart murmur   . Hemorrhoids 10-2011   Colonoscopy  . Hemorrhoids, internal   . Hx of colonic polyp   . Hyperlipemia   . Hypertension   . IBS (irritable bowel syndrome)   . 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   EGD  . TIA (transient ischemic attack)      Social History   Tobacco Use  . Smoking status: Former Smoker    Packs/day: 0.50    Types: Cigarettes    Last attempt to quit: 09/28/1959    Years since quitting: 58.0  . Smokeless tobacco: Never Used  Substance Use Topics  . Alcohol use: No    Alcohol/week: 0.0 oz  .  Drug use: No    [name] reports that she quit smoking about 58 years ago. Her smoking use included cigarettes. She smoked 0.50 packs per day. She has never used smokeless tobacco. She reports that she does not drink alcohol or use drugs.  Tobacco Cessation: Former remote smoker  Past surgical hx, Family hx, Social hx all reviewed.  Current Outpatient Medications on File Prior to Visit  Medication Sig  . albuterol (PROVENTIL HFA;VENTOLIN HFA) 108 (90 Base) MCG/ACT inhaler Inhale 2 puffs into the lungs every 6 (six) hours as needed for wheezing or shortness of breath.  Marland Kitchen albuterol (PROVENTIL) (2.5 MG/3ML) 0.083% nebulizer solution Take 3 mLs (2.5 mg total) by nebulization every 4 (four) hours as needed for wheezing or shortness of breath.  . Alpha-D-Galactosidase (BEANO PO) Take 1 tablet by mouth as needed (gas and  bloating).   . Ascorbic Acid (VITAMIN C) 1000 MG tablet Take 1,000 mg by mouth daily as needed (Cold).   . cholecalciferol (VITAMIN D) 1000 units tablet Take 1,000 Units by mouth daily.  . clopidogrel (PLAVIX) 75 MG tablet Take 75 mg by mouth daily.  . cycloSPORINE (RESTASIS) 0.05 % ophthalmic emulsion Place 1 drop into both eyes at bedtime as needed (for dry eyes or irritation). Reported on 07/14/2015  . losartan-hydrochlorothiazide (HYZAAR) 50-12.5 MG per tablet Take 1 tablet by mouth daily.   . meclizine (ANTIVERT) 25 MG tablet Take 1 tablet (25 mg total) by mouth 3 (three) times daily as needed.  . Multiple Vitamin (MULTIVITAMIN) tablet Take 1 tablet by mouth daily.  . ondansetron (ZOFRAN) 4 MG tablet Take 1 tablet (4 mg total) by mouth every 8 (eight) hours as needed for nausea or vomiting.  . polyethylene glycol (MIRALAX / GLYCOLAX) packet Take 17 g by mouth 2 (two) times daily as needed for mild constipation.  . Probiotic Product (PROBIOTIC PO) Take 1 capsule by mouth 3 (three) times daily after meals.   . simvastatin (ZOCOR) 20 MG tablet Take 1 tablet (20 mg total) by mouth daily at 6 PM.   No current facility-administered medications on file prior to visit.      Allergies  Allergen Reactions  . Cepacol Shortness Of Breath    Difficulty breathing  . Lactose Intolerance (Gi) Shortness Of Breath    All dairy  Causes shortness of breath  . Other Hives, Shortness Of Breath and Swelling    Mushrooms (All mushrooms)  . Penicillins Shortness Of Breath and Swelling    Tongue and eyes swell Has patient had a PCN reaction causing immediYES Has patient had a PCN reaction causing severe rash involving mucus membranes or skin necrosis: NO Has patient had a PCN reaction that required hospitalization NO Has patient had a PCN reaction occurring within the last 10 years:NO If all of the above answers are "NO", then may proceed with Cephalosporin use.  . Symbicort [Budesonide-Formoterol Fumarate]  Other (See Comments)    Dry eye per pt.     Review Of Systems:  Constitutional:   No  weight loss, night sweats,  Fevers, chills, fatigue, or  lassitude.  HEENT:   No headaches,  Difficulty swallowing,  Tooth/dental problems, or  Sore throat,                No sneezing, itching, ear ache, nasal congestion, post nasal drip, Dry eyes  CV:  + chest pain,  Orthopnea, PND, swelling in lower extremities, anasarca, dizziness, palpitations, syncope.   GI  No heartburn, indigestion, abdominal pain, nausea,  vomiting, diarrhea, change in bowel habits, loss of appetite, bloody stools.   Resp: No shortness of breath with exertion or at rest.  + excess mucus, no productive cough,  No non-productive cough,  No coughing up of blood.  No change in color of mucus.  No wheezing.  No chest wall deformity  Skin: no rash or lesions.  GU: no dysuria, change in color of urine, no urgency or frequency.  No flank pain, no hematuria   MS:  No joint pain or swelling.  No decreased range of motion.  No back pain.  Psych:  No change in mood or affect. No depression or anxiety.  No memory loss.   Vital Signs BP 140/75 (BP Location: Left Arm, Cuff Size: Normal)   Pulse 88   Ht 5' (1.524 m)   Wt 144 lb 12.8 oz (65.7 kg)   SpO2 98%   BMI 28.28 kg/m    Physical Exam:  General- No distress,  A&Ox3, pleasant ENT: No sinus tenderness, TM clear, pale nasal mucosa, no oral exudate,no post nasal drip, no LAN Cardiac: S1, S2, regular rate and rhythm, no murmur Chest: No wheeze/ rales/ dullness; no accessory muscle use, no nasal flaring, no sternal retractions, pain with inspiration that goes away when she holds her breath Abd.: Soft Non-tender, ND, BS + Ext: No clubbing cyanosis, edema Neuro:  normal strength Skin: No rashes, warm and dry Psych: normal mood and behavior   Assessment/Plan  Pleurisy Bronchitis x 5-6 weeks in January Chest pain with inspiration x 5-6 weeks No fever Plan: We will do a CXR  today to evaluate your chest soreness. Take Tylenol 500 mg up to every 8 hours Ibuprofen 400 mg twice a day with food. We will give you a GERD diet Follow up in 1 month with Dr. Lamonte Sakai or Judson Roch NP. Please contact office for sooner follow up if symptoms do not improve or worsen or seek emergency care    Intrinsic asthma Stable interval Has resumed Symbicort Daily Plan: We will schedule PFT's  Continue Symbicort 2 puffs twice daily Pro Air is to be used only as a rescue medication, up to 4 times a day We will give you a GERD diet Follow up in 1 month with Dr. Lamonte Sakai or Judson Roch NP. Please contact office for sooner follow up if symptoms do not improve or worsen or seek emergency care      Magdalen Spatz, NP 09/13/2017  10:11 AM

## 2017-09-13 NOTE — Patient Instructions (Addendum)
We will do a CXR today to evaluate your chest soreness. We will schedule PFT's  Continue Symbicort 2 puffs twice daily Pro Air is to be used only as a rescue medication, up to 4 times a day Take Tylenol 500 mg up to every 8 hours Ibuprofen 400 mg twice a day with food. We will give you a GERD diet Follow up in 1 month with Dr. Lamonte Sakai or Judson Roch NP. Please contact office for sooner follow up if symptoms do not improve or worsen or seek emergency care

## 2017-09-14 ENCOUNTER — Telehealth: Payer: Self-pay | Admitting: Emergency Medicine

## 2017-09-14 NOTE — Telephone Encounter (Signed)
Pt is calling back [phone]

## 2017-09-14 NOTE — Telephone Encounter (Signed)
Called patient, unable to reach left message to give us a call back. 

## 2017-09-14 NOTE — Telephone Encounter (Signed)
Called and spoke with pt letting her know the results of her cxr and to continue with recs discussed with her at [place] with SG.  Pt expressed understanding. Nothing further needed at this time.

## 2017-09-16 DIAGNOSIS — J45901 Unspecified asthma with (acute) exacerbation: Secondary | ICD-10-CM | POA: Diagnosis not present

## 2017-10-10 DIAGNOSIS — Z1231 Encounter for screening mammogram for malignant neoplasm of breast: Secondary | ICD-10-CM | POA: Diagnosis not present

## 2017-10-16 DIAGNOSIS — J45901 Unspecified asthma with (acute) exacerbation: Secondary | ICD-10-CM | POA: Diagnosis not present

## 2017-10-18 ENCOUNTER — Ambulatory Visit: Payer: PPO | Admitting: Emergency Medicine

## 2017-10-18 ENCOUNTER — Ambulatory Visit (INDEPENDENT_AMBULATORY_CARE_PROVIDER_SITE_OTHER): Payer: PPO | Admitting: Emergency Medicine

## 2017-10-18 ENCOUNTER — Encounter: Payer: Self-pay | Admitting: Emergency Medicine

## 2017-10-18 DIAGNOSIS — J45909 Unspecified asthma, uncomplicated: Secondary | ICD-10-CM | POA: Diagnosis not present

## 2017-10-18 LAB — PULMONARY FUNCTION TEST
DL/VA % pred: 95 %
DL/VA: 3.8 ml/min/mmHg/L
DLCO unc % pred: 90 %
DLCO unc: 15.3 ml/min/mmHg
FEF 25-75 Post: 0.62 L/sec
FEF 25-75 Pre: 0.43 L/sec
FEF2575-%Change-Post: 44 %
FEF2575-%Pred-Post: 66 %
FEF2575-%Pred-Pre: 45 %
FEV1-%Change-Post: 17 %
FEV1-%Pred-Post: 104 %
FEV1-%Pred-Pre: 89 %
FEV1-Post: 1.13 L
FEV1-Pre: 0.96 L
FEV1FVC-%Change-Post: 8 %
FEV1FVC-%Pred-Pre: 67 %
FEV6-%Change-Post: 9 %
FEV6-%Pred-Post: 151 %
FEV6-%Pred-Pre: 138 %
FEV6-Post: 2.03 L
FEV6-Pre: 1.85 L
FEV6FVC-%Change-Post: 0 %
FEV6FVC-%Pred-Post: 104 %
FEV6FVC-%Pred-Pre: 103 %
FVC-%Change-Post: 8 %
FVC-%Pred-Post: 146 %
FVC-%Pred-Pre: 134 %
FVC-Post: 2.08 L
FVC-Pre: 1.91 L
Post FEV1/FVC ratio: 54 %
Post FEV6/FVC ratio: 98 %
Pre FEV1/FVC ratio: 50 %
Pre FEV6/FVC Ratio: 97 %
RV % pred: 157 %
RV: 3.34 L
TLC % pred: 123 %
TLC: 5.22 L

## 2017-10-18 NOTE — Patient Instructions (Signed)
Please continue your Symbicort twice a day. Remember to rinse and gargle after using  Keep albuterol available to use 2 puffs if needed for shortness of breath, wheeze, chest tightness.  Please continue omeprazole 20 mg once a day.  You might want to consider increasing this to twice a day for 2 weeks to see if better control of your reflux helps with management of your asthma. You may want to consider getting loratadine (Claritin) 10 mg to take once daily during the allergy season.  This might help you manage your asthma as well. Follow with Dr Lamonte Sakai in 6 months or sooner if you have any problems

## 2017-10-18 NOTE — Progress Notes (Signed)
Subjective:    Patient ID: [name], female    DOB: 1935-10-15, 82 y.o.   MRN: [id number]  Asthma  Her past medical history is significant for asthma.   82 year old woman, former remote smoker, followed by Dr Gwenette Greet for asthma. She has been more SOB - she has been on/off Dulera because she has been depending on samples. She has a sample with 10 puffs left. Using qd instead of bid. She is a difficult hx giver - unsure when the last time she needed pred was. Her breathing is better when she is on the [facility].   ROV 02/08/16 --  Patient follows up today for her history of asthma and former tobacco use. She was seen in January for an acute exacerbation in the setting of an upper respiratory infection. She was treated with prednisone and azithromycin. At that time she also wanted to change from [facility] to an alternative. She was started on Symbicort and feels that she is tolerating it well. She can tell that the symbicort is helping. She has cataracts, is concerned that ICS may be contributing to her vision. Uses albuterol about few times a week. Minimal dyspnea, no wheeze. She does state that she has intermittent breakthru reflux that seems to exacerbate UA irritation and also her asthma.   ROV 07/18/17 --patient is an 82 year old woman with a history of former tobacco use, COPD plus asthma.  Last seen here 01/2016.  She tells me that she has been doing . She uses symbicort reliably, albuterol usually about once a day but more recently. She had a URI just over a week ago - was treated with doxy for a bronchitis. She is having difficulty breathing on exertion, at night when she lays down to sleep. She is hearing some wheeze.   She has been having dry eyes, is under eval for possible cataracts.   ROV 10/18/17 --this follow-up visit for 82 year old woman with history of former tobacco, COPD with associated asthma.  She was seen here 09/13/2017 with chest discomfort, tightness, had been treated for a  bronchitis / URI.  Was recommended GERD diet, treated with analgesics for possible pleuritic chest pain.  She went back on Symbicort, has not seemed to worsen her dry eyes as we had initially suspected. She believes that the symbicort has been beneficial. She uses albuterol 1-2x a day. She has breakthrough GERD on OTC omeprazole once a day.  Anywhere   Pulmonary function testing was done today and I have reviewed.  This shows mild obstruction with a positive bronchodilator response, hyperinflated lung volumes and normal diffusion capacity.   Review of Systems Per HPI  Past Medical History:  Diagnosis Date  . Allergy   . Arthritis   . Asthma   . Asthma   . Blood clotting disorder ([place])   . Cataract   . COPD (chronic obstructive pulmonary disease) ([place])   . Depression   . Diverticulosis 10-2011   Colonoscopy  . GERD (gastroesophageal reflux disease)   . H/O seasonal allergies   . Heart murmur   . Hemorrhoids 10-2011   Colonoscopy  . Hemorrhoids, internal   . Hx of colonic polyp   . Hyperlipemia   . Hypertension   . IBS (irritable bowel syndrome)   . 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   EGD  . TIA (transient ischemic attack)      Family History  Problem Relation Age of Onset  . Heart disease Mother   . Hypertension Mother   .  Diabetes Mother   . Asthma Father   . Colon cancer Sister 3  . Irritable bowel syndrome Sister   . Breast cancer Unknown        Niece  . Stomach cancer Neg Hx      Social History   Socioeconomic History  . Marital status: Widowed    Spouse name: Not on file  . Number of children: 1  . Years of education: college  . Highest education level: Not on file  Occupational History  . Occupation: retired  Scientific laboratory technician  . Financial resource strain: Not on file  . Food insecurity:    Worry: Not on file    Inability: Not on file  . Transportation needs:    Medical: Not on file    Non-medical: Not on file  Tobacco Use  . Smoking status: Former  Smoker    Packs/day: 0.50    Types: Cigarettes    Last attempt to quit: 09/28/1959    Years since quitting: 58.0  . Smokeless tobacco: Never Used  Substance and Sexual Activity  . Alcohol use: No    Alcohol/week: 0.0 oz  . Drug use: No  . Sexual activity: Not on file  Lifestyle  . Physical activity:    Days per week: Not on file    Minutes per session: Not on file  . Stress: Not on file  Relationships  . Social connections:    Talks on phone: Not on file    Gets together: Not on file    Attends religious service: Not on file    Active member of club or organization: Not on file    Attends meetings of clubs or organizations: Not on file    Relationship status: Not on file  . Intimate partner violence:    Fear of current or ex partner: Not on file    Emotionally abused: Not on file    Physically abused: Not on file    Forced sexual activity: Not on file  Other Topics Concern  . Not on file  Social History Narrative   Patient is widowed with one child.   Patient is right handed.   Patient has college education.   Patient does not drink caffeine.     Allergies  Allergen Reactions  . Cepacol Shortness Of Breath    Difficulty breathing  . Lactose Intolerance (Gi) Shortness Of Breath    All dairy  Causes shortness of breath  . Other Hives, Shortness Of Breath and Swelling    Mushrooms (All mushrooms)  . Penicillins Shortness Of Breath and Swelling    Tongue and eyes swell Has patient had a PCN reaction causing immediYES Has patient had a PCN reaction causing severe rash involving mucus membranes or skin necrosis: NO Has patient had a PCN reaction that required hospitalization NO Has patient had a PCN reaction occurring within the last 10 years:NO If all of the above answers are "NO", then may proceed with Cephalosporin use.  . Symbicort [Budesonide-Formoterol Fumarate] Other (See Comments)    Dry eye per pt.      Outpatient Medications Prior to Visit  Medication Sig  Dispense Refill  . albuterol (PROVENTIL HFA;VENTOLIN HFA) 108 (90 Base) MCG/ACT inhaler Inhale 2 puffs into the lungs every 6 (six) hours as needed for wheezing or shortness of breath.    Marland Kitchen albuterol (PROVENTIL) (2.5 MG/3ML) 0.083% nebulizer solution Take 3 mLs (2.5 mg total) by nebulization every 4 (four) hours as needed for wheezing or shortness of  breath. 150 mL 5  . Alpha-D-Galactosidase (BEANO PO) Take 1 tablet by mouth as needed (gas and bloating).     . Ascorbic Acid (VITAMIN C) 1000 MG tablet Take 1,000 mg by mouth daily as needed (Cold).     . cholecalciferol (VITAMIN D) 1000 units tablet Take 1,000 Units by mouth daily.    . clopidogrel (PLAVIX) 75 MG tablet Take 75 mg by mouth daily.  1  . cycloSPORINE (RESTASIS) 0.05 % ophthalmic emulsion Place 1 drop into both eyes at bedtime as needed (for dry eyes or irritation). Reported on 07/14/2015    . losartan-hydrochlorothiazide (HYZAAR) 50-12.5 MG per tablet Take 1 tablet by mouth daily.     . meclizine (ANTIVERT) 25 MG tablet Take 1 tablet (25 mg total) by mouth 3 (three) times daily as needed. 20 tablet 0  . Multiple Vitamin (MULTIVITAMIN) tablet Take 1 tablet by mouth daily.    . ondansetron (ZOFRAN) 4 MG tablet Take 1 tablet (4 mg total) by mouth every 8 (eight) hours as needed for nausea or vomiting. 15 tablet 0  . polyethylene glycol (MIRALAX / GLYCOLAX) packet Take 17 g by mouth 2 (two) times daily as needed for mild constipation.    . Probiotic Product (PROBIOTIC PO) Take 1 capsule by mouth 3 (three) times daily after meals.     . simvastatin (ZOCOR) 20 MG tablet Take 1 tablet (20 mg total) by mouth daily at 6 PM. 90 tablet 3   No facility-administered medications prior to visit.          Objective:   Physical Exam Vitals:   10/18/17 1210 10/18/17 1211  BP:  126/70  Pulse:  73  SpO2:  99%  Weight: 142 lb (64.4 kg)   Height: 4' 10.5" (1.486 m)    Gen: Pleasant, elderly woman, in no distress,  normal affect  ENT: No  lesions,  mouth clear,  oropharynx clear, no postnasal drip  Neck: No JVD, no stridor  Lungs: No use of accessory muscles, distant, no wheezing  Cardiovascular: RRR, heart sounds normal, no murmur or gallops, no peripheral edema  Musculoskeletal: No deformities, no cyanosis or clubbing  Neuro: alert, non focal  Skin: Warm, no lesions or rashes      Assessment & Plan:  Intrinsic asthma Confirmed on her pulmonary function testing today.  She is clinically improved compared with last visit.  Please continue your Symbicort twice a day. Remember to rinse and gargle after using  Keep albuterol available to use 2 puffs if needed for shortness of breath, wheeze, chest tightness.  Please continue omeprazole 20 mg once a day.  You might want to consider increasing this to twice a day for 2 weeks to see if better control of your reflux helps with management of your asthma. You may want to consider getting loratadine (Claritin) 10 mg to take once daily during the allergy season.  This might help you manage your asthma as well. Follow with Dr Lamonte Sakai in 6 months or sooner if you have any problems   Baltazar Apo, MD, PhD 10/18/2017, 12:46 PM [facility] [phone] or if no answer [phone]

## 2017-10-18 NOTE — Assessment & Plan Note (Signed)
Confirmed on her pulmonary function testing today.  She is clinically improved compared with last visit.  Please continue your Symbicort twice a day. Remember to rinse and gargle after using  Keep albuterol available to use 2 puffs if needed for shortness of breath, wheeze, chest tightness.  Please continue omeprazole 20 mg once a day.  You might want to consider increasing this to twice a day for 2 weeks to see if better control of your reflux helps with management of your asthma. You may want to consider getting loratadine (Claritin) 10 mg to take once daily during the allergy season.  This might help you manage your asthma as well. Follow with Dr Lamonte Sakai in 6 months or sooner if you have any problems

## 2017-10-18 NOTE — Progress Notes (Signed)
PFT completed today 10/18/17.

## 2017-10-19 ENCOUNTER — Ambulatory Visit: Payer: PPO | Admitting: Podiatry

## 2017-11-16 DIAGNOSIS — J45901 Unspecified asthma with (acute) exacerbation: Secondary | ICD-10-CM | POA: Diagnosis not present

## 2017-12-16 DIAGNOSIS — J45901 Unspecified asthma with (acute) exacerbation: Secondary | ICD-10-CM | POA: Diagnosis not present

## 2018-01-16 DIAGNOSIS — J45901 Unspecified asthma with (acute) exacerbation: Secondary | ICD-10-CM | POA: Diagnosis not present

## 2018-01-30 DIAGNOSIS — I10 Essential (primary) hypertension: Secondary | ICD-10-CM | POA: Diagnosis not present

## 2018-01-30 DIAGNOSIS — E7800 Pure hypercholesterolemia, unspecified: Secondary | ICD-10-CM | POA: Diagnosis not present

## 2018-01-30 DIAGNOSIS — Z8673 Personal history of transient ischemic attack (TIA), and cerebral infarction without residual deficits: Secondary | ICD-10-CM | POA: Diagnosis not present

## 2018-01-30 DIAGNOSIS — J45909 Unspecified asthma, uncomplicated: Secondary | ICD-10-CM | POA: Diagnosis not present

## 2018-01-30 DIAGNOSIS — M8588 Other specified disorders of bone density and structure, other site: Secondary | ICD-10-CM | POA: Diagnosis not present

## 2018-01-30 DIAGNOSIS — Z111 Encounter for screening for respiratory tuberculosis: Secondary | ICD-10-CM | POA: Diagnosis not present

## 2018-01-30 DIAGNOSIS — K219 Gastro-esophageal reflux disease without esophagitis: Secondary | ICD-10-CM | POA: Diagnosis not present

## 2018-01-30 DIAGNOSIS — Z0000 Encounter for general adult medical examination without abnormal findings: Secondary | ICD-10-CM | POA: Diagnosis not present

## 2018-01-30 DIAGNOSIS — R413 Other amnesia: Secondary | ICD-10-CM | POA: Diagnosis not present

## 2018-01-30 DIAGNOSIS — E559 Vitamin D deficiency, unspecified: Secondary | ICD-10-CM | POA: Diagnosis not present

## 2018-01-30 DIAGNOSIS — M549 Dorsalgia, unspecified: Secondary | ICD-10-CM | POA: Diagnosis not present

## 2018-02-01 DIAGNOSIS — E559 Vitamin D deficiency, unspecified: Secondary | ICD-10-CM | POA: Diagnosis not present

## 2018-02-01 DIAGNOSIS — Z0000 Encounter for general adult medical examination without abnormal findings: Secondary | ICD-10-CM | POA: Diagnosis not present

## 2018-02-01 DIAGNOSIS — M8588 Other specified disorders of bone density and structure, other site: Secondary | ICD-10-CM | POA: Diagnosis not present

## 2018-02-01 DIAGNOSIS — K219 Gastro-esophageal reflux disease without esophagitis: Secondary | ICD-10-CM | POA: Diagnosis not present

## 2018-02-01 DIAGNOSIS — Z1389 Encounter for screening for other disorder: Secondary | ICD-10-CM | POA: Diagnosis not present

## 2018-02-01 DIAGNOSIS — R413 Other amnesia: Secondary | ICD-10-CM | POA: Diagnosis not present

## 2018-02-01 DIAGNOSIS — Z8673 Personal history of transient ischemic attack (TIA), and cerebral infarction without residual deficits: Secondary | ICD-10-CM | POA: Diagnosis not present

## 2018-02-01 DIAGNOSIS — M549 Dorsalgia, unspecified: Secondary | ICD-10-CM | POA: Diagnosis not present

## 2018-02-01 DIAGNOSIS — I10 Essential (primary) hypertension: Secondary | ICD-10-CM | POA: Diagnosis not present

## 2018-02-01 DIAGNOSIS — Z111 Encounter for screening for respiratory tuberculosis: Secondary | ICD-10-CM | POA: Diagnosis not present

## 2018-02-01 DIAGNOSIS — E7800 Pure hypercholesterolemia, unspecified: Secondary | ICD-10-CM | POA: Diagnosis not present

## 2018-02-01 DIAGNOSIS — J45909 Unspecified asthma, uncomplicated: Secondary | ICD-10-CM | POA: Diagnosis not present

## 2018-02-05 DIAGNOSIS — M8589 Other specified disorders of bone density and structure, multiple sites: Secondary | ICD-10-CM | POA: Diagnosis not present

## 2018-02-07 ENCOUNTER — Ambulatory Visit (INDEPENDENT_AMBULATORY_CARE_PROVIDER_SITE_OTHER): Payer: PPO

## 2018-02-07 ENCOUNTER — Ambulatory Visit (INDEPENDENT_AMBULATORY_CARE_PROVIDER_SITE_OTHER): Payer: PPO | Admitting: Podiatry

## 2018-02-07 ENCOUNTER — Encounter: Payer: Self-pay | Admitting: Podiatry

## 2018-02-07 VITALS — BP 107/68 | HR 65

## 2018-02-07 DIAGNOSIS — M7752 Other enthesopathy of left foot: Secondary | ICD-10-CM | POA: Diagnosis not present

## 2018-02-07 DIAGNOSIS — M205X1 Other deformities of toe(s) (acquired), right foot: Secondary | ICD-10-CM | POA: Diagnosis not present

## 2018-02-07 DIAGNOSIS — M76829 Posterior tibial tendinitis, unspecified leg: Secondary | ICD-10-CM | POA: Diagnosis not present

## 2018-02-07 DIAGNOSIS — M7750 Other enthesopathy of unspecified foot: Secondary | ICD-10-CM

## 2018-02-08 ENCOUNTER — Telehealth: Payer: Self-pay | Admitting: *Deleted

## 2018-02-08 DIAGNOSIS — R0989 Other specified symptoms and signs involving the circulatory and respiratory systems: Secondary | ICD-10-CM

## 2018-02-08 NOTE — Progress Notes (Addendum)
Subjective:   Patient ID: [name], female   DOB: 82 y.o.   MRN: [id number]   HPI 82 year old female presents the office today for concerns of left ankle pain which is been all over the last couple of years.  She does have history of a previous ankle fracture several years ago she did well for a long time however recently she started pain to her ankle she points on the medial aspect.  She states the pain is intermittent but is on a daily basis and it depends on how much she is walking.  She denies any recent injury or trauma or any significant swelling or redness.  She also has some issues with her right fourth toe of the toe is currently in the nail did examine she gets a corn on the lateral aspect of the toe.  No recent treatment no swelling redness or drainage.  No other concerns.   Review of Systems  All other systems reviewed and are negative.  Past Medical History:  Diagnosis Date  . Allergy   . Arthritis   . Asthma   . Asthma   . Blood clotting disorder ([place])   . Cataract   . COPD (chronic obstructive pulmonary disease) ([place])   . Depression   . Diverticulosis 10-2011   Colonoscopy  . GERD (gastroesophageal reflux disease)   . H/O seasonal allergies   . Heart murmur   . Hemorrhoids 10-2011   Colonoscopy  . Hemorrhoids, internal   . Hx of colonic polyp   . Hyperlipemia   . Hypertension   . IBS (irritable bowel syndrome)   . 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   EGD  . TIA (transient ischemic attack)     Past Surgical History:  Procedure Laterality Date  . [place]  . LAPAROSCOPIC CHOLECYSTECTOMY  1995     Current Outpatient Medications:  .  albuterol (PROVENTIL HFA;VENTOLIN HFA) 108 (90 Base) MCG/ACT inhaler, Inhale 2 puffs into the lungs every 6 (six) hours as needed for wheezing or shortness of breath., Disp: , Rfl:  .  albuterol (PROVENTIL) (2.5 MG/3ML) 0.083% nebulizer solution, Take 3 mLs (2.5 mg total) by nebulization every 4 (four) hours as  needed for wheezing or shortness of breath., Disp: 150 mL, Rfl: 5 .  Alpha-D-Galactosidase (BEANO PO), Take 1 tablet by mouth as needed (gas and bloating). , Disp: , Rfl:  .  Ascorbic Acid (VITAMIN C) 1000 MG tablet, Take 1,000 mg by mouth daily as needed (Cold). , Disp: , Rfl:  .  cholecalciferol (VITAMIN D) 1000 units tablet, Take 1,000 Units by mouth daily., Disp: , Rfl:  .  clopidogrel (PLAVIX) 75 MG tablet, Take 75 mg by mouth daily., Disp: , Rfl: 1 .  cycloSPORINE (RESTASIS) 0.05 % ophthalmic emulsion, Place 1 drop into both eyes at bedtime as needed (for dry eyes or irritation). Reported on 07/14/2015, Disp: , Rfl:  .  EPINEPHrine 0.3 mg/0.3 mL IJ SOAJ injection, , Disp: , Rfl:  .  hydrochlorothiazide (HYDRODIURIL) 12.5 MG tablet, TAKE 1 TABLET BY MOUTH ONCE DAILY IN THE MORNING, Disp: , Rfl: 0 .  losartan (COZAAR) 50 MG tablet, TAKE 1 TABLET BY MOUTH ONCE DAILY FOR 90 DAYS, Disp: , Rfl: 0 .  losartan-hydrochlorothiazide (HYZAAR) 50-12.5 MG per tablet, Take 1 tablet by mouth daily. , Disp: , Rfl:  .  Multiple Vitamin (MULTIVITAMIN) tablet, Take 1 tablet by mouth daily., Disp: , Rfl:  .  polyethylene glycol (MIRALAX / GLYCOLAX) packet, Take 17  g by mouth 2 (two) times daily as needed for mild constipation., Disp: , Rfl:  .  Probiotic Product (PROBIOTIC PO), Take 1 capsule by mouth 3 (three) times daily after meals. , Disp: , Rfl:  .  simvastatin (ZOCOR) 20 MG tablet, Take 1 tablet (20 mg total) by mouth daily at 6 PM., Disp: 90 tablet, Rfl: 3  Allergies  Allergen Reactions  . Cepacol Shortness Of Breath    Difficulty breathing  . Lactose Intolerance (Gi) Shortness Of Breath    All dairy  Causes shortness of breath  . Other Hives, Shortness Of Breath and Swelling    Mushrooms (All mushrooms)  . Penicillins Shortness Of Breath and Swelling    Tongue and eyes swell Has patient had a PCN reaction causing immediYES Has patient had a PCN reaction causing severe rash involving mucus membranes  or skin necrosis: NO Has patient had a PCN reaction that required hospitalization NO Has patient had a PCN reaction occurring within the last 10 years:NO If all of the above answers are "NO", then may proceed with Cephalosporin use.  . Sulfamethoxazole   . Symbicort [Budesonide-Formoterol Fumarate] Other (See Comments)    Dry eye per pt.          Objective:  Physical Exam  General: AAO x3, NAD  Dermatological: Lesion lateral fourth digit on the PIPJ.  No underlying tissue, drainage or any signs of infection.  The nails are mildly elongated and the fourth digit toenails causing some pain of the long.  No open lesions identified otherwise.  Vascular: DP 2/4, PT decreased, CRT < 3 seconds   Neruologic: Grossly intact via light touch bilateral.  Protective threshold with Semmes Wienstein monofilament intact to all pedal sites bilateral.  Negative Tinel sign.  Musculoskeletal: There is a decrease in medial arch upon weightbearing bilaterally present to the toes resulting the hyperkeratotic lesion on the right fourth toe.  There is tenderness along the course of the posterior tibial tendon on the left ankle just posterior and inferior to the medial malleolus.  She is able to perform a single and double heel rise.  There is no edema, erythema, increase in warmth.  There is no area pinpoint bony tenderness.  There is no pain with ankle joint range of motion there is no crepitation.  Muscular strength 5/5 in all groups tested bilateral.  Gait: Unassisted, Nonantalgic.      Assessment:   82 year old female with left posterior tibial tendinitis, adductovarus right foot    Plan:  -Treatment options discussed including all alternatives, risks, and complications -Etiology of symptoms were discussed -X-rays were obtained and reviewed with the patient.  Negative acute fracture or stress fracture identified today.  Ankle joint intact. Vessel calcification is present.  -I debrided the hyperkeratotic  lesion of the right fourth toe without any complications or bleeding.  Offloading pads were dispensed. -In regards to left ankle dispensed a Tri-Lock ankle brace today.  We discussed stretching, rehab exercises.  Ice to the area daily.  Long-term she is in the benefit from orthotics.  She is going to start with OTC inserts and a coupon was given for [employer].  Discussed supportive shoes. -ABI/TBI ordered   Trula Slade DPM

## 2018-02-08 NOTE — Telephone Encounter (Signed)
Faxed required form, clinicals and demographics to [facility]. Faxed orders to [facility], with note [facility] pre-cert had been started.

## 2018-02-08 NOTE — Telephone Encounter (Signed)
-----   Message from Trula Slade, DPM sent at 02/08/2018  7:09 AM EDT ----- Can you please order ABI/TBI due to decreased pulses? Thanks.

## 2018-02-14 ENCOUNTER — Encounter: Payer: Self-pay | Admitting: Gastroenterology

## 2018-02-15 DIAGNOSIS — M546 Pain in thoracic spine: Secondary | ICD-10-CM | POA: Diagnosis not present

## 2018-02-15 DIAGNOSIS — M545 Low back pain: Secondary | ICD-10-CM | POA: Diagnosis not present

## 2018-02-16 DIAGNOSIS — J45901 Unspecified asthma with (acute) exacerbation: Secondary | ICD-10-CM | POA: Diagnosis not present

## 2018-02-19 ENCOUNTER — Ambulatory Visit (HOSPITAL_COMMUNITY)
Admission: RE | Admit: 2018-02-19 | Discharge: 2018-02-19 | Disposition: A | Discharge status: Home / Self Care | Payer: PPO | Source: Ambulatory Visit | Attending: Cardiology | Admitting: Cardiology

## 2018-02-19 DIAGNOSIS — R0989 Other specified symptoms and signs involving the circulatory and respiratory systems: Secondary | ICD-10-CM | POA: Insufficient documentation

## 2018-02-21 DIAGNOSIS — M4804 Spinal stenosis, thoracic region: Secondary | ICD-10-CM | POA: Diagnosis not present

## 2018-02-21 DIAGNOSIS — M48062 Spinal stenosis, lumbar region with neurogenic claudication: Secondary | ICD-10-CM | POA: Diagnosis not present

## 2018-02-26 DIAGNOSIS — M48062 Spinal stenosis, lumbar region with neurogenic claudication: Secondary | ICD-10-CM | POA: Diagnosis not present

## 2018-02-26 DIAGNOSIS — M4804 Spinal stenosis, thoracic region: Secondary | ICD-10-CM | POA: Diagnosis not present

## 2018-02-28 ENCOUNTER — Telehealth: Payer: Self-pay | Admitting: *Deleted

## 2018-02-28 NOTE — Telephone Encounter (Signed)
Called and left message for the patient stating that the circulation test was normal and to call the office if any concerns or questions at [phone]. Lattie Haw

## 2018-03-01 DIAGNOSIS — M4804 Spinal stenosis, thoracic region: Secondary | ICD-10-CM | POA: Diagnosis not present

## 2018-03-01 DIAGNOSIS — M48062 Spinal stenosis, lumbar region with neurogenic claudication: Secondary | ICD-10-CM | POA: Diagnosis not present

## 2018-03-05 DIAGNOSIS — M48062 Spinal stenosis, lumbar region with neurogenic claudication: Secondary | ICD-10-CM | POA: Diagnosis not present

## 2018-03-05 DIAGNOSIS — M4804 Spinal stenosis, thoracic region: Secondary | ICD-10-CM | POA: Diagnosis not present

## 2018-03-07 DIAGNOSIS — M4804 Spinal stenosis, thoracic region: Secondary | ICD-10-CM | POA: Diagnosis not present

## 2018-03-07 DIAGNOSIS — M48062 Spinal stenosis, lumbar region with neurogenic claudication: Secondary | ICD-10-CM | POA: Diagnosis not present

## 2018-03-14 DIAGNOSIS — M4804 Spinal stenosis, thoracic region: Secondary | ICD-10-CM | POA: Diagnosis not present

## 2018-03-14 DIAGNOSIS — M48062 Spinal stenosis, lumbar region with neurogenic claudication: Secondary | ICD-10-CM | POA: Diagnosis not present

## 2018-03-18 DIAGNOSIS — J45901 Unspecified asthma with (acute) exacerbation: Secondary | ICD-10-CM | POA: Diagnosis not present

## 2018-03-25 DIAGNOSIS — M48062 Spinal stenosis, lumbar region with neurogenic claudication: Secondary | ICD-10-CM | POA: Diagnosis not present

## 2018-03-25 DIAGNOSIS — M4804 Spinal stenosis, thoracic region: Secondary | ICD-10-CM | POA: Diagnosis not present

## 2018-04-02 IMAGING — DX DG CHEST 2V
2 series · 2 of 2 positions shown · non-contrast
Comparison: 07/13/2017

CLINICAL DATA: Follow-up bronchitis, dyspnea, chest soreness with
deep breathing intermittently for years but now worsening, history
asthma, hypertension, COPD, former smoker

EXAM:
CHEST - 2 VIEW

[chest pa]
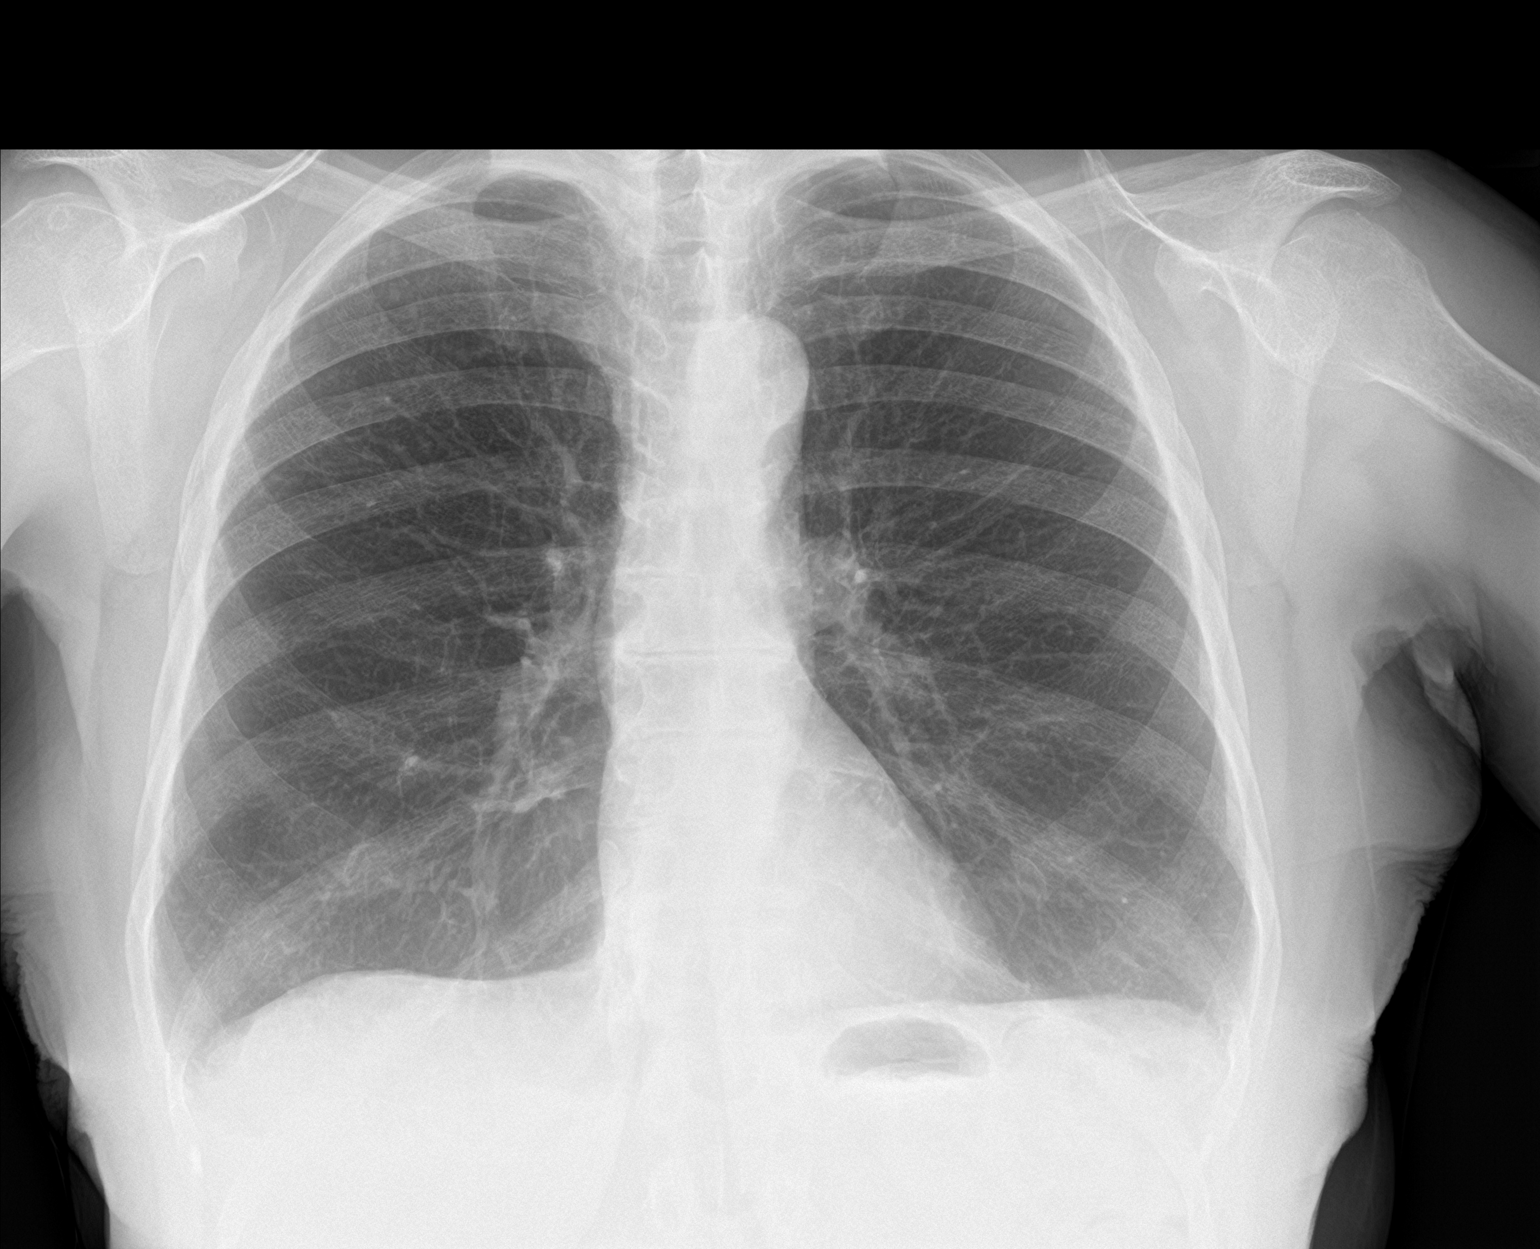

[chest lat]
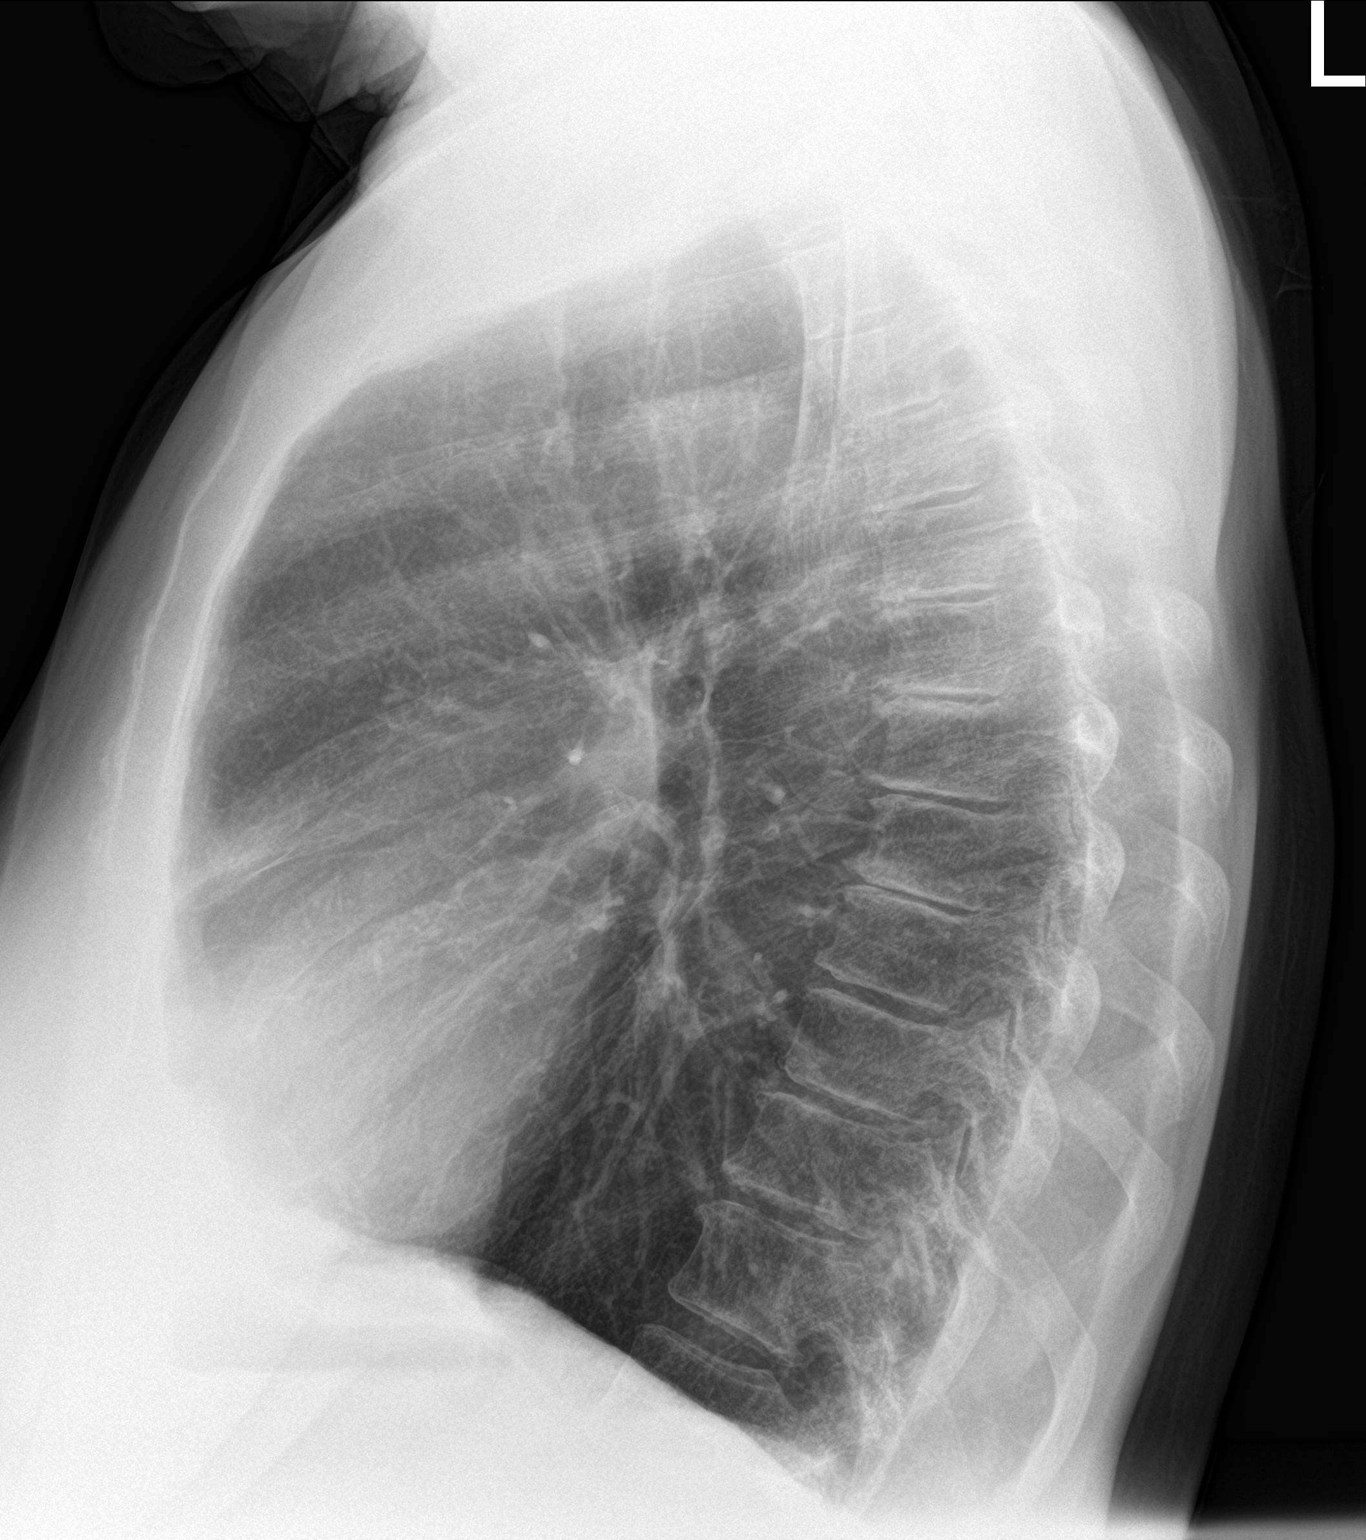

[2 of 2 positions shown; findings below may reference images not displayed]

FINDINGS: Normal heart size, mediastinal contours, and pulmonary vascularity.

Lungs hyperaerated but clear.

No acute infiltrate, pleural effusion or pneumothorax.

Bones unremarkable.
IMPRESSION: Hyperaerated lungs consistent with history of COPD.

No acute infiltrate.

## 2018-04-09 DIAGNOSIS — H18413 Arcus senilis, bilateral: Secondary | ICD-10-CM | POA: Diagnosis not present

## 2018-04-09 DIAGNOSIS — H2513 Age-related nuclear cataract, bilateral: Secondary | ICD-10-CM | POA: Diagnosis not present

## 2018-04-09 DIAGNOSIS — H16223 Keratoconjunctivitis sicca, not specified as Sjogren's, bilateral: Secondary | ICD-10-CM | POA: Diagnosis not present

## 2018-04-09 DIAGNOSIS — H25013 Cortical age-related cataract, bilateral: Secondary | ICD-10-CM | POA: Diagnosis not present

## 2018-04-09 DIAGNOSIS — M4804 Spinal stenosis, thoracic region: Secondary | ICD-10-CM | POA: Diagnosis not present

## 2018-04-09 DIAGNOSIS — M48062 Spinal stenosis, lumbar region with neurogenic claudication: Secondary | ICD-10-CM | POA: Diagnosis not present

## 2018-04-11 ENCOUNTER — Ambulatory Visit: Payer: PPO | Admitting: Gastroenterology

## 2018-04-11 ENCOUNTER — Encounter: Payer: Self-pay | Admitting: Gastroenterology

## 2018-04-11 VITALS — BP 110/72 | HR 76 | Ht 60.0 in | Wt 140.1 lb

## 2018-04-11 DIAGNOSIS — Z800 Family history of malignant neoplasm of digestive organs: Secondary | ICD-10-CM

## 2018-04-11 DIAGNOSIS — K219 Gastro-esophageal reflux disease without esophagitis: Secondary | ICD-10-CM

## 2018-04-11 DIAGNOSIS — Z7902 Long term (current) use of antithrombotics/antiplatelets: Secondary | ICD-10-CM

## 2018-04-11 DIAGNOSIS — Z86010 Personal history of colonic polyps: Secondary | ICD-10-CM

## 2018-04-11 DIAGNOSIS — R1319 Other dysphagia: Secondary | ICD-10-CM | POA: Diagnosis not present

## 2018-04-11 MED ORDER — OMEPRAZOLE 40 MG PO CPDR: 40.0000 mg | DELAYED_RELEASE_CAPSULE | Freq: Every day | ORAL | 3 refills | Status: DC

## 2018-04-11 NOTE — Progress Notes (Signed)
[name]    [id number]    [birth year]  Primary Care Physician:Barnes, Benjamine Mola, MD  Referring Physician: Leighton Ruff, [place], [place] [address]  Chief complaint: GERD, dysphagia, sore throat, colorectal cancer screening  HPI: 82 year old African-American female here for follow-up visit, last seen in office March 21, 2017. She is having regurgitation, acid taste, heartburn and severe sore throat despite taking over-the-counter omeprazole and Pepcid as needed.  She is having almost daily symptoms.  She is also having trouble swallowing with intermittent choking sensation and fullness in the throat worse with food.  Denies any change in bowel habits, blood in stool, melena, hematochezia, abdominal pain, loss of appetite or weight loss.  Stopped taking PPI and H2 blocker in the past due to concern of potential side effects with the medication use  She is on chronic antiplatelet therapy with Plavix daily.  EGD Nov 16, 2011 by Dr. Deatra Ina status post dilation with [facility] dilator to 18 mm and fundic gland polyps   Colonoscopy Nov 16, 2011 by Dr. Deatra Ina showed colonic diverticulosis and hemorrhoids otherwise normal  Colonoscopy Nov 11, 2004 by Dr. Thana Farr showed large pedunculated polyp ~60mm in the ascending colon, colonic diverticulosis and internal hemorrhoids   Sister had colon cancer in her 78's  Her Niece, sister's daughter has metastatic breast cancer and also myeloma  Patient has been reluctant to undergo colonoscopy as she is worried about potential complications associated with anesthesia and procedure.  She is also reluctant to hold Plavix and is worried that she may have a clot if she does not take it for few days prior to the procedure.   Outpatient Encounter Medications as of 04/11/2018  Medication Sig  . albuterol (PROVENTIL HFA;VENTOLIN HFA) 108 (90 Base) MCG/ACT inhaler Inhale 2 puffs into the lungs every 6 (six) hours as  needed for wheezing or shortness of breath.  Marland Kitchen albuterol (PROVENTIL) (2.5 MG/3ML) 0.083% nebulizer solution Take 3 mLs (2.5 mg total) by nebulization every 4 (four) hours as needed for wheezing or shortness of breath.  . Alpha-D-Galactosidase (BEANO PO) Take 1 tablet by mouth as needed (gas and bloating).   . Ascorbic Acid (VITAMIN C) 1000 MG tablet Take 1,000 mg by mouth daily as needed (Cold).   . cholecalciferol (VITAMIN D) 1000 units tablet Take 1,000 Units by mouth daily.  . clopidogrel (PLAVIX) 75 MG tablet Take 75 mg by mouth daily.  . cycloSPORINE (RESTASIS) 0.05 % ophthalmic emulsion Place 1 drop into both eyes at bedtime as needed (for dry eyes or irritation). Reported on 07/14/2015  . EPINEPHrine 0.3 mg/0.3 mL IJ SOAJ injection   . losartan-hydrochlorothiazide (HYZAAR) 50-12.5 MG per tablet Take 1 tablet by mouth daily.   . Multiple Vitamin (MULTIVITAMIN) tablet Take 1 tablet by mouth daily.  Marland Kitchen omeprazole (PRILOSEC) 10 MG capsule Take 10 mg by mouth as needed.  . polyethylene glycol (MIRALAX / GLYCOLAX) packet Take 17 g by mouth 2 (two) times daily as needed for mild constipation.  . Probiotic Product (PROBIOTIC PO) Take 1 capsule by mouth 3 (three) times daily after meals.   . simvastatin (ZOCOR) 20 MG tablet Take 1 tablet (20 mg total) by mouth daily at 6 PM.  . [DISCONTINUED] hydrochlorothiazide (HYDRODIURIL) 12.5 MG tablet TAKE 1 TABLET BY MOUTH ONCE DAILY IN THE MORNING  . [DISCONTINUED] losartan (COZAAR) 50 MG tablet TAKE 1 TABLET BY MOUTH ONCE DAILY FOR 90 DAYS   No facility-administered encounter  medications on file as of 04/11/2018.     Allergies as of 04/11/2018 - Review Complete 04/11/2018  Allergen Reaction Noted  . Cepacol Shortness Of Breath 09/28/2011  . Lactose intolerance (gi) Shortness Of Breath 12/29/2012  . Other Hives, Shortness Of Breath, and Swelling 10/16/2014  . Penicillins Shortness Of Breath and Swelling 05/02/2012  . Sulfamethoxazole  05/02/2012  .  Symbicort [budesonide-formoterol fumarate] Other (See Comments) 09/11/2017    Past Medical History:  Diagnosis Date  . Allergy   . Arthritis   . Asthma   . Asthma   . Blood clotting disorder ([facility])   . Cataract   . COPD (chronic obstructive pulmonary disease) ([place])   . Depression   . Diverticulosis 10-2011   Colonoscopy  . GERD (gastroesophageal reflux disease)   . H/O seasonal allergies   . Heart murmur   . Hemorrhoids 10-2011   Colonoscopy  . Hemorrhoids, internal   . Hx of colonic polyp   . Hyperlipemia   . Hypertension   . IBS (irritable bowel syndrome)   . 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   EGD  . TIA (transient ischemic attack)     Past Surgical History:  Procedure Laterality Date  . [place]  . LAPAROSCOPIC CHOLECYSTECTOMY  1995    Family History  Problem Relation Age of Onset  . Heart disease Mother   . Hypertension Mother   . Diabetes Mother   . Asthma Father   . Colon cancer Sister 52  . Irritable bowel syndrome Sister   . Breast cancer Unknown        Niece  . Stomach cancer Neg Hx     Social History   Socioeconomic History  . Marital status: Widowed    Spouse name: Not on file  . Number of children: 1  . Years of education: college  . Highest education level: Not on file  Occupational History  . Occupation: retired  Scientific laboratory technician  . Financial resource strain: Not on file  . Food insecurity:    Worry: Not on file    Inability: Not on file  . Transportation needs:    Medical: Not on file    Non-medical: Not on file  Tobacco Use  . Smoking status: Former Smoker    Packs/day: 0.50    Types: Cigarettes    Last attempt to quit: 09/28/1959    Years since quitting: 58.5  . Smokeless tobacco: Never Used  Substance and Sexual Activity  . Alcohol use: No    Alcohol/week: 0.0 standard drinks  . Drug use: No  . Sexual activity: Not on file  Lifestyle  . Physical activity:    Days per week: Not on file    Minutes per session: Not on  file  . Stress: Not on file  Relationships  . Social connections:    Talks on phone: Not on file    Gets together: Not on file    Attends religious service: Not on file    Active member of club or organization: Not on file    Attends meetings of clubs or organizations: Not on file    Relationship status: Not on file  . Intimate partner violence:    Fear of current or ex partner: Not on file    Emotionally abused: Not on file    Physically abused: Not on file    Forced sexual activity: Not on file  Other Topics Concern  . Not on file  Social History Narrative  Patient is widowed with one child.   Patient is right handed.   Patient has college education.   Patient does not drink caffeine.      Review of systems: Review of Systems  Constitutional: Negative for fever and chills.  Positive for lack of energy HENT: Negative.   Eyes: Negative for blurred vision.  Respiratory: Negative for cough, shortness of breath and wheezing.   Cardiovascular: Negative for chest pain and palpitations.  Gastrointestinal: as per HPI Genitourinary: Negative for dysuria, urgency, frequency and hematuria.  Musculoskeletal: Positive for myalgias, back pain and joint pain.  Skin: Negative for itching and rash.  Neurological: Negative for tremors, focal weakness, seizures and loss of consciousness.  Endo/Heme/Allergies: Positive for seasonal allergies.  Psychiatric/Behavioral: Negative for depression, suicidal ideas and hallucinations.  All other systems reviewed and are negative.   Physical Exam: Vitals:   04/11/18 1045  BP: 110/72  Pulse: 76   Body mass index is 27.37 kg/m. Gen:      No acute distress HEENT:  EOMI, sclera anicteric Neck:     No masses; no thyromegaly Lungs:    Clear to auscultation bilaterally; normal respiratory effort CV:         Regular rate and rhythm; no murmurs Abd:      + bowel sounds; soft, non-tender; no palpable masses, no distension Ext:    No edema;  adequate peripheral perfusion Skin:      Warm and dry; no rash Neuro: alert and oriented x 3 Psych: normal mood and affect  Data Reviewed:  Reviewed labs, radiology imaging, old records and pertinent past GI work up   Assessment and Plan/Recommendations:  82 year old female with family history of colon cancer, personal history of advanced adenoma status post removal in 2006, chronic GERD, esophageal stricture status post dilation 2013 here with complaints of worsening GERD symptoms and dysphagia Patient is reluctant to undergo endoscopy or colonoscopy at this point.  She is worried about potential risks and complications.  She is also worried about possible clot if she holds Plavix prior to the procedure Advised patient to contact us if she changes her mind or develops any symptoms but we will not place a recall for colonoscopy due to her advanced age  Will schedule barium esophagram to exclude severe esophageal stricture or neoplastic lesion Start omeprazole 40 mg daily, 30 minutes before breakfast Antireflux measures  40 minutes was spent face-to-face with the patient. Greater than 50% of the time used for counseling as well as treatment plan and follow-up. She had multiple questions which were answered to her satisfaction  K. Denzil Magnuson , MD [phone]    CC: Leighton Ruff, MD

## 2018-04-11 NOTE — Patient Instructions (Addendum)
You have been scheduled for a Barium Esophogram at [facility] (1st floor of the hospital) on 05/02/2018 at 9:30am. Please arrive 15 minutes prior to your appointment for registration. Make certain not to have anything to eat or drink 3 hours prior to your test. If you need to reschedule for any reason, please contact radiology at [phone] to do so. __________________________________________________________________ A barium swallow is an examination that concentrates on views of the esophagus. This tends to be a double contrast exam (barium and two liquids which, when combined, create a gas to distend the wall of the oesophagus) or single contrast (non-ionic iodine based). The study is usually tailored to your symptoms so a good history is essential. Attention is paid during the study to the form, structure and configuration of the esophagus, looking for functional disorders (such as aspiration, dysphagia, achalasia, motility and reflux) EXAMINATION You may be asked to change into a gown, depending on the type of swallow being performed. A radiologist and radiographer will perform the procedure. The radiologist will advise you of the type of contrast selected for your procedure and direct you during the exam. You will be asked to stand, sit or lie in several different positions and to hold a small amount of fluid in your mouth before being asked to swallow while the imaging is performed .In some instances you may be asked to swallow barium coated marshmallows to assess the motility of a solid food bolus. The exam can be recorded as a digital or video fluoroscopy procedure. POST PROCEDURE It will take 1-2 days for the barium to pass through your system. To facilitate this, it is important, unless otherwise directed, to increase your fluids for the next 24-48hrs and to resume your normal diet.  This test typically takes about 30 minutes to perform.    If you are age 70 or older, your body  mass index should be between 23-30. Your Body mass index is 27.37 kg/m. If this is out of the aforementioned range listed, please consider follow up with your Primary Care Provider.  If you are age 15 or younger, your body mass index should be between 19-25. Your Body mass index is 27.37 kg/m. If this is out of the aformentioned range listed, please consider follow up with your Primary Care Provider.    Thank you for choosing [facility]  Kavitha Nandigam,MD __________________________________________________________________________________

## 2018-04-12 DIAGNOSIS — M4804 Spinal stenosis, thoracic region: Secondary | ICD-10-CM | POA: Diagnosis not present

## 2018-04-12 DIAGNOSIS — M48062 Spinal stenosis, lumbar region with neurogenic claudication: Secondary | ICD-10-CM | POA: Diagnosis not present

## 2018-04-16 ENCOUNTER — Ambulatory Visit: Payer: PPO | Admitting: Emergency Medicine

## 2018-04-16 ENCOUNTER — Encounter: Payer: Self-pay | Admitting: Emergency Medicine

## 2018-04-16 DIAGNOSIS — J301 Allergic rhinitis due to pollen: Secondary | ICD-10-CM | POA: Diagnosis not present

## 2018-04-16 DIAGNOSIS — J45909 Unspecified asthma, uncomplicated: Secondary | ICD-10-CM

## 2018-04-16 DIAGNOSIS — K219 Gastro-esophageal reflux disease without esophagitis: Secondary | ICD-10-CM | POA: Diagnosis not present

## 2018-04-16 NOTE — Assessment & Plan Note (Signed)
Not currently on rx

## 2018-04-16 NOTE — Progress Notes (Signed)
Subjective:    Patient ID: [name], female    DOB: 1935/12/23, 82 y.o.   MRN: [id number]  Asthma  Her past medical history is significant for asthma.   82 year old woman, former remote smoker, followed by Dr Gwenette Greet for asthma. She has been more SOB - she has been on/off Dulera because she has been depending on samples. She has a sample with 10 puffs left. Using qd instead of bid. She is a difficult hx giver - unsure when the last time she needed pred was. Her breathing is better when she is on the [facility].   ROV 02/08/16 --  Patient follows up today for her history of asthma and former tobacco use. She was seen in January for an acute exacerbation in the setting of an upper respiratory infection. She was treated with prednisone and azithromycin. At that time she also wanted to change from [facility] to an alternative. She was started on Symbicort and feels that she is tolerating it well. She can tell that the symbicort is helping. She has cataracts, is concerned that ICS may be contributing to her vision. Uses albuterol about few times a week. Minimal dyspnea, no wheeze. She does state that she has intermittent breakthru reflux that seems to exacerbate UA irritation and also her asthma.   ROV 07/18/17 --patient is an 82 year old woman with a history of former tobacco use, COPD plus asthma.  Last seen here 01/2016.  She tells me that she has been doing . She uses symbicort reliably, albuterol usually about once a day but more recently. She had a URI just over a week ago - was treated with doxy for a bronchitis. She is having difficulty breathing on exertion, at night when she lays down to sleep. She is hearing some wheeze.   She has been having dry eyes, is under eval for possible cataracts.   ROV 10/18/17 --this follow-up visit for 82 year old woman with history of former tobacco, COPD with associated asthma.  She was seen here 09/13/2017 with chest discomfort, tightness, had been treated for a  bronchitis / URI.  Was recommended GERD diet, treated with analgesics for possible pleuritic chest pain.  She went back on Symbicort, has not seemed to worsen her dry eyes as we had initially suspected. She believes that the symbicort has been beneficial. She uses albuterol 1-2x a day. She has breakthrough GERD on OTC omeprazole once a day.  Anywhere  Pulmonary function testing was done today and I have reviewed.  This shows mild obstruction with a positive bronchodilator response, hyperinflated lung volumes and normal diffusion capacity.  ROV 04/16/18 --follow-up visit for 82 year old woman with a history of tobacco and COPD with asthmatic component.  She also has GERD, intermittent allergic rhinitis symptoms.  She saw Dr. Silverio Decamp recently with gastroenterology to evaluate worsening GERD and some dysphasia.  A barium esophagram is scheduled. Has some burning and sore throat. She is experiencing some dyspnea at times when her reflux is active. She is on Symbicort and feels that she is benefiting. She is not using albuterol every day anymore. She remains active. No flares since last time. I offered her the flu shot today - she is hesitant to get it.     Review of Systems Per HPI  Past Medical History:  Diagnosis Date  . Allergy   . Arthritis   . Asthma   . Asthma   . Blood clotting disorder ([place])   . Cataract   . COPD (chronic obstructive pulmonary  disease) ([place])   . Depression   . Diverticulosis 10-2011   Colonoscopy  . GERD (gastroesophageal reflux disease)   . H/O seasonal allergies   . Heart murmur   . Hemorrhoids 10-2011   Colonoscopy  . Hemorrhoids, internal   . Hx of colonic polyp   . Hyperlipemia   . Hypertension   . IBS (irritable bowel syndrome)   . 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   EGD  . TIA (transient ischemic attack)      Family History  Problem Relation Age of Onset  . Heart disease Mother   . Hypertension Mother   . Diabetes Mother   . Asthma Father   . Colon  cancer Sister 58  . Irritable bowel syndrome Sister   . Breast cancer Unknown        Niece  . Stomach cancer Neg Hx      Social History   Socioeconomic History  . Marital status: Widowed    Spouse name: Not on file  . Number of children: 1  . Years of education: college  . Highest education level: Not on file  Occupational History  . Occupation: retired  Scientific laboratory technician  . Financial resource strain: Not on file  . Food insecurity:    Worry: Not on file    Inability: Not on file  . Transportation needs:    Medical: Not on file    Non-medical: Not on file  Tobacco Use  . Smoking status: Former Smoker    Packs/day: 0.50    Types: Cigarettes    Last attempt to quit: 09/28/1959    Years since quitting: 58.5  . Smokeless tobacco: Never Used  Substance and Sexual Activity  . Alcohol use: No    Alcohol/week: 0.0 standard drinks  . Drug use: No  . Sexual activity: Not on file  Lifestyle  . Physical activity:    Days per week: Not on file    Minutes per session: Not on file  . Stress: Not on file  Relationships  . Social connections:    Talks on phone: Not on file    Gets together: Not on file    Attends religious service: Not on file    Active member of club or organization: Not on file    Attends meetings of clubs or organizations: Not on file    Relationship status: Not on file  . Intimate partner violence:    Fear of current or ex partner: Not on file    Emotionally abused: Not on file    Physically abused: Not on file    Forced sexual activity: Not on file  Other Topics Concern  . Not on file  Social History Narrative   Patient is widowed with one child.   Patient is right handed.   Patient has college education.   Patient does not drink caffeine.     Allergies  Allergen Reactions  . Cepacol Shortness Of Breath    Difficulty breathing  . Iodine Shortness Of Breath  . Lactose Intolerance (Gi) Shortness Of Breath    All dairy  Causes shortness of breath  .  Other Hives, Shortness Of Breath and Swelling    Mushrooms (All mushrooms)  . Penicillins Shortness Of Breath and Swelling    Tongue and eyes swell Has patient had a PCN reaction causing immediYES Has patient had a PCN reaction causing severe rash involving mucus membranes or skin necrosis: NO Has patient had a PCN reaction that required hospitalization NO Has  patient had a PCN reaction occurring within the last 10 years:NO If all of the above answers are "NO", then may proceed with Cephalosporin use.  . Sulfamethoxazole   . Symbicort [Budesonide-Formoterol Fumarate] Other (See Comments)    Dry eye per pt.      Outpatient Medications Prior to Visit  Medication Sig Dispense Refill  . albuterol (PROVENTIL HFA;VENTOLIN HFA) 108 (90 Base) MCG/ACT inhaler Inhale 2 puffs into the lungs every 6 (six) hours as needed for wheezing or shortness of breath.    Marland Kitchen albuterol (PROVENTIL) (2.5 MG/3ML) 0.083% nebulizer solution Take 3 mLs (2.5 mg total) by nebulization every 4 (four) hours as needed for wheezing or shortness of breath. 150 mL 5  . Alpha-D-Galactosidase (BEANO PO) Take 1 tablet by mouth as needed (gas and bloating).     . Ascorbic Acid (VITAMIN C) 1000 MG tablet Take 1,000 mg by mouth daily as needed (Cold).     . cholecalciferol (VITAMIN D) 1000 units tablet Take 1,000 Units by mouth daily.    . clopidogrel (PLAVIX) 75 MG tablet Take 75 mg by mouth daily.  1  . cycloSPORINE (RESTASIS) 0.05 % ophthalmic emulsion Place 1 drop into both eyes at bedtime as needed (for dry eyes or irritation). Reported on 07/14/2015    . EPINEPHrine 0.3 mg/0.3 mL IJ SOAJ injection     . losartan-hydrochlorothiazide (HYZAAR) 50-12.5 MG per tablet Take 1 tablet by mouth daily.     . Multiple Vitamin (MULTIVITAMIN) tablet Take 1 tablet by mouth daily.    Marland Kitchen omeprazole (PRILOSEC) 40 MG capsule Take 1 capsule (40 mg total) by mouth daily. 90 capsule 3  . polyethylene glycol (MIRALAX / GLYCOLAX) packet Take 17 g by mouth  2 (two) times daily as needed for mild constipation.    . Probiotic Product (PROBIOTIC PO) Take 1 capsule by mouth 3 (three) times daily after meals.     . simvastatin (ZOCOR) 20 MG tablet Take 1 tablet (20 mg total) by mouth daily at 6 PM. 90 tablet 3   No facility-administered medications prior to visit.          Objective:   Physical Exam Vitals:   04/16/18 0959  BP: 116/74  Pulse: 64  SpO2: 97%  Weight: 137 lb (62.1 kg)  Height: 4' 10.5" (1.486 m)   Gen: Pleasant, elderly woman, in no distress,  normal affect  ENT: No lesions,  mouth clear,  oropharynx clear, no postnasal drip  Neck: No JVD, no stridor  Lungs: No use of accessory muscles, distant, no wheezing  Cardiovascular: RRR, heart sounds normal, no murmur or gallops, no peripheral edema  Musculoskeletal: No deformities, no cyanosis or clubbing  Neuro: alert, non focal  Skin: Warm, no lesions or rashes      Assessment & Plan:  GERD Contributing to some lability in her asthma symptoms but no overt exacerbations, no steroid use, no antibiotics.  She is using albuterol less.  Evaluation underway by Dr Silverio Decamp  Intrinsic asthma Please continue Symbicort 2 puffs twice a day as you have been taking it.  Remember to rinse and gargle after using. Keep your albuterol available to use 2 puffs if needed for shortness of breath, chest tightness, wheezing. Get the flu shot at some point this month. Follow with Dr Lamonte Sakai in 6 months or sooner if you have any problems  Allergic rhinitis Not currently on rx   Baltazar Apo, MD, PhD 04/16/2018, 10:26 AM [facility] [phone] or if no  answer [phone]

## 2018-04-16 NOTE — Assessment & Plan Note (Signed)
Please continue Symbicort 2 puffs twice a day as you have been taking it.  Remember to rinse and gargle after using. Keep your albuterol available to use 2 puffs if needed for shortness of breath, chest tightness, wheezing. Get the flu shot at some point this month. Follow with Dr Lamonte Sakai in 6 months or sooner if you have any problems

## 2018-04-16 NOTE — Assessment & Plan Note (Signed)
Contributing to some lability in her asthma symptoms but no overt exacerbations, no steroid use, no antibiotics.  She is using albuterol less.  Evaluation underway by Dr Silverio Decamp

## 2018-04-16 NOTE — Patient Instructions (Signed)
Please continue Symbicort 2 puffs twice a day as you have been taking it.  Remember to rinse and gargle after using. Keep your albuterol available to use 2 puffs if needed for shortness of breath, chest tightness, wheezing. Continue your omeprazole as you have been taking it. Follow-up with Dr. Silverio Decamp to continue your evaluation for your reflux and swallowing. Get the flu shot at some point this month. Follow with Dr Lamonte Sakai in 6 months or sooner if you have any problems

## 2018-04-17 DIAGNOSIS — H25013 Cortical age-related cataract, bilateral: Secondary | ICD-10-CM | POA: Diagnosis not present

## 2018-04-17 DIAGNOSIS — H16223 Keratoconjunctivitis sicca, not specified as Sjogren's, bilateral: Secondary | ICD-10-CM | POA: Diagnosis not present

## 2018-04-17 DIAGNOSIS — H25043 Posterior subcapsular polar age-related cataract, bilateral: Secondary | ICD-10-CM | POA: Diagnosis not present

## 2018-04-17 DIAGNOSIS — M48062 Spinal stenosis, lumbar region with neurogenic claudication: Secondary | ICD-10-CM | POA: Diagnosis not present

## 2018-04-17 DIAGNOSIS — H2513 Age-related nuclear cataract, bilateral: Secondary | ICD-10-CM | POA: Diagnosis not present

## 2018-04-17 DIAGNOSIS — M4804 Spinal stenosis, thoracic region: Secondary | ICD-10-CM | POA: Diagnosis not present

## 2018-04-18 DIAGNOSIS — J45901 Unspecified asthma with (acute) exacerbation: Secondary | ICD-10-CM | POA: Diagnosis not present

## 2018-05-02 ENCOUNTER — Ambulatory Visit (HOSPITAL_COMMUNITY): Payer: PPO

## 2018-05-06 DIAGNOSIS — M48062 Spinal stenosis, lumbar region with neurogenic claudication: Secondary | ICD-10-CM | POA: Diagnosis not present

## 2018-05-06 DIAGNOSIS — M4804 Spinal stenosis, thoracic region: Secondary | ICD-10-CM | POA: Diagnosis not present

## 2018-05-17 DIAGNOSIS — R6889 Other general symptoms and signs: Secondary | ICD-10-CM | POA: Diagnosis not present

## 2018-05-17 DIAGNOSIS — R531 Weakness: Secondary | ICD-10-CM | POA: Diagnosis not present

## 2018-05-18 DIAGNOSIS — J45901 Unspecified asthma with (acute) exacerbation: Secondary | ICD-10-CM | POA: Diagnosis not present

## 2018-05-22 ENCOUNTER — Encounter: Payer: Self-pay | Admitting: Cardiology

## 2018-05-22 NOTE — Progress Notes (Signed)
Cardiology Office Note   Date:  05/24/2018   ID:  [name], DOB 1935/09/05, MRN [id number]  PCP:  Leighton Ruff, MD  Cardiologist:   No primary care provider on file. Referring:  Leighton Ruff, MD   Chief Complaint  Patient presents with  . Abnormal ECG      History of Present Illness: [name] is a 82 y.o. female who was referred for evaluation of an abnormal EKG.  She saw Dr. Radford Pax two years ago for evaluation of palpitations.  She has had a negative event monitor in the past.  . She was referred because she had an abnormal EKG.  She presented to her primary office because of some chest discomfort but in retrospect this was more of a diffuse muscle ache and joint pains.  She had some bony pain.  She had sore throat and felt tired.  She wondered if she might be getting the flu.  She had an EKG and I was able to see this compared with 2017 EKG and there was a question of some anterior T wave inversion.  However, I reviewed this and this was a very subtle difference and nonspecific.  She went home and took some tumor rec and felt better.  She has not had any new palpitations, presyncope or syncope.  She is not had any substernal chest pressure, neck or arm discomfort.  She is had no weight gain or edema.  She still works doing data entry.  She has a 80 year old nonverbal autistic son who she helps take care of does doing work outside the home with the program.  She does all of her own housework.  Of note there was an echocardiogram some years ago in 2015 around the time of a TIA.  I reviewed this and it was essentially unremarkable   Past Medical History:  Diagnosis Date  . Arthritis   . Asthma   . Blood clotting disorder ([place])   . Cataract   . COPD (chronic obstructive pulmonary disease) ([place])   . Depression   . Diverticulosis 10-2011   Colonoscopy  . GERD (gastroesophageal reflux disease)   . Hemorrhoids, internal   . Hx of colonic polyp   .  Hyperlipemia   . Hypertension   . IBS (irritable bowel syndrome)   . 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   EGD  . TIA (transient ischemic attack)     Past Surgical History:  Procedure Laterality Date  . [place]  . LAPAROSCOPIC CHOLECYSTECTOMY  1995     Current Outpatient Medications  Medication Sig Dispense Refill  . albuterol (PROVENTIL HFA;VENTOLIN HFA) 108 (90 Base) MCG/ACT inhaler Inhale 2 puffs into the lungs every 6 (six) hours as needed for wheezing or shortness of breath.    Marland Kitchen albuterol (PROVENTIL) (2.5 MG/3ML) 0.083% nebulizer solution Take 3 mLs (2.5 mg total) by nebulization every 4 (four) hours as needed for wheezing or shortness of breath. 150 mL 5  . Alpha-D-Galactosidase (BEANO PO) Take 1 tablet by mouth as needed (gas and bloating).     . Ascorbic Acid (VITAMIN C) 1000 MG tablet Take 1,000 mg by mouth daily as needed (Cold).     . cholecalciferol (VITAMIN D) 1000 units tablet Take 1,000 Units by mouth daily.    . clopidogrel (PLAVIX) 75 MG tablet Take 75 mg by mouth daily.  1  . cycloSPORINE (RESTASIS) 0.05 % ophthalmic emulsion Place 1 drop into both eyes at bedtime as needed (for dry  eyes or irritation). Reported on 07/14/2015    . EPINEPHrine 0.3 mg/0.3 mL IJ SOAJ injection     . losartan-hydrochlorothiazide (HYZAAR) 50-12.5 MG per tablet Take 1 tablet by mouth daily.     . Multiple Vitamin (MULTIVITAMIN) tablet Take 1 tablet by mouth daily.    Marland Kitchen omeprazole (PRILOSEC) 40 MG capsule Take 1 capsule (40 mg total) by mouth daily. 90 capsule 3  . polyethylene glycol (MIRALAX / GLYCOLAX) packet Take 17 g by mouth 2 (two) times daily as needed for mild constipation.    . Probiotic Product (PROBIOTIC PO) Take 1 capsule by mouth 3 (three) times daily after meals.     . simvastatin (ZOCOR) 20 MG tablet Take 1 tablet (20 mg total) by mouth daily at 6 PM. 90 tablet 3   No current facility-administered medications for this visit.     Allergies:   Cepacol; Contrast  media [iodinated diagnostic agents]; Iodine; Lactose intolerance (gi); Other; Penicillins; Sulfamethoxazole; and Symbicort [budesonide-formoterol fumarate]    ROS:  Please see the history of present illness.   Otherwise, review of systems are positive for none.   All other systems are reviewed and negative.    PHYSICAL EXAM: VS:  BP 132/60 (BP Location: Left Arm, Patient Position: Sitting, Cuff Size: Normal)   Pulse 78   Ht 4\' 11"  (1.499 m)   Wt 133 lb 3.2 oz (60.4 kg)   BMI 26.90 kg/m  , BMI Body mass index is 26.9 kg/m. GENERAL:  Well appearing, she looks much younger than her stated age 89:  Pupils equal round and reactive, fundi not visualized, oral mucosa unremarkable NECK:  No jugular venous distention, waveform within normal limits, carotid upstroke brisk and symmetric, no bruits, no thyromegaly LYMPHATICS:  No cervical, inguinal adenopathy LUNGS:  Clear to auscultation bilaterally BACK:  No CVA tenderness CHEST:  Unremarkable HEART:  PMI not displaced or sustained,S1 and S2 within normal limits, no S3, no S4, no clicks, no rubs, soft apical nonradiating systolic murmur, no diastolic murmurs ABD:  Flat, positive bowel sounds normal in frequency in pitch, no bruits, no rebound, no guarding, no midline pulsatile mass, no hepatomegaly, no splenomegaly EXT:  2 plus pulses throughout, no edema, no cyanosis no clubbing SKIN:  No rashes no nodules NEURO:  Cranial nerves II through XII grossly intact, motor grossly intact throughout PSYCH:  Cognitively intact, oriented to person place and time    EKG:  EKG is ordered today. The ekg ordered today demonstrates sinus rhythm, rate 78, axis within normal limits, intervals within normal limits, poor anterior R wave progression, no acute ST-T wave changes.   Recent Labs: No results found for requested labs within last 8760 hours.    Lipid Panel    Component Value Date/Time   CHOL 187 03/13/2014 2314   TRIG 59 03/13/2014 2314    HDL 57 03/13/2014 2314   CHOLHDL 3.3 03/13/2014 2314   VLDL 12 03/13/2014 2314   LDLCALC 118 (H) 03/13/2014 2314      Wt Readings from Last 3 Encounters:  05/24/18 133 lb 3.2 oz (60.4 kg)  04/16/18 137 lb (62.1 kg)  04/11/18 140 lb 2 oz (63.6 kg)      Other studies Reviewed: Additional studies/ records that were reviewed today include: Office records, echo report 2015, Holter report. Review of the above records demonstrates:  Please see elsewhere in the note.     ASSESSMENT AND PLAN:  CHEST PAIN: Her chest pain was atypical and resolved.  There were  no acute EKG changes.  Her pretest probability of obstructive coronary disease is low.  I do not think any further cardiovascular testing is suggested.  She should continue with aggressive primary risk reduction.  ABNORMAL EKG:  As above.   DYSLIPIDEMIA: LDL was 132.  HDL 59.  In the absence of any coronary disease I would not suggest change in therapy.   Current medicines are reviewed at length with the patient today.  The patient does not have concerns regarding medicines.  The following changes have been made:  no change  Labs/ tests ordered today include: None No orders of the defined types were placed in this encounter.    Disposition:   FU with me as needed.      Signed, Minus Breeding, MD  05/24/2018 10:24 AM    [facility]

## 2018-05-24 ENCOUNTER — Ambulatory Visit (INDEPENDENT_AMBULATORY_CARE_PROVIDER_SITE_OTHER): Payer: PPO | Admitting: Cardiology

## 2018-05-24 ENCOUNTER — Encounter: Payer: Self-pay | Admitting: Cardiology

## 2018-05-24 VITALS — BP 132/60 | HR 78 | Ht 59.0 in | Wt 133.2 lb

## 2018-05-24 DIAGNOSIS — R9431 Abnormal electrocardiogram [ECG] [EKG]: Secondary | ICD-10-CM | POA: Diagnosis not present

## 2018-05-24 DIAGNOSIS — R079 Chest pain, unspecified: Secondary | ICD-10-CM

## 2018-05-24 DIAGNOSIS — E785 Hyperlipidemia, unspecified: Secondary | ICD-10-CM

## 2018-05-24 NOTE — Patient Instructions (Signed)

## 2018-05-24 NOTE — Addendum Note (Signed)
Addended by: Crissie Reese on: 05/24/2018 11:01 AM   Modules accepted: Orders

## 2018-05-29 ENCOUNTER — Other Ambulatory Visit: Payer: Self-pay

## 2018-05-29 ENCOUNTER — Telehealth: Payer: Self-pay | Admitting: Gastroenterology

## 2018-05-29 NOTE — Telephone Encounter (Signed)
Letter faxed to the PCP Dr Drema Dallas at [facility] regarding holding the Plavix.

## 2018-05-29 NOTE — Telephone Encounter (Signed)
Pt called stating that she changed her mind regarding colonoscopy. She wants to proceed with colonoscopy now. Is it ok to schedule her directly? She is on plavix but just saw Dr. Silverio Decamp on 04/11/18. Please advise. Thank you.

## 2018-05-29 NOTE — Telephone Encounter (Signed)
Patient says her symptoms are unimproved. She wants to go forward with egd/colon.  Okay to schedule in the [place]?

## 2018-05-29 NOTE — Telephone Encounter (Signed)
Okay to schedule at [facility].  Will need to discuss with PMD if okay to hold Plavix for 5 days prior to the procedure

## 2018-06-03 DIAGNOSIS — Z800 Family history of malignant neoplasm of digestive organs: Secondary | ICD-10-CM | POA: Diagnosis not present

## 2018-06-03 DIAGNOSIS — Z23 Encounter for immunization: Secondary | ICD-10-CM | POA: Diagnosis not present

## 2018-06-03 DIAGNOSIS — Z8673 Personal history of transient ischemic attack (TIA), and cerebral infarction without residual deficits: Secondary | ICD-10-CM | POA: Diagnosis not present

## 2018-06-06 DIAGNOSIS — Z1211 Encounter for screening for malignant neoplasm of colon: Secondary | ICD-10-CM | POA: Diagnosis not present

## 2018-06-06 DIAGNOSIS — Z1212 Encounter for screening for malignant neoplasm of rectum: Secondary | ICD-10-CM | POA: Diagnosis not present

## 2018-06-07 ENCOUNTER — Telehealth: Payer: Self-pay

## 2018-06-07 NOTE — Telephone Encounter (Signed)
Spoke with [place] at [facility]. Faxed letter to [phone]. Tammy will route the letter to the nurse. Thanked her for the assistance.

## 2018-06-07 NOTE — Telephone Encounter (Signed)
[facility] Pre-operative Risk Assessment     Request for surgical clearance:     Endoscopy Procedure  What type of surgery is being performed?     EGD and colonoscopy  When is this surgery scheduled?     Pending clearance  What type of clearance is required ?   Pharmacy  Are there any medications that need to be held prior to surgery and how long? Plavix 5 to 7 days  Practice name and name of physician performing surgery?      [facility]  What is your office phone and fax number?      Phone- [phone]  [phone]  Anesthesia type (None, local, MAC, general) ?       MAC

## 2018-06-07 NOTE — Telephone Encounter (Signed)
Dr Drema Dallas defers to Cardiology. I will rewrite the telephone encounter.

## 2018-06-11 NOTE — Telephone Encounter (Signed)
   Primary Cardiologist: No primary care provider on file.  Chart reviewed as part of pre-operative protocol coverage. Past medical history reviewed. This patient is not on Plavix for a cardiac issue. It is not managed by our office. It may be prescribed for history of TIA. Please contact primary care provider regarding holding Plavix.   I will route this recommendation to the requesting party via Epic fax function and remove from pre-op pool.  Please call with questions.  Daune Perch, NP 06/11/2018, 8:00 AM

## 2018-06-17 NOTE — Telephone Encounter (Signed)
Submitted to PCP again with explanation.

## 2018-06-18 DIAGNOSIS — J45901 Unspecified asthma with (acute) exacerbation: Secondary | ICD-10-CM | POA: Diagnosis not present

## 2018-06-19 DIAGNOSIS — J45901 Unspecified asthma with (acute) exacerbation: Secondary | ICD-10-CM | POA: Diagnosis not present

## 2018-06-20 NOTE — Telephone Encounter (Signed)
Spoke with the patient.  Dr Drema Dallas is sending the patient back to [facility] to discuss Plavix. She also had the patient do the Cologuard test. Her results came back negative. She will see Neurology 07/22/18.  Patient agrees to keep [place] informed of the recommendations.

## 2018-06-24 NOTE — Telephone Encounter (Signed)
Ok thanks 

## 2018-06-25 ENCOUNTER — Emergency Department (HOSPITAL_COMMUNITY): Payer: PPO

## 2018-06-25 ENCOUNTER — Encounter (HOSPITAL_COMMUNITY): Payer: Self-pay

## 2018-06-25 ENCOUNTER — Emergency Department (HOSPITAL_COMMUNITY)
Admission: EM | Admit: 2018-06-25 | Discharge: 2018-06-25 | Disposition: A | Discharge status: Home / Self Care | Payer: PPO | Attending: Emergency Medicine | Admitting: Emergency Medicine

## 2018-06-25 DIAGNOSIS — J449 Chronic obstructive pulmonary disease, unspecified: Secondary | ICD-10-CM | POA: Insufficient documentation

## 2018-06-25 DIAGNOSIS — I10 Essential (primary) hypertension: Secondary | ICD-10-CM | POA: Insufficient documentation

## 2018-06-25 DIAGNOSIS — R079 Chest pain, unspecified: Secondary | ICD-10-CM | POA: Diagnosis not present

## 2018-06-25 DIAGNOSIS — M2550 Pain in unspecified joint: Secondary | ICD-10-CM | POA: Diagnosis not present

## 2018-06-25 DIAGNOSIS — Z79899 Other long term (current) drug therapy: Secondary | ICD-10-CM | POA: Diagnosis not present

## 2018-06-25 DIAGNOSIS — Z87891 Personal history of nicotine dependence: Secondary | ICD-10-CM | POA: Diagnosis not present

## 2018-06-25 DIAGNOSIS — M791 Myalgia, unspecified site: Secondary | ICD-10-CM | POA: Insufficient documentation

## 2018-06-25 LAB — CBC
HCT: 37.6 % (ref 36.0–46.0)
Hemoglobin: 12.5 g/dL (ref 12.0–15.0)
MCH: 30.4 pg (ref 26.0–34.0)
MCHC: 33.2 g/dL (ref 30.0–36.0)
MCV: 91.5 fL (ref 80.0–100.0)
Platelets: 306 10*3/uL (ref 150–400)
RBC: 4.11 MIL/uL (ref 3.87–5.11)
RDW: 13.5 % (ref 11.5–15.5)
WBC: 3.7 10*3/uL — ABNORMAL LOW (ref 4.0–10.5)
nRBC: 0 % (ref 0.0–0.2)

## 2018-06-25 LAB — BASIC METABOLIC PANEL
Anion gap: 8 (ref 5–15)
BUN: 11 mg/dL (ref 8–23)
CALCIUM: 9.5 mg/dL (ref 8.9–10.3)
CO2: 29 mmol/L (ref 22–32)
Chloride: 104 mmol/L (ref 98–111)
Creatinine, Ser: 1.19 mg/dL — ABNORMAL HIGH (ref 0.44–1.00)
GFR calc Af Amer: 50 mL/min — ABNORMAL LOW (ref >60)
GFR, EST NON AFRICAN AMERICAN: 43 mL/min — AB (ref >60)
Glucose, Bld: 116 mg/dL — ABNORMAL HIGH (ref 70–99)
Potassium: 3.6 mmol/L (ref 3.5–5.1)
Sodium: 141 mmol/L (ref 135–145)

## 2018-06-25 LAB — I-STAT TROPONIN, ED: TROPONIN I, POC: 0 ng/mL (ref 0.00–0.08)

## 2018-06-25 NOTE — ED Notes (Signed)
Pt discharged from ED; instructions provided; Pt encouraged to return to ED if symptoms worsen and to f/u with PCP; Pt verbalized understanding of all instructions 

## 2018-06-25 NOTE — Discharge Instructions (Addendum)
As discussed, your evaluation today has been largely reassuring.  But, it is important that you monitor your condition carefully, and do not hesitate to return to the ED if you develop new, or concerning changes in your condition.  Otherwise, please follow-up with your physician for appropriate ongoing care.  Please be sure to discuss your medications, and as we discussed, consider medications that are appropriate for cessation.

## 2018-06-25 NOTE — ED Triage Notes (Signed)
Pt reports waking up with central chest pain that comes and goes throughout the day. Pt also reports bilateral arm muscle pain and joint pain. Pt has similar symptoms around thanksgiving and was seen by her PCP and cardiology. Pt saw PCP today and was sent here due to her CPK is 344. Pt a.o, nad noted

## 2018-06-25 NOTE — ED Provider Notes (Signed)
[facility] EMERGENCY DEPARTMENT Provider Note   CSN: [id number] Arrival date & time: 06/25/18  1753     History   Chief Complaint Chief Complaint  Patient presents with  . Chest Pain  . Joint Pain  . Abnormal Lab    HPI [name] is a 83 y.o. female.  HPI  Patient presents with concern of soreness and fatigue. She notes that she is been symptomatic for about 2 months, does not feel particularly different today, though she has had persistency of her symptoms during this illness. She has seen her physician during the beginning of the illness, been referred to cardiology, had reasonable evaluation during each of those visits, had no medication changes. Today, with persistency of her symptoms she went to her physician, and after evaluation, she was notified of occult abnormal value of CK greater than 300, encouraged to go to the emergency department. She denies specific pain anywhere, denies syncope, denies fever, denies cough, denies vomiting. No recent medication changes.  When she notes that she continues to take Plavix and other medications as directed, with the exception of simvastatin, which she never began taking, this is in contrast to her medication list.  Past Medical History:  Diagnosis Date  . Arthritis   . Asthma   . Blood clotting disorder ([place])   . Cataract   . COPD (chronic obstructive pulmonary disease) ([place])   . Depression   . Diverticulosis 10-2011   Colonoscopy  . GERD (gastroesophageal reflux disease)   . Hemorrhoids, internal   . Hx of colonic polyp   . Hyperlipemia   . Hypertension   . IBS (irritable bowel syndrome)   . 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   EGD  . TIA (transient ischemic attack)     Patient Active Problem List   Diagnosis Date Noted  . Dyslipidemia 05/24/2018  . Chest pain 05/24/2018  . Abnormal EKG 05/24/2018  . Pleurisy 09/13/2017  . Neck pain 10/21/2014  . LBP (low back pain) 10/21/2014  . Essential  hypertension 07/15/2014  . HLD (hyperlipidemia) 03/24/2014  . Headache on top of head 03/24/2014  . TIA (transient ischemic attack) 03/13/2014  . Stricture and stenosis of esophagus 12/13/2011  . Family history of malignant neoplasm of gastrointestinal tract 11/01/2011  . Personal history of colonic polyps 11/01/2011  . Allergic rhinitis 09/17/2008  . Intrinsic asthma 09/17/2008  . GERD 09/17/2008  . CHOLECYSTECTOMY, HX OF 09/17/2008    Past Surgical History:  Procedure Laterality Date  . [place]  . LAPAROSCOPIC CHOLECYSTECTOMY  1995     OB History   No obstetric history on file.      Home Medications    Prior to Admission medications   Medication Sig Start Date End Date Taking? Authorizing Provider  albuterol (PROVENTIL HFA;VENTOLIN HFA) 108 (90 Base) MCG/ACT inhaler Inhale 2 puffs into the lungs every 6 (six) hours as needed for wheezing or shortness of breath.   Yes [provider]  albuterol (PROVENTIL) (2.5 MG/3ML) 0.083% nebulizer solution Take 3 mLs (2.5 mg total) by nebulization every 4 (four) hours as needed for wheezing or shortness of breath. 07/18/17  Yes Collene Gobble, MD  Alpha-D-Galactosidase (BEANO PO) Take 1 capsule by mouth as needed (for gas and bloating).    Yes [provider]  Ascorbic Acid (VITAMIN C) 1000 MG tablet Take 1,000 mg by mouth daily as needed (for onset of cold symptoms).    Yes [provider]  budesonide-formoterol (SYMBICORT)  80-4.5 MCG/ACT inhaler Inhale 2 puffs into the lungs 2 (two) times daily.   Yes [provider]  cholecalciferol (VITAMIN D) 1000 units tablet Take 1,000 Units by mouth daily.   Yes [provider]  clopidogrel (PLAVIX) 75 MG tablet Take 75 mg by mouth daily. 09/24/14  Yes [provider]  cycloSPORINE (RESTASIS) 0.05 % ophthalmic emulsion Place 1 drop into both eyes 2 (two) times daily.    Yes [provider]  EPINEPHrine 0.3 mg/0.3 mL IJ SOAJ  injection Inject 0.3 mg into the muscle once as needed (for a severe allergic reaction/use as directed).  02/01/18  Yes [provider]  losartan-hydrochlorothiazide (HYZAAR) 50-12.5 MG per tablet Take 1 tablet by mouth daily.  09/01/11  Yes [provider]  Multiple Vitamin (MULTIVITAMIN) tablet Take 1 tablet by mouth 3 (three) times a week.    Yes [provider]  omeprazole (PRILOSEC) 40 MG capsule Take 1 capsule (40 mg total) by mouth daily. 04/11/18  Yes Nandigam, Venia Minks, MD  polyethylene glycol (MIRALAX / GLYCOLAX) packet Take 17 g by mouth 2 (two) times daily as needed for mild constipation.   Yes [provider]  Probiotic Product (PROBIOTIC PO) Take 1 capsule by mouth See admin instructions. Take 1 capsule by mouth one to two times a day   Yes [provider]  simvastatin (ZOCOR) 20 MG tablet Take 1 tablet (20 mg total) by mouth daily at 6 PM. Patient not taking: Reported on 06/25/2018 07/15/14   Rosalin Hawking, MD    Family History Family History  Problem Relation Age of Onset  . Heart disease Mother   . Hypertension Mother   . Diabetes Mother   . Asthma Father   . Colon cancer Sister 78  . Irritable bowel syndrome Sister   . Breast cancer Other        Niece  . Stomach cancer Neg Hx     Social History Social History   Tobacco Use  . Smoking status: Former Smoker    Packs/day: 0.50    Types: Cigarettes    Last attempt to quit: 09/28/1959    Years since quitting: 58.7  . Smokeless tobacco: Never Used  Substance Use Topics  . Alcohol use: No    Alcohol/week: 0.0 standard drinks  . Drug use: No     Allergies   Cepacol; Contrast media [iodinated diagnostic agents]; Iodine; Lactose intolerance (gi); Mushroom extract complex; Other; Penicillins; Sulfamethoxazole; and Symbicort [budesonide-formoterol fumarate]   Review of Systems Review of Systems   Physical Exam Updated Vital Signs BP 114/69 (BP Location: Right Arm)   Pulse  66   Temp 98 F (36.7 C) (Oral)   Resp (!) 26   SpO2 100%   Physical Exam Vitals signs and nursing note reviewed.  Constitutional:      General: She is not in acute distress.    Appearance: She is well-developed.  HENT:     Head: Normocephalic and atraumatic.  Eyes:     Conjunctiva/sclera: Conjunctivae normal.  Cardiovascular:     Rate and Rhythm: Normal rate and regular rhythm.  Pulmonary:     Effort: Pulmonary effort is normal. No respiratory distress.     Breath sounds: Normal breath sounds. No stridor.  Abdominal:     General: There is no distension.  Skin:    General: Skin is warm and dry.  Neurological:     Mental Status: She is alert and oriented to person, place, and time.  Cranial Nerves: No cranial nerve deficit.      ED Treatments / Results  Labs (all labs ordered are listed, but only abnormal results are displayed) Labs Reviewed  BASIC METABOLIC PANEL - Abnormal; Notable for the following components:      Result Value   Glucose, Bld 116 (*)    Creatinine, Ser 1.19 (*)    GFR calc non Af Amer 43 (*)    GFR calc Af Amer 50 (*)    All other components within normal limits  CBC - Abnormal; Notable for the following components:   WBC 3.7 (*)    All other components within normal limits  I-STAT TROPONIN, ED    EKG EKG Interpretation  Date/Time:  Tuesday June 25 2018 18:00:19 EST Ventricular Rate:  92 PR Interval:  150 QRS Duration: 58 QT Interval:  348 QTC Calculation: 430 R Axis:   74 Text Interpretation:  Normal sinus rhythm Low voltage QRS Septal infarct , age undetermined Abnormal ECG No STEMI.  Confirmed by Nanda Quinton [phone]) on 06/25/2018 6:11:29 PM   Radiology Dg Chest 2 View  Result Date: 06/25/2018 CLINICAL DATA:  Chest pain. Mid chest pain radiating into both arms. EXAM: CHEST - 2 VIEW COMPARISON:  09/13/2017 FINDINGS: Chronic hyperinflation.The cardiomediastinal contours are normal. The lungs are clear. Pulmonary vasculature is  normal. No consolidation, pleural effusion, or pneumothorax. No acute osseous abnormalities are seen. Degenerative change in the midthoracic spine. EKG leads project over the chest. IMPRESSION: Chronic hyperinflation without acute abnormality. Electronically Signed   By: Keith Rake M.D.   On: 06/25/2018 19:52    Procedures Procedures (including critical care time)  Medications Ordered in ED Medications - No data to display   Initial Impression / Assessment and Plan / ED Course  I have reviewed the triage vital signs and the nursing notes.  Pertinent labs & imaging results that were available during my care of the patient were reviewed by me and considered in my medical decision making (see chart for details).  On repeat exam the patient is awake and alert, remains hemodynamically unremarkable, stable, with no new complaints. We discussed findings, which are reassuring aside from some borderline evidence for dehydration, with creatinine 1.19 Given her reported history of CK value greater than 300, dehydration may be contributing in part to her soreness Patient is drinking fluids here, is appropriate for discharge with outpatient fluid resuscitation orally, which she is aware of, understands the importance of drinking substantial fluids. Symptoms may be secondary to medication effect, versus viral illness, patient encouraged to follow-up with primary care to discuss her medications, consider appropriate once for cessation. Patient discharged in stable condition.  Final Clinical Impressions(s) / ED Diagnoses   Final diagnoses:  Muscle soreness     Carmin Muskrat, MD 06/25/18 2342

## 2018-06-26 ENCOUNTER — Encounter (HOSPITAL_COMMUNITY): Payer: Self-pay

## 2018-06-26 ENCOUNTER — Emergency Department (HOSPITAL_COMMUNITY)
Admission: EM | Admit: 2018-06-26 | Discharge: 2018-06-27 | Disposition: A | Discharge status: Home / Self Care | Payer: PPO | Attending: Emergency Medicine | Admitting: Emergency Medicine

## 2018-06-26 DIAGNOSIS — Z7901 Long term (current) use of anticoagulants: Secondary | ICD-10-CM | POA: Diagnosis not present

## 2018-06-26 DIAGNOSIS — M542 Cervicalgia: Secondary | ICD-10-CM

## 2018-06-26 DIAGNOSIS — I10 Essential (primary) hypertension: Secondary | ICD-10-CM | POA: Diagnosis not present

## 2018-06-26 DIAGNOSIS — Z87891 Personal history of nicotine dependence: Secondary | ICD-10-CM | POA: Diagnosis not present

## 2018-06-26 DIAGNOSIS — R0789 Other chest pain: Secondary | ICD-10-CM | POA: Diagnosis not present

## 2018-06-26 DIAGNOSIS — J449 Chronic obstructive pulmonary disease, unspecified: Secondary | ICD-10-CM | POA: Insufficient documentation

## 2018-06-26 DIAGNOSIS — M79601 Pain in right arm: Secondary | ICD-10-CM | POA: Insufficient documentation

## 2018-06-26 DIAGNOSIS — M25519 Pain in unspecified shoulder: Secondary | ICD-10-CM | POA: Diagnosis not present

## 2018-06-26 DIAGNOSIS — R079 Chest pain, unspecified: Secondary | ICD-10-CM | POA: Diagnosis not present

## 2018-06-26 LAB — CBC WITH DIFFERENTIAL/PLATELET
Abs Immature Granulocytes: 0.01 10*3/uL (ref 0.00–0.07)
Basophils Absolute: 0 10*3/uL (ref 0.0–0.1)
Basophils Relative: 0 %
Eosinophils Absolute: 0.1 10*3/uL (ref 0.0–0.5)
Eosinophils Relative: 2 %
HCT: 37.3 % (ref 36.0–46.0)
Hemoglobin: 12.2 g/dL (ref 12.0–15.0)
Immature Granulocytes: 0 %
Lymphocytes Relative: 29 %
Lymphs Abs: 1.3 10*3/uL (ref 0.7–4.0)
MCH: 29.5 pg (ref 26.0–34.0)
MCHC: 32.7 g/dL (ref 30.0–36.0)
MCV: 90.1 fL (ref 80.0–100.0)
Monocytes Absolute: 0.5 10*3/uL (ref 0.1–1.0)
Monocytes Relative: 12 %
Neutro Abs: 2.4 10*3/uL (ref 1.7–7.7)
Neutrophils Relative %: 57 %
Platelets: 264 10*3/uL (ref 150–400)
RBC: 4.14 MIL/uL (ref 3.87–5.11)
RDW: 13.4 % (ref 11.5–15.5)
WBC: 4.4 10*3/uL (ref 4.0–10.5)
nRBC: 0 % (ref 0.0–0.2)

## 2018-06-26 MED ORDER — LORAZEPAM 1 MG PO TABS
1.0000 mg | ORAL_TABLET | Freq: Once | ORAL | Status: AC
  Administered 2018-06-26: 1 mg via ORAL
  Filled 2018-06-26: qty 1

## 2018-06-26 NOTE — ED Provider Notes (Signed)
[facility] EMERGENCY DEPARTMENT Provider Note   CSN: [id number] Arrival date & time: 06/26/18  2134     History   Chief Complaint Chief Complaint  Patient presents with  . Arm Pain    HPI [name] is a 83 y.o. female.  HPI   83 year old female with left arm and neck pain.  Acute onset earlier today.  Pain is sharp.  It radiates from her neck down into her hand.  Symptoms worse with certain movements but not all the time.  No weakness.  No headaches.  No acute respiratory complaints.  No unusual leg pain or swelling.  Past Medical History:  Diagnosis Date  . Arthritis   . Asthma   . Blood clotting disorder ([place])   . Cataract   . COPD (chronic obstructive pulmonary disease) ([place])   . Depression   . Diverticulosis 10-2011   Colonoscopy  . GERD (gastroesophageal reflux disease)   . Hemorrhoids, internal   . Hx of colonic polyp   . Hyperlipemia   . Hypertension   . IBS (irritable bowel syndrome)   . 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   EGD  . TIA (transient ischemic attack)     Patient Active Problem List   Diagnosis Date Noted  . Dyslipidemia 05/24/2018  . Chest pain 05/24/2018  . Abnormal EKG 05/24/2018  . Pleurisy 09/13/2017  . Neck pain 10/21/2014  . LBP (low back pain) 10/21/2014  . Essential hypertension 07/15/2014  . HLD (hyperlipidemia) 03/24/2014  . Headache on top of head 03/24/2014  . TIA (transient ischemic attack) 03/13/2014  . Stricture and stenosis of esophagus 12/13/2011  . Family history of malignant neoplasm of gastrointestinal tract 11/01/2011  . Personal history of colonic polyps 11/01/2011  . Allergic rhinitis 09/17/2008  . Intrinsic asthma 09/17/2008  . GERD 09/17/2008  . CHOLECYSTECTOMY, HX OF 09/17/2008    Past Surgical History:  Procedure Laterality Date  . [place]  . LAPAROSCOPIC CHOLECYSTECTOMY  1995     OB History   No obstetric history on file.      Home Medications    Prior to  Admission medications   Medication Sig Start Date End Date Taking? Authorizing Provider  albuterol (PROVENTIL HFA;VENTOLIN HFA) 108 (90 Base) MCG/ACT inhaler Inhale 2 puffs into the lungs every 6 (six) hours as needed for wheezing or shortness of breath.   Yes [provider]  albuterol (PROVENTIL) (2.5 MG/3ML) 0.083% nebulizer solution Take 3 mLs (2.5 mg total) by nebulization every 4 (four) hours as needed for wheezing or shortness of breath. 07/18/17  Yes Collene Gobble, MD  Alpha-D-Galactosidase (BEANO PO) Take 1 capsule by mouth as needed (for gas and bloating).    Yes [provider]  Ascorbic Acid (VITAMIN C) 1000 MG tablet Take 1,000 mg by mouth daily as needed (for onset of cold symptoms).    Yes [provider]  budesonide-formoterol (SYMBICORT) 80-4.5 MCG/ACT inhaler Inhale 2 puffs into the lungs 2 (two) times daily.   Yes [provider]  cholecalciferol (VITAMIN D) 1000 units tablet Take 1,000 Units by mouth daily.   Yes [provider]  clopidogrel (PLAVIX) 75 MG tablet Take 75 mg by mouth daily. 09/24/14  Yes [provider]  cycloSPORINE (RESTASIS) 0.05 % ophthalmic emulsion Place 1 drop into both eyes 2 (two) times daily.    Yes [provider]  EPINEPHrine 0.3 mg/0.3 mL IJ SOAJ injection Inject 0.3 mg into the muscle once as needed (  for a severe allergic reaction/use as directed).  02/01/18  Yes [provider]  losartan-hydrochlorothiazide (HYZAAR) 50-12.5 MG per tablet Take 1 tablet by mouth daily.  09/01/11  Yes [provider]  Multiple Vitamin (MULTIVITAMIN) tablet Take 1 tablet by mouth 3 (three) times a week.    Yes [provider]  omeprazole (PRILOSEC) 40 MG capsule Take 1 capsule (40 mg total) by mouth daily. 04/11/18  Yes Nandigam, Venia Minks, MD  polyethylene glycol (MIRALAX / GLYCOLAX) packet Take 17 g by mouth 2 (two) times daily as needed for mild constipation.   Yes [provider]  Probiotic Product (PROBIOTIC PO) Take 1 capsule by mouth See admin instructions. Take 1 capsule by mouth one to two times a day   Yes [provider]  simvastatin (ZOCOR) 20 MG tablet Take 1 tablet (20 mg total) by mouth daily at 6 PM. Patient not taking: Reported on 06/25/2018 07/15/14   Rosalin Hawking, MD    Family History Family History  Problem Relation Age of Onset  . Heart disease Mother   . Hypertension Mother   . Diabetes Mother   . Asthma Father   . Colon cancer Sister 77  . Irritable bowel syndrome Sister   . Breast cancer Other        Niece  . Stomach cancer Neg Hx     Social History Social History   Tobacco Use  . Smoking status: Former Smoker    Packs/day: 0.50    Types: Cigarettes    Last attempt to quit: 09/28/1959    Years since quitting: 58.7  . Smokeless tobacco: Never Used  Substance Use Topics  . Alcohol use: No    Alcohol/week: 0.0 standard drinks  . Drug use: No     Allergies   Cepacol; Contrast media [iodinated diagnostic agents]; Iodine; Lactose intolerance (gi); Mushroom extract complex; Other; Penicillins; Sulfamethoxazole; and Symbicort [budesonide-formoterol fumarate]   Review of Systems Review of Systems  All systems reviewed and negative, other than as noted in HPI.  Physical Exam Updated Vital Signs BP 120/70   Pulse 76   Temp 98.4 F (36.9 C) (Oral)   Resp 15   SpO2 97%   Physical Exam Vitals signs and nursing note reviewed.  Constitutional:      General: She is not in acute distress.    Appearance: She is well-developed.  HENT:     Head: Normocephalic and atraumatic.  Eyes:     General:        Right eye: No discharge.        Left eye: No discharge.     Conjunctiva/sclera: Conjunctivae normal.  Neck:     Musculoskeletal: Neck supple.  Cardiovascular:     Rate and Rhythm: Normal rate and regular rhythm.     Heart sounds: Normal heart sounds. No murmur. No friction rub. No gallop.   Pulmonary:      Effort: Pulmonary effort is normal. No respiratory distress.     Breath sounds: Normal breath sounds.  Abdominal:     General: There is no distension.     Palpations: Abdomen is soft.     Tenderness: There is no abdominal tenderness.  Musculoskeletal:        General: No tenderness.  Skin:    General: Skin is warm and dry.  Neurological:     Mental Status: She is alert.  Psychiatric:        Behavior: Behavior normal.  Thought Content: Thought content normal.      ED Treatments / Results  Labs (all labs ordered are listed, but only abnormal results are displayed) Labs Reviewed  BASIC METABOLIC PANEL - Abnormal; Notable for the following components:      Result Value   Potassium 3.3 (*)    All other components within normal limits  CBC WITH DIFFERENTIAL/PLATELET  TROPONIN I    EKG EKG Interpretation  Date/Time:  Wednesday June 26 2018 21:47:13 EST Ventricular Rate:  70 PR Interval:    QRS Duration: 86 QT Interval:  395 QTC Calculation: 427 R Axis:   32 Text Interpretation:  Sinus rhythm Low voltage, precordial leads Baseline wander in lead(s) II III aVF V5 Confirmed by Virgel Manifold [phone]) on 06/26/2018 11:12:58 PM Also confirmed by Virgel Manifold [phone]), editor Philomena Doheny [phone])  on 06/27/2018 7:47:06 AM   Radiology Dg Chest 2 View  Result Date: 06/25/2018 CLINICAL DATA:  Chest pain. Mid chest pain radiating into both arms. EXAM: CHEST - 2 VIEW COMPARISON:  09/13/2017 FINDINGS: Chronic hyperinflation.The cardiomediastinal contours are normal. The lungs are clear. Pulmonary vasculature is normal. No consolidation, pleural effusion, or pneumothorax. No acute osseous abnormalities are seen. Degenerative change in the midthoracic spine. EKG leads project over the chest. IMPRESSION: Chronic hyperinflation without acute abnormality. Electronically Signed   By: Keith Rake M.D.   On: 06/25/2018 19:52    Procedures Procedures (including critical care  time)  Medications Ordered in ED Medications  LORazepam (ATIVAN) tablet 1 mg (1 mg Oral Given 06/26/18 2305)     Initial Impression / Assessment and Plan / ED Course  I have reviewed the triage vital signs and the nursing notes.  Pertinent labs & imaging results that were available during my care of the patient were reviewed by me and considered in my medical decision making (see chart for details).     83 year old female with pain in her left neck and into her left upper extremity.  Question cervical radiculopathy.  Doubt ACS, PE, aortic dissection or dissection of neck vasculature.  Exam nonfocal.  ED work-up fairly unremarkable. It has been determined that no acute conditions requiring further emergency intervention are present at this time. The patient has been advised of the diagnosis and plan. I reviewed any labs and imaging including any potential incidental findings. I have reviewed nursing notes and appropriate previous records. We have discussed signs and symptoms that warrant return to the ED and they are listed in the discharge instructions.      Final Clinical Impressions(s) / ED Diagnoses   Final diagnoses:  Neck pain on left side    ED Discharge Orders    None       Virgel Manifold, MD 07/04/18 [phone]

## 2018-06-26 NOTE — ED Triage Notes (Signed)
Pt comes via [facility] for sudden onset of L arm and neck pain. Seen here yesterday for CP and told to stop taking Plavix and losartan. Pt still took losartan today. Denies CP. Denies SOB/n, symptoms are now resolved.

## 2018-06-27 ENCOUNTER — Other Ambulatory Visit: Payer: Self-pay

## 2018-06-27 DIAGNOSIS — M791 Myalgia, unspecified site: Secondary | ICD-10-CM | POA: Diagnosis not present

## 2018-06-27 DIAGNOSIS — R899 Unspecified abnormal finding in specimens from other organs, systems and tissues: Secondary | ICD-10-CM | POA: Diagnosis not present

## 2018-06-27 DIAGNOSIS — I10 Essential (primary) hypertension: Secondary | ICD-10-CM | POA: Diagnosis not present

## 2018-06-27 LAB — BASIC METABOLIC PANEL
Anion gap: 10 (ref 5–15)
BUN: 13 mg/dL (ref 8–23)
CO2: 23 mmol/L (ref 22–32)
Calcium: 9.4 mg/dL (ref 8.9–10.3)
Chloride: 105 mmol/L (ref 98–111)
Creatinine, Ser: 0.88 mg/dL (ref 0.44–1.00)
GFR calc Af Amer: >60 mL/min (ref >60)
GFR calc non Af Amer: >60 mL/min (ref >60)
Glucose, Bld: 91 mg/dL (ref 70–99)
Potassium: 3.3 mmol/L — ABNORMAL LOW (ref 3.5–5.1)
Sodium: 138 mmol/L (ref 135–145)

## 2018-06-27 LAB — TROPONIN I: Troponin I: <0.03 ng/mL (ref <0.03)

## 2018-06-27 NOTE — Patient Outreach (Signed)
[facility]) Care Management  06/27/2018  ESTEPHANIE HUBBS 12/21/1935 [id number]   Telephone Screen  Referral Date: 06/27/2018 Referral Source: [facility] Dept. Referral Reason: member states she needs assistance with all of her meds but there are two in particular-could not think of names" Insurance: HTA   Outreach attempt # 1 to patient. Spoke briefly with patient who reported that this was not a good time for her to talk right now. She requested RN CM contact info and voiced she would call back later.      Plan: RN CM will make outreach attempt to patient within 3-4 business days.   Enzo Montgomery, RN,BSN,CCM [place] Management Telephonic Care Management Coordinator Direct Phone: [phone] Toll Free: [phone] Fax: [phone]

## 2018-07-01 ENCOUNTER — Other Ambulatory Visit: Payer: Self-pay

## 2018-07-01 ENCOUNTER — Telehealth: Payer: Self-pay | Admitting: Pharmacist

## 2018-07-01 NOTE — Patient Outreach (Signed)
[facility]) Care Management  07/01/2018  CADANCE RAUS 1935/12/17 [id number]   Called patient regarding medication assistance with Symbicort and Restasis but she did not answer the phone. HIPAA compliant message was left on her voicemail.  Plan: Send an unsuccessful contact letter Call patient back with in 2-3 business days.   Elayne Guerin, PharmD, [place] Clinical Pharmacist [phone]

## 2018-07-01 NOTE — Patient Outreach (Signed)
[facility]) Care Management  07/01/2018  DANYELE SMEJKAL 02-10-1936 [id number]   Telephone Screen  Referral Date: 06/27/2018 Referral Source: [facility] Dept. Referral Reason: member states she needs assistance with all of her meds but there are two in particular-could not think of names" Insurance: HTA    Outreach attempt #2 to patient. She voices that she is independent with ADLs/IADLs. She denies any recent falls. Patient has PMH of arthritis, asthma, COPD, blood clotting disease, GERD, HLD, HTN, IS and TIA. She denies needing any assistance managing her chronic conditions. Patient states that she takes about four prescription meds and about five OTC meds. She states that she is having trouble affording her Restasis and Symbicort. She voices that both meds are costing her individually about $90. She denies any issues or concerns managing her meds at this time. [facility] services reviewed and discussed with patient. Verbal consent for services given by patient. She reports that she only needs [place] assistance at this time.     Plan: RN CM will send [facility] pharmacy referral for possible med assistance.    Enzo Montgomery, RN,BSN,CCM [place] Management Telephonic Care Management Coordinator Direct Phone: [phone] Toll Free: [phone] Fax: [phone]

## 2018-07-10 DIAGNOSIS — Z6825 Body mass index (BMI) 25.0-25.9, adult: Secondary | ICD-10-CM | POA: Diagnosis not present

## 2018-07-10 DIAGNOSIS — M25512 Pain in left shoulder: Secondary | ICD-10-CM | POA: Diagnosis not present

## 2018-07-10 DIAGNOSIS — R748 Abnormal levels of other serum enzymes: Secondary | ICD-10-CM | POA: Diagnosis not present

## 2018-07-10 DIAGNOSIS — M2550 Pain in unspecified joint: Secondary | ICD-10-CM | POA: Diagnosis not present

## 2018-07-10 DIAGNOSIS — E663 Overweight: Secondary | ICD-10-CM | POA: Diagnosis not present

## 2018-07-10 DIAGNOSIS — M25511 Pain in right shoulder: Secondary | ICD-10-CM | POA: Diagnosis not present

## 2018-07-12 ENCOUNTER — Telehealth: Payer: Self-pay | Admitting: Emergency Medicine

## 2018-07-12 MED ORDER — PREDNISONE 10 MG PO TABS: ORAL_TABLET | ORAL | 0 refills | Status: DC

## 2018-07-12 MED ORDER — HYDROCODONE-HOMATROPINE 5-1.5 MG/5ML PO SYRP: 5.0000 mL | ORAL_SOLUTION | Freq: Four times a day (QID) | ORAL | 0 refills | Status: DC | PRN

## 2018-07-12 NOTE — Telephone Encounter (Signed)
Spoke with pt and advised rx sent to pharmacy. Nothing further is needed.   

## 2018-07-12 NOTE — Telephone Encounter (Signed)
Primary Pulmonologist: Dr. Lamonte Sakai Last office visit and with whom: 04/16/2018- RB What do we see them for (pulmonary problems): intrinsic asthma, Gastroesophageal reflux disease w/out esophgitis Last OV assessment/plan:   Instructions   Please continue Symbicort 2 puffs twice a day as you have been taking it.  Remember to rinse and gargle after using. Keep your albuterol available to use 2 puffs if needed for shortness of breath, chest tightness, wheezing. Continue your omeprazole as you have been taking it. Follow-up with Dr. Silverio Decamp to continue your evaluation for your reflux and swallowing. Get the flu shot at some point this month. Follow with Dr Lamonte Sakai in 6 months or sooner if you have any        Was appointment offered to patient (explain)?  Yes, Patient declined. She did not want to come out in the rain   Reason for call:  Patient called stating that she had increased SHOB, non productive cough, chest congestion, sore throat, and hoarseness, that started 2 days ago.  She stated that felt feverish last night, but has not check temp.  She has been taking coricidin HBP, using her albuterol inhaler, and albuterol nebs. She is requesting something to be called to [employer]  Will route to Dr. Lamonte Sakai

## 2018-07-12 NOTE — Telephone Encounter (Signed)
Sounds like probable URI, she is at risk for flaring asthma sx.  OK to start pred taper >> 30mg  x 3 days, 20mg  x 3 days, 10mg  x 3 days and then stop She will need to be seen next week to assess progress / improvement. If she worsens over the weekend then she will need to seek care - urgent care or ED

## 2018-07-12 NOTE — Telephone Encounter (Signed)
Spoke with pt, she is ok with taking prednisone taper but she would like a cough medication sent in as well because she has a bad cough. RB please advise. I attempted to advise her to take delsym otc but she wanted a Rx for cough medication.

## 2018-07-12 NOTE — Telephone Encounter (Signed)
I sent a script for hycodan to her pharmacy. Please let her know/

## 2018-07-17 ENCOUNTER — Ambulatory Visit (INDEPENDENT_AMBULATORY_CARE_PROVIDER_SITE_OTHER)
Admission: RE | Admit: 2018-07-17 | Discharge: 2018-07-17 | Disposition: A | Discharge status: Home / Self Care | Payer: PPO | Source: Ambulatory Visit | Attending: Primary Care | Admitting: Primary Care

## 2018-07-17 ENCOUNTER — Encounter: Payer: Self-pay | Admitting: Primary Care

## 2018-07-17 ENCOUNTER — Ambulatory Visit (INDEPENDENT_AMBULATORY_CARE_PROVIDER_SITE_OTHER): Payer: PPO | Admitting: Primary Care

## 2018-07-17 VITALS — BP 118/80 | HR 88 | Temp 98.3°F | Ht 59.0 in | Wt 132.6 lb

## 2018-07-17 DIAGNOSIS — J45909 Unspecified asthma, uncomplicated: Secondary | ICD-10-CM | POA: Diagnosis not present

## 2018-07-17 DIAGNOSIS — J101 Influenza due to other identified influenza virus with other respiratory manifestations: Secondary | ICD-10-CM

## 2018-07-17 DIAGNOSIS — J45901 Unspecified asthma with (acute) exacerbation: Secondary | ICD-10-CM

## 2018-07-17 DIAGNOSIS — R05 Cough: Secondary | ICD-10-CM | POA: Diagnosis not present

## 2018-07-17 LAB — RESPIRATORY VIRUS PANEL
INFLUENZA A RNA: DETECTED — AB
Influenza B RNA: NOT DETECTED
RSV RNA: NOT DETECTED
hMPV: NOT DETECTED

## 2018-07-17 LAB — NITRIC OXIDE: Nitric Oxide: 11

## 2018-07-17 MED ORDER — AZITHROMYCIN 250 MG PO TABS: ORAL_TABLET | ORAL | 0 refills | Status: DC

## 2018-07-17 MED ORDER — FLUTTER DEVI: 0 refills | Status: AC

## 2018-07-17 NOTE — Assessment & Plan Note (Addendum)
-   Mild exacerbation d/t current URI symptoms  - FENO normal - Continue Symbicort BID and prn albuterol nebs - Advise regular mucinex and flutter valve 4x times a day - Check CXR and respiratory viral panel - RX zpack, completed prednisone taper as previously prescribed   - 07/18/2018 States that her breathing was better this morning, she will call office if needs additional prednisone

## 2018-07-17 NOTE — Progress Notes (Addendum)
@Patient  ID: [name], female    DOB: 1935/12/27, 83 y.o.   MRN: [id number]  Chief Complaint  Patient presents with  . Acute Visit    'asthma flaring up' - cough sounds congested but can't get it up - using ProAir and neb but not sleeping well and nauseated    Referring provider: Leighton Ruff, MD  HPI: 83 year old female, former remote smoker. PMH significant for asthma. Patient of Dr. Lamonte Sakai, last seen on 04/16/18.  07/18/2018 Patient presents today for acute visit with complaints of cough x 2 weeks. Associated weakness and chills. Had a fever in the beginning. Continues Symbicort as prescribed. Using Albuterol nebulizer as needed. Reports feeling jittery at night. Has one day of prednisone taper left. She has not taken anything over the counter, states she can't take decongestant d/t hypertension and hx TIA. Also reports that she can not take Delsym or Robitussin cough syrup. Afebrile.    Allergies  Allergen Reactions  . Cepacol Shortness Of Breath    Difficulty breathing  . Contrast Media [Iodinated Diagnostic Agents] Shortness Of Breath  . Iodine Shortness Of Breath  . Lactose Intolerance (Gi) Shortness Of Breath    All dairy  Causes shortness of breath  . Mushroom Extract Complex Shortness Of Breath  . Other Hives, Shortness Of Breath and Swelling    Mushrooms (All mushrooms)  . Penicillins Shortness Of Breath and Swelling    DID THE REACTION INVOLVE: Swelling of the face/tongue/throat, SOB, or low BP? Yes  Sudden or severe rash/hives, skin peeling, or the inside of the mouth or nose? No Did it require medical treatment? No When did it last happen?Over 10 years ago If all above answers are "NO", may proceed with cephalosporin use.   . Sulfamethoxazole Anaphylaxis  . Symbicort [Budesonide-Formoterol Fumarate] Other (See Comments)    Dry eyes resulted    Immunization History  Administered Date(s) Administered  . Influenza Inj Mdck Quad Pf 06/16/2018     Past Medical History:  Diagnosis Date  . Arthritis   . Asthma   . Blood clotting disorder ([place])   . Cataract   . COPD (chronic obstructive pulmonary disease) ([place])   . Depression   . Diverticulosis 10-2011   Colonoscopy  . GERD (gastroesophageal reflux disease)   . Hemorrhoids, internal   . Hx of colonic polyp   . Hyperlipemia   . Hypertension   . IBS (irritable bowel syndrome)   . 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   EGD  . TIA (transient ischemic attack)     Tobacco History: Social History   Tobacco Use  Smoking Status Former Smoker  . Packs/day: 0.50  . Types: Cigarettes  . Last attempt to quit: 09/28/1959  . Years since quitting: 58.8  Smokeless Tobacco Never Used   Counseling given: Not Answered   Outpatient Medications Prior to Visit  Medication Sig Dispense Refill  . albuterol (PROVENTIL HFA;VENTOLIN HFA) 108 (90 Base) MCG/ACT inhaler Inhale 2 puffs into the lungs every 6 (six) hours as needed for wheezing or shortness of breath.    Marland Kitchen albuterol (PROVENTIL) (2.5 MG/3ML) 0.083% nebulizer solution Take 3 mLs (2.5 mg total) by nebulization every 4 (four) hours as needed for wheezing or shortness of breath. 150 mL 5  . Alpha-D-Galactosidase (BEANO PO) Take 1 capsule by mouth as needed (for gas and bloating).     . Ascorbic Acid (VITAMIN C) 1000 MG tablet Take 1,000 mg by mouth daily as needed (for onset of cold symptoms).     Marland Kitchen  budesonide-formoterol (SYMBICORT) 80-4.5 MCG/ACT inhaler Inhale 2 puffs into the lungs 2 (two) times daily.    . cholecalciferol (VITAMIN D) 1000 units tablet Take 1,000 Units by mouth daily.    . clopidogrel (PLAVIX) 75 MG tablet Take 75 mg by mouth daily.  1  . cycloSPORINE (RESTASIS) 0.05 % ophthalmic emulsion Place 1 drop into both eyes 2 (two) times daily.     Marland Kitchen EPINEPHrine 0.3 mg/0.3 mL IJ SOAJ injection Inject 0.3 mg into the muscle once as needed (for a severe allergic reaction/use as directed).     Marland Kitchen HYDROcodone-homatropine (HYCODAN)  5-1.5 MG/5ML syrup Take 5 mLs by mouth every 6 (six) hours as needed for cough. 120 mL 0  . losartan-hydrochlorothiazide (HYZAAR) 50-12.5 MG per tablet Take 1 tablet by mouth daily.     . Multiple Vitamin (MULTIVITAMIN) tablet Take 1 tablet by mouth 3 (three) times a week.     Marland Kitchen omeprazole (PRILOSEC) 40 MG capsule Take 1 capsule (40 mg total) by mouth daily. 90 capsule 3  . polyethylene glycol (MIRALAX / GLYCOLAX) packet Take 17 g by mouth 2 (two) times daily as needed for mild constipation.    . predniSONE (DELTASONE) 10 MG tablet Take 3 tabs for 3 days, 2 for 3 days, 1 for 3 days and stop 18 tablet 0  . Probiotic Product (PROBIOTIC PO) Take 1 capsule by mouth See admin instructions. Take 1 capsule by mouth one to two times a day    . simvastatin (ZOCOR) 20 MG tablet Take 1 tablet (20 mg total) by mouth daily at 6 PM. 90 tablet 3   No facility-administered medications prior to visit.     Review of Systems  Review of Systems  Constitutional: Negative.   HENT: Negative.   Respiratory: Positive for cough and wheezing. Negative for shortness of breath.   Cardiovascular: Negative.   Neurological: Positive for weakness.    Physical Exam  BP 118/80 (BP Location: Right Arm, Patient Position: Sitting, Cuff Size: Normal)   Pulse 88   Temp 98.3 F (36.8 C)   Ht 4\' 11"  (1.499 m)   Wt 132 lb 9.6 oz (60.1 kg)   SpO2 98%   BMI 26.78 kg/m  Physical Exam Constitutional:      General: She is not in acute distress.    Appearance: Normal appearance. She is not ill-appearing.  HENT:     Head: Normocephalic and atraumatic.     Right Ear: Tympanic membrane normal.     Left Ear: Tympanic membrane normal.     Mouth/Throat:     Mouth: Mucous membranes are moist.     Pharynx: Oropharynx is clear.  Neck:     Musculoskeletal: Normal range of motion and neck supple.  Cardiovascular:     Rate and Rhythm: Normal rate and regular rhythm.  Pulmonary:     Effort: Pulmonary effort is normal.      Breath sounds: Rhonchi present.  Musculoskeletal: Normal range of motion.  Skin:    General: Skin is warm and dry.  Neurological:     Mental Status: She is alert.  Psychiatric:        Mood and Affect: Mood normal.        Behavior: Behavior normal.        Thought Content: Thought content normal.        Judgment: Judgment normal.      Lab Results:  CBC    Component Value Date/Time   WBC 4.4 06/26/2018 2300   RBC  4.14 06/26/2018 2300   HGB 12.2 06/26/2018 2300   HCT 37.3 06/26/2018 2300   PLT 264 06/26/2018 2300   MCV 90.1 06/26/2018 2300   MCH 29.5 06/26/2018 2300   MCHC 32.7 06/26/2018 2300   RDW 13.4 06/26/2018 2300   LYMPHSABS 1.3 06/26/2018 2300   MONOABS 0.5 06/26/2018 2300   EOSABS 0.1 06/26/2018 2300   BASOSABS 0.0 06/26/2018 2300    BMET    Component Value Date/Time   NA 138 06/26/2018 2300   K 3.3 (L) 06/26/2018 2300   CL 105 06/26/2018 2300   CO2 23 06/26/2018 2300   GLUCOSE 91 06/26/2018 2300   BUN 13 06/26/2018 2300   CREATININE 0.88 06/26/2018 2300   CALCIUM 9.4 06/26/2018 2300   GFRNONAA >60 06/26/2018 2300   GFRAA >60 06/26/2018 2300    BNP No results found for: BNP  ProBNP No results found for: PROBNP  Imaging: Dg Chest 2 View  Result Date: 07/18/2018 CLINICAL DATA:  Asthma and fever EXAM: CHEST - 2 VIEW COMPARISON:  06/25/2018 FINDINGS: Normal heart size. Lungs are hyperaerated and clear. No pneumothorax. No pleural effusion. IMPRESSION: No active cardiopulmonary disease. Electronically Signed   By: Marybelle Killings M.D.   On: 07/18/2018 08:34   Dg Chest 2 View  Result Date: 06/25/2018 CLINICAL DATA:  Chest pain. Mid chest pain radiating into both arms. EXAM: CHEST - 2 VIEW COMPARISON:  09/13/2017 FINDINGS: Chronic hyperinflation.The cardiomediastinal contours are normal. The lungs are clear. Pulmonary vasculature is normal. No consolidation, pleural effusion, or pneumothorax. No acute osseous abnormalities are seen. Degenerative change in the  midthoracic spine. EKG leads project over the chest. IMPRESSION: Chronic hyperinflation without acute abnormality. Electronically Signed   By: Keith Rake M.D.   On: 06/25/2018 19:52     Assessment & Plan:   Intrinsic asthma - Mild exacerbation d/t current URI symptoms  - FENO normal - Continue Symbicort BID and prn albuterol nebs - Advise regular mucinex and flutter valve 4x times a day - Check CXR and respiratory viral panel - RX zpack, completed prednisone taper as previously prescribed   - 07/18/2018 States that her breathing was better this morning, she will call office if needs additional prednisone   Influenza A - Respiratory viral panel positive for Influenza A - Symptoms started 2 weeks ago, Tamiflu not recommended. She will call her son's PCP for potential prophalytic treatment for him as that he lives with her.  - Encouraged rest, oral fluids, tylenol prn fever and good hand hygiene - Return if symptoms do not improve or worsen in any way    Martyn Ehrich, NP 07/18/2018

## 2018-07-17 NOTE — Patient Instructions (Addendum)
Office testing: FENO was normal  Orders: CXR today Respiratory viral panel   Recommendations: Regular mucinex twice daily  Flutter valve 4 times a day for congestion  Continue Symbicort twice daily Albuterol nebulizer every 4-6 hours for shortness of breath/wheezing   RX: Zpack as prescribed  Complete prednisone taper and then stop

## 2018-07-18 DIAGNOSIS — J101 Influenza due to other identified influenza virus with other respiratory manifestations: Secondary | ICD-10-CM | POA: Insufficient documentation

## 2018-07-18 NOTE — Assessment & Plan Note (Signed)
-   Respiratory viral panel positive for Influenza A - Symptoms started 2 weeks ago, Tamiflu not recommended. She will call her son's PCP for potential prophalytic treatment for him as that he lives with her.  - Encouraged rest, oral fluids, tylenol prn fever and good hand hygiene - Return if symptoms do not improve or worsen in any way

## 2018-07-19 ENCOUNTER — Ambulatory Visit: Payer: Self-pay | Admitting: Pharmacist

## 2018-07-19 ENCOUNTER — Other Ambulatory Visit: Payer: Self-pay | Admitting: Pharmacist

## 2018-07-19 DIAGNOSIS — J45901 Unspecified asthma with (acute) exacerbation: Secondary | ICD-10-CM | POA: Diagnosis not present

## 2018-07-19 NOTE — Patient Outreach (Signed)
[place] [facility]) Care Management  07/19/2018  LYNN RECENDIZ Oct 25, 1935 [id number]   Patient was called regarding medication assistance. She answered the phone but requested that I call her at a later time because she was under the weather with the flu and could not talk. She did not identify HIPAA.  Plan: Call patient back in 1-2 business days.   Elayne Guerin, PharmD, [place] Clinical Pharmacist [phone]   (Patient was referred for medication assistance with Restasis and Symbicort.)

## 2018-07-22 ENCOUNTER — Ambulatory Visit: Payer: PPO | Admitting: Neurology

## 2018-07-22 ENCOUNTER — Encounter: Payer: Self-pay | Admitting: Pharmacist

## 2018-07-22 ENCOUNTER — Other Ambulatory Visit: Payer: Self-pay | Admitting: Pharmacist

## 2018-07-22 NOTE — Patient Outreach (Addendum)
[place] [facility]) Care Management  07/22/2018  [name] [birth year] [id number]   Patient was called regarding medication assistance. She answered the phone  but requested said that she could not talk to me because she had the flu.  She did not identify HIPAA.  Plan: Today's call made the 3rd unsuccessful phone call. Patient was sent an unsuccessful contact letter on 07/01/2018. Since patient said she was under the weather, she will be called one additional time before closing her pharmacy case in 5-7 business days.    Elayne Guerin, PharmD, [place] Clinical Pharmacist [phone]   (Patient was referred for medication assistance with Restasis and Symbicort.)

## 2018-07-23 ENCOUNTER — Encounter: Payer: Self-pay | Admitting: Neurology

## 2018-07-24 ENCOUNTER — Telehealth: Payer: Self-pay | Admitting: Primary Care

## 2018-07-24 NOTE — Telephone Encounter (Signed)
Called to check on patient, she is feeling much better. Breathing easier and no fever.

## 2018-07-24 NOTE — Telephone Encounter (Signed)
-----   Message from Martyn Ehrich, NP sent at 07/17/2018  4:12 PM EST ----- Check on patient

## 2018-07-30 NOTE — Telephone Encounter (Signed)
Patient has a Neurology appointment in April. She missed the appointment in February.

## 2018-08-01 ENCOUNTER — Other Ambulatory Visit: Payer: Self-pay | Admitting: Pharmacist

## 2018-08-01 NOTE — Patient Outreach (Signed)
[facility]) [name]   08/01/2018  [name] [birth year] [id number]  Reason for referral: medication assistance Symbicort  Referral source: Telephonic Nurse Referral medication(s): Symbicort Current insurance: [employer]  HPI:  Allergic Rhinitis, GERD, hypertension, hyperlipidemia, asthma, chronic low back pain and history of TIA.   Objective: Allergies  Allergen Reactions  . Cepacol Shortness Of Breath    Difficulty breathing  . Contrast Media [Iodinated Diagnostic Agents] Shortness Of Breath  . Iodine Shortness Of Breath  . Lactose Intolerance (Gi) Shortness Of Breath    All dairy  Causes shortness of breath  . Mushroom Extract Complex Shortness Of Breath  . Other Hives, Shortness Of Breath and Swelling    Mushrooms (All mushrooms)  . Penicillins Shortness Of Breath and Swelling    DID THE REACTION INVOLVE: Swelling of the face/tongue/throat, SOB, or low BP? Yes  Sudden or severe rash/hives, skin peeling, or the inside of the mouth or nose? No Did it require medical treatment? No When did it last happen?Over 10 years ago If all above answers are "NO", may proceed with cephalosporin use.   . Sulfamethoxazole Anaphylaxis  . Symbicort [Budesonide-Formoterol Fumarate] Other (See Comments)    Dry eyes resulted    Medications Reviewed Today    Reviewed by Elayne Guerin, [facility] (Pharmacist) on 08/01/18 at 1417  Med List Status: <None>  Medication Order Taking? Sig Documenting Provider Last Dose Status Informant  albuterol (PROVENTIL HFA;VENTOLIN HFA) 108 (90 Base) MCG/ACT inhaler [id number] Yes Inhale 2 puffs into the lungs every 6 (six) hours as needed for wheezing or shortness of breath. [provider] Taking Active Self  albuterol (PROVENTIL) (2.5 MG/3ML) 0.083% nebulizer solution [id number] Yes Take 3 mLs (2.5 mg total) by nebulization every 4 (four) hours as needed for wheezing or shortness of breath.  Collene Gobble, MD Taking Active Self  Alpha-D-Galactosidase [facility] PO) [id number] Yes Take 1 capsule by mouth as needed (for gas and bloating).  [provider] Taking Active Self  Ascorbic Acid (VITAMIN C) 1000 MG tablet [id number] Yes Take 1,000 mg by mouth daily as needed (for onset of cold symptoms).  [provider] Taking Active Self       Patient not taking:       Discontinued 08/01/18 1410 (Completed Course)   budesonide-formoterol (SYMBICORT) 80-4.5 MCG/ACT inhaler [id number] Yes Inhale 2 puffs into the lungs 2 (two) times daily. [provider] Taking Active Self  cholecalciferol (VITAMIN D) 1000 units tablet [id number] Yes Take 1,000 Units by mouth daily. [provider] Taking Active Self  clopidogrel (PLAVIX) 75 MG tablet [id number] Yes Take 75 mg by mouth daily. [provider] Taking Active Self           Med Note Duffy Bruce, Legrand Como   Tue Jun 25, 2018 10:33 PM)    cycloSPORINE (RESTASIS) 0.05 % ophthalmic emulsion [id number] Yes Place 1 drop into both eyes 2 (two) times daily.  [provider] Taking Active Self  EPINEPHrine 0.3 mg/0.3 mL IJ SOAJ injection [id number] Yes Inject 0.3 mg into the muscle once as needed (for a severe allergic reaction/use as directed).  [provider] Taking Active Self  hydrochlorothiazide (MICROZIDE) 12.5 MG capsule [id number] Yes TAKE 1 CAPSULE BY MOUTH ONCE DAILY AS DIRECTED [provider] Taking Active   HYDROcodone-homatropine (HYCODAN) 5-1.5 MG/5ML syrup [id number] No Take 5 mLs by mouth every 6 (six) hours as needed for cough.  Patient not taking:  Reported on  08/01/2018   Collene Gobble, MD Not Taking Active   losartan (COZAAR) 50 MG tablet [id number] Yes Take 50 mg by mouth daily. [provider] Taking Active   Multiple Vitamin (MULTIVITAMIN) tablet [id number] Yes Take 1 tablet by mouth 3 (three) times a week.  [provider] Taking Active Self           Med Note  Johnnye Sima, Marko Stai Dec 29, 2012  4:21 AM) .   omeprazole (PRILOSEC) 40 MG capsule [id number] Yes Take 1 capsule (40 mg total) by mouth daily. Mauri Pole, MD Taking Active Self           Med Note Regino Bellow Aug 01, 2018  2:12 PM) PRN        Discontinued 08/01/18 1416 (Patient Preference)        Patient not taking:       Discontinued 08/01/18 1416 (Completed Course)   Probiotic Product (PROBIOTIC PO) [id number] Yes Take 1 capsule by mouth See admin instructions. Take 1 capsule by mouth one to two times a day [provider] Taking Active Self           Med Note Johnnye Sima, Yvetta Coder   Sun Dec 29, 2012  4:21 AM) .   Respiratory Therapy Supplies (FLUTTER) DEVI [id number] Yes Use device 2-3 times a day to break up congestion Martyn Ehrich, NP Taking Active        Patient not taking:       Discontinued 08/01/18 1417 (Patient Preference)            Med Note Duffy Bruce, Legrand Como   Tue Jun 25, 2018 10:50 PM) Patient stated that she heard it caused unfavorable side effects, so she chose to never start it          Assessment:  Drugs sorted by system  Cardiovascular: Clopidogrel, HCTZ, losartan,  Pulmonary/Allergy: Proair, Albuterol Nebulizer Solution, Symbicort, Epi Pen, Flutter Device, hydrocodone/homatropine  Gastrointestinal: Beano, Omeprazole, Probiotic  Vitamins/Minerals/Supplements: Vitamin C, Cholecalciferol, Multiple Vitamin   Miscellaneous: Restasis   Medication Assistance Findings:  Medication assistance needs identified.   -Patient is over income for the Extra Help Program  -She has not spent 3% of her income in out-of-pocket medication expenses YTD to qualify for AZ&Me's program.  -She may qualify to receive Dulera from [place] but was very hesitant to be switched to another medication without "trying it" first. She was also informed about getting Proventil HFA instead of ProAir but said she had tried Proventil before and did not  like it.  -Patient was educated about her Medicare Part D plan including a lengthy discussion about the donut hole.  -Patient was given the HTA contact number to reach out with questions about her dental and vision benefits.  -Patient was instructed to review her EOBs from HTA to see what her "out of pocket" expenditures for medications are cumulatively each month. She was asked to give me a call back when she gets close to spending 3% of her income on medications,   Additional medication assistance options reviewed with patient as warranted:  No other options identified  Plan: Route note to patient's PCP  Close patient's case for now. She was given my contact information to reach back out when and if she spends 3% of her income in medication expenses so the AZ&Me Application can be completed.

## 2018-08-02 ENCOUNTER — Ambulatory Visit: Payer: Self-pay | Admitting: Pharmacist

## 2018-08-02 DIAGNOSIS — E663 Overweight: Secondary | ICD-10-CM | POA: Diagnosis not present

## 2018-08-02 DIAGNOSIS — M25512 Pain in left shoulder: Secondary | ICD-10-CM | POA: Diagnosis not present

## 2018-08-02 DIAGNOSIS — M25511 Pain in right shoulder: Secondary | ICD-10-CM | POA: Diagnosis not present

## 2018-08-02 DIAGNOSIS — M2550 Pain in unspecified joint: Secondary | ICD-10-CM | POA: Diagnosis not present

## 2018-08-02 DIAGNOSIS — R748 Abnormal levels of other serum enzymes: Secondary | ICD-10-CM | POA: Diagnosis not present

## 2018-08-02 DIAGNOSIS — Z6826 Body mass index (BMI) 26.0-26.9, adult: Secondary | ICD-10-CM | POA: Diagnosis not present

## 2018-08-08 DIAGNOSIS — I10 Essential (primary) hypertension: Secondary | ICD-10-CM | POA: Diagnosis not present

## 2018-08-08 DIAGNOSIS — E7800 Pure hypercholesterolemia, unspecified: Secondary | ICD-10-CM | POA: Diagnosis not present

## 2018-08-08 DIAGNOSIS — R7989 Other specified abnormal findings of blood chemistry: Secondary | ICD-10-CM | POA: Diagnosis not present

## 2018-08-08 DIAGNOSIS — Z91030 Bee allergy status: Secondary | ICD-10-CM | POA: Diagnosis not present

## 2018-08-08 DIAGNOSIS — K219 Gastro-esophageal reflux disease without esophagitis: Secondary | ICD-10-CM | POA: Diagnosis not present

## 2018-08-08 DIAGNOSIS — J45909 Unspecified asthma, uncomplicated: Secondary | ICD-10-CM | POA: Diagnosis not present

## 2018-08-08 DIAGNOSIS — E559 Vitamin D deficiency, unspecified: Secondary | ICD-10-CM | POA: Diagnosis not present

## 2018-10-08 ENCOUNTER — Ambulatory Visit: Payer: PPO | Admitting: Neurology

## 2018-11-26 ENCOUNTER — Telehealth: Payer: Self-pay | Admitting: *Deleted

## 2018-11-26 NOTE — Telephone Encounter (Signed)
Called pt & LVM asking for call back asap to confirm if she will be coming into the office for her appt tomorrow. Advised we do have a COVID19 screening process such as asking if she has been exposed to anyone positive for covid19 or suspected of having it, asking if she has been having cough, fever, SOB. Advised masks are required and check-in is outside the front door. Advised if we have not heard from her by the end of the day we will plan to cancel appt and she can call back and r/s for next available appt. Left cb number in message.

## 2018-11-26 NOTE — Telephone Encounter (Signed)
Spoke with patient. Pt report being uncomfortable coming into office for visit tomorrow. Pt request late July in-office appt. I scheduled pt for Tues 7/28 @ 1:30 pm. Appt cx for 6/10.

## 2018-11-27 ENCOUNTER — Ambulatory Visit: Payer: PPO | Admitting: Neurology

## 2018-12-16 ENCOUNTER — Telehealth: Payer: Self-pay | Admitting: Primary Care

## 2018-12-16 MED ORDER — PREDNISONE 10 MG PO TABS: ORAL_TABLET | ORAL | 0 refills | Status: DC

## 2018-12-16 NOTE — Telephone Encounter (Signed)
Dr. Lamonte Sakai is in the office, I will send in prednisone taper and forward to him for [facility]

## 2018-12-16 NOTE — Telephone Encounter (Signed)
Primary Pulmonologist: Byrum Last office visit and with whom: Beth What do we see them for (pulmonary problems): Asthma Last OV assessment/plan:  Patient Instructions by Joella Prince, RN at 07/17/2018 3:00 PM Author: Joella Prince, RN Author Type: Registered Nurse Filed: 07/17/2018 3:40 PM  Note Status: Addendum Cosign: Cosign Not Required Encounter Date: 07/17/2018  Editor: Martyn Ehrich, NP (Nurse Practitioner)  Prior Versions: 1. Martyn Ehrich, NP (Nurse Practitioner) at 07/17/2018 3:37 PM - Addendum   2. Joella Prince, RN (Registered Nurse) at 07/17/2018 3:31 PM - Addendum   3. Joella Prince, RN (Registered Nurse) at 07/17/2018 3:29 PM - Signed    Office testing: FENO was normal  Orders: CXR today Respiratory viral panel   Recommendations: Regular mucinex twice daily  Flutter valve 4 times a day for congestion  Continue Symbicort twice daily Albuterol nebulizer every 4-6 hours for shortness of breath/wheezing   RX: Zpack as prescribed  Complete prednisone taper and then stop       Was appointment offered to patient (explain)?     Reason for call: Spoke with pt, she is requesting prednisone to be sent in for SOB. She is using the albuterol inhaler but its not working and she feels like she is using it too much. She hasn't been around anyone with Covid or traveled internationally. She denies fever and cough. She has some wheezing but its not too bad. She denies using any OTC medications for her symptoms. She states the prednisone is what helped her breathing the last time. Please advise if we can send in Rx for pt.

## 2018-12-16 NOTE — Telephone Encounter (Signed)
Spoke with pt and relayed recommendation.  Nothing further is needed.

## 2019-01-14 ENCOUNTER — Ambulatory Visit: Payer: Self-pay | Admitting: Neurology

## 2019-02-03 IMAGING — DX DG CHEST 2V
2 series · 2 of 2 positions shown · non-contrast
Comparison: 06/25/2018

CLINICAL DATA: Asthma and fever

EXAM:
CHEST - 2 VIEW

[chest pa]
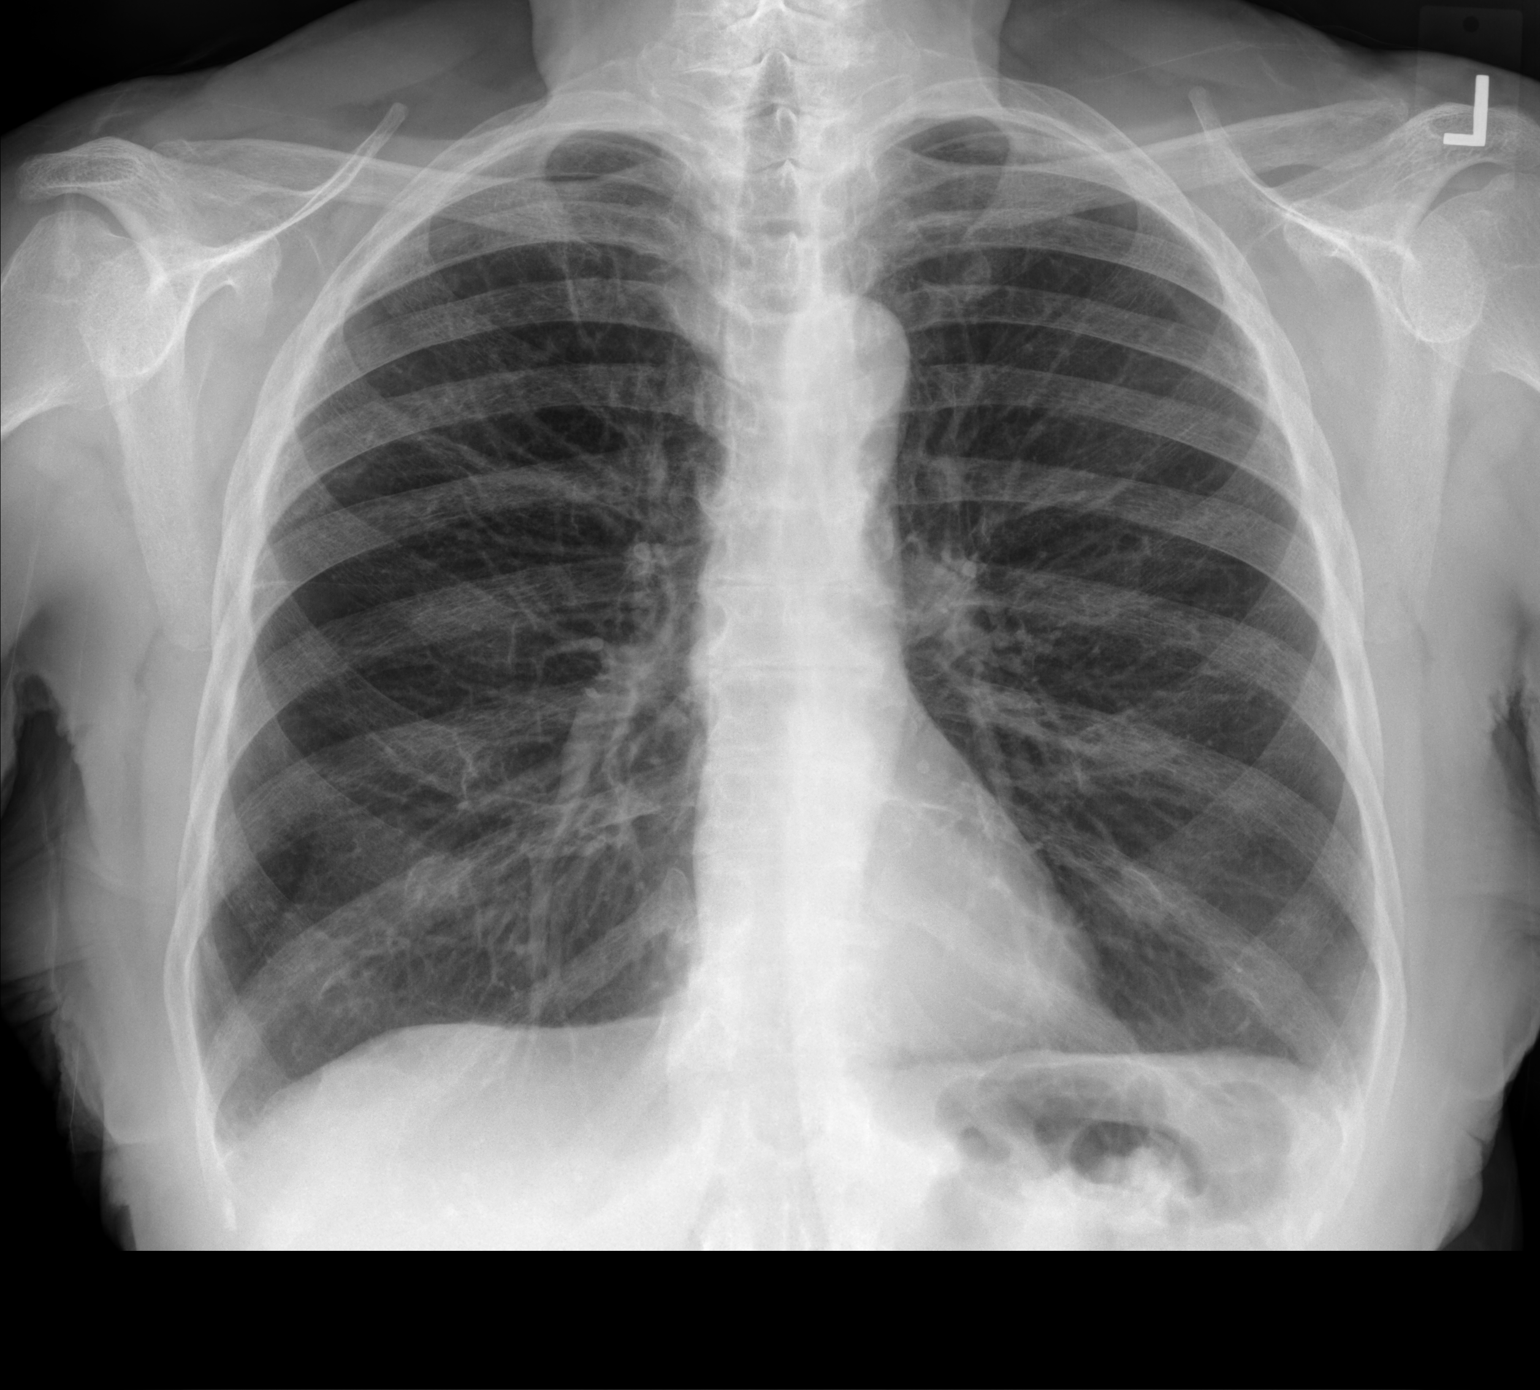

[chest lat]
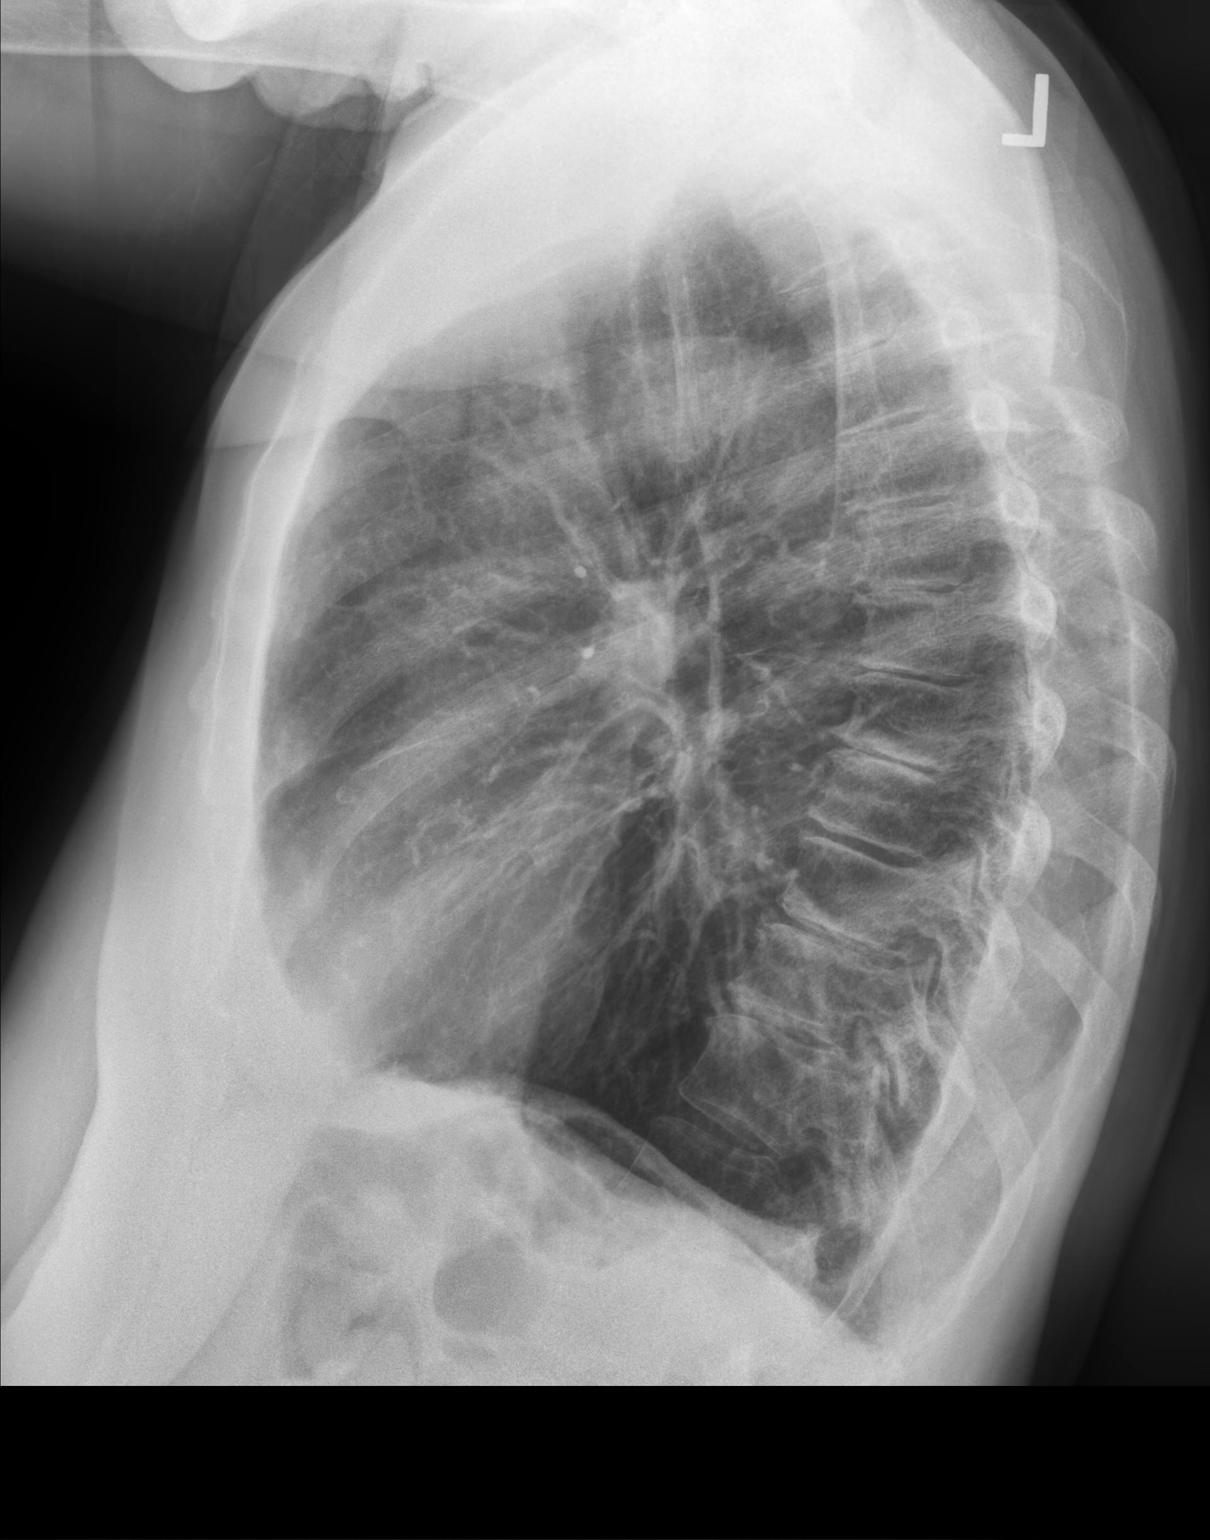

[2 of 2 positions shown; findings below may reference images not displayed]

FINDINGS: Normal heart size. Lungs are hyperaerated and clear. No
pneumothorax. No pleural effusion.
IMPRESSION: No active cardiopulmonary disease.

## 2019-03-05 ENCOUNTER — Ambulatory Visit: Payer: Self-pay | Admitting: Neurology

## 2019-03-17 ENCOUNTER — Ambulatory Visit: Payer: PPO | Admitting: Podiatry

## 2019-03-17 ENCOUNTER — Ambulatory Visit (INDEPENDENT_AMBULATORY_CARE_PROVIDER_SITE_OTHER): Payer: PPO

## 2019-03-17 ENCOUNTER — Encounter: Payer: Self-pay | Admitting: Podiatry

## 2019-03-17 ENCOUNTER — Other Ambulatory Visit: Payer: Self-pay

## 2019-03-17 DIAGNOSIS — M7750 Other enthesopathy of unspecified foot: Secondary | ICD-10-CM | POA: Diagnosis not present

## 2019-03-17 DIAGNOSIS — M7751 Other enthesopathy of right foot: Secondary | ICD-10-CM

## 2019-03-17 DIAGNOSIS — M779 Enthesopathy, unspecified: Secondary | ICD-10-CM

## 2019-03-18 NOTE — Progress Notes (Signed)
Subjective: Patient presents the office today for concerns of right big toe pain.  She states that she was wearing a pair of boots yesterday and she noticed that the right big toe joint, pointing the first MPJ, became swollen and painful hurts to move it but she denies any specific injury yesterday when this started.  She does state that she injured her big toe about 2 months ago when the big toe turned under while stepping off the bed.  The pain is much improved and she points to the hallux IPJ where she has been the majority of tenderness at that time.  That did improve.  This appears to be a new symptom in a different area.  She has not seen any redness or warmth.  No history of gout. Denies any systemic complaints such as fevers, chills, nausea, vomiting. No acute changes since last appointment, and no other complaints at this time.   Objective: AAO x3, NAD DP/PT pulses palpable bilaterally, CRT less than 3 seconds On the right hallux IPJ there is no significant tenderness palpation.  There is pain on the first MPJ there is less edema but there is no significant erythema or warmth.  No open lesions are identified.  No other areas of pinpoint tenderness.  No pain with calf compression, swelling, warmth, erythema  Assessment: Capsulitis right first digit  Plan: -All treatment options discussed with the patient including all alternatives, risks, complications.  -X-rays were obtained reviewed.  Subacute avulsion fracture present of the hallux IPJ.  No evidence of fracture to the MPJ or other areas of the foot. -Steroid injection performed to the first MPJ today.  See procedure note below. -Hallux is asymptomatic.  We will continue to monitor. -Patient encouraged to call the office with any questions, concerns, change in symptoms.   Procedure: Injection Small Joint  Discussed alternatives, risks, complications and verbal consent was obtained.  Location:Right 1st MTPJ Skin Prep: Alcohol   Injectate: 0.5cc 0.5% marcaine plain, 0.5 cc 2% lidocaine plain and, 1 cc kenalog 10. Disposition: Patient tolerated procedure well. Injection site dressed with a band-aid.  Post-injection care was discussed and return precautions discussed.   Trula Slade DPM

## 2019-04-01 DIAGNOSIS — Z23 Encounter for immunization: Secondary | ICD-10-CM | POA: Diagnosis not present

## 2019-04-08 ENCOUNTER — Ambulatory Visit: Payer: PPO | Admitting: Podiatry

## 2019-04-23 DIAGNOSIS — Z0000 Encounter for general adult medical examination without abnormal findings: Secondary | ICD-10-CM | POA: Diagnosis not present

## 2019-04-25 DIAGNOSIS — E559 Vitamin D deficiency, unspecified: Secondary | ICD-10-CM | POA: Diagnosis not present

## 2019-04-25 DIAGNOSIS — J45909 Unspecified asthma, uncomplicated: Secondary | ICD-10-CM | POA: Diagnosis not present

## 2019-04-25 DIAGNOSIS — M8588 Other specified disorders of bone density and structure, other site: Secondary | ICD-10-CM | POA: Diagnosis not present

## 2019-04-25 DIAGNOSIS — Z91030 Bee allergy status: Secondary | ICD-10-CM | POA: Diagnosis not present

## 2019-04-25 DIAGNOSIS — K219 Gastro-esophageal reflux disease without esophagitis: Secondary | ICD-10-CM | POA: Diagnosis not present

## 2019-04-25 DIAGNOSIS — I10 Essential (primary) hypertension: Secondary | ICD-10-CM | POA: Diagnosis not present

## 2019-04-25 DIAGNOSIS — E7800 Pure hypercholesterolemia, unspecified: Secondary | ICD-10-CM | POA: Diagnosis not present

## 2019-04-25 DIAGNOSIS — Z8673 Personal history of transient ischemic attack (TIA), and cerebral infarction without residual deficits: Secondary | ICD-10-CM | POA: Diagnosis not present

## 2019-05-06 DIAGNOSIS — Z91030 Bee allergy status: Secondary | ICD-10-CM | POA: Diagnosis not present

## 2019-05-06 DIAGNOSIS — J45909 Unspecified asthma, uncomplicated: Secondary | ICD-10-CM | POA: Diagnosis not present

## 2019-05-06 DIAGNOSIS — E7800 Pure hypercholesterolemia, unspecified: Secondary | ICD-10-CM | POA: Diagnosis not present

## 2019-05-06 DIAGNOSIS — M8588 Other specified disorders of bone density and structure, other site: Secondary | ICD-10-CM | POA: Diagnosis not present

## 2019-05-06 DIAGNOSIS — K219 Gastro-esophageal reflux disease without esophagitis: Secondary | ICD-10-CM | POA: Diagnosis not present

## 2019-05-06 DIAGNOSIS — E559 Vitamin D deficiency, unspecified: Secondary | ICD-10-CM | POA: Diagnosis not present

## 2019-05-06 DIAGNOSIS — Z8673 Personal history of transient ischemic attack (TIA), and cerebral infarction without residual deficits: Secondary | ICD-10-CM | POA: Diagnosis not present

## 2019-05-06 DIAGNOSIS — I10 Essential (primary) hypertension: Secondary | ICD-10-CM | POA: Diagnosis not present

## 2019-07-15 ENCOUNTER — Telehealth: Payer: Self-pay | Admitting: Emergency Medicine

## 2019-07-15 DIAGNOSIS — Z7189 Other specified counseling: Secondary | ICD-10-CM | POA: Diagnosis not present

## 2019-07-15 DIAGNOSIS — M791 Myalgia, unspecified site: Secondary | ICD-10-CM | POA: Diagnosis not present

## 2019-07-15 NOTE — Telephone Encounter (Signed)
Pt returned call.  Pt can be reached at [phone].

## 2019-07-15 NOTE — Telephone Encounter (Signed)
Spoke with pt, she is having sore/achy muscles and weakness and wants to know if she should still take the covid vaccine tomorrow at 12:15. She wants to know what RB recommends. Please advise.

## 2019-07-15 NOTE — Telephone Encounter (Signed)
I think as long as she is not having breathing issues, asthma symptoms, and as long as her symptoms aren't worse tomorrow then she should go ahead with the COVID vaccine. If she feels worse tomorrow or develops breathing problems then she will have to reschedule.

## 2019-07-15 NOTE — Telephone Encounter (Signed)
I spoke with the pt and notified of recs per Dr Lamonte Sakai and she verbalized understanding

## 2019-07-15 NOTE — Telephone Encounter (Signed)
LMTCB

## 2019-07-15 NOTE — Telephone Encounter (Signed)
ATC pt, no answer. Left message for pt to call back.  

## 2019-07-21 ENCOUNTER — Encounter: Payer: Self-pay | Admitting: Emergency Medicine

## 2019-07-21 ENCOUNTER — Other Ambulatory Visit: Payer: Self-pay

## 2019-07-21 ENCOUNTER — Ambulatory Visit: Payer: PPO | Admitting: Emergency Medicine

## 2019-07-21 DIAGNOSIS — M791 Myalgia, unspecified site: Secondary | ICD-10-CM | POA: Insufficient documentation

## 2019-07-21 DIAGNOSIS — J45909 Unspecified asthma, uncomplicated: Secondary | ICD-10-CM

## 2019-07-21 DIAGNOSIS — B370 Candidal stomatitis: Secondary | ICD-10-CM | POA: Diagnosis not present

## 2019-07-21 MED ORDER — FLUCONAZOLE 100 MG PO TABS: 100.0000 mg | ORAL_TABLET | Freq: Every day | ORAL | 0 refills | Status: DC

## 2019-07-21 MED ORDER — MOMETASONE FURO-FORMOTEROL FUM 200-5 MCG/ACT IN AERO: 2.0000 | INHALATION_SPRAY | Freq: Two times a day (BID) | RESPIRATORY_TRACT | 5 refills | Status: DC

## 2019-07-21 NOTE — Assessment & Plan Note (Signed)
She is on her evaluation for myalgias, fatigue, unexplained weight loss.  Apparently a rheumatology evaluation was reassuring.  I do not know what labs have been done but she is planning to follow-up with Dr. Drema Dallas in 2 weeks.  Some of her symptoms sound suggestive of hypothyroidism.  She will proceed with a work-up with Dr. Drema Dallas.

## 2019-07-21 NOTE — Assessment & Plan Note (Signed)
She is having tongue irritation from the Symbicort.  Question whether she may have thrush versus just a local reaction.  I am going to treat thrush empirically.  In the meantime try changing the Symbicort to [facility] and see if she tolerates better.  She has not tolerated powdered inhalers in the past so will avoid these.  Keep albuterol available to use as needed

## 2019-07-21 NOTE — Progress Notes (Signed)
Subjective:    Patient ID: [name], female    DOB: Jan 10, 1936, 84 y.o.   MRN: [id number]  Asthma Her past medical history is significant for asthma.   ROV 07/21/2019 --[name] is 30, follows up today for her history of COPD with bronchodilator responsiveness and asthmatic component.  She also has intermittent allergic rhinitis, GERD.  Last seen here 1 year ago when she was experiencing flaring symptoms in the setting of influenza A, was treated with a prednisone taper. She improved but it took until March. No other flares since then.  She reports today that she has been having leg, thigh muscle soreness, feels cold, has had weakness, wt loss. She is planning to f/u w [name] about this w labs in 2 weeks.  She is having a lot of tongue irritation with her Symbicort, although she knows that it helps her breathing. Started last year - sometimes her tongue turns red, can be coated with white. Has caused her to change to qod on occasion. Uses albuterol about every other day. She reports dry mouth and dry eyes, ? Contributing to the tongue. She has had cough in past w powdered inhalers.    Review of Systems Per HPI  Past Medical History:  Diagnosis Date  . Arthritis   . Asthma   . Blood clotting disorder ([place])   . Cataract   . COPD (chronic obstructive pulmonary disease) ([place])   . Depression   . Diverticulosis 10-2011   Colonoscopy  . GERD (gastroesophageal reflux disease)   . Hemorrhoids, internal   . Hx of colonic polyp   . Hyperlipemia   . Hypertension   . IBS (irritable bowel syndrome)   . 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   EGD  . TIA (transient ischemic attack)      Family History  Problem Relation Age of Onset  . Heart disease Mother   . Hypertension Mother   . Diabetes Mother   . Asthma Father   . Colon cancer Sister 58  . Irritable bowel syndrome Sister   . Breast cancer Other        Niece  . Stomach cancer Neg Hx      Social History   Socioeconomic  History  . Marital status: Widowed    Spouse name: Not on file  . Number of children: 1  . Years of education: college  . Highest education level: Not on file  Occupational History  . Occupation: retired  Tobacco Use  . Smoking status: Former Smoker    Packs/day: 0.50    Types: Cigarettes    Quit date: 09/28/1959    Years since quitting: 59.8  . Smokeless tobacco: Never Used  Substance and Sexual Activity  . Alcohol use: No    Alcohol/week: 0.0 standard drinks  . Drug use: No  . Sexual activity: Not on file  Other Topics Concern  . Not on file  Social History Narrative   Patient is widowed with one child.   Patient is right handed.   Patient has college education.   Patient does not drink caffeine.   Social Determinants of Health   Financial Resource Strain:   . Difficulty of Paying Living Expenses: Not on file  Food Insecurity:   . Worried About Charity fundraiser in the Last Year: Not on file  . Ran Out of Food in the Last Year: Not on file  Transportation Needs:   . Lack of Transportation (Medical): Not on file  .  Lack of Transportation (Non-Medical): Not on file  Physical Activity:   . Days of Exercise per Week: Not on file  . Minutes of Exercise per Session: Not on file  Stress:   . Feeling of Stress : Not on file  Social Connections:   . Frequency of Communication with Friends and Family: Not on file  . Frequency of Social Gatherings with Friends and Family: Not on file  . Attends Religious Services: Not on file  . Active Member of Clubs or Organizations: Not on file  . Attends Archivist Meetings: Not on file  . Marital Status: Not on file  Intimate Partner Violence:   . Fear of Current or Ex-Partner: Not on file  . Emotionally Abused: Not on file  . Physically Abused: Not on file  . Sexually Abused: Not on file     Allergies  Allergen Reactions  . Cepacol Shortness Of Breath    Difficulty breathing  . Contrast Media [Iodinated Diagnostic  Agents] Shortness Of Breath  . Iodine Shortness Of Breath  . Lactose Intolerance (Gi) Shortness Of Breath    All dairy  Causes shortness of breath  . Mushroom Extract Complex Shortness Of Breath  . Other Hives, Shortness Of Breath and Swelling    Mushrooms (All mushrooms)  . Penicillins Shortness Of Breath and Swelling    DID THE REACTION INVOLVE: Swelling of the face/tongue/throat, SOB, or low BP? Yes  Sudden or severe rash/hives, skin peeling, or the inside of the mouth or nose? No Did it require medical treatment? No When did it last happen?Over 10 years ago If all above answers are "NO", may proceed with cephalosporin use.   . Sulfamethoxazole Anaphylaxis  . Symbicort [Budesonide-Formoterol Fumarate] Other (See Comments)    Dry eyes resulted     Outpatient Medications Prior to Visit  Medication Sig Dispense Refill  . albuterol (PROVENTIL HFA;VENTOLIN HFA) 108 (90 Base) MCG/ACT inhaler Inhale 2 puffs into the lungs every 6 (six) hours as needed for wheezing or shortness of breath.    Marland Kitchen albuterol (PROVENTIL) (2.5 MG/3ML) 0.083% nebulizer solution Take 3 mLs (2.5 mg total) by nebulization every 4 (four) hours as needed for wheezing or shortness of breath. 150 mL 5  . Alpha-D-Galactosidase (BEANO PO) Take 1 capsule by mouth as needed (for gas and bloating).     . Ascorbic Acid (VITAMIN C) 1000 MG tablet Take 1,000 mg by mouth daily as needed (for onset of cold symptoms).     . budesonide-formoterol (SYMBICORT) 80-4.5 MCG/ACT inhaler Inhale 2 puffs into the lungs 2 (two) times daily.    . cholecalciferol (VITAMIN D) 1000 units tablet Take 1,000 Units by mouth daily.    . clopidogrel (PLAVIX) 75 MG tablet Take 75 mg by mouth daily.  1  . cycloSPORINE (RESTASIS) 0.05 % ophthalmic emulsion Place 1 drop into both eyes 2 (two) times daily.     Marland Kitchen EPINEPHrine 0.3 mg/0.3 mL IJ SOAJ injection Inject 0.3 mg into the muscle once as needed (for a severe allergic reaction/use as directed).     .  hydrochlorothiazide (MICROZIDE) 12.5 MG capsule TAKE 1 CAPSULE BY MOUTH ONCE DAILY AS DIRECTED    . losartan (COZAAR) 50 MG tablet Take 50 mg by mouth daily.    . Multiple Vitamin (MULTIVITAMIN) tablet Take 1 tablet by mouth 3 (three) times a week.     Marland Kitchen omeprazole (PRILOSEC) 40 MG capsule Take 1 capsule (40 mg total) by mouth daily. 90 capsule 3  . Probiotic  Product (PROBIOTIC PO) Take 1 capsule by mouth See admin instructions. Take 1 capsule by mouth one to two times a day    . Respiratory Therapy Supplies (FLUTTER) DEVI Use device 2-3 times a day to break up congestion 1 each 0  . HYDROcodone-homatropine (HYCODAN) 5-1.5 MG/5ML syrup Take 5 mLs by mouth every 6 (six) hours as needed for cough. (Patient not taking: Reported on 08/01/2018) 120 mL 0  . predniSONE (DELTASONE) 10 MG tablet Take 4 tabs po daily x 2 days; then 3 tabs for 2 days; then 2 tabs for 2 days; then 1 tab for 2 days 20 tablet 0   No facility-administered medications prior to visit.         Objective:   Physical Exam Vitals:   07/21/19 1128  BP: 110/70  Pulse: 82  SpO2: 100%  Weight: 124 lb (56.2 kg)   Gen: Pleasant, elderly woman, in no distress,  normal affect  ENT: No lesions,  mouth clear, her tongue has some hypopigmentation without plaquing centrally with some raised irritated lesions proximally.  No bleeding or cracking.  Not particularly dry  Neck: No JVD, no stridor  Lungs: No use of accessory muscles, distant, no wheeze or crackles  Cardiovascular: RRR, heart sounds normal, no murmur or gallops, no peripheral edema  Musculoskeletal: No deformities, no cyanosis or clubbing  Neuro: alert, non focal  Skin: Warm, no lesions or rashes      Assessment & Plan:  Intrinsic asthma She is having tongue irritation from the Symbicort.  Question whether she may have thrush versus just a local reaction.  I am going to treat thrush empirically.  In the meantime try changing the Symbicort to [facility] and see if  she tolerates better.  She has not tolerated powdered inhalers in the past so will avoid these.  Keep albuterol available to use as needed  Myalgia She is on her evaluation for myalgias, fatigue, unexplained weight loss.  Apparently a rheumatology evaluation was reassuring.  I do not know what labs have been done but she is planning to follow-up with Barbara. Drema [name] in 2 weeks.  Some of her symptoms sound suggestive of hypothyroidism.  She will proceed with a work-up with Barbara. Drema [name].  Thrush Not a clear-cut diagnosis based on exam, but will treat empirically to see if we can resolve the problem altogether.  It is possible that her Symbicort is causing local irritation   Baltazar Apo, MD, PhD 07/21/2019, 12:00 PM [facility] [phone] or if no answer [phone]

## 2019-07-21 NOTE — Patient Instructions (Addendum)
Please take fluconazole 100 mg once daily for 5 days and then stop. Temporarily stop your Symbicort We will write a prescription for Dulera, 2 puffs twice a day.  Rinse and gargle after you take this medication.  If this medication is less bothersome, causes less tongue irritation then we may decide to stay on it long-term.  Please call us and let us know. Keep your albuterol available to use 2 puffs if needed for shortness of breath, chest tightness, wheezing. Follow with Dr. Drema Dallas as planned in 2 weeks.  Some of your symptoms are concerning for possible low thyroid (hypothyroidism) Agree with getting the COVID-19 vaccine when you are able to schedule this Follow with Dr. Lamonte Sakai in 2 months or sooner if you have any problems.

## 2019-07-21 NOTE — Assessment & Plan Note (Signed)
Not a clear-cut diagnosis based on exam, but will treat empirically to see if we can resolve the problem altogether.  It is possible that her Symbicort is causing local irritation

## 2019-07-23 ENCOUNTER — Telehealth: Payer: Self-pay | Admitting: Emergency Medicine

## 2019-07-23 NOTE — Telephone Encounter (Signed)
Medication name and strength: Dulera 200-5mcg Provider: [place]: Suzie Portela on [address] Patient insurance ID: [id number] Phone: [phone] Fax: N/A  Was the PA started on CMM?  Yes If yes, please enter the Key:  [id number] Timeframe for approval/denial: 24-72 hrs

## 2019-07-24 NOTE — Telephone Encounter (Signed)
Medication name and strength: Dulera 200-24mcg PA approved/denied: Approved  Approval dates: 07/23/19-06/18/20   Pharmacy is aware of approval. Nothing further needed.

## 2019-07-29 ENCOUNTER — Telehealth: Payer: Self-pay | Admitting: Emergency Medicine

## 2019-07-29 MED ORDER — NYSTATIN 100000 UNIT/ML MT SUSP: 5.0000 mL | Freq: Three times a day (TID) | OROMUCOSAL | 0 refills | Status: AC

## 2019-07-29 NOTE — Telephone Encounter (Signed)
See other phone note dated today 

## 2019-07-29 NOTE — Telephone Encounter (Signed)
LMTCB x 1 

## 2019-07-29 NOTE — Telephone Encounter (Signed)
I treated her for thrush with fluconazole.  If this was ineffective then she can try nystatin swish and swallow 20 cc 3 times daily for 5 days

## 2019-07-29 NOTE — Telephone Encounter (Signed)
Spoke with the pt  She states that she was only able to take the fluconazole x 2 days  She states it made her BP go up- 148/82, HA, nose bleed, nausea and dizziness  She stopped taking med and all of these symptoms resolved  She still c/o sore throat, tongue irritation and white coating on her tongue  Please advise thanks

## 2019-07-29 NOTE — Telephone Encounter (Signed)
Pt is returning call - please call home number at [phone]

## 2019-07-29 NOTE — Telephone Encounter (Signed)
Reviewed results with him.  The cytology on the needle and brushings both showed atypical cells.  The biopsies were unrevealing.  I think there is at least moderate to high suspicion that the small nodule is a primary lung cancer.  I will review with the multidisciplinary thoracic oncology conference.  I suspect the correct next steps are to follow CT chest for interval change.  If there is growth of the nodule and in light of his cytology results then would favor empiric treatment.  Will discuss with him further on 08/07/2019 

## 2019-07-29 NOTE — Telephone Encounter (Signed)
LMTCB- need to go over recommendations with her by Dr Lamonte Sakai and verify pharm

## 2019-07-29 NOTE — Telephone Encounter (Signed)
I treated her for thrush with fluconazole.  If this was ineffective then she can try nystatin swish and swallow 20 cc 3 times daily for 5 days  Spoke with the pt and notified of recs per Dr Lamonte Sakai  She verbalized understanding  The rx was sent to [facility]

## 2019-07-29 NOTE — Telephone Encounter (Signed)
Pt returning a phone call. Pt can be reached at [phone].

## 2019-07-29 NOTE — Telephone Encounter (Signed)
Please disregard the note below - was entered for the wrong patient

## 2019-07-29 NOTE — Telephone Encounter (Signed)
Pt calling stating the fluconazole is causing her to have nose bleeds, dizziness, nausea and elevated BP. And she was just wanting to let nurse know. Pt can be reached at  [phone]

## 2019-08-12 DIAGNOSIS — R209 Unspecified disturbances of skin sensation: Secondary | ICD-10-CM | POA: Diagnosis not present

## 2019-08-12 DIAGNOSIS — E7800 Pure hypercholesterolemia, unspecified: Secondary | ICD-10-CM | POA: Diagnosis not present

## 2019-08-12 DIAGNOSIS — R531 Weakness: Secondary | ICD-10-CM | POA: Diagnosis not present

## 2019-08-14 ENCOUNTER — Emergency Department (HOSPITAL_COMMUNITY): Payer: PPO

## 2019-08-14 ENCOUNTER — Encounter (HOSPITAL_COMMUNITY): Payer: Self-pay | Admitting: Emergency Medicine

## 2019-08-14 ENCOUNTER — Emergency Department (HOSPITAL_COMMUNITY)
Admission: EM | Admit: 2019-08-14 | Discharge: 2019-08-15 | Disposition: A | Discharge status: Home / Self Care | Payer: PPO | Attending: Emergency Medicine | Admitting: Emergency Medicine

## 2019-08-14 DIAGNOSIS — R0789 Other chest pain: Secondary | ICD-10-CM | POA: Insufficient documentation

## 2019-08-14 DIAGNOSIS — E559 Vitamin D deficiency, unspecified: Secondary | ICD-10-CM | POA: Diagnosis not present

## 2019-08-14 DIAGNOSIS — K219 Gastro-esophageal reflux disease without esophagitis: Secondary | ICD-10-CM | POA: Diagnosis not present

## 2019-08-14 DIAGNOSIS — Z91030 Bee allergy status: Secondary | ICD-10-CM | POA: Diagnosis not present

## 2019-08-14 DIAGNOSIS — Z79899 Other long term (current) drug therapy: Secondary | ICD-10-CM | POA: Diagnosis not present

## 2019-08-14 DIAGNOSIS — R079 Chest pain, unspecified: Secondary | ICD-10-CM

## 2019-08-14 DIAGNOSIS — J45909 Unspecified asthma, uncomplicated: Secondary | ICD-10-CM | POA: Diagnosis not present

## 2019-08-14 DIAGNOSIS — R110 Nausea: Secondary | ICD-10-CM | POA: Insufficient documentation

## 2019-08-14 DIAGNOSIS — R531 Weakness: Secondary | ICD-10-CM | POA: Insufficient documentation

## 2019-08-14 DIAGNOSIS — J449 Chronic obstructive pulmonary disease, unspecified: Secondary | ICD-10-CM | POA: Insufficient documentation

## 2019-08-14 DIAGNOSIS — L299 Pruritus, unspecified: Secondary | ICD-10-CM | POA: Diagnosis not present

## 2019-08-14 DIAGNOSIS — Z87891 Personal history of nicotine dependence: Secondary | ICD-10-CM | POA: Diagnosis not present

## 2019-08-14 DIAGNOSIS — M791 Myalgia, unspecified site: Secondary | ICD-10-CM | POA: Diagnosis not present

## 2019-08-14 DIAGNOSIS — I10 Essential (primary) hypertension: Secondary | ICD-10-CM | POA: Insufficient documentation

## 2019-08-14 DIAGNOSIS — R42 Dizziness and giddiness: Secondary | ICD-10-CM | POA: Insufficient documentation

## 2019-08-14 DIAGNOSIS — N390 Urinary tract infection, site not specified: Secondary | ICD-10-CM | POA: Insufficient documentation

## 2019-08-14 DIAGNOSIS — Z7901 Long term (current) use of anticoagulants: Secondary | ICD-10-CM | POA: Insufficient documentation

## 2019-08-14 DIAGNOSIS — R0902 Hypoxemia: Secondary | ICD-10-CM | POA: Diagnosis not present

## 2019-08-14 DIAGNOSIS — E7800 Pure hypercholesterolemia, unspecified: Secondary | ICD-10-CM | POA: Diagnosis not present

## 2019-08-14 LAB — BASIC METABOLIC PANEL
Anion gap: 12 (ref 5–15)
BUN: 11 mg/dL (ref 8–23)
CO2: 24 mmol/L (ref 22–32)
Calcium: 9.9 mg/dL (ref 8.9–10.3)
Chloride: 104 mmol/L (ref 98–111)
Creatinine, Ser: 1.04 mg/dL — ABNORMAL HIGH (ref 0.44–1.00)
GFR calc Af Amer: 58 mL/min — ABNORMAL LOW (ref >60)
GFR calc non Af Amer: 50 mL/min — ABNORMAL LOW (ref >60)
Glucose, Bld: 75 mg/dL (ref 70–99)
Potassium: 3.7 mmol/L (ref 3.5–5.1)
Sodium: 140 mmol/L (ref 135–145)

## 2019-08-14 LAB — CBC
HCT: 39 % (ref 36.0–46.0)
Hemoglobin: 12.9 g/dL (ref 12.0–15.0)
MCH: 31.2 pg (ref 26.0–34.0)
MCHC: 33.1 g/dL (ref 30.0–36.0)
MCV: 94.4 fL (ref 80.0–100.0)
Platelets: 249 10*3/uL (ref 150–400)
RBC: 4.13 MIL/uL (ref 3.87–5.11)
RDW: 13.1 % (ref 11.5–15.5)
WBC: 3.9 10*3/uL — ABNORMAL LOW (ref 4.0–10.5)
nRBC: 0 % (ref 0.0–0.2)

## 2019-08-14 LAB — TROPONIN I (HIGH SENSITIVITY)
Troponin I (High Sensitivity): <2 ng/L (ref <18)
Troponin I (High Sensitivity): 2 ng/L (ref <18)

## 2019-08-14 MED ORDER — SODIUM CHLORIDE 0.9% FLUSH: 3.0000 mL | Freq: Once | INTRAVENOUS | Status: DC

## 2019-08-14 NOTE — ED Triage Notes (Signed)
BIB EMS from PCP - office was closing so called 911 for pt to be transported here. Reports CP/dizziness X1 day. Pt in NAD.

## 2019-08-15 ENCOUNTER — Other Ambulatory Visit: Payer: Self-pay

## 2019-08-15 DIAGNOSIS — J45909 Unspecified asthma, uncomplicated: Secondary | ICD-10-CM | POA: Diagnosis not present

## 2019-08-15 DIAGNOSIS — I10 Essential (primary) hypertension: Secondary | ICD-10-CM | POA: Diagnosis not present

## 2019-08-15 DIAGNOSIS — E7800 Pure hypercholesterolemia, unspecified: Secondary | ICD-10-CM | POA: Diagnosis not present

## 2019-08-15 LAB — URINALYSIS, ROUTINE W REFLEX MICROSCOPIC
Bacteria, UA: NONE SEEN
Bilirubin Urine: NEGATIVE
Glucose, UA: NEGATIVE mg/dL
Hgb urine dipstick: NEGATIVE
Ketones, ur: NEGATIVE mg/dL
Nitrite: NEGATIVE
Protein, ur: NEGATIVE mg/dL
Specific Gravity, Urine: 1.009 (ref 1.005–1.030)
pH: 6 (ref 5.0–8.0)

## 2019-08-15 LAB — URINE CULTURE: Culture: <10000 — AB

## 2019-08-15 LAB — CK: Total CK: 486 U/L — ABNORMAL HIGH (ref 38–234)

## 2019-08-15 MED ORDER — NITROFURANTOIN MONOHYD MACRO 100 MG PO CAPS: 100.0000 mg | ORAL_CAPSULE | Freq: Two times a day (BID) | ORAL | 0 refills | Status: DC

## 2019-08-15 NOTE — ED Provider Notes (Signed)
[facility] EMERGENCY DEPARTMENT Provider Note   CSN: [id number] Arrival date & time: 08/14/19  1901     History Chief Complaint  Patient presents with  . Chest Pain    [place] is a 84 y.o. female.  Patient sent to the emergency department from her primary care doctor's office.  Patient presented to PCP today because she was experiencing generalized weakness with dizziness today.  She then felt some tightness in her chest and mild nausea.  PCPs office called EMS and sent the patient to the hospital.  Patient reports that much of the symptoms have not resolved, she has now simply short of breath.  She reports a history of asthma and this feels similar.  No further chest discomfort.        Past Medical History:  Diagnosis Date  . Arthritis   . Asthma   . Blood clotting disorder ([place])   . Cataract   . COPD (chronic obstructive pulmonary disease) ([place])   . Depression   . Diverticulosis 10-2011   Colonoscopy  . GERD (gastroesophageal reflux disease)   . Hemorrhoids, internal   . Hx of colonic polyp   . Hyperlipemia   . Hypertension   . IBS (irritable bowel syndrome)   . 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   EGD  . TIA (transient ischemic attack)     Patient Active Problem List   Diagnosis Date Noted  . Myalgia 07/21/2019  . Thrush 07/21/2019  . Dyslipidemia 05/24/2018  . Chest pain 05/24/2018  . Abnormal EKG 05/24/2018  . Pleurisy 09/13/2017  . Neck pain 10/21/2014  . LBP (low back pain) 10/21/2014  . Essential hypertension 07/15/2014  . HLD (hyperlipidemia) 03/24/2014  . Headache on top of head 03/24/2014  . TIA (transient ischemic attack) 03/13/2014  . Stricture and stenosis of esophagus 12/13/2011  . Family history of malignant neoplasm of gastrointestinal tract 11/01/2011  . Personal history of colonic polyps 11/01/2011  . Allergic rhinitis 09/17/2008  . Intrinsic asthma 09/17/2008  . GERD 09/17/2008  . CHOLECYSTECTOMY, HX OF  09/17/2008    Past Surgical History:  Procedure Laterality Date  . [place]  . LAPAROSCOPIC CHOLECYSTECTOMY  1995     OB History   No obstetric history on file.     Family History  Problem Relation Age of Onset  . Heart disease Mother   . Hypertension Mother   . Diabetes Mother   . Asthma Father   . Colon cancer Sister 73  . Irritable bowel syndrome Sister   . Breast cancer Other        Niece  . Stomach cancer Neg Hx     Social History   Tobacco Use  . Smoking status: Former Smoker    Packs/day: 0.50    Types: Cigarettes    Quit date: 09/28/1959    Years since quitting: 59.9  . Smokeless tobacco: Never Used  Substance Use Topics  . Alcohol use: No    Alcohol/week: 0.0 standard drinks  . Drug use: No    Home Medications Prior to Admission medications   Medication Sig Start Date End Date Taking? Authorizing Provider  albuterol (PROVENTIL HFA;VENTOLIN HFA) 108 (90 Base) MCG/ACT inhaler Inhale 2 puffs into the lungs every 6 (six) hours as needed for wheezing or shortness of breath.   Yes [provider]  albuterol (PROVENTIL) (2.5 MG/3ML) 0.083% nebulizer solution Take 3 mLs (2.5 mg total) by nebulization every 4 (four) hours as needed for wheezing  or shortness of breath. 07/18/17  Yes Collene Gobble, MD  Alpha-D-Galactosidase (BEANO PO) Take 1 capsule by mouth as needed (for gas and bloating).    Yes [provider]  clopidogrel (PLAVIX) 75 MG tablet Take 75 mg by mouth daily. 09/24/14  Yes [provider]  cycloSPORINE (RESTASIS) 0.05 % ophthalmic emulsion Place 1 drop into both eyes 2 (two) times daily.    Yes [provider]  Echinacea 125 MG CAPS Take 125 mg by mouth daily.   Yes [provider]  EPINEPHrine 0.3 mg/0.3 mL IJ SOAJ injection Inject 0.3 mg into the muscle once as needed (for a severe allergic reaction/use as directed).  02/01/18  Yes [provider]  hydrochlorothiazide (MICROZIDE) 12.5  MG capsule Take 12.5 mg by mouth daily.  07/18/18  Yes [provider]  losartan (COZAAR) 50 MG tablet Take 50 mg by mouth daily. 07/18/18  Yes [provider]  mometasone-formoterol (DULERA) 200-5 MCG/ACT AERO Inhale 2 puffs into the lungs 2 (two) times daily. 07/21/19  Yes Collene Gobble, MD  Multiple Vitamin (MULTIVITAMIN) tablet Take 1 tablet by mouth daily.    Yes [provider]  omeprazole (PRILOSEC) 40 MG capsule Take 1 capsule (40 mg total) by mouth daily. Patient taking differently: Take 40 mg by mouth daily as needed.  04/11/18  Yes Mauri Pole, MD  Probiotic Product (PROBIOTIC PO) Take 1 capsule by mouth daily.    Yes [provider]  Propylene Glycol (SYSTANE BALANCE) 0.6 % SOLN Place 1 drop into both eyes 4 (four) times daily as needed (dry eyes).   Yes [provider]  zinc gluconate 50 MG tablet Take 50 mg by mouth 2 (two) times a week.   Yes [provider]  fluconazole (DIFLUCAN) 100 MG tablet Take 1 tablet (100 mg total) by mouth daily. Patient not taking: Reported on 08/15/2019 07/21/19   Collene Gobble, MD  nitrofurantoin, macrocrystal-monohydrate, (MACROBID) 100 MG capsule Take 1 capsule (100 mg total) by mouth 2 (two) times daily. X 7 days 08/15/19   Orpah Greek, MD  Respiratory Therapy Supplies (FLUTTER) DEVI Use device 2-3 times a day to break up congestion 07/17/18   Martyn Ehrich, NP    Allergies    Cepacol, Contrast media [iodinated diagnostic agents], Iodine, Lactose intolerance (gi), Mushroom extract complex, Other, Penicillins, Sulfamethoxazole, and Symbicort [budesonide-formoterol fumarate]  Review of Systems   Review of Systems  Constitutional: Positive for fatigue.  Respiratory: Positive for shortness of breath.   Cardiovascular: Positive for chest pain.  Neurological: Positive for dizziness.  All other systems reviewed and are negative.   Physical Exam Updated Vital Signs BP 122/66    Pulse 67   Temp 97.8 F (36.6 C) (Oral)   Resp 17   Ht 5' (1.524 m)   Wt 61.2 kg   SpO2 98%   BMI 26.37 kg/m   Physical Exam Vitals and nursing note reviewed.  Constitutional:      General: She is not in acute distress.    Appearance: Normal appearance. She is well-developed.  HENT:     Head: Normocephalic and atraumatic.     Right Ear: Hearing normal.     Left Ear: Hearing normal.     Nose: Nose normal.  Eyes:     Conjunctiva/sclera: Conjunctivae normal.     Pupils: Pupils are equal, round, and reactive to light.  Cardiovascular:     Rate and Rhythm: Regular rhythm.  Heart sounds: S1 normal and S2 normal. No murmur. No friction rub. No gallop.   Pulmonary:     Effort: Pulmonary effort is normal. No respiratory distress.     Breath sounds: Normal breath sounds.  Chest:     Chest wall: No tenderness.  Abdominal:     General: Bowel sounds are normal.     Palpations: Abdomen is soft.     Tenderness: There is no abdominal tenderness. There is no guarding or rebound. Negative signs include Murphy's sign and McBurney's sign.     Hernia: No hernia is present.  Musculoskeletal:        General: Normal range of motion.     Cervical back: Normal range of motion and neck supple.  Skin:    General: Skin is warm and dry.     Findings: No rash.  Neurological:     Mental Status: She is alert and oriented to person, place, and time.     GCS: GCS eye subscore is 4. GCS verbal subscore is 5. GCS motor subscore is 6.     Cranial Nerves: No cranial nerve deficit.     Sensory: No sensory deficit.     Coordination: Coordination normal.  Psychiatric:        Speech: Speech normal.        Behavior: Behavior normal.        Thought Content: Thought content normal.     ED Results / Procedures / Treatments   Labs (all labs ordered are listed, but only abnormal results are displayed) Labs Reviewed  BASIC METABOLIC PANEL - Abnormal; Notable for the following components:      Result  Value   Creatinine, Ser 1.04 (*)    GFR calc non Af Amer 50 (*)    GFR calc Af Amer 58 (*)    All other components within normal limits  CBC - Abnormal; Notable for the following components:   WBC 3.9 (*)    All other components within normal limits  URINALYSIS, ROUTINE W REFLEX MICROSCOPIC - Abnormal; Notable for the following components:   Leukocytes,Ua MODERATE (*)    All other components within normal limits  CK - Abnormal; Notable for the following components:   Total CK 486 (*)    All other components within normal limits  URINE CULTURE  TROPONIN I (HIGH SENSITIVITY)  TROPONIN I (HIGH SENSITIVITY)    EKG EKG Interpretation  Date/Time:  Thursday August 14 2019 19:03:46 EST Ventricular Rate:  82 PR Interval:  158 QRS Duration: 72 QT Interval:  376 QTC Calculation: 439 R Axis:   70 Text Interpretation: Normal sinus rhythm Low voltage QRS Cannot rule out Anterior infarct , age undetermined Abnormal ECG No significant change since last tracing Confirmed by Orpah Greek [phone]) on 08/15/2019 12:03:53 AM   Radiology DG Chest 2 View  Result Date: 08/14/2019 CLINICAL DATA:  Chest pain EXAM: CHEST - 2 VIEW COMPARISON:  07/17/2018 FINDINGS: Hyperinflation. No focal opacity or pleural effusion. Normal cardiomediastinal silhouette. No pneumothorax. IMPRESSION: No active cardiopulmonary disease.  Hyperinflated lungs. Electronically Signed   By: Donavan Foil M.D.   On: 08/14/2019 19:29    Procedures Procedures (including critical care time)  Medications Ordered in ED Medications  sodium chloride flush (NS) 0.9 % injection 3 mL (0 mLs Intravenous Hold 08/14/19 2346)    ED Course  I have reviewed the triage vital signs and the nursing notes.  Pertinent labs & imaging results that were available during my care of  the patient were reviewed by me and considered in my medical decision making (see chart for details).    MDM Rules/Calculators/A&P                       Patient presented to the emergency department after being sent here from primary care doctor's office.  Patient had been experiencing chest tightness and dizziness earlier today.  She does have a history of asthma but does not have any cardiac history.  Chest pain symptoms resolved prior to my evaluation of her.  She has not had any recurrence.  Vital signs are all normal.  EKG unremarkable.  Troponin negative x2.  Patient complaining of diffuse muscle pain.  She does not take a statin.  CPK added on, 486.  This is a very slight elevation but does not require any aggressive management at this time.  Will encourage oral hydration and have follow-up with PCP.  She is not experiencing any urinary symptoms but with her generalized weakness and episode of dizziness earlier, urinalysis was checked.  She does have white blood cells and moderate esterase.  Will order urine culture and empirically treat. Final Clinical Impression(s) / ED Diagnoses Final diagnoses:  Chest pain, unspecified type  Weakness  Urinary tract infection without hematuria, site unspecified    Rx / DC Orders ED Discharge Orders         Ordered    nitrofurantoin, macrocrystal-monohydrate, (MACROBID) 100 MG capsule  2 times daily     08/15/19 0250           Orpah Greek, MD 08/15/19 [phone]

## 2019-08-15 NOTE — ED Notes (Signed)
Patient verbalizes understanding of discharge instructions. Opportunity for questioning and answers were provided. Armband removed by staff, pt discharged from ED ambulatory.   

## 2019-08-18 DIAGNOSIS — J45909 Unspecified asthma, uncomplicated: Secondary | ICD-10-CM | POA: Diagnosis not present

## 2019-08-18 DIAGNOSIS — I10 Essential (primary) hypertension: Secondary | ICD-10-CM | POA: Diagnosis not present

## 2019-08-18 DIAGNOSIS — E7800 Pure hypercholesterolemia, unspecified: Secondary | ICD-10-CM | POA: Diagnosis not present

## 2019-08-20 ENCOUNTER — Telehealth (INDEPENDENT_AMBULATORY_CARE_PROVIDER_SITE_OTHER): Payer: PPO | Admitting: Acute Care

## 2019-08-20 ENCOUNTER — Encounter: Payer: Self-pay | Admitting: Acute Care

## 2019-08-20 ENCOUNTER — Telehealth: Payer: Self-pay | Admitting: Emergency Medicine

## 2019-08-20 DIAGNOSIS — B370 Candidal stomatitis: Secondary | ICD-10-CM | POA: Diagnosis not present

## 2019-08-20 DIAGNOSIS — E663 Overweight: Secondary | ICD-10-CM | POA: Diagnosis not present

## 2019-08-20 DIAGNOSIS — M25511 Pain in right shoulder: Secondary | ICD-10-CM | POA: Diagnosis not present

## 2019-08-20 DIAGNOSIS — M2550 Pain in unspecified joint: Secondary | ICD-10-CM | POA: Diagnosis not present

## 2019-08-20 DIAGNOSIS — Z6825 Body mass index (BMI) 25.0-25.9, adult: Secondary | ICD-10-CM | POA: Diagnosis not present

## 2019-08-20 DIAGNOSIS — R748 Abnormal levels of other serum enzymes: Secondary | ICD-10-CM | POA: Diagnosis not present

## 2019-08-20 DIAGNOSIS — M25512 Pain in left shoulder: Secondary | ICD-10-CM | POA: Diagnosis not present

## 2019-08-20 MED ORDER — AEROCHAMBER MV MISC: 0 refills | Status: DC

## 2019-08-20 MED ORDER — NYSTATIN 100000 UNIT/ML MT SUSP: OROMUCOSAL | 0 refills | Status: DC

## 2019-08-20 NOTE — Progress Notes (Signed)
Virtual Visit via Video Note  I connected with [name] on 08/20/19 at  4:00 PM EST by a video enabled telemedicine application and verified that I am speaking with the correct person using two identifiers.  Location: Patient: Home Provider: [place], [place], [place], [address]    I discussed the limitations of evaluation and management by telemedicine and the availability of in person appointments. The patient expressed understanding and agreed to proceed.  Synopsis: 84 year old female former smoker with intrinsic asthma. She is followed by Dr. Lamonte Sakai.  History of Present Illness: Patient presents for acute video visit.  Patient called the office today with concern that she had developed thrush from her inhaler.  Patient was seen by Dr. Lamonte Sakai on February 1 and he prescribed Diflucan 100 mg x 1.  The patient stated that medication did clear up her thrush.  Subsequently the patient presented to the emergency room Thursday 2/25 as she was having chest pain.  Part of the work-up in the ED was urinalysis which showed that the patient had a positive UTI.  UA was positive for moderate leukocytes.  Urine culture revealed less than 10,000 colonies of insignificant growth.  She was placed on  Macrobid 100 mg twice daily starting 08/15/2019.  The patient states that she developed thrush again after starting the antibiotic, and she was concerned that perhaps her Dulera inhaler was contributing.  She complains of a red sore mouth and tongue.  I did visualize this  while on the video conference call with her today.  She is in no distress, and there is no swelling to her tongue or airway.  She is able to eat and drink normally.  She denies any fever, chest pain, orthopnea, or hemoptysis. Observations/Objective: 08/14/2019 CXR FINDINGS: Hyperinflation. No focal opacity or pleural effusion. Normal cardiomediastinal silhouette. No pneumothorax. No active cardiopulmonary disease.   Hyperinflated lungs.  Assessment and Plan: Recurring oral thrush secondary to oral corticosteroid inhalers/antibiotic therapy for UTI Previously treated with one-time dose of Diflucan 100 mg Plan We will send in a prescription for Nystatin mouthwash swish for several minutes, then swallow 5-10 cc's every 6 hours x 10 days for thrush We will prescribe a spacer for you to use with your [facility] Inhaler. Please come to the office and pick it up , and the nurse will teach you how to use it. This should help if the [facility] is contributing to the thrush you are experiencing. Please get Culturelle Probiotic tablets for digestive health, and take once daily for 30 days. You can get these at the drug store, over the counter.  Please eat Activia yogurt once daily while on antibiotics.  Follow up with Dr. Lamonte Sakai in 2 months Follow up with Dr. Drema Dallas as scheduled. Please contact office for sooner follow up if symptoms do not improve or worsen or seek emergency care    Follow Up Instructions: Follow up with Dr. Lamonte Sakai in 2 months Follow up with Dr. Drema Dallas as scheduled. Please call the office if symptoms get worse rather than better   I discussed the assessment and treatment plan with the patient. The patient was provided an opportunity to ask questions and all were answered. The patient agreed with the plan and demonstrated an understanding of the instructions.   The patient was advised to call back or seek an in-person evaluation if the symptoms worsen or if the condition fails to improve as anticipated.  I provided 33 minutes of non-face-to-face time during  this encounter.   Magdalen Spatz, NP 08/20/2019 5:26 PM

## 2019-08-20 NOTE — Patient Instructions (Addendum)
It is good to see you today We will send in a prescription for Nystatin mouthwash swish for several minutes, then swallow 5-10 cc's every 6 hours x 10 days for thrush We will prescribe a spacer for you to use with your [facility] Inhaler. Please come to the office and pick it up , and the nurse will teach you how to use it. This should help if the [facility] is contributing to the thrush you are experiencing. Please get Culturelle Probiotic tablets for digestive health, and take once daily for 30 days. You can get these at the drug store, over the counter.  Please eat Activia yogurt once daily while on antibiotics.  Follow up with Dr. Lamonte Sakai in 2 months Follow up with Dr. Drema Dallas as scheduled. Please contact office for sooner follow up if symptoms do not improve or worsen or seek emergency care

## 2019-08-20 NOTE — Telephone Encounter (Signed)
Called and spoke with pt letting her know that SG said for [place] to schedule video visit and she verbalized understanding. Pt has been scheduled mychart visit with SG today at 4pm. Nothing further needed.

## 2019-08-20 NOTE — Telephone Encounter (Signed)
Called and spoke with pt who stated she has thrush from her inhaler. Pt stated she was given a medication after OV with RB 2/1 which cleared up the thrush. Pt stated she was then in the ER Thurs. 2/25 due to having chest pains and also had a urinalysis performed which showed she had a UTI. Pt stated the thrush had come back so was prescribed med at the hospital which did help it go away again.  Pt stated her thrush came back again two days ago with red patches on her tongue that is sore. Pt believes this is coming from her [facility]. Pt states that she does rinse her mouth out well after using it but still is developing this. Pt is wanting something prescribed to help with the thrush. Sarah, please advise on this for pt. Thanks!

## 2019-08-20 NOTE — Telephone Encounter (Signed)
Please add to my schedule today as a video/ or tele visit.If she would prefer in person visit that is also ok.  I would like to be able to look at her tongue. Thanks

## 2019-08-28 DIAGNOSIS — R300 Dysuria: Secondary | ICD-10-CM | POA: Diagnosis not present

## 2019-09-08 ENCOUNTER — Telehealth: Payer: Self-pay | Admitting: Emergency Medicine

## 2019-09-08 NOTE — Telephone Encounter (Signed)
Spoke with pt, aware of recs.  Scheduled rov with BW.  Nothing further needed at this time- will close encounter.

## 2019-09-08 NOTE — Telephone Encounter (Signed)
Please have her stop the [facility]. I would like for her to stay off a maintenance inhaler for about a week to insure that her side effects resolve. At that time we can consider starting her on an alternative (possibly ICS alone although she has had thrush before). Have her set up an OV with me or APP 1-2 weeks to discuss.

## 2019-09-08 NOTE — Telephone Encounter (Signed)
Called and spoke with Patient.  Patient stated she has been using Dulera, and has been having mouth sores, redness, and burning.  Patient stated she has been rinsing her mouth after each use. Patient is requesting another type of inhaler, and samples to try, if we have any samples available.  Message routed to Dr. Lamonte Sakai

## 2019-09-16 ENCOUNTER — Encounter: Payer: Self-pay | Admitting: Primary Care

## 2019-09-16 ENCOUNTER — Other Ambulatory Visit: Payer: Self-pay

## 2019-09-16 ENCOUNTER — Ambulatory Visit: Payer: PPO | Admitting: Primary Care

## 2019-09-16 DIAGNOSIS — J45909 Unspecified asthma, uncomplicated: Secondary | ICD-10-CM | POA: Diagnosis not present

## 2019-09-16 DIAGNOSIS — B370 Candidal stomatitis: Secondary | ICD-10-CM | POA: Diagnosis not present

## 2019-09-16 MED ORDER — NYSTATIN 100000 UNIT/ML MT SUSP: OROMUCOSAL | 0 refills | Status: DC

## 2019-09-16 MED ORDER — STIOLTO RESPIMAT 2.5-2.5 MCG/ACT IN AERS: 2.0000 | INHALATION_SPRAY | Freq: Every day | RESPIRATORY_TRACT | 0 refills | Status: DC

## 2019-09-16 NOTE — Assessment & Plan Note (Addendum)
-   Responses well to oral antifungal suspension  - Refilling nystatin 41ml qid x 1 week

## 2019-09-16 NOTE — Patient Instructions (Addendum)
Recommendations: STOP Dulera START Stiolto- take two puffs in the morning Use albuterol nebulizer as needed every 4-6 hours for shortness of breath  RX: Nystatin swish and swallow  Follow-up: 2 weeks with [employer]

## 2019-09-16 NOTE — Assessment & Plan Note (Signed)
-   Patient has had recurrent thrush with ICS/LABA inhaler. She has obstruction on pulmonary function testing. Will try sample LABA/LAMA (Stiolto 2 puffs once daily) - Follow-up in 2 weeks

## 2019-09-16 NOTE — Progress Notes (Signed)
@Patient  ID: [name], female    DOB: Apr 12, 1936, 84 y.o.   MRN: [id number]  Chief Complaint  Patient presents with  . Follow-up    f /u Moderate asthma.     Referring provider: Leighton Ruff, MD  HPI: 84 year old female, former remote smoker (0.5 pack year hx).  Past medical history significant for asthma, pleurisy, hypertension, TIA, GERD, myalgia.  Patient of Dr. Lamonte Sakai, last seen by pulmonary nurse practitioner on 08/20/2019 for video visit for thrush symptoms and given prescription for nystatin swish and swallow.  09/16/2019 Patient presents today for acute visit with complaints of recurrent thrush symptoms. She stopped using Dulera on March 22nd per Dr. Agustina Caroli recommendations. She has been using albuterol rescue inhaler more frequently since then. States that the nystatin s/s originally cleared her thrush symptoms earlier this month.   Pulmonary function testing: 10/18/17 PFT- FVC 2.08 (146%), FEV1 1.13 (104%), ratio 54 +BD  Allergies  Allergen Reactions  . Cepacol Shortness Of Breath    Difficulty breathing  . Contrast Media [Iodinated Diagnostic Agents] Shortness Of Breath  . Iodine Shortness Of Breath  . Lactose Intolerance (Gi) Shortness Of Breath    All dairy  Causes shortness of breath  . Mushroom Extract Complex Shortness Of Breath  . Other Hives, Shortness Of Breath and Swelling    Mushrooms (All mushrooms)  . Penicillins Shortness Of Breath and Swelling    DID THE REACTION INVOLVE: Swelling of the face/tongue/throat, SOB, or low BP? Yes  Sudden or severe rash/hives, skin peeling, or the inside of the mouth or nose? No Did it require medical treatment? No When did it last happen?Over 10 years ago If all above answers are "NO", may proceed with cephalosporin use.   . Sulfamethoxazole Anaphylaxis  . Symbicort [Budesonide-Formoterol Fumarate] Other (See Comments)    Dry eyes resulted    Immunization History  Administered Date(s) Administered  .  Influenza Inj Mdck Quad Pf 06/16/2018    Past Medical History:  Diagnosis Date  . Arthritis   . Asthma   . Blood clotting disorder ([place])   . Cataract   . COPD (chronic obstructive pulmonary disease) ([place])   . Depression   . Diverticulosis 10-2011   Colonoscopy  . GERD (gastroesophageal reflux disease)   . Hemorrhoids, internal   . Hx of colonic polyp   . Hyperlipemia   . Hypertension   . IBS (irritable bowel syndrome)   . 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   EGD  . TIA (transient ischemic attack)     Tobacco History: Social History   Tobacco Use  Smoking Status Former Smoker  . Packs/day: 0.50  . Types: Cigarettes  . Quit date: 09/28/1959  . Years since quitting: 60.0  Smokeless Tobacco Never Used   Counseling given: Not Answered   Outpatient Medications Prior to Visit  Medication Sig Dispense Refill  . albuterol (PROVENTIL HFA;VENTOLIN HFA) 108 (90 Base) MCG/ACT inhaler Inhale 2 puffs into the lungs every 6 (six) hours as needed for wheezing or shortness of breath.    Marland Kitchen albuterol (PROVENTIL) (2.5 MG/3ML) 0.083% nebulizer solution Take 3 mLs (2.5 mg total) by nebulization every 4 (four) hours as needed for wheezing or shortness of breath. 150 mL 5  . cycloSPORINE (RESTASIS) 0.05 % ophthalmic emulsion Place 1 drop into both eyes 2 (two) times daily.     . Echinacea 125 MG CAPS Take 125 mg by mouth daily.    Marland Kitchen EPINEPHrine 0.3 mg/0.3 mL IJ SOAJ injection Inject 0.3 mg  into the muscle once as needed (for a severe allergic reaction/use as directed).     . hydrochlorothiazide (MICROZIDE) 12.5 MG capsule Take 12.5 mg by mouth daily.     Marland Kitchen losartan (COZAAR) 50 MG tablet Take 50 mg by mouth daily.    . Multiple Vitamin (MULTIVITAMIN) tablet Take 1 tablet by mouth daily.     Marland Kitchen omeprazole (PRILOSEC) 40 MG capsule Take 1 capsule (40 mg total) by mouth daily. (Patient taking differently: Take 40 mg by mouth daily as needed. ) 90 capsule 3  . Probiotic Product (PROBIOTIC PO) Take 1  capsule by mouth daily.     Marland Kitchen Propylene Glycol (SYSTANE BALANCE) 0.6 % SOLN Place 1 drop into both eyes 4 (four) times daily as needed (dry eyes).    Marland Kitchen Spacer/Aero-Holding Chambers (AEROCHAMBER MV) inhaler Use as instructed 1 each 0  . zinc gluconate 50 MG tablet Take 50 mg by mouth 2 (two) times a week.    . Alpha-D-Galactosidase (BEANO PO) Take 1 capsule by mouth as needed (for gas and bloating).     . clopidogrel (PLAVIX) 75 MG tablet Take 75 mg by mouth daily.  1  . Respiratory Therapy Supplies (FLUTTER) DEVI Use device 2-3 times a day to break up congestion 1 each 0  . fluconazole (DIFLUCAN) 100 MG tablet Take 1 tablet (100 mg total) by mouth daily. 5 tablet 0  . mometasone-formoterol (DULERA) 200-5 MCG/ACT AERO Inhale 2 puffs into the lungs 2 (two) times daily. 8.8 g 5  . nitrofurantoin, macrocrystal-monohydrate, (MACROBID) 100 MG capsule Take 1 capsule (100 mg total) by mouth 2 (two) times daily. X 7 days 14 capsule 0  . nystatin (MYCOSTATIN) 100000 UNIT/ML suspension Swish and swallow 30mL three to four times daily x5-7days 140 mL 0   No facility-administered medications prior to visit.   Review of Systems  Review of Systems  HENT:       Thursh/sore to tonge  Respiratory: Positive for shortness of breath.    Physical Exam  BP 116/64 (BP Location: Left Arm, Patient Position: Sitting, Cuff Size: Normal)   Pulse 73   Temp (!) 97.2 F (36.2 C) (Temporal)   Ht 5' (1.524 m)   Wt 134 lb 3.2 oz (60.9 kg)   SpO2 98% Comment: room air  BMI 26.21 kg/m  Physical Exam Constitutional:      Appearance: Normal appearance. She is not ill-appearing.  HENT:     Head: Normocephalic and atraumatic.     Mouth/Throat:     Comments: Linear fissures and milky white film to tongue Cardiovascular:     Rate and Rhythm: Normal rate and regular rhythm.  Pulmonary:     Effort: Pulmonary effort is normal.     Breath sounds: No wheezing.     Comments: CTA Neurological:     General: No focal  deficit present.     Mental Status: She is alert and oriented to person, place, and time. Mental status is at baseline.  Psychiatric:        Mood and Affect: Mood normal.        Behavior: Behavior normal.        Thought Content: Thought content normal.        Judgment: Judgment normal.      Lab Results:  CBC    Component Value Date/Time   WBC 3.9 (L) 08/14/2019 1904   RBC 4.13 08/14/2019 1904   HGB 12.9 08/14/2019 1904   HCT 39.0 08/14/2019 1904   PLT  249 08/14/2019 1904   MCV 94.4 08/14/2019 1904   MCH 31.2 08/14/2019 1904   MCHC 33.1 08/14/2019 1904   RDW 13.1 08/14/2019 1904   LYMPHSABS 1.3 06/26/2018 2300   MONOABS 0.5 06/26/2018 2300   EOSABS 0.1 06/26/2018 2300   BASOSABS 0.0 06/26/2018 2300    BMET    Component Value Date/Time   NA 140 08/14/2019 1904   K 3.7 08/14/2019 1904   CL 104 08/14/2019 1904   CO2 24 08/14/2019 1904   GLUCOSE 75 08/14/2019 1904   BUN 11 08/14/2019 1904   CREATININE 1.04 (H) 08/14/2019 1904   CALCIUM 9.9 08/14/2019 1904   GFRNONAA 50 (L) 08/14/2019 1904   GFRAA 58 (L) 08/14/2019 1904    BNP No results found for: BNP  ProBNP No results found for: PROBNP  Imaging: No results found.   Assessment & Plan:   Intrinsic asthma - Patient has had recurrent thrush with ICS/LABA inhaler. She has obstruction on pulmonary function testing. Will try sample LABA/LAMA (Stiolto 2 puffs once daily) - Follow-up in 2 weeks   Thrush - Responses well to oral antifungal suspension  - Refilling nystatin 79ml qid x 1 week    Martyn Ehrich, NP 09/16/2019

## 2019-09-22 ENCOUNTER — Telehealth: Payer: Self-pay | Admitting: Emergency Medicine

## 2019-09-22 NOTE — Telephone Encounter (Signed)
Spoke with pt, she states she thought she ran out of it but it was just a malfunction. I advised her on how to use Stiolto. Pt understood and nothing further is needed.

## 2019-09-22 NOTE — Telephone Encounter (Signed)
Pt called back and states she does not need refill any longer. Pt did not know how to work it but said it's new.

## 2019-09-23 ENCOUNTER — Encounter (HOSPITAL_COMMUNITY): Payer: Self-pay

## 2019-09-23 ENCOUNTER — Other Ambulatory Visit: Payer: Self-pay

## 2019-09-23 ENCOUNTER — Emergency Department (HOSPITAL_COMMUNITY)
Admission: EM | Admit: 2019-09-23 | Discharge: 2019-09-23 | Disposition: A | Discharge status: Home / Self Care | Payer: PPO | Attending: Emergency Medicine | Admitting: Emergency Medicine

## 2019-09-23 ENCOUNTER — Emergency Department (HOSPITAL_COMMUNITY): Payer: PPO

## 2019-09-23 DIAGNOSIS — I10 Essential (primary) hypertension: Secondary | ICD-10-CM | POA: Insufficient documentation

## 2019-09-23 DIAGNOSIS — Z87891 Personal history of nicotine dependence: Secondary | ICD-10-CM | POA: Diagnosis not present

## 2019-09-23 DIAGNOSIS — R0602 Shortness of breath: Secondary | ICD-10-CM | POA: Diagnosis not present

## 2019-09-23 DIAGNOSIS — J441 Chronic obstructive pulmonary disease with (acute) exacerbation: Secondary | ICD-10-CM | POA: Diagnosis not present

## 2019-09-23 DIAGNOSIS — R062 Wheezing: Secondary | ICD-10-CM | POA: Diagnosis not present

## 2019-09-23 DIAGNOSIS — Z79899 Other long term (current) drug therapy: Secondary | ICD-10-CM | POA: Diagnosis not present

## 2019-09-23 DIAGNOSIS — Z209 Contact with and (suspected) exposure to unspecified communicable disease: Secondary | ICD-10-CM | POA: Diagnosis not present

## 2019-09-23 LAB — CBC WITH DIFFERENTIAL/PLATELET
Abs Immature Granulocytes: 0.02 10*3/uL (ref 0.00–0.07)
Basophils Absolute: 0 10*3/uL (ref 0.0–0.1)
Basophils Relative: 0 %
Eosinophils Absolute: 0.1 10*3/uL (ref 0.0–0.5)
Eosinophils Relative: 1 %
HCT: 36.3 % (ref 36.0–46.0)
Hemoglobin: 12 g/dL (ref 12.0–15.0)
Immature Granulocytes: 0 %
Lymphocytes Relative: 13 %
Lymphs Abs: 0.8 10*3/uL (ref 0.7–4.0)
MCH: 30.6 pg (ref 26.0–34.0)
MCHC: 33.1 g/dL (ref 30.0–36.0)
MCV: 92.6 fL (ref 80.0–100.0)
Monocytes Absolute: 0.3 10*3/uL (ref 0.1–1.0)
Monocytes Relative: 5 %
Neutro Abs: 5.1 10*3/uL (ref 1.7–7.7)
Neutrophils Relative %: 81 %
Platelets: 293 10*3/uL (ref 150–400)
RBC: 3.92 MIL/uL (ref 3.87–5.11)
RDW: 13.7 % (ref 11.5–15.5)
WBC: 6.4 10*3/uL (ref 4.0–10.5)
nRBC: 0 % (ref 0.0–0.2)

## 2019-09-23 LAB — BASIC METABOLIC PANEL
Anion gap: 11 (ref 5–15)
BUN: 20 mg/dL (ref 8–23)
CO2: 23 mmol/L (ref 22–32)
Calcium: 9.5 mg/dL (ref 8.9–10.3)
Chloride: 107 mmol/L (ref 98–111)
Creatinine, Ser: 0.98 mg/dL (ref 0.44–1.00)
GFR calc Af Amer: >60 mL/min (ref >60)
GFR calc non Af Amer: 54 mL/min — ABNORMAL LOW (ref >60)
Glucose, Bld: 117 mg/dL — ABNORMAL HIGH (ref 70–99)
Potassium: 3.7 mmol/L (ref 3.5–5.1)
Sodium: 141 mmol/L (ref 135–145)

## 2019-09-23 MED ORDER — MAGNESIUM SULFATE 2 GM/50ML IV SOLN
2.0000 g | Freq: Once | INTRAVENOUS | Status: AC
  Administered 2019-09-23: 20:00:00 2 g via INTRAVENOUS
  Filled 2019-09-23: qty 50

## 2019-09-23 MED ORDER — METHYLPREDNISOLONE SODIUM SUCC 125 MG IJ SOLR
125.0000 mg | Freq: Once | INTRAMUSCULAR | Status: AC
  Administered 2019-09-23: 20:00:00 125 mg via INTRAVENOUS
  Filled 2019-09-23: qty 2

## 2019-09-23 MED ORDER — ALBUTEROL SULFATE HFA 108 (90 BASE) MCG/ACT IN AERS
8.0000 | INHALATION_SPRAY | Freq: Once | RESPIRATORY_TRACT | Status: AC
  Administered 2019-09-23: 20:00:00 8 via RESPIRATORY_TRACT
  Filled 2019-09-23: qty 6.7

## 2019-09-23 MED ORDER — PREDNISONE 20 MG PO TABS: 60.0000 mg | ORAL_TABLET | Freq: Every day | ORAL | 0 refills | Status: DC

## 2019-09-23 NOTE — ED Triage Notes (Addendum)
Pt BIB EMS from home. Pt c/o SOB with exertion and dry cough. Pt has hx of COPD and asthma. Pt states she took 3 albuterol neb treatments today throughout the day with little relief. Pt took a PO prednisone 30 mg with some relief. EMS reports wheezing on left side. Pt 99% RA with EMS.  20G R FA

## 2019-09-23 NOTE — Discharge Instructions (Addendum)
You were seen in the emergency department for increased shortness of breath after being exposed to the pollen outside.  Your chest x-ray did not show any obvious infiltrates like a pneumonia.  Your symptoms improved with some treatment here in the emergency department.  Please continue your albuterol and finish the course of steroids that we sent to your pharmacy.  Follow-up with your doctor.  Return to the emergency department for any worsening or concerning symptoms

## 2019-09-23 NOTE — ED Notes (Signed)
An After Visit Summary was printed and given to the patient. Discharge instructions given and no further questions at this time. Pt states her brother is picking her up.

## 2019-09-23 NOTE — ED Provider Notes (Addendum)
[facility] DEPT Provider Note   CSN: [id number] Arrival date & time: 09/23/19  1918     History Chief Complaint  Patient presents with  . Shortness of Breath (COPD)    [name] is a 84 y.o. female.  She has a history of COPD and asthma.  She said she started feeling short of breath this morning after going outside and breathing and the pollen.  She has been using her nebulizer on and off today with little relief.  She took 30 mg of her prednisone leftover from prior.  Tonight she felt worse and so called EMS.  They said she was 99% on room air.  Patient denies any chest pain.  She is with a nonproductive cough.  No fevers or chills.  No vomiting or diarrhea.  She had her first Covid shot and was was to get her second Covid shot today but could not due to not having a ride.  The history is provided by the patient and the EMS personnel.  Shortness of Breath Severity:  Moderate Onset quality:  Gradual Duration:  12 hours Timing:  Constant Progression:  Unchanged Chronicity:  Recurrent Context: pollens   Relieved by:  Nothing Worsened by:  Activity Ineffective treatments:  Inhaler Associated symptoms: cough and wheezing   Associated symptoms: no abdominal pain, no chest pain, no fever, no headaches, no hemoptysis, no neck pain, no rash, no sore throat, no sputum production, no syncope and no vomiting   Risk factors: no tobacco use        Past Medical History:  Diagnosis Date  . Arthritis   . Asthma   . Blood clotting disorder ([place])   . Cataract   . COPD (chronic obstructive pulmonary disease) ([place])   . Depression   . Diverticulosis 10-2011   Colonoscopy  . GERD (gastroesophageal reflux disease)   . Hemorrhoids, internal   . Hx of colonic polyp   . Hyperlipemia   . Hypertension   . IBS (irritable bowel syndrome)   . 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   EGD  . TIA (transient ischemic attack)     Patient Active Problem List   Diagnosis Date Noted  . Myalgia 07/21/2019  . Thrush 07/21/2019  . Dyslipidemia 05/24/2018  . Chest pain 05/24/2018  . Abnormal EKG 05/24/2018  . Pleurisy 09/13/2017  . Neck pain 10/21/2014  . LBP (low back pain) 10/21/2014  . Essential hypertension 07/15/2014  . HLD (hyperlipidemia) 03/24/2014  . Headache on top of head 03/24/2014  . TIA (transient ischemic attack) 03/13/2014  . Stricture and stenosis of esophagus 12/13/2011  . Family history of malignant neoplasm of gastrointestinal tract 11/01/2011  . Personal history of colonic polyps 11/01/2011  . Allergic rhinitis 09/17/2008  . Intrinsic asthma 09/17/2008  . GERD 09/17/2008  . CHOLECYSTECTOMY, HX OF 09/17/2008    Past Surgical History:  Procedure Laterality Date  . [place]  . LAPAROSCOPIC CHOLECYSTECTOMY  1995     OB History   No obstetric history on file.     Family History  Problem Relation Age of Onset  . Heart disease Mother   . Hypertension Mother   . Diabetes Mother   . Asthma Father   . Colon cancer Sister 86  . Irritable bowel syndrome Sister   . Breast cancer Other        Niece  . Stomach cancer Neg Hx     Social History   Tobacco Use  . Smoking status:  Former Smoker    Packs/day: 0.50    Types: Cigarettes    Quit date: 09/28/1959    Years since quitting: 60.0  . Smokeless tobacco: Never Used  Substance Use Topics  . Alcohol use: No    Alcohol/week: 0.0 standard drinks  . Drug use: No    Home Medications Prior to Admission medications   Medication Sig Start Date End Date Taking? Authorizing Provider  albuterol (PROVENTIL HFA;VENTOLIN HFA) 108 (90 Base) MCG/ACT inhaler Inhale 2 puffs into the lungs every 6 (six) hours as needed for wheezing or shortness of breath.    [provider]  albuterol (PROVENTIL) (2.5 MG/3ML) 0.083% nebulizer solution Take 3 mLs (2.5 mg total) by nebulization every 4 (four) hours as needed for wheezing or shortness of breath. 07/18/17    Collene Gobble, MD  Alpha-D-Galactosidase (BEANO PO) Take 1 capsule by mouth as needed (for gas and bloating).     [provider]  clopidogrel (PLAVIX) 75 MG tablet Take 75 mg by mouth daily. 09/24/14   [provider]  cycloSPORINE (RESTASIS) 0.05 % ophthalmic emulsion Place 1 drop into both eyes 2 (two) times daily.     [provider]  Echinacea 125 MG CAPS Take 125 mg by mouth daily.    [provider]  EPINEPHrine 0.3 mg/0.3 mL IJ SOAJ injection Inject 0.3 mg into the muscle once as needed (for a severe allergic reaction/use as directed).  02/01/18   [provider]  hydrochlorothiazide (MICROZIDE) 12.5 MG capsule Take 12.5 mg by mouth daily.  07/18/18   [provider]  losartan (COZAAR) 50 MG tablet Take 50 mg by mouth daily. 07/18/18   [provider]  Multiple Vitamin (MULTIVITAMIN) tablet Take 1 tablet by mouth daily.     [provider]  nystatin (MYCOSTATIN) 100000 UNIT/ML suspension Swish and swallow 66mL three to four times daily x5-7days 09/16/19   Martyn Ehrich, NP  omeprazole (PRILOSEC) 40 MG capsule Take 1 capsule (40 mg total) by mouth daily. Patient taking differently: Take 40 mg by mouth daily as needed.  04/11/18   Mauri Pole, MD  Probiotic Product (PROBIOTIC PO) Take 1 capsule by mouth daily.     [provider]  Propylene Glycol (SYSTANE BALANCE) 0.6 % SOLN Place 1 drop into both eyes 4 (four) times daily as needed (dry eyes).    [provider]  Respiratory Therapy Supplies (FLUTTER) DEVI Use device 2-3 times a day to break up congestion 07/17/18   Martyn Ehrich, NP  Spacer/Aero-Holding Chambers (AEROCHAMBER MV) inhaler Use as instructed 08/20/19   Magdalen Spatz, NP  Tiotropium Bromide-Olodaterol (STIOLTO RESPIMAT) 2.5-2.5 MCG/ACT AERS Inhale 2 puffs into the lungs daily. 09/16/19   Martyn Ehrich, NP  zinc gluconate 50 MG tablet Take 50 mg by mouth 2 (two) times a week.     [provider]    Allergies    Cepacol, Contrast media [iodinated diagnostic agents], Iodine, Lactose intolerance (gi), Mushroom extract complex, Other, Penicillins, Sulfamethoxazole, and Symbicort [budesonide-formoterol fumarate]  Review of Systems   Review of Systems  Constitutional: Negative for fever.  HENT: Negative for sore throat.   Eyes: Negative for visual disturbance.  Respiratory: Positive for cough, shortness of breath and wheezing. Negative for hemoptysis and sputum production.   Cardiovascular: Negative for chest pain and syncope.  Gastrointestinal: Negative for abdominal pain and vomiting.  Genitourinary: Negative for dysuria.  Musculoskeletal: Negative for neck pain.  Skin: Negative for rash.  Neurological: Negative for headaches.    Physical Exam Updated Vital Signs BP (!) 154/72   Pulse 90   Temp 98.5 F (36.9 C) (Oral)   Resp 17   SpO2 99%   Physical Exam Vitals and nursing note reviewed.  Constitutional:      General: She is not in acute distress.    Appearance: She is well-developed.  HENT:     Head: Normocephalic and atraumatic.  Eyes:     Conjunctiva/sclera: Conjunctivae normal.  Cardiovascular:     Rate and Rhythm: Normal rate and regular rhythm.     Heart sounds: No murmur.  Pulmonary:     Effort: Pulmonary effort is normal. No respiratory distress.     Breath sounds: Wheezing (few scattered) present.  Abdominal:     Palpations: Abdomen is soft.     Tenderness: There is no abdominal tenderness.  Musculoskeletal:        General: No deformity or signs of injury. Normal range of motion.     Cervical back: Neck supple.     Right lower leg: No edema.     Left lower leg: No edema.  Skin:    General: Skin is warm and dry.     Capillary Refill: Capillary refill takes less than 2 seconds.  Neurological:     General: No focal deficit present.     Mental Status: She is alert.     Sensory: No sensory deficit.     Motor: No weakness.      ED Results / Procedures / Treatments   Labs (all labs ordered are listed, but only abnormal results are displayed) Labs Reviewed  BASIC METABOLIC PANEL - Abnormal; Notable for the following components:      Result Value   Glucose, Bld 117 (*)    GFR calc non Af Amer 54 (*)    All other components within normal limits  CBC WITH DIFFERENTIAL/PLATELET    EKG None  Radiology DG Chest Port 1 View  Result Date: 09/23/2019 CLINICAL DATA:  Shortness of breath EXAM: PORTABLE CHEST 1 VIEW COMPARISON:  08/14/2019 FINDINGS: There is hyperinflation of the lungs compatible with COPD. Heart and mediastinal contours are within normal limits. No focal opacities or effusions. No acute bony abnormality. IMPRESSION: COPD.  No active disease. Electronically Signed   By: Rolm Baptise M.D.   On: 09/23/2019 20:09    Procedures Procedures (including critical care time)  Medications Ordered in ED Medications  magnesium sulfate IVPB 2 g 50 mL (has no administration in time range)  methylPREDNISolone sodium succinate (SOLU-MEDROL) 125 mg/2 mL injection 125 mg (has no administration in time range)  albuterol (VENTOLIN HFA) 108 (90 Base) MCG/ACT inhaler 8 puff (has no administration in time range)    ED Course  I have reviewed the triage vital signs and the nursing notes.  Pertinent labs & imaging results that were available during my care of the patient were reviewed by me and considered in my medical decision making (see chart for details).  Clinical Course as of Sep 24 1454  Tue Sep 23, 2019  2013 Chest x-ray interpreted by me as COPD no infiltrates no pneumothorax.   [MB]  2148 Patient feels better after steroids and inhalation treatment, magnesium.  She is comfortable with discharge.  We talked about going on a burst of 4 more days of prednisone and to continue to use her inhaler and nebulizer.  She understands to return if any worsening symptoms.   [MB]  Wed Sep 24, 2019  0958 Neutrophils:  81 [MB]    Clinical Course User Index [MB] Hayden Rasmussen, MD   MDM Rules/Calculators/A&P                     This patient complains of shortness of breath; this involves an extensive number of treatment Options and is a complaint that carries with it a high risk of complications and Morbidity. The differential includes COPD exacerbation, asthma, pneumothorax, pneumonia, Covid, CHF  I ordered, reviewed and interpreted labs, which included WBC normal normal hemoglobin normal chemistries other than a mildly elevated glucose of 117 likely secondary to her steroid I ordered medication magnesium Solu-Medrol albuterol I ordered imaging studies which included chest x-ray and I independently    visualized and interpreted imaging which showed no infiltrates no pneumothorax Previous records obtained and reviewed in epic  After the interventions stated above, I reevaluated the patient and found patient to be breathing comfortably with normal saturations.  [name] was evaluated in Emergency Department on 09/23/2019 for the symptoms described in the history of present illness. She was evaluated in the context of the global COVID-19 pandemic, which necessitated consideration that the patient might be at risk for infection with the SARS-CoV-2 virus that causes COVID-19. Institutional protocols and algorithms that pertain to the evaluation of patients at risk for COVID-19 are in a state of rapid change based on information released by regulatory bodies including the CDC and federal and state organizations. These policies and algorithms were followed during the patient's care in the ED.   Final Clinical Impression(s) / ED Diagnoses Final diagnoses:  Chronic obstructive pulmonary disease with acute exacerbation ([place])    Rx / DC Orders ED Discharge Orders         Ordered    predniSONE (DELTASONE) 20 MG tablet  Daily     09/23/19 2149           Hayden Rasmussen, MD 09/24/19 1000     Hayden Rasmussen, MD 09/24/19 [phone]

## 2019-09-30 ENCOUNTER — Ambulatory Visit (INDEPENDENT_AMBULATORY_CARE_PROVIDER_SITE_OTHER): Payer: PPO | Admitting: Primary Care

## 2019-09-30 ENCOUNTER — Encounter: Payer: Self-pay | Admitting: Primary Care

## 2019-09-30 ENCOUNTER — Telehealth: Payer: Self-pay | Admitting: Primary Care

## 2019-09-30 ENCOUNTER — Other Ambulatory Visit: Payer: Self-pay

## 2019-09-30 DIAGNOSIS — J45909 Unspecified asthma, uncomplicated: Secondary | ICD-10-CM

## 2019-09-30 DIAGNOSIS — J449 Chronic obstructive pulmonary disease, unspecified: Secondary | ICD-10-CM

## 2019-09-30 MED ORDER — BUDESONIDE 0.25 MG/2ML IN SUSP: 0.2500 mg | Freq: Two times a day (BID) | RESPIRATORY_TRACT | 6 refills | Status: DC

## 2019-09-30 NOTE — Progress Notes (Signed)
Virtual Visit via Telephone Note  I connected with [name] on 09/30/19 at 11:30 AM EDT by telephone and verified that I am speaking with the correct person using two identifiers.  Location: Patient: Home Provider: Office   I discussed the limitations, risks, security and privacy concerns of performing an evaluation and management service by telephone and the availability of in person appointments. I also discussed with the patient that there may be a patient responsible charge related to this service. The patient expressed understanding and agreed to proceed.   History of Present Illness:  84 year old female, former remote smoker (0.5 pack year hx).  Past medical history significant for asthma, pleurisy, hypertension, TIA, GERD, myalgia.  Patient of Dr. Lamonte Sakai, last seen by pulmonary nurse practitioner on 08/20/2019 for video visit for thrush symptoms and given prescription for nystatin swish and swallow.  09/16/2019 Patient presents today for acute visit with complaints of recurrent thrush symptoms. She stopped using Dulera on March 22nd per Dr. Agustina Caroli recommendations. She has been using albuterol rescue inhaler more frequently since then. States that the nystatin s/s originally cleared her thrush symptoms earlier this month.    09/30/2019 Patient contacted today for acute visit. She was seen in ED COPD edacerbation.  She has history of recurrent thrush which has been partially treated with fluconazole and nystatin in the past. States that these medications have helped with her symptoms but resolved her thrush symptoms completely. She was on Symbicort for many years before it caused thrush. She was then given [facility] but this causes some nasal sores. She reports weight loss and weakness when she develops thrush symptoms. We have tried her off inhaled steriods but her breathing exacerbated. She was given 60mg  x4 days oral prednisone in ED. She is asking about whether she can stay on  prednisone long term.   Observations/Objective:  - No overt shortness of breath, wheezing or cough noted during phone conversation   Pulmonary function testing: 10/18/17 PFT- FVC 2.08 (146%), FEV1 1.13 (104%), ratio 54 +BD   Assessment and Plan:  Obstructive asthma:  - Intolerant to steriod inhaler d/t thrush symptoms  - Tried Stiolto but reported no benefit - Plan Pulmicort nebulizer 0.25mg /ml twice daily and Brovana 35mcg/2ml twice daily  - If she does not tolerate steroid nebulizer we will consider low dose daily prednisone  - Continue albuterol nebulizer/hfa every 4-6 hours for breakthrough shortness of breath or wheezing  - Refer to pulmonary rehab  Follow Up Instructions:  4-8 weeks with Dr. Lamonte Sakai or App   I discussed the assessment and treatment plan with the patient. The patient was provided an opportunity to ask questions and all were answered. The patient agreed with the plan and demonstrated an understanding of the instructions.   The patient was advised to call back or seek an in-person evaluation if the symptoms worsen or if the condition fails to improve as anticipated.  I provided 22 minutes of non-face-to-face time during this encounter.   Martyn Ehrich, NP

## 2019-09-30 NOTE — Patient Instructions (Addendum)
-   Pulmicort nebulizer 0.25mg /ml twice daily scheduled (steriod) - Brovana 7mcg/2ml nebulizer twice daily scheduled (long active bronchodilator) - Continue albuterol nebulizer/hfa every 4-6 hours as needed only for breakthrough shortness of breath or wheezing (short acting bronchodilator)  Refer: Pulmonary rehab   Follow-up: 1-2 months with DR. Byrum

## 2019-09-30 NOTE — Telephone Encounter (Signed)
Spoke with pt, she states they called her and told her what was going on. I told her to call back if [facility] doesn't cover Rx. Pt understood and nothing further is needed.

## 2019-10-01 ENCOUNTER — Telehealth: Payer: Self-pay | Admitting: Primary Care

## 2019-10-01 DIAGNOSIS — J45909 Unspecified asthma, uncomplicated: Secondary | ICD-10-CM

## 2019-10-01 MED ORDER — ARFORMOTEROL TARTRATE 15 MCG/2ML IN NEBU: 15.0000 ug | INHALATION_SOLUTION | Freq: Two times a day (BID) | RESPIRATORY_TRACT | 6 refills | Status: DC

## 2019-10-01 NOTE — Telephone Encounter (Signed)
Can you make sure we referred to pulmonary rehab FU in 1-2 months with [place]  Thanks

## 2019-10-03 ENCOUNTER — Encounter (HOSPITAL_COMMUNITY): Payer: Self-pay | Admitting: *Deleted

## 2019-10-03 ENCOUNTER — Telehealth: Payer: Self-pay | Admitting: Primary Care

## 2019-10-03 MED ORDER — PREDNISONE 5 MG PO TABS: 5.0000 mg | ORAL_TABLET | Freq: Every day | ORAL | 1 refills | Status: DC

## 2019-10-03 MED ORDER — NYSTATIN 100000 UNIT/ML MT SUSP: 5.0000 mL | Freq: Four times a day (QID) | OROMUCOSAL | 0 refills | Status: DC

## 2019-10-03 MED ORDER — ALBUTEROL SULFATE (2.5 MG/3ML) 0.083% IN NEBU: 2.5000 mg | INHALATION_SOLUTION | Freq: Four times a day (QID) | RESPIRATORY_TRACT | 5 refills | Status: DC

## 2019-10-03 NOTE — Progress Notes (Signed)
Received referral from Dr. Lamonte Sakai for this pt to participate in pulmonary rehab with the the diagnosis of Intrinsic Asthma. Clinical review of pt follow up appt on 4/13 with Geraldo Pitter Np with Dr. Lamonte Sakai Pulmonary office note.  Pt with Covid Risk Score - 5. Pt appropriate for scheduling for Pulmonary rehab.  Will forward to support staff for verification of insurance eligibility/benefits and pulmonary rehab staff for scheduling with pt consent. Cherre Huger, BSN Cardiac and Training and development officer

## 2019-10-03 NOTE — Telephone Encounter (Signed)
Referral placed and recall sent to make an appt with RB in 2 months. Nothing further is needed.

## 2019-10-03 NOTE — Telephone Encounter (Signed)
Called and spoke with pt letting her know the info stated by [facility] that we were stopping the two new neb sol and she verbalized understanding. Stated to pt that we were going to send rx for nystatin to help with the thrush in her mouth and also to have her start daily prednisone. Pt verbalized understanding. rxs sent to preferred pharmacy. Also stated to pt to make sure sue uses her albuterol neb sol four times daily. I have also scheduled pt a f/u with RB. Nothing further needed.

## 2019-10-03 NOTE — Telephone Encounter (Signed)
Discontinue pulmicort nebulizer and brovana. Please send in nystatin swish and swallow QID. Also start prednisone 5mg  daily. Make sure she has albuterol nebulizer and uses them scheduled QID. Please make her a visit with Dr. Lamonte Sakai next available.

## 2019-10-03 NOTE — Telephone Encounter (Signed)
Assessment and Plan from OV 4/13:  Obstructive asthma:  - Intolerant to steriod inhaler d/t thrush symptoms  - Tried Stiolto but reported no benefit - Plan Pulmicort nebulizer 0.25mg /ml twice daily and Brovana 92mcg/2ml twice daily  - If she does not tolerate steroid nebulizer we will consider low dose daily prednisone  - Continue albuterol nebulizer/hfa every 4-6 hours for breakthrough shortness of breath or wheezing  - Refer to pulmonary rehab  Follow Up Instructions:  4-8 weeks with Dr. Lamonte [name] or App  I discussed the assessment and treatment plan with the patient. The patient was provided an opportunity to ask questions and all were answered. The patient agreed with the plan and demonstrated an understanding of the instructions.  The patient was advised to call back or seek an in-person evaluation if the symptoms worsen or if the condition fails to improve as anticipated.    Called and spoke with pt who stated she has been having problems with thrush on her tongue and stated she also has an infection in her mouth and due to this, the new neb meds are not helping. Pt states the infection has been causing weakness as well as making her feel nauseous.  Pt wants recommendations for this. [name], please advise.

## 2019-10-06 ENCOUNTER — Telehealth (HOSPITAL_COMMUNITY): Payer: Self-pay

## 2019-10-06 DIAGNOSIS — J45909 Unspecified asthma, uncomplicated: Secondary | ICD-10-CM | POA: Diagnosis not present

## 2019-10-06 DIAGNOSIS — I10 Essential (primary) hypertension: Secondary | ICD-10-CM | POA: Diagnosis not present

## 2019-10-06 DIAGNOSIS — E7800 Pure hypercholesterolemia, unspecified: Secondary | ICD-10-CM | POA: Diagnosis not present

## 2019-10-06 NOTE — Telephone Encounter (Signed)
Pt insurance is active and benefits verified through [employer] adv Co-pay $15, DED 0/0 met, out of pocket $3,400/$400 met, co-insurance 0. no pre-authorization required, REF# [id number]

## 2019-10-08 ENCOUNTER — Telehealth (HOSPITAL_COMMUNITY): Payer: Self-pay | Admitting: *Deleted

## 2019-10-09 ENCOUNTER — Telehealth (HOSPITAL_COMMUNITY): Payer: Self-pay | Admitting: *Deleted

## 2019-10-09 NOTE — Telephone Encounter (Signed)
Returned [place] call. Pt wanted to set up Pulmonary Rehab orientation date and class time. Orientation date 11/05/2019 at 10:30 and class time to start 11/11/2019 at 10:15. I reviewed her insurance responsibility,that she would need to wear a mask, the appropriate shoes and the place she was to park.

## 2019-10-22 ENCOUNTER — Other Ambulatory Visit: Payer: Self-pay

## 2019-10-22 MED ORDER — ARFORMOTEROL TARTRATE 15 MCG/2ML IN NEBU: 15.0000 ug | INHALATION_SOLUTION | Freq: Two times a day (BID) | RESPIRATORY_TRACT | 11 refills | Status: DC

## 2019-11-04 ENCOUNTER — Telehealth (HOSPITAL_COMMUNITY): Payer: Self-pay | Admitting: *Deleted

## 2019-11-04 NOTE — Telephone Encounter (Signed)
Called to remind patient of appointment Wednesday, 11/05/2019 @ 1030 in pulmonary rehab for orientation walk test.  Patient confirmed appointment.

## 2019-11-05 ENCOUNTER — Encounter (HOSPITAL_COMMUNITY)
Admission: RE | Admit: 2019-11-05 | Discharge: 2019-11-05 | Disposition: A | Discharge status: Home / Self Care | Payer: Medicare Other | Source: Ambulatory Visit | Attending: Emergency Medicine | Admitting: Emergency Medicine

## 2019-11-05 ENCOUNTER — Other Ambulatory Visit: Payer: Self-pay

## 2019-11-05 VITALS — BP 118/60 | HR 68 | Ht 60.0 in | Wt 133.2 lb

## 2019-11-05 DIAGNOSIS — J45909 Unspecified asthma, uncomplicated: Secondary | ICD-10-CM | POA: Insufficient documentation

## 2019-11-05 NOTE — Progress Notes (Signed)
[name] 84 y.o. female Pulmonary Rehab Orientation Note Patient arrived today in Cardiac and Pulmonary Rehab for orientation to Pulmonary Rehab. She walked from the [place] deck without difficulty. She does not carry portable oxygen. Per pt, she uses oxygen never. Color good, skin warm and dry. Patient is oriented to time and place. Patient's medical history, psychosocial health, and medications reviewed. Psychosocial assessment reveals pt lives with their son, who is special needs and is 60 years old. Pt is currently retired. Pt reports her stress level is low. Pt does not exhibit signs of depression.  PHQ2/9 score 0/2. Pt shows good  coping skills with positive outlook .She does have some sleep issues, but that is not related to depression.  Will continue to monitor and evaluate progress toward psychosocial goal(s) of mental well being while participating in pulmonary rehab. Physical assessment reveals heart rate is normal, breath sounds clear to auscultation, no wheezes, rales, or rhonchi. Grip strength equal, strong. Distal pulses 2+ bilateral posterior tibial pulses present without peripheral edema. Patient reports she does take medications as prescribed. Patient states she follows a Regular diet. The patient reports no specific efforts to gain or lose weight.. Patient's weight will be monitored closely. Demonstration and practice of PLB using pulse oximeter. Patient able to return demonstration satisfactorily. Safety and hand hygiene in the exercise area reviewed with patient. Patient voices understanding of the information reviewed. Department expectations discussed with patient and achievable goals were set. The patient shows enthusiasm about attending the program and we look forward to working with this nice lady. The patient completed a 6 min walk test today, 11/05/2019 and to begin exercise on Thursday 11/13/2019 in the 1100 exercise slot.  1030-1200

## 2019-11-05 NOTE — Progress Notes (Signed)
Pulmonary Individual Treatment Plan  Patient Details  Name: [name] MRN: [id number] Date of Birth: Sep 26, 1935 Referring Provider:     Pulmonary Rehab Walk Test from 11/05/2019 in [facility]  Referring Provider  Dr. Vaughan Browner      Initial Encounter Date:    Pulmonary Rehab Walk Test from 11/05/2019 in [facility]  Date  11/05/19      Visit Diagnosis: Intrinsic asthma  Patient's Home Medications on Admission:   Current Outpatient Medications:  .  albuterol (PROVENTIL HFA;VENTOLIN HFA) 108 (90 Base) MCG/ACT inhaler, Inhale 2 puffs into the lungs every 6 (six) hours as needed for wheezing or shortness of breath., Disp: , Rfl:  .  albuterol (PROVENTIL) (2.5 MG/3ML) 0.083% nebulizer solution, Take 3 mLs (2.5 mg total) by nebulization 4 (four) times daily., Disp: 150 mL, Rfl: 5 .  Alpha-D-Galactosidase (BEANO PO), Take 1 capsule by mouth as needed (for gas and bloating). , Disp: , Rfl:  .  arformoterol (BROVANA) 15 MCG/2ML NEBU, Take 2 mLs (15 mcg total) by nebulization 2 (two) times daily. DX: J45.909, Disp: 120 mL, Rfl: 11 .  clopidogrel (PLAVIX) 75 MG tablet, Take 75 mg by mouth daily., Disp: , Rfl: 1 .  cycloSPORINE (RESTASIS) 0.05 % ophthalmic emulsion, Place 1 drop into both eyes 2 (two) times daily. , Disp: , Rfl:  .  Echinacea 125 MG CAPS, Take 125 mg by mouth daily., Disp: , Rfl:  .  EPINEPHrine 0.3 mg/0.3 mL IJ SOAJ injection, Inject 0.3 mg into the muscle once as needed (for a severe allergic reaction/use as directed). , Disp: , Rfl:  .  hydrochlorothiazide (MICROZIDE) 12.5 MG capsule, Take 12.5 mg by mouth daily. , Disp: , Rfl:  .  losartan (COZAAR) 50 MG tablet, Take 50 mg by mouth daily., Disp: , Rfl:  .  Multiple Vitamin (MULTIVITAMIN) tablet, Take 1 tablet by mouth daily. , Disp: , Rfl:  .  nystatin (MYCOSTATIN) 100000 UNIT/ML suspension, Take 5 mLs (500,000 Units total) by mouth 4 (four) times daily., Disp:  140 mL, Rfl: 0 .  omeprazole (PRILOSEC) 40 MG capsule, Take 1 capsule (40 mg total) by mouth daily. (Patient taking differently: Take 40 mg by mouth daily as needed. ), Disp: 90 capsule, Rfl: 3 .  predniSONE (DELTASONE) 5 MG tablet, Take 1 tablet (5 mg total) by mouth daily with breakfast., Disp: 30 tablet, Rfl: 1 .  Probiotic Product (PROBIOTIC PO), Take 1 capsule by mouth daily. , Disp: , Rfl:  .  Propylene Glycol (SYSTANE BALANCE) 0.6 % SOLN, Place 1 drop into both eyes 4 (four) times daily as needed (dry eyes)., Disp: , Rfl:  .  Respiratory Therapy Supplies (FLUTTER) DEVI, Use device 2-3 times a day to break up congestion, Disp: 1 each, Rfl: 0 .  Spacer/Aero-Holding Chambers (AEROCHAMBER MV) inhaler, Use as instructed, Disp: 1 each, Rfl: 0  Past Medical History: Past Medical History:  Diagnosis Date  . Arthritis   . Asthma   . Blood clotting disorder ([place])   . Cataract   . COPD (chronic obstructive pulmonary disease) ([place])   . Depression   . Diverticulosis 10-2011   Colonoscopy  . GERD (gastroesophageal reflux disease)   . Hemorrhoids, internal   . Hx of colonic polyp   . Hyperlipemia   . Hypertension   . IBS (irritable bowel syndrome)   . 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   EGD  . TIA (transient ischemic attack)  Tobacco Use: Social History   Tobacco Use  Smoking Status Former Smoker  . Packs/day: 0.50  . Types: Cigarettes  . Quit date: 09/28/1959  . Years since quitting: 60.1  Smokeless Tobacco Never Used    Labs: Recent Chemical engineer    Labs for ITP Cardiac and Pulmonary Rehab Latest Ref Rng & Units 12/11/2006 03/13/2014 10/16/2014   Cholestrol 0 - 200 mg/dL - 187 -   LDLCALC 0 - 99 mg/dL - 118(H) -   HDL >39 mg/dL - 57 -   Trlycerides <150 mg/dL - 59 -   Hemoglobin A1c <5.7 % - 5.8(H) -   HCO3 - 28.1(H) - -   TCO2 0 - 100 mmol/L 30 - 24      Capillary Blood Glucose: Lab Results  Component Value Date   GLUCAP 107 (H) 10/16/2014   GLUCAP 71  10/16/2014   GLUCAP 87 03/14/2014     Pulmonary Assessment Scores: Pulmonary Assessment Scores    Row Name 11/05/19 1101 11/05/19 1201       ADL UCSD   ADL Phase  Entry  --    SOB Score total  58  --      CAT Score   CAT Score  31  --      mMRC Score   mMRC Score  --  1      UCSD: Self-administered rating of dyspnea associated with activities of daily living (ADLs) 6-point scale (0 = "not at all" to 5 = "maximal or unable to do because of breathlessness")  Scoring Scores range from 0 to 120.  Minimally important difference is 5 units  CAT: CAT can identify the health impairment of COPD patients and is better correlated with disease progression.  CAT has a scoring range of zero to 40. The CAT score is classified into four groups of low (less than 10), medium (10 - 20), high (21-30) and very high (31-40) based on the impact level of disease on health status. A CAT score over 10 suggests significant symptoms.  A worsening CAT score could be explained by an exacerbation, poor medication adherence, poor inhaler technique, or progression of COPD or comorbid conditions.  CAT MCID is 2 points  mMRC: mMRC (Modified Medical Research Council) Dyspnea Scale is used to assess the degree of baseline functional disability in patients of respiratory disease due to dyspnea. No minimal important difference is established. A decrease in score of 1 point or greater is considered a positive change.   Pulmonary Function Assessment:   Exercise Target Goals: Exercise Program Goal: Individual exercise prescription set using results from initial 6 min walk test and THRR while considering  patient's activity barriers and safety.   Exercise Prescription Goal: Initial exercise prescription builds to 30-45 minutes a day of aerobic activity, 2-3 days per week.  Home exercise guidelines will be given to patient during program as part of exercise prescription that the participant will  acknowledge.  Activity Barriers & Risk Stratification: Activity Barriers & Cardiac Risk Stratification - 11/05/19 1058      Activity Barriers & Cardiac Risk Stratification   Activity Barriers  Neck/Spine Problems;Deconditioning;Muscular Weakness;Shortness of Breath   suffers from spinal stenosis      6 Minute Walk: 6 Minute Walk    Row Name 11/05/19 1202         6 Minute Walk   Phase  Initial     Distance  1102 feet     Walk Time  6 minutes     #  of Rest Breaks  0     MPH  2.09     METS  1.65     RPE  13     Perceived Dyspnea   2.5     VO2 Peak  5.77     Symptoms  No     Resting HR  66 bpm     Resting BP  118/60     Resting Oxygen Saturation   99 %     Exercise Oxygen Saturation  during 6 min walk  91 %     Max Ex. HR  81 bpm     Max Ex. BP  132/70     2 Minute Post BP  106/74       Interval HR   1 Minute HR  79     2 Minute HR  80     3 Minute HR  81     4 Minute HR  79     5 Minute HR  81     6 Minute HR  80     2 Minute Post HR  69     Interval Heart Rate?  Yes       Interval Oxygen   Interval Oxygen?  Yes     Baseline Oxygen Saturation %  99 %     1 Minute Oxygen Saturation %  99 %     1 Minute Liters of Oxygen  0 L     2 Minute Oxygen Saturation %  100 %     2 Minute Liters of Oxygen  0 L     3 Minute Oxygen Saturation %  99 %     3 Minute Liters of Oxygen  0 L     4 Minute Oxygen Saturation %  97 %     4 Minute Liters of Oxygen  0 L     5 Minute Oxygen Saturation %  93 %     5 Minute Liters of Oxygen  0 L     6 Minute Oxygen Saturation %  91 %     6 Minute Liters of Oxygen  0 L     2 Minute Post Oxygen Saturation %  100 %     2 Minute Post Liters of Oxygen  0 L        Oxygen Initial Assessment: Oxygen Initial Assessment - 11/05/19 1201      Home Oxygen   Home Oxygen Device  None    Sleep Oxygen Prescription  None    Home Exercise Oxygen Prescription  None    Home at Rest Exercise Oxygen Prescription  None      Initial 6 min Walk    Oxygen Used  None      Program Oxygen Prescription   Program Oxygen Prescription  None      Intervention   Short Term Goals  To learn and demonstrate proper use of respiratory medications;To learn and demonstrate proper pursed lip breathing techniques or other breathing techniques.;To learn and understand importance of maintaining oxygen saturations>88%;To learn and understand importance of monitoring SPO2 with pulse oximeter and demonstrate accurate use of the pulse oximeter.;To learn and exhibit compliance with exercise, home and travel O2 prescription    Long  Term Goals  Exhibits compliance with exercise, home and travel O2 prescription;Verbalizes importance of monitoring SPO2 with pulse oximeter and return demonstration;Maintenance of O2 saturations>88%;Exhibits proper breathing techniques, such as pursed lip breathing or other method taught during program session;Compliance  with respiratory medication;Demonstrates proper use of MDI's       Oxygen Re-Evaluation:   Oxygen Discharge (Final Oxygen Re-Evaluation):   Initial Exercise Prescription: Initial Exercise Prescription - 11/05/19 1200      Date of Initial Exercise RX and Referring Provider   Date  11/05/19    Referring Provider  Dr. Vaughan Browner      T5 Nustep   Level  2    SPM  80    Minutes  15      Track   Laps  9    Minutes  15      Prescription Details   Frequency (times per week)  2    Duration  Progress to 30 minutes of continuous aerobic without signs/symptoms of physical distress      Intensity   THRR 40-80% of Max Heartrate  55-110    Ratings of Perceived Exertion  11-13    Perceived Dyspnea  0-4      Resistance Training   Training Prescription  Yes    Weight  orange bands    Reps  10-15       Perform Capillary Blood Glucose checks as needed.  Exercise Prescription Changes:   Exercise Comments:   Exercise Goals and Review: Exercise Goals    Row Name 11/05/19 1209             Exercise  Goals   Increase Physical Activity  Yes       Intervention  Provide advice, education, support and counseling about physical activity/exercise needs.;Develop an individualized exercise prescription for aerobic and resistive training based on initial evaluation findings, risk stratification, comorbidities and participant's personal goals.       Expected Outcomes  Short Term: Attend rehab on a regular basis to increase amount of physical activity.;Long Term: Add in home exercise to make exercise part of routine and to increase amount of physical activity.;Long Term: Exercising regularly at least 3-5 days a week.       Increase Strength and Stamina  Yes       Intervention  Provide advice, education, support and counseling about physical activity/exercise needs.;Develop an individualized exercise prescription for aerobic and resistive training based on initial evaluation findings, risk stratification, comorbidities and participant's personal goals.       Expected Outcomes  Short Term: Increase workloads from initial exercise prescription for resistance, speed, and METs.;Short Term: Perform resistance training exercises routinely during rehab and add in resistance training at home;Long Term: Improve cardiorespiratory fitness, muscular endurance and strength as measured by increased METs and functional capacity (6MWT)       Able to understand and use rate of perceived exertion (RPE) scale  Yes       Intervention  Provide education and explanation on how to use RPE scale       Expected Outcomes  Short Term: Able to use RPE daily in rehab to express subjective intensity level;Long Term:  Able to use RPE to guide intensity level when exercising independently       Able to understand and use Dyspnea scale  Yes       Intervention  Provide education and explanation on how to use Dyspnea scale       Expected Outcomes  Short Term: Able to use Dyspnea scale daily in rehab to express subjective sense of shortness of  breath during exertion;Long Term: Able to use Dyspnea scale to guide intensity level when exercising independently       Knowledge and understanding of  Target Heart Rate Range (THRR)  Yes       Intervention  Provide education and explanation of THRR including how the numbers were predicted and where they are located for reference       Expected Outcomes  Short Term: Able to state/look up THRR;Short Term: Able to use daily as guideline for intensity in rehab;Long Term: Able to use THRR to govern intensity when exercising independently       Understanding of Exercise Prescription  Yes       Intervention  Provide education, explanation, and written materials on patient's individual exercise prescription       Expected Outcomes  Short Term: Able to explain program exercise prescription;Long Term: Able to explain home exercise prescription to exercise independently          Exercise Goals Re-Evaluation :   Discharge Exercise Prescription (Final Exercise Prescription Changes):   Nutrition:  Target Goals: Understanding of nutrition guidelines, daily intake of sodium 1500mg , cholesterol 200mg , calories 30% from fat and 7% or less from saturated fats, daily to have 5 or more servings of fruits and vegetables.  Biometrics: Pre Biometrics - 11/05/19 1059      Pre Biometrics   Height  5' (1.524 m)    Weight  60.4 kg    BMI (Calculated)  26.01    Grip Strength  22.5 kg        Nutrition Therapy Plan and Nutrition Goals:   Nutrition Assessments:   Nutrition Goals Re-Evaluation:   Nutrition Goals Discharge (Final Nutrition Goals Re-Evaluation):   Psychosocial: Target Goals: Acknowledge presence or absence of significant depression and/or stress, maximize coping skills, provide positive support system. Participant is able to verbalize types and ability to use techniques and skills needed for reducing stress and depression.  Initial Review & Psychosocial Screening: Initial Psych Review  & Screening - 11/05/19 1104      Initial Review   Current issues with  None Identified      Family Dynamics   Good Support System?  Yes      Barriers   Psychosocial barriers to participate in program  There are no identifiable barriers or psychosocial needs.      Screening Interventions   Interventions  Encouraged to exercise       Quality of Life Scores:  Scores of 19 and below usually indicate a poorer quality of life in these areas.  A difference of  2-3 points is a clinically meaningful difference.  A difference of 2-3 points in the total score of the Quality of Life Index has been associated with significant improvement in overall quality of life, self-image, physical symptoms, and general health in studies assessing change in quality of life.  PHQ-9: Recent Review Flowsheet Data    Depression screen [facility] 2/9 11/05/2019 07/01/2018   Decreased Interest 0 0   Down, Depressed, Hopeless 0 0   PHQ - 2 Score 0 0   Altered sleeping 2 -   Tired, decreased energy 0 -   Change in appetite 0 -   Feeling bad or failure about yourself  0 -   Trouble concentrating 0 -   Moving slowly or fidgety/restless 0 -   Suicidal thoughts 0 -   PHQ-9 Score 2 -   Difficult doing work/chores Not difficult at all -     Interpretation of Total Score  Total Score Depression Severity:  1-4 = Minimal depression, 5-9 = Mild depression, 10-14 = Moderate depression, 15-19 = Moderately severe depression, 20-27 =  Severe depression   Psychosocial Evaluation and Intervention: Psychosocial Evaluation - 11/05/19 1128      Psychosocial Evaluation & Interventions   Interventions  Encouraged to exercise with the program and follow exercise prescription    Comments  No concerns identified    Continue Psychosocial Services   No Follow up required       Psychosocial Re-Evaluation:   Psychosocial Discharge (Final Psychosocial Re-Evaluation):   Education: Education Goals: Education classes will be provided  on a weekly basis, covering required topics. Participant will state understanding/return demonstration of topics presented.  Learning Barriers/Preferences: Learning Barriers/Preferences - 11/05/19 1128      Learning Barriers/Preferences   Learning Barriers  None    Learning Preferences  Computer/Internet;Skilled Demonstration;Verbal Instruction;Video;Written Material       Education Topics: Risk Factor Reduction:  -Group instruction that is supported by a PowerPoint presentation. Instructor discusses the definition of a risk factor, different risk factors for pulmonary disease, and how the heart and lungs work together.     Nutrition for Pulmonary Patient:  -Group instruction provided by PowerPoint slides, verbal discussion, and written materials to support subject matter. The instructor gives an explanation and review of healthy diet recommendations, which includes a discussion on weight management, recommendations for fruit and vegetable consumption, as well as protein, fluid, caffeine, fiber, sodium, sugar, and alcohol. Tips for eating when patients are short of breath are discussed.   Pursed Lip Breathing:  -Group instruction that is supported by demonstration and informational handouts. Instructor discusses the benefits of pursed lip and diaphragmatic breathing and detailed demonstration on how to preform both.     Oxygen Safety:  -Group instruction provided by PowerPoint, verbal discussion, and written material to support subject matter. There is an overview of "What is Oxygen" and "Why do we need it".  Instructor also reviews how to create a safe environment for oxygen use, the importance of using oxygen as prescribed, and the risks of noncompliance. There is a brief discussion on traveling with oxygen and resources the patient may utilize.   Oxygen Equipment:  -Group instruction provided by [facility] Staff utilizing handouts, written materials, and equipment  demonstrations.   Signs and Symptoms:  -Group instruction provided by written material and verbal discussion to support subject matter. Warning signs and symptoms of infection, stroke, and heart attack are reviewed and when to call the physician/911 reinforced. Tips for preventing the spread of infection discussed.   Advanced Directives:  -Group instruction provided by verbal instruction and written material to support subject matter. Instructor reviews Advanced Directive laws and proper instruction for filling out document.   Pulmonary Video:  -Group video education that reviews the importance of medication and oxygen compliance, exercise, good nutrition, pulmonary hygiene, and pursed lip and diaphragmatic breathing for the pulmonary patient.   Exercise for the Pulmonary Patient:  -Group instruction that is supported by a PowerPoint presentation. Instructor discusses benefits of exercise, core components of exercise, frequency, duration, and intensity of an exercise routine, importance of utilizing pulse oximetry during exercise, safety while exercising, and options of places to exercise outside of rehab.     Pulmonary Medications:  -Verbally interactive group education provided by instructor with focus on inhaled medications and proper administration.   Anatomy and Physiology of the Respiratory System and Intimacy:  -Group instruction provided by PowerPoint, verbal discussion, and written material to support subject matter. Instructor reviews respiratory cycle and anatomical components of the respiratory system and their functions. Instructor also reviews differences in obstructive and  restrictive respiratory diseases with examples of each. Intimacy, Sex, and Sexuality differences are reviewed with a discussion on how relationships can change when diagnosed with pulmonary disease. Common sexual concerns are reviewed.   MD DAY -A group question and answer session with a medical doctor  that allows participants to ask questions that relate to their pulmonary disease state.   OTHER EDUCATION -Group or individual verbal, written, or video instructions that support the educational goals of the pulmonary rehab program.   Holiday Eating Survival Tips:  -Group instruction provided by PowerPoint slides, verbal discussion, and written materials to support subject matter. The instructor gives patients tips, tricks, and techniques to help them not only survive but enjoy the holidays despite the onslaught of food that accompanies the holidays.   Knowledge Questionnaire Score: Knowledge Questionnaire Score - 11/05/19 1128      Knowledge Questionnaire Score   Pre Score  15/18       Core Components/Risk Factors/Patient Goals at Admission: Personal Goals and Risk Factors at Admission - 11/05/19 1153      Core Components/Risk Factors/Patient Goals on Admission   Improve shortness of breath with ADL's  Yes    Intervention  Provide education, individualized exercise plan and daily activity instruction to help decrease symptoms of SOB with activities of daily living.    Expected Outcomes  Short Term: Improve cardiorespiratory fitness to achieve a reduction of symptoms when performing ADLs;Long Term: Be able to perform more ADLs without symptoms or delay the onset of symptoms       Core Components/Risk Factors/Patient Goals Review:  Goals and Risk Factor Review    Row Name 11/05/19 1129 11/05/19 1153           Core Components/Risk Factors/Patient Goals Review   Personal Goals Review  Improve shortness of breath with ADL's;Develop more efficient breathing techniques such as purse lipped breathing and diaphragmatic breathing and practicing self-pacing with activity.;Increase knowledge of respiratory medications and ability to use respiratory devices properly.  Increase knowledge of respiratory medications and ability to use respiratory devices properly.;Improve shortness of breath  with ADL's;Develop more efficient breathing techniques such as purse lipped breathing and diaphragmatic breathing and practicing self-pacing with activity.         Core Components/Risk Factors/Patient Goals at Discharge (Final Review):  Goals and Risk Factor Review - 11/05/19 1153      Core Components/Risk Factors/Patient Goals Review   Personal Goals Review  Increase knowledge of respiratory medications and ability to use respiratory devices properly.;Improve shortness of breath with ADL's;Develop more efficient breathing techniques such as purse lipped breathing and diaphragmatic breathing and practicing self-pacing with activity.       ITP Comments:   Comments:

## 2019-11-06 ENCOUNTER — Encounter: Payer: Self-pay | Admitting: Emergency Medicine

## 2019-11-06 ENCOUNTER — Telehealth: Payer: Self-pay | Admitting: Emergency Medicine

## 2019-11-06 ENCOUNTER — Ambulatory Visit: Payer: Medicare Other | Admitting: Emergency Medicine

## 2019-11-06 DIAGNOSIS — J45909 Unspecified asthma, uncomplicated: Secondary | ICD-10-CM | POA: Diagnosis not present

## 2019-11-06 DIAGNOSIS — B370 Candidal stomatitis: Secondary | ICD-10-CM

## 2019-11-06 NOTE — Assessment & Plan Note (Signed)
Please continue your Symbicort 2 puffs twice a day for now.  Rinse and gargle well after using. Keep your albuterol available to use 2 puffs if needed for shortness of breath, chest tightness, wheezing. Follow with Dr. Lamonte Sakai in 2 months or sooner if you have any problems.

## 2019-11-06 NOTE — Patient Instructions (Signed)
Please continue your Symbicort 2 puffs twice a day for now.  Rinse and gargle well after using. Keep your albuterol available to use 2 puffs if needed for shortness of breath, chest tightness, wheezing. Unclear whether your tongue and mouth symptoms reflect recurrent thrush or just chronic irritation.  I would like for you to see ENT to evaluate and discuss strategies to make this better.  We will make the referral today. Follow with Dr. Lamonte Sakai in 2 months or sooner if you have any problems.

## 2019-11-06 NOTE — Telephone Encounter (Signed)
Attempted to call pt but unable to reach. Left message for her to return call. 

## 2019-11-06 NOTE — Addendum Note (Signed)
Addended by: Gavin Potters R on: 11/06/2019 01:34 PM   Modules accepted: Orders

## 2019-11-06 NOTE — Assessment & Plan Note (Addendum)
She clearly has chronic tongue irritation, pain but it is not clear to me that this has always been thrush.  Wonder whether the varied inhalers that we have tried are just contributing to irritation and pain.  I like for her to see ENT to help [place] figure this out.  If she is having persistent/recurrent thrush then there may be a regimen we can use for maintenance that will prevent it.  Every time we have had her on an alternative bronchodilator that does not include an ICS she has had dyspnea, flaring.  Unclear whether your tongue and mouth symptoms reflect recurrent thrush or just chronic irritation.  I would like for you to see ENT to evaluate and discuss strategies to make this better.  We will make the referral today.

## 2019-11-06 NOTE — Progress Notes (Signed)
Subjective:    Patient ID: [name], female    DOB: 1935/11/18, 84 y.o.   MRN: [id number]  Asthma Her past medical history is significant for asthma.   ROV 07/21/2019 --[name] is 82, follows up today for her history of COPD with bronchodilator responsiveness and asthmatic component.  She also has intermittent allergic rhinitis, GERD.  Last seen here 1 year ago when she was experiencing flaring symptoms in the setting of influenza A, was treated with a prednisone taper. She improved but it took until March. No other flares since then.  She reports today that she has been having leg, thigh muscle soreness, feels cold, has had weakness, wt loss. She is planning to f/u w Dr Drema Dallas about this w labs in 2 weeks.  She is having a lot of tongue irritation with her Symbicort, although she knows that it helps her breathing. Started last year - sometimes her tongue turns red, can be coated with white. Has caused her to change to qod on occasion. Uses albuterol about every other day. She reports dry mouth and dry eyes, ? Contributing to the tongue. She has had cough in past w powdered inhalers.   ROV 11/06/19 --[name] is 3, has a history of COPD/asthma with a positive bronchodilator response, intermittent allergic rhinitis and GERD.  We have been managing her on ICS/LABA but she is had significant difficulty with throat, tongue irritation, possible thrush.  She was changed to [facility] in late March to see if this would help and be easier to tolerate.  She had acute exacerbation of COPD 09/23/2019 and was treated with steroids.  Most recently she was tried on Brovana, was unable to get budesonide so she took low dose pred for a couple weeks, now off.  She has had to go back to generic symbicort. She does rinse and gargle after. She uses albuterol about 0-1x a day.   MDM: Reviewed ED notes from 09/23/2019 Reviewed pulmonary office and phone notes from [place] chest x-ray from  09/23/2019  Review of Systems Per HPI      Objective:   Physical Exam Vitals:   11/06/19 1125  BP: 110/64  Pulse: 75  Temp: 97.8 F (36.6 C)  TempSrc: Temporal  SpO2: 100%  Weight: 132 lb 12.8 oz (60.2 kg)   Gen: Pleasant, elderly woman, in no distress,  normal affect  ENT: No lesions,  mouth clear, she has some hypopigmentation, rawness on her tongue, no plaques.   Neck: No JVD, no stridor  Lungs: No use of accessory muscles, distant, no wheeze or crackles  Cardiovascular: RRR, heart sounds normal, no murmur or gallops, no peripheral edema  Musculoskeletal: No deformities, no cyanosis or clubbing  Neuro: alert, non focal  Skin: Warm, no lesions or rashes      Assessment & Plan:  Thrush She clearly has chronic tongue irritation, pain but it is not clear to me that this has always been thrush.  Wonder whether the varied inhalers that we have tried are just contributing to irritation and pain.  I like for her to see ENT to help [place] figure this out.  If she is having persistent/recurrent thrush then there may be a regimen we can use for maintenance that will prevent it.  Every time we have had her on an alternative bronchodilator that does not include an ICS she has had dyspnea, flaring.  Unclear whether your tongue and mouth symptoms reflect recurrent thrush or just chronic irritation.  I would like for you to see ENT to evaluate and discuss strategies to make this better.  We will make the referral today.  Intrinsic asthma Please continue your Symbicort 2 puffs twice a day for now.  Rinse and gargle well after using. Keep your albuterol available to use 2 puffs if needed for shortness of breath, chest tightness, wheezing. Follow with Dr. Lamonte Sakai in 2 months or sooner if you have any problems.   Baltazar Apo, MD, PhD 11/06/2019, 12:05 PM [facility] [phone] or if no answer [phone]

## 2019-11-07 MED ORDER — BUDESONIDE-FORMOTEROL FUMARATE 160-4.5 MCG/ACT IN AERO: 2.0000 | INHALATION_SPRAY | Freq: Two times a day (BID) | RESPIRATORY_TRACT | 6 refills | Status: DC

## 2019-11-07 NOTE — Telephone Encounter (Signed)
Pt calling about getting a refill on her Symbicort and Nystatin. Pharmacy of choice is [facility] on [address]. Pt can reached at [phone]

## 2019-11-07 NOTE — Telephone Encounter (Signed)
LMTCB x2  

## 2019-11-07 NOTE — Telephone Encounter (Signed)
Spoke with pt. She is needing a refill on Symbicort. Rx has been sent in. Nothing further was needed. 

## 2019-11-13 ENCOUNTER — Other Ambulatory Visit: Payer: Self-pay

## 2019-11-13 ENCOUNTER — Encounter (HOSPITAL_COMMUNITY)
Admission: RE | Admit: 2019-11-13 | Discharge: 2019-11-13 | Disposition: A | Discharge status: Home / Self Care | Payer: Medicare Other | Source: Ambulatory Visit | Attending: Emergency Medicine | Admitting: Emergency Medicine

## 2019-11-13 VITALS — Wt 134.3 lb

## 2019-11-13 DIAGNOSIS — J45909 Unspecified asthma, uncomplicated: Secondary | ICD-10-CM | POA: Diagnosis not present

## 2019-11-13 NOTE — Progress Notes (Signed)
Daily Session Note  Patient Details  Name: [name] MRN: [id number] Date of Birth: 02/09/1936 Referring Provider:     Pulmonary Rehab Walk Test from 11/05/2019 in [place]  Referring Provider  Dr. Vaughan Browner      Encounter Date: 11/13/2019  Check In: Session Check In - 11/13/19 1042      Check-In   Supervising physician immediately available to respond to emergencies  [facility] immediately available    Physician(s)  Dr. Broadus John    Location  [facility]    Staff Present  Rosebud Poles, RN, Bjorn Loser, MS, CEP, Exercise Physiologist;Zanyiah Posten Ysidro Evert, RN    Virtual Visit  No    Medication changes reported      No    Fall or balance concerns reported     No    Tobacco Cessation  No Change    Warm-up and Cool-down  Performed on first and last piece of equipment    Resistance Training Performed  Yes    VAD Patient?  No    PAD/SET Patient?  No      Pain Assessment   Currently in Pain?  No/denies    Multiple Pain Sites  No       Capillary Blood Glucose: No results found for this or any previous visit (from the past 24 hour(s)).    Social History   Tobacco Use  Smoking Status Former Smoker  . Packs/day: 0.50  . Types: Cigarettes  . Quit date: 09/28/1959  . Years since quitting: 60.1  Smokeless Tobacco Never Used    Goals Met:  Exercise tolerated well No report of cardiac concerns or symptoms Strength training completed today  Goals Unmet:  Not Applicable  Comments: Service time is from 1045 to 1145    Dr. Fransico Him is Medical Director for Cardiac Rehab at [facility].

## 2019-11-18 ENCOUNTER — Telehealth (HOSPITAL_COMMUNITY): Payer: Self-pay | Admitting: Family Medicine

## 2019-11-18 ENCOUNTER — Encounter (HOSPITAL_COMMUNITY): Payer: Medicare Other

## 2019-11-20 ENCOUNTER — Telehealth (HOSPITAL_COMMUNITY): Payer: Self-pay | Admitting: Family Medicine

## 2019-11-20 ENCOUNTER — Encounter (HOSPITAL_COMMUNITY): Payer: Medicare Other

## 2019-11-25 ENCOUNTER — Encounter (HOSPITAL_COMMUNITY): Payer: Medicare Other

## 2019-11-27 ENCOUNTER — Encounter (HOSPITAL_COMMUNITY): Payer: Medicare Other

## 2019-11-28 ENCOUNTER — Telehealth: Payer: Self-pay | Admitting: Emergency Medicine

## 2019-11-28 MED ORDER — NYSTATIN 100000 UNIT/ML MT SUSP: 5.0000 mL | Freq: Three times a day (TID) | OROMUCOSAL | 0 refills | Status: DC

## 2019-11-28 NOTE — Telephone Encounter (Signed)
Spoke with patient. She stated that she has developed thrush again. She is still using her Symbicort and rinsing out her mouth after each puff.   She wants to know if she can get a refill on her Nystatin solution. [facility] [address].   RB, please advise. Thanks!

## 2019-11-28 NOTE — Telephone Encounter (Signed)
Spoke with patient. She is aware that RB has approved the RX. RX has been sent to [facility] for her.   Nothing further needed at time of call.

## 2019-11-28 NOTE — Telephone Encounter (Signed)
Pt returning missed call. Can be reached at [phone]

## 2019-11-28 NOTE — Telephone Encounter (Signed)
ATC pt, no answer. Left message for pt to call back.    Patient Instructions by Collene Gobble, MD at 11/06/2019 11:30 AM Author: Collene Gobble, MD Author Type: Physician Filed: 11/06/2019 12:03 PM  Note Status: Signed Cosign: Cosign Not Required Encounter Date: 11/06/2019  Editor: Collene Gobble, MD (Physician)                 Please continue your Symbicort 2 puffs twice a day for now.  Rinse and gargle well after using. Keep your albuterol available to use 2 puffs if needed for shortness of breath, chest tightness, wheezing. Unclear whether your tongue and mouth symptoms reflect recurrent thrush or just chronic irritation.  I would like for you to see ENT to evaluate and discuss strategies to make this better.  We will make the referral today. Follow with Dr. Lamonte Sakai in 2 months or sooner if you have any problems.

## 2019-11-28 NOTE — Telephone Encounter (Signed)
Yes ok to order the nystatin > use tid for 3-4 days.

## 2019-12-01 ENCOUNTER — Telehealth (HOSPITAL_COMMUNITY): Payer: Self-pay | Admitting: *Deleted

## 2019-12-01 NOTE — Telephone Encounter (Signed)
Called to check on patient, she attended 1 exercise session of pulmonary rehab and developed leg numbness.  She went for evaluation to orthopedics and was instructed to attend physical therapy to strengthen her back and to be discharged from pulmonary rehab which I have done.

## 2019-12-02 ENCOUNTER — Encounter (HOSPITAL_COMMUNITY): Payer: Medicare Other

## 2019-12-04 ENCOUNTER — Encounter (HOSPITAL_COMMUNITY): Payer: Medicare Other

## 2019-12-08 NOTE — Addendum Note (Signed)
Encounter addended by: Lance Morin, RN on: 12/08/2019 8:48 AM  Actions taken: Clinical Note Signed, Episode resolved

## 2019-12-08 NOTE — Progress Notes (Signed)
Discharge Progress Report  Patient Details  Name: [name] MRN: [id number] Date of Birth: 01-May-1936 Referring Provider:     Pulmonary Rehab Walk Test from 11/05/2019 in [place]  Referring Provider Dr. Vaughan Browner       Number of Visits: 1  Reason for Discharge:  Early Exit:  Due to back pain and leg numbness that is chronic, orthopedics suggest she go through physical therapy and drop out of pulmonary rehab.  Smoking History:  Social History   Tobacco Use  Smoking Status Former Smoker  . Packs/day: 0.50  . Types: Cigarettes  . Quit date: 09/28/1959  . Years since quitting: 60.2  Smokeless Tobacco Never Used    Diagnosis:  Intrinsic asthma  ADL UCSD:  Pulmonary Assessment Scores    Row Name 11/05/19 1101 11/05/19 1201       ADL UCSD   ADL Phase Entry --    SOB Score total 58 --      CAT Score   CAT Score 31 --      mMRC Score   mMRC Score -- 1           Initial Exercise Prescription:  Initial Exercise Prescription - 11/05/19 1200      Date of Initial Exercise RX and Referring Provider   Date 11/05/19    Referring Provider Dr. Vaughan Browner      T5 Nustep   Level 2    SPM 80    Minutes 15      Track   Laps 9    Minutes 15      Prescription Details   Frequency (times per week) 2    Duration Progress to 30 minutes of continuous aerobic without signs/symptoms of physical distress      Intensity   THRR 40-80% of Max Heartrate 55-110    Ratings of Perceived Exertion 11-13    Perceived Dyspnea 0-4      Resistance Training   Training Prescription Yes    Weight orange bands    Reps 10-15           Discharge Exercise Prescription (Final Exercise Prescription Changes):  Exercise Prescription Changes - 11/13/19 1203      Response to Exercise   Blood Pressure (Admit) 114/64    Blood Pressure (Exercise) 130/50    Blood Pressure (Exit) 118/70    Heart Rate (Admit) 63 bpm    Heart Rate (Exercise) 76 bpm     Heart Rate (Exit) 70 bpm    Oxygen Saturation (Admit) 98 %    Oxygen Saturation (Exercise) 98 %    Oxygen Saturation (Exit) 98 %    Rating of Perceived Exertion (Exercise) 13    Perceived Dyspnea (Exercise) 1    Duration Continue with 30 min of aerobic exercise without signs/symptoms of physical distress.    Intensity --   40-80% HRR     Resistance Training   Training Prescription Yes    Weight orange bands    Reps 10-15    Time 10 Minutes      T5 Nustep   Level 2    SPM 80    Minutes 30    METs 1.8           Functional Capacity:  6 Minute Walk    Row Name 11/05/19 1202         6 Minute Walk   Phase Initial     Distance 1102 feet     Walk Time  6 minutes     # of Rest Breaks 0     MPH 2.09     METS 1.65     RPE 13     Perceived Dyspnea  2.5     VO2 Peak 5.77     Symptoms No     Resting HR 66 bpm     Resting BP 118/60     Resting Oxygen Saturation  99 %     Exercise Oxygen Saturation  during 6 min walk 91 %     Max Ex. HR 81 bpm     Max Ex. BP 132/70     2 Minute Post BP 106/74       Interval HR   1 Minute HR 79     2 Minute HR 80     3 Minute HR 81     4 Minute HR 79     5 Minute HR 81     6 Minute HR 80     2 Minute Post HR 69     Interval Heart Rate? Yes       Interval Oxygen   Interval Oxygen? Yes     Baseline Oxygen Saturation % 99 %     1 Minute Oxygen Saturation % 99 %     1 Minute Liters of Oxygen 0 L     2 Minute Oxygen Saturation % 100 %     2 Minute Liters of Oxygen 0 L     3 Minute Oxygen Saturation % 99 %     3 Minute Liters of Oxygen 0 L     4 Minute Oxygen Saturation % 97 %     4 Minute Liters of Oxygen 0 L     5 Minute Oxygen Saturation % 93 %     5 Minute Liters of Oxygen 0 L     6 Minute Oxygen Saturation % 91 %     6 Minute Liters of Oxygen 0 L     2 Minute Post Oxygen Saturation % 100 %     2 Minute Post Liters of Oxygen 0 L            Psychological, QOL, Others - Outcomes: PHQ 2/9: Depression screen [facility] 2/9  11/05/2019 07/01/2018  Decreased Interest 0 0  Down, Depressed, Hopeless 0 0  PHQ - 2 Score 0 0  Altered sleeping 2 -  Tired, decreased energy 0 -  Change in appetite 0 -  Feeling bad or failure about yourself  0 -  Trouble concentrating 0 -  Moving slowly or fidgety/restless 0 -  Suicidal thoughts 0 -  PHQ-9 Score 2 -  Difficult doing work/chores Not difficult at all -    Quality of Life:   Personal Goals: Goals established at orientation with interventions provided to work toward goal.  Personal Goals and Risk Factors at Admission - 11/05/19 1153      Core Components/Risk Factors/Patient Goals on Admission   Improve shortness of breath with ADL's Yes    Intervention Provide education, individualized exercise plan and daily activity instruction to help decrease symptoms of SOB with activities of daily living.    Expected Outcomes Short Term: Improve cardiorespiratory fitness to achieve a reduction of symptoms when performing ADLs;Long Term: Be able to perform more ADLs without symptoms or delay the onset of symptoms            Personal Goals Discharge:  Goals and Risk Factor Review    Row  Name 11/05/19 1129 11/05/19 1153           Core Components/Risk Factors/Patient Goals Review   Personal Goals Review Improve shortness of breath with ADL's;Develop more efficient breathing techniques such as purse lipped breathing and diaphragmatic breathing and practicing self-pacing with activity.;Increase knowledge of respiratory medications and ability to use respiratory devices properly. Increase knowledge of respiratory medications and ability to use respiratory devices properly.;Improve shortness of breath with ADL's;Develop more efficient breathing techniques such as purse lipped breathing and diaphragmatic breathing and practicing self-pacing with activity.             Exercise Goals and Review:  Exercise Goals    Row Name 11/05/19 1209             Exercise Goals    Increase Physical Activity Yes       Intervention Provide advice, education, support and counseling about physical activity/exercise needs.;Develop an individualized exercise prescription for aerobic and resistive training based on initial evaluation findings, risk stratification, comorbidities and participant's personal goals.       Expected Outcomes Short Term: Attend rehab on a regular basis to increase amount of physical activity.;Long Term: Add in home exercise to make exercise part of routine and to increase amount of physical activity.;Long Term: Exercising regularly at least 3-5 days a week.       Increase Strength and Stamina Yes       Intervention Provide advice, education, support and counseling about physical activity/exercise needs.;Develop an individualized exercise prescription for aerobic and resistive training based on initial evaluation findings, risk stratification, comorbidities and participant's personal goals.       Expected Outcomes Short Term: Increase workloads from initial exercise prescription for resistance, speed, and METs.;Short Term: Perform resistance training exercises routinely during rehab and add in resistance training at home;Long Term: Improve cardiorespiratory fitness, muscular endurance and strength as measured by increased METs and functional capacity (6MWT)       Able to understand and use rate of perceived exertion (RPE) scale Yes       Intervention Provide education and explanation on how to use RPE scale       Expected Outcomes Short Term: Able to use RPE daily in rehab to express subjective intensity level;Long Term:  Able to use RPE to guide intensity level when exercising independently       Able to understand and use Dyspnea scale Yes       Intervention Provide education and explanation on how to use Dyspnea scale       Expected Outcomes Short Term: Able to use Dyspnea scale daily in rehab to express subjective sense of shortness of breath during  exertion;Long Term: Able to use Dyspnea scale to guide intensity level when exercising independently       Knowledge and understanding of Target Heart Rate Range (THRR) Yes       Intervention Provide education and explanation of THRR including how the numbers were predicted and where they are located for reference       Expected Outcomes Short Term: Able to state/look up THRR;Short Term: Able to use daily as guideline for intensity in rehab;Long Term: Able to use THRR to govern intensity when exercising independently       Understanding of Exercise Prescription Yes       Intervention Provide education, explanation, and written materials on patient's individual exercise prescription       Expected Outcomes Short Term: Able to explain program exercise prescription;Long Term: Able to explain  home exercise prescription to exercise independently              Exercise Goals Re-Evaluation:   Nutrition & Weight - Outcomes:  Pre Biometrics - 11/05/19 1059      Pre Biometrics   Height 5' (1.524 m)    Weight 60.4 kg    BMI (Calculated) 26.01    Grip Strength 22.5 kg            Nutrition:   Nutrition Discharge:   Education Questionnaire Score:  Knowledge Questionnaire Score - 11/05/19 1128      Knowledge Questionnaire Score   Pre Score 15/18           Goals reviewed with patient; copy given to patient.

## 2019-12-09 ENCOUNTER — Encounter (HOSPITAL_COMMUNITY): Payer: Medicare Other

## 2019-12-11 ENCOUNTER — Encounter (HOSPITAL_COMMUNITY): Payer: Medicare Other

## 2019-12-16 ENCOUNTER — Encounter (HOSPITAL_COMMUNITY): Payer: Medicare Other

## 2019-12-18 ENCOUNTER — Encounter (HOSPITAL_COMMUNITY): Payer: Medicare Other

## 2019-12-23 ENCOUNTER — Encounter (HOSPITAL_COMMUNITY): Payer: Medicare Other

## 2019-12-25 ENCOUNTER — Encounter (HOSPITAL_COMMUNITY): Payer: Medicare Other

## 2019-12-30 ENCOUNTER — Encounter (HOSPITAL_COMMUNITY): Payer: Medicare Other

## 2020-01-01 ENCOUNTER — Encounter (HOSPITAL_COMMUNITY): Payer: Medicare Other

## 2020-01-06 ENCOUNTER — Encounter (HOSPITAL_COMMUNITY): Payer: Medicare Other

## 2020-01-08 ENCOUNTER — Encounter (HOSPITAL_COMMUNITY): Payer: Medicare Other

## 2020-01-13 ENCOUNTER — Encounter (HOSPITAL_COMMUNITY): Payer: Medicare Other

## 2020-01-26 ENCOUNTER — Telehealth: Payer: Self-pay | Admitting: Emergency Medicine

## 2020-01-26 NOTE — Telephone Encounter (Signed)
LMTCB x 1 

## 2020-01-27 NOTE — Telephone Encounter (Signed)
Spoke with pt. She has developed thrush again. States that this time it's all over her mouth instead of just on her tongue. Pt would like a refill on Nystatin.  RB - please advise. Thanks.

## 2020-01-27 NOTE — Telephone Encounter (Signed)
Attempted to call pt but unable to reach. Left message for her to return call. 

## 2020-01-27 NOTE — Telephone Encounter (Signed)
I'm not fully confident that this is thrush as opposed to just oral mucosal irritation from the inhalers. That said, I am ok with trying the Nystatin refill. I would also like for her to stop her Symbicort, just keep albuterol available to use prn.

## 2020-01-28 MED ORDER — NYSTATIN 100000 UNIT/ML MT SUSP: 5.0000 mL | Freq: Three times a day (TID) | OROMUCOSAL | 0 refills | Status: DC

## 2020-01-28 NOTE — Telephone Encounter (Signed)
Spoke with pt. She is aware of RB's response. Rx has been sent in. Nothing further was needed.

## 2020-02-19 ENCOUNTER — Ambulatory Visit: Payer: Medicare Other | Admitting: Internal Medicine

## 2020-02-19 ENCOUNTER — Telehealth: Payer: Self-pay | Admitting: Emergency Medicine

## 2020-02-19 MED ORDER — ALBUTEROL SULFATE HFA 108 (90 BASE) MCG/ACT IN AERS: 2.0000 | INHALATION_SPRAY | Freq: Four times a day (QID) | RESPIRATORY_TRACT | 5 refills | Status: DC | PRN

## 2020-02-19 NOTE — Telephone Encounter (Signed)
Called and spoke with Patient.  Patient stated she is needing a refill for albuterol rescue inhaler, sent to [facility]. Albuterol refill sent to requested pharmacy.  Nothing further at this time.

## 2020-03-12 ENCOUNTER — Ambulatory Visit: Payer: Medicare Other | Admitting: Family Medicine

## 2020-04-30 ENCOUNTER — Ambulatory Visit: Payer: Medicare Other | Admitting: Family Medicine

## 2020-05-14 ENCOUNTER — Ambulatory Visit: Payer: Medicare Other | Admitting: Pulmonary Disease

## 2020-05-14 ENCOUNTER — Encounter: Payer: Self-pay | Admitting: Pulmonary Disease

## 2020-05-14 ENCOUNTER — Other Ambulatory Visit: Payer: Self-pay

## 2020-05-14 VITALS — BP 122/64 | HR 71 | Temp 98.5°F | Ht 60.0 in | Wt 131.6 lb

## 2020-05-14 DIAGNOSIS — J45909 Unspecified asthma, uncomplicated: Secondary | ICD-10-CM

## 2020-05-14 DIAGNOSIS — B370 Candidal stomatitis: Secondary | ICD-10-CM | POA: Diagnosis not present

## 2020-05-14 MED ORDER — STIOLTO RESPIMAT 2.5-2.5 MCG/ACT IN AERS: 2.0000 | INHALATION_SPRAY | Freq: Every day | RESPIRATORY_TRACT | 0 refills | Status: DC

## 2020-05-14 NOTE — Assessment & Plan Note (Signed)
Doubt this is true thrush Believe patient probably has chronic tongue irritation or upper airway irritation Has previously been evaluated by ENT Dr. Redmond Baseman, patient was on nystatin at that time  Plan: As discussed with patient today if symptoms do resume would recommend that she follows up with ENT Dr. Redmond Baseman with active symptoms that we can further evaluate

## 2020-05-14 NOTE — Progress Notes (Signed)
@Patient  ID: [name], female    DOB: 1936/03/24, 84 y.o.   MRN: [id number]  Chief Complaint  Patient presents with  . Follow-up    Breathing seems worse with colder weather. She is using her albuterol inhaler and neb both about 2 x per wk.    Referring provider: Leighton Ruff, MD  HPI:  84 year old female former smoker followed in our office for asthma  PMH: GERD, hypertension, hyperlipidemia Smoker/ Smoking History: Former smoker.  Quit 1961.  1 pack year smoking history Maintenance:   Pt of: [name]  05/14/2020  - Visit   84 year old female former smoker found our office for asthma.  She is established with [name].  Patient was last seen in our office in May/2021.  At that time it was recommended that she remain on Symbicort.  It was strongly encouraged that she rinse and gargle after use to avoid thrush.  Patient is prone to throat and chronic tongue irritation.  She is been established with nystatin.  She has been treated multiple times with nystatin in the past.  Patient has been established with ENT Dr. Redmond Baseman.  Initial consult was in July/2021.  Impression & Plans:  [name] is a 84 y.o. female with oral thrush.  - Today, her mouth and throat look normal but she has been treating with nystatin. [name] is a known potential side effect of inhaled steroids. I emphasized good rinsing of the mouth and throat after dosing her Symbicort. Otherwise, repeat courses of nystatin remains the best treatment course. I recommended she swish and spit as opposed to swallowing. She can follow-up as needed.  Barbara Quitter, MD Otolaryngology  Patient presenting to office today as a follow-up.  She reports that she completely stop Symbicort last Thursday -05/06/2020.  She reports that she had to taper stopping the ICS/LABA inhaler and she was successful last week.  When she previously tried to stop cold [place] she reports she had an asthma exacerbation.  Patient has  been utilizing her albuterol nebulized medication 2 times daily as well as using her albuterol rescue inhaler 2-3 times daily.  We will discuss this today.  Questionaires / Pulmonary Flowsheets:   ACT:  Asthma Control Test ACT Total Score  05/14/2020 9   MMRC: No flowsheet data found.  Epworth:  No flowsheet data found.  Tests:   09/23/2019-chest x-ray-COPD, no active disease  10/18/2016-pulmonary function test-FVC 1.91 (134% predicted), postbronchodilator ratio 54, postbronchodilator FEV1 1.13 (104% predicted), positive bronchodilator response in FEV1 and mid flow reversibility, TLC 5.22 (123% predicted), DLCO 15.3 (90% predicted)  FENO:  Lab Results  Component Value Date   NITRICOXIDE 11 07/17/2018    PFT: PFT Results Latest Ref Rng & Units 10/18/2017  FVC-Pre L 1.91  FVC-Predicted Pre % 134  FVC-Post L 2.08  FVC-Predicted Post % 146  Pre FEV1/FVC % % 50  Post FEV1/FCV % % 54  FEV1-Pre L 0.96  FEV1-Predicted Pre % 89  FEV1-Post L 1.13  DLCO uncorrected ml/min/mmHg 15.30  DLCO UNC% % 90  DLVA Predicted % 95  TLC L 5.22  TLC % Predicted % 123  RV % Predicted % 157    WALK:  SIX MIN WALK 11/05/2019  2 Minute Oxygen Saturation % 100  2 Minute HR 80  4 Minute Oxygen Saturation % 97  4 Minute HR 79  6 Minute Oxygen Saturation % 91  6 Minute HR 80    Imaging: No results found.  Lab Results:  CBC  Component Value Date/Time   WBC 6.4 09/23/2019 2000   RBC 3.92 09/23/2019 2000   HGB 12.0 09/23/2019 2000   HCT 36.3 09/23/2019 2000   PLT 293 09/23/2019 2000   MCV 92.6 09/23/2019 2000   MCH 30.6 09/23/2019 2000   MCHC 33.1 09/23/2019 2000   RDW 13.7 09/23/2019 2000   LYMPHSABS 0.8 09/23/2019 2000   MONOABS 0.3 09/23/2019 2000   EOSABS 0.1 09/23/2019 2000   BASOSABS 0.0 09/23/2019 2000    BMET    Component Value Date/Time   NA 141 09/23/2019 2114   K 3.7 09/23/2019 2114   CL 107 09/23/2019 2114   CO2 23 09/23/2019 2114   GLUCOSE 117 (H) 09/23/2019  2114   BUN 20 09/23/2019 2114   CREATININE 0.98 09/23/2019 2114   CALCIUM 9.5 09/23/2019 2114   GFRNONAA 54 (L) 09/23/2019 2114   GFRAA >60 09/23/2019 2114    BNP No results found for: BNP  ProBNP No results found for: PROBNP  Specialty Problems      Pulmonary Problems   Allergic rhinitis    Qualifier: Diagnosis of  By: [name], [name]        Intrinsic asthma    Arlyce Harman 08/2013:  FEV1 1.08 (78%), ratio 51      Pleurisy      Allergies  Allergen Reactions  . Cepacol Shortness Of Breath    Difficulty breathing  . Contrast Media [Iodinated Diagnostic Agents] Shortness Of Breath  . Iodine Shortness Of Breath  . Lactose Intolerance (Gi) Shortness Of Breath    All dairy  Causes shortness of breath  . Mushroom Extract Complex Shortness Of Breath  . Other Hives, Shortness Of Breath and Swelling    Mushrooms (All mushrooms)  . Penicillins Shortness Of Breath and Swelling    DID THE REACTION INVOLVE: Swelling of the face/tongue/throat, SOB, or low BP? Yes  Sudden or severe rash/hives, skin peeling, or the inside of the mouth or nose? No Did it require medical treatment? No When did it last happen?Over 10 years ago If all above answers are "NO", may proceed with cephalosporin use.   . Sulfamethoxazole Anaphylaxis  . Symbicort [Budesonide-Formoterol Fumarate] Other (See Comments)    Dry eyes resulted    Immunization History  Administered Date(s) Administered  . Influenza Inj Mdck Quad Pf 06/16/2018  . Influenza-Unspecified 04/19/2020  . PFIZER SARS-COV-2 Vaccination 08/27/2019, 10/07/2019   Got covid19 booster about 2 weeks ago   Past Medical History:  Diagnosis Date  . Arthritis   . Asthma   . Blood clotting disorder ([place])   . Cataract   . COPD (chronic obstructive pulmonary disease) ([place])   . Depression   . Diverticulosis 10-2011   Colonoscopy  . GERD (gastroesophageal reflux disease)   . Hemorrhoids, internal   . Hx of colonic polyp   . Hyperlipemia    . Hypertension   . IBS (irritable bowel syndrome)   . 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   EGD  . TIA (transient ischemic attack)     Tobacco History: Social History   Tobacco Use  Smoking Status Former Smoker  . Packs/day: 0.50  . Years: 2.00  . Pack years: 1.00  . Types: Cigarettes  . Quit date: 09/28/1959  . Years since quitting: 60.6  Smokeless Tobacco Never Used   Counseling given: Not Answered   Continue to not smoke  Outpatient Encounter Medications as of 05/14/2020  Medication Sig  . albuterol (PROVENTIL) (2.5 MG/3ML) 0.083% nebulizer solution Take 3 mLs (  2.5 mg total) by nebulization 4 (four) times daily.  Marland Kitchen albuterol (VENTOLIN HFA) 108 (90 Base) MCG/ACT inhaler Inhale 2 puffs into the lungs every 6 (six) hours as needed for wheezing or shortness of breath.  . Alpha-D-Galactosidase (BEANO PO) Take 1 capsule by mouth as needed (for gas and bloating).   . clopidogrel (PLAVIX) 75 MG tablet Take 75 mg by mouth daily.  . cycloSPORINE (RESTASIS) 0.05 % ophthalmic emulsion Place 1 drop into both eyes 2 (two) times daily.   . Echinacea 125 MG CAPS Take 125 mg by mouth daily.  Marland Kitchen EPINEPHrine 0.3 mg/0.3 mL IJ SOAJ injection Inject 0.3 mg into the muscle once as needed (for a severe allergic reaction/use as directed).   . hydrochlorothiazide (MICROZIDE) 12.5 MG capsule Take 12.5 mg by mouth daily.   Marland Kitchen losartan (COZAAR) 50 MG tablet Take 50 mg by mouth daily.  . Multiple Vitamin (MULTIVITAMIN) tablet Take 1 tablet by mouth daily.   Marland Kitchen omeprazole (PRILOSEC) 40 MG capsule Take 1 capsule (40 mg total) by mouth daily. (Patient taking differently: Take 40 mg by mouth daily as needed. )  . Probiotic Product (PROBIOTIC PO) Take 1 capsule by mouth daily.   Marland Kitchen Propylene Glycol (SYSTANE BALANCE) 0.6 % SOLN Place 1 drop into both eyes 4 (four) times daily as needed (dry eyes).  Marland Kitchen Respiratory Therapy Supplies (FLUTTER) DEVI Use device 2-3 times a day to break up congestion  . Spacer/Aero-Holding  Chambers (AEROCHAMBER MV) inhaler Use as instructed  . Tiotropium Bromide-Olodaterol (STIOLTO RESPIMAT) 2.5-2.5 MCG/ACT AERS Inhale 2 puffs into the lungs daily.  . [DISCONTINUED] arformoterol (BROVANA) 15 MCG/2ML NEBU Take 2 mLs (15 mcg total) by nebulization 2 (two) times daily. DX: J45.909  . [DISCONTINUED] budesonide-formoterol (SYMBICORT) 160-4.5 MCG/ACT inhaler Inhale 2 puffs into the lungs 2 (two) times daily.  . [DISCONTINUED] nystatin (MYCOSTATIN) 100000 UNIT/ML suspension Take 5 mLs (500,000 Units total) by mouth 3 (three) times daily.  . [DISCONTINUED] predniSONE (DELTASONE) 5 MG tablet Take 1 tablet (5 mg total) by mouth daily with breakfast. (Patient not taking: Reported on 11/06/2019)   No facility-administered encounter medications on file as of 05/14/2020.     Review of Systems  Review of Systems  Constitutional: Negative for activity change, fatigue and fever.  HENT: Positive for sore throat. Negative for sinus pressure and sinus pain.   Respiratory: Negative for cough, shortness of breath and wheezing.   Cardiovascular: Negative for chest pain and palpitations.  Gastrointestinal: Negative for diarrhea, nausea and vomiting.  Musculoskeletal: Negative for arthralgias.  Neurological: Negative for dizziness.  Psychiatric/Behavioral: Negative for sleep disturbance. The patient is not nervous/anxious.      Physical Exam  BP 122/64 (BP Location: Left Arm, Cuff Size: Normal)   Pulse 71   Temp 98.5 F (36.9 C) (Temporal)   Ht 5' (1.524 m)   Wt 131 lb 9.6 oz (59.7 kg)   SpO2 99% Comment: on RA  BMI 25.70 kg/m   Wt Readings from Last 5 Encounters:  05/14/20 131 lb 9.6 oz (59.7 kg)  11/18/19 134 lb 4.2 oz (60.9 kg)  11/06/19 132 lb 12.8 oz (60.2 kg)  11/05/19 133 lb 2.5 oz (60.4 kg)  09/16/19 134 lb 3.2 oz (60.9 kg)    BMI Readings from Last 5 Encounters:  05/14/20 25.70 kg/m  11/18/19 26.22 kg/m  11/06/19 25.94 kg/m  11/05/19 26.01 kg/m  09/16/19 26.21 kg/m      Physical Exam Vitals and nursing note reviewed.  Constitutional:  General: She is not in acute distress.    Appearance: Normal appearance. She is normal weight.  HENT:     Head: Normocephalic and atraumatic.     Right Ear: External ear normal.     Left Ear: External ear normal.     Nose: Rhinorrhea present. No congestion.     Mouth/Throat:     Mouth: Mucous membranes are dry.     Pharynx: Oropharynx is clear.  Eyes:     Pupils: Pupils are equal, round, and reactive to light.  Cardiovascular:     Rate and Rhythm: Normal rate and regular rhythm.     Pulses: Normal pulses.     Heart sounds: Normal heart sounds. No murmur heard.   Pulmonary:     Effort: Pulmonary effort is normal. No respiratory distress.     Breath sounds: Normal breath sounds. No decreased air movement. No decreased breath sounds, wheezing or rales.  Musculoskeletal:     Cervical back: Normal range of motion.     Right lower leg: No edema.     Left lower leg: No edema.  Skin:    General: Skin is warm and dry.     Capillary Refill: Capillary refill takes less than 2 seconds.  Neurological:     General: No focal deficit present.     Mental Status: She is alert and oriented to person, place, and time. Mental status is at baseline.     Gait: Gait normal.  Psychiatric:        Mood and Affect: Mood normal.        Behavior: Behavior normal.        Thought Content: Thought content normal.        Judgment: Judgment normal.       Assessment & Plan:   Intrinsic asthma Previously managed on Symbicort Chronic obstructive asthma based off of pulmonary function testing in May/2019 Intolerant of ICS Reviewed May/2019 pulmonary function testing Discussed pathophysiology of asthma  Plan: Lab work today Remain off Symbicort Trial of Stiolto Respimat, samples provided today Patient to contact her office if she is tolerating sample If she is unable to tolerate Respimat device may need to consider  scheduled DuoNeb nebulized medications Follow-up with [name] in 2 to 3 months   Thrush Doubt this is true thrush Believe patient probably has chronic tongue irritation or upper airway irritation Has previously been evaluated by ENT Dr. Redmond Baseman, patient was on nystatin at that time  Plan: As discussed with patient today if symptoms do resume would recommend that she follows up with ENT Dr. Redmond Baseman with active symptoms that we can further evaluate    Return in about 3 months (around 08/14/2020), or if symptoms worsen or fail to improve, for Follow up with [name].   Lauraine Rinne, NP 05/14/2020   This appointment required 32 minutes of patient care (this includes precharting, chart review, review of results, face-to-face care, etc.).

## 2020-05-14 NOTE — Patient Instructions (Addendum)
You were seen today by Lauraine Rinne, NP  for:   1. Intrinsic asthma  - Alpha-1 antitrypsin phenotype; Future  Trial of Stiolto Respimat inhaler >>>2 puffs daily >>>Take this no matter what >>>This is not a rescue inhaler  Let us know how you are doing on this inhaler.  If you are tolerating it please contact our office so that way we can send in a prescription  Only use your albuterol as a rescue medication to be used if you can't catch your breath by resting or doing a relaxed purse lip breathing pattern.  - The less you use it, the better it will work when you need it. - Ok to use up to 2 puffs  every 4 hours if you must but call for immediate appointment if use goes up over your usual need - Don't leave home without it !!  (think of it like the spare tire for your car)    If you continue to have upper airway irritation would likely refer you back to Dr. Redmond Baseman with the ENT   We recommend today:  Orders Placed This Encounter  Procedures  . Alpha-1 antitrypsin phenotype    Standing Status:   Future    Standing Expiration Date:   05/14/2021   Orders Placed This Encounter  Procedures  . Alpha-1 antitrypsin phenotype   Meds ordered this encounter  Medications  . Tiotropium Bromide-Olodaterol (STIOLTO RESPIMAT) 2.5-2.5 MCG/ACT AERS    Sig: Inhale 2 puffs into the lungs daily.    Dispense:  4 g    Refill:  0    Order Specific Question:   Lot Number?    Answer:   [id number]    Order Specific Question:   Expiration Date?    Answer:   01/17/2022    Order Specific Question:   Quantity    Answer:   1    Follow Up:    Return in about 3 months (around 08/14/2020), or if symptoms worsen or fail to improve, for Follow up with Dr. Lamonte Sakai.   Notification of test results are managed in the following manner: If there are  any recommendations or changes to the  plan of care discussed in office today,  we will contact you and let you know what they are. If you do not hear from [place], then your  results are normal and you can view them through your  MyChart account , or a letter will be sent to you. Thank you again for trusting [place] with your care  - Thank you, [facility]    It is flu season:   >>> Best ways to protect herself from the flu: Receive the yearly flu vaccine, practice good hand hygiene washing with soap and also using hand sanitizer when available, eat a nutritious meals, get adequate rest, hydrate appropriately       Please contact the office if your symptoms worsen or you have concerns that you are not improving.   Thank you for choosing [facility] for your healthcare, and for allowing [place] to partner with you on your healthcare journey. I am thankful to be able to provide care to you today.   Wyn Quaker FNP-C

## 2020-05-14 NOTE — Assessment & Plan Note (Signed)
Previously managed on Symbicort Chronic obstructive asthma based off of pulmonary function testing in May/2019 Intolerant of ICS Reviewed May/2019 pulmonary function testing Discussed pathophysiology of asthma  Plan: Lab work today Remain off Symbicort Trial of Stiolto Respimat, samples provided today Patient to contact her office if she is tolerating sample If she is unable to tolerate Respimat device may need to consider scheduled DuoNeb nebulized medications Follow-up with Dr. Lamonte Sakai in 2 to 3 months

## 2020-05-21 ENCOUNTER — Telehealth: Payer: Self-pay | Admitting: Pulmonary Disease

## 2020-05-21 NOTE — Telephone Encounter (Signed)
Patient is aware of results and voiced her understanding.   Patient also stated since starting stiolto, she has developed productive cough with clear mucus and scratchy throat. Sx started 2 days after starting stiolto. She is rinsing her mouth after each use. Denied F/C/S.   Aaron Edelman, please advise. Thanks

## 2020-05-21 NOTE — Telephone Encounter (Signed)
05/21/2020  Received patient's alpha-1 antitrypsin testing.  It is normal.  This is reassuring.  No new recommendations.  Wyn Quaker, FNP

## 2020-05-21 NOTE — Telephone Encounter (Signed)
No new recommendations.  Continue Stiolto Respimat.  Continue to rinse mouth out after use.  Can schedule follow-up with ear nose and throat for upper airway irritation as patient is now symptomatic.  As previously discussed with patient if she develops symptoms again she would need to seek evaluation at ENT.  Wyn Quaker, FNP

## 2020-05-21 NOTE — Telephone Encounter (Signed)
Patient is aware of recommendations and voiced her understanding.  She will contact ENT to discuss sx.  Nothing further needed.

## 2020-05-25 ENCOUNTER — Telehealth: Payer: Self-pay | Admitting: Pulmonary Disease

## 2020-05-25 MED ORDER — STIOLTO RESPIMAT 2.5-2.5 MCG/ACT IN AERS: 2.0000 | INHALATION_SPRAY | Freq: Every day | RESPIRATORY_TRACT | 11 refills | Status: DC

## 2020-05-25 NOTE — Telephone Encounter (Signed)
Pt requesting stiolto refill.  This has been sent as requested.  Nothing further needed at this time- will close encounter.

## 2020-06-08 LAB — ALPHA-1 ANTITRYPSIN PHENOTYPE: A-1 Antitrypsin, Ser: 146 mg/dL (ref 83–199)

## 2020-06-09 ENCOUNTER — Encounter: Payer: Self-pay | Admitting: *Deleted

## 2020-06-15 ENCOUNTER — Telehealth: Payer: Self-pay | Admitting: Pulmonary Disease

## 2020-06-15 NOTE — Telephone Encounter (Signed)
Primary Pulmonologist: [place] Last office visit and with whom: 05/14/2020 with BPM What do we see them for (pulmonary problems): Intrinsic Asthma Last OV assessment/plan: Intrinsic asthma Previously managed on Symbicort Chronic obstructive asthma based off of pulmonary function testing in May/2019 Intolerant of ICS Reviewed May/2019 pulmonary function testing Discussed pathophysiology of asthma  Plan: Lab work today Remain off Symbicort Trial of Stiolto Respimat, samples provided today Patient to contact her office if she is tolerating sample If she is unable to tolerate Respimat device may need to consider scheduled DuoNeb nebulized medications Follow-up with Dr. Delton Coombes in 2 to 3 months  Was appointment offered to patient (explain)?  no   Reason for call: pt stated that when she started on stiolto on 11/26 this helped her for several days after and she feels this does help her breathing, but she is having elevated BP's now and a cough that produces clear sputum and headache after using the stiolto.  She has not stopped this med.  She wanted to see if she should change to another medication.  BP's are normally for her about 120/70 and after being on the stiolto her BP's are running around 150/78.     Allergies  Allergen Reactions  . Cepacol Shortness Of Breath    Difficulty breathing  . Contrast Media [Iodinated Diagnostic Agents] Shortness Of Breath  . Iodine Shortness Of Breath  . Lactose Intolerance (Gi) Shortness Of Breath    All dairy  Causes shortness of breath  . Mushroom Extract Complex Shortness Of Breath  . Other Hives, Shortness Of Breath and Swelling    Mushrooms (All mushrooms)  . Penicillins Shortness Of Breath and Swelling    DID THE REACTION INVOLVE: Swelling of the face/tongue/throat, SOB, or low BP? Yes  Sudden or severe rash/hives, skin peeling, or the inside of the mouth or nose? No Did it require medical treatment? No When did it last happen?Over 10 years  ago If all above answers are "NO", may proceed with cephalosporin use.   . Sulfamethoxazole Anaphylaxis  . Symbicort [Budesonide-Formoterol Fumarate] Other (See Comments)    Dry eyes resulted    Immunization History  Administered Date(s) Administered  . Influenza Inj Mdck Quad Pf 06/16/2018  . Influenza-Unspecified 04/19/2020  . PFIZER SARS-COV-2 Vaccination 08/27/2019, 10/07/2019

## 2020-06-15 NOTE — Telephone Encounter (Signed)
Called and spoke with pt letting her know the info stated by Arlys John that she could stop the [employer]. Pt verbalized understanding. Nothing further needed.

## 2020-06-15 NOTE — Telephone Encounter (Signed)
Okay to stop Stiolto.  Patient should have upcoming appointment with Dr. Delton Coombes.  Can be reevaluated at that point in time.  At that upcoming office visit can consider switching to different inhaler if clinically necessary.  Barbara Headland FNP

## 2020-07-19 ENCOUNTER — Other Ambulatory Visit: Payer: Self-pay | Admitting: Family Medicine

## 2020-07-19 DIAGNOSIS — R110 Nausea: Secondary | ICD-10-CM

## 2020-07-20 ENCOUNTER — Other Ambulatory Visit: Payer: Medicare Other

## 2020-07-21 ENCOUNTER — Other Ambulatory Visit: Payer: Self-pay | Admitting: Family Medicine

## 2020-07-21 DIAGNOSIS — R110 Nausea: Secondary | ICD-10-CM

## 2020-07-21 DIAGNOSIS — K219 Gastro-esophageal reflux disease without esophagitis: Secondary | ICD-10-CM

## 2020-08-04 ENCOUNTER — Encounter: Payer: Self-pay | Admitting: Emergency Medicine

## 2020-08-04 ENCOUNTER — Ambulatory Visit: Payer: Medicare Other | Admitting: Emergency Medicine

## 2020-08-04 ENCOUNTER — Other Ambulatory Visit: Payer: Self-pay

## 2020-08-04 DIAGNOSIS — K219 Gastro-esophageal reflux disease without esophagitis: Secondary | ICD-10-CM

## 2020-08-04 DIAGNOSIS — J45909 Unspecified asthma, uncomplicated: Secondary | ICD-10-CM

## 2020-08-04 MED ORDER — BUDESONIDE 0.25 MG/2ML IN SUSP: 0.2500 mg | Freq: Two times a day (BID) | RESPIRATORY_TRACT | 11 refills | Status: DC

## 2020-08-04 MED ORDER — ARFORMOTEROL TARTRATE 15 MCG/2ML IN NEBU: 15.0000 ug | INHALATION_SOLUTION | Freq: Two times a day (BID) | RESPIRATORY_TRACT | 11 refills | Status: DC

## 2020-08-04 MED ORDER — PANTOPRAZOLE SODIUM 40 MG PO TBEC: 40.0000 mg | DELAYED_RELEASE_TABLET | Freq: Two times a day (BID) | ORAL | 1 refills | Status: DC

## 2020-08-04 NOTE — Assessment & Plan Note (Signed)
She did not tolerate the Stiolto due to cough.  She had trouble with ICS in the past due to thrush.  I had tried her once on Brovana/budesonide but I do not think the budesonide was available.  She cannot remember if she ever took it.  I will retry this combination and see if she tolerates.  She needs to rinse and gargle after using.  If she develops thrush we could stop the budesonide and continue the Brovana alone.

## 2020-08-04 NOTE — Assessment & Plan Note (Signed)
She has daily breakthrough reflux.  Need to treat as aggressively as possible to get her asthma under control.  I will temporarily change her omeprazole to pantoprazole twice daily, may be able to scale back soon to pantoprazole daily.  She may need GI reevaluation.

## 2020-08-04 NOTE — Patient Instructions (Signed)
We will try restarting Brovana and budesonide 0.25 mg nebulizer treatments.  You take these twice a day on a schedule every day.  Rinse and gargle after you use them. You can keep your albuterol nebulizer and inhaler available to use either 1 nebulizer treatment or 2 puffs up to every 4 hours if needed for shortness of breath, chest tightness, wheezing. Stop your omeprazole. Start pantoprazole 40 mg twice a day for now.  We may be able to decrease this at your next visit depending on how your reflux and asthma are doing. Follow with APP in 3 to 4 weeks to assess status on the new regimen Follow with Dr. Lamonte Sakai in 6 months or sooner if needed

## 2020-08-04 NOTE — Progress Notes (Signed)
   Subjective:    Patient ID: [name], female    DOB: 09-21-1935, 85 y.o.   MRN: [id number]  Asthma Her past medical history is significant for asthma.   ROV 11/06/19 --Ms. Maser is 57, has a history of COPD/asthma with a positive bronchodilator response, intermittent allergic rhinitis and GERD.  We have been managing her on ICS/LABA but she is had significant difficulty with throat, tongue irritation, possible thrush.  She was changed to [facility] in late March to see if this would help and be easier to tolerate.  She had acute exacerbation of COPD 09/23/2019 and was treated with steroids.  Most recently she was tried on Brovana, was unable to get budesonide so she took low dose pred for a couple weeks, now off.  She has had to go back to generic symbicort. She does rinse and gargle after. She uses albuterol about 0-1x a day.   ROV 08/04/20 --follow-up visit for 85 year old woman with history of COPD/asthma (positive bronchodilator response), allergic rhinitis, GERD.  I have been managing her on Symbicort but she had been having difficulty avoiding thrush.  She was tried on [employer] in November 2021 she may benefit at some but she is currently off of it because it has been contributing to cough.  She started using albuterol neb qd. She has albuterol which she is using several times a day. Cold air is a trigger.  When she stopped the Stiolto her cough did improve some. She was also dealing with esophageal irritation at the same time. She is using omeprazole bid since she stopped the Stiolto, still has breakthrough reflux on this.    Review of Systems Per HPI     Objective:   Physical Exam Vitals:   08/04/20 1204  BP: 118/72  Pulse: 68  Temp: (!) 97.3 F (36.3 C)  SpO2: 99%  Weight: 135 lb 3.2 oz (61.3 kg)  Height: 4\' 9"  (1.448 m)   Gen: Pleasant, elderly woman, in no distress,  normal affect  ENT: No lesions,  mouth clear, no thrush  Neck: No JVD, no stridor  Lungs: No  use of accessory muscles, distant, no wheeze or crackles  Cardiovascular: RRR, heart sounds normal, no murmur or gallops, no peripheral edema  Musculoskeletal: No deformities, no cyanosis or clubbing  Neuro: alert, non focal  Skin: Warm, no lesions or rashes      Assessment & Plan:  Intrinsic asthma She did not tolerate the Stiolto due to cough.  She had trouble with ICS in the past due to thrush.  I had tried her once on Brovana/budesonide but I do not think the budesonide was available.  She cannot remember if she ever took it.  I will retry this combination and see if she tolerates.  She needs to rinse and gargle after using.  If she develops thrush we could stop the budesonide and continue the Brovana alone.  GERD She has daily breakthrough reflux.  Need to treat as aggressively as possible to get her asthma under control.  I will temporarily change her omeprazole to pantoprazole twice daily, may be able to scale back soon to pantoprazole daily.  She may need GI reevaluation.   Baltazar Apo, MD, PhD 08/04/2020, 12:37 PM [facility] [phone] or if no answer [phone]

## 2020-08-09 ENCOUNTER — Ambulatory Visit
Admission: RE | Admit: 2020-08-09 | Discharge: 2020-08-09 | Disposition: A | Discharge status: Home / Self Care | Payer: Medicare Other | Source: Ambulatory Visit | Attending: Family Medicine | Admitting: Family Medicine

## 2020-08-09 ENCOUNTER — Other Ambulatory Visit: Payer: Self-pay

## 2020-08-09 DIAGNOSIS — K219 Gastro-esophageal reflux disease without esophagitis: Secondary | ICD-10-CM

## 2020-08-09 DIAGNOSIS — R110 Nausea: Secondary | ICD-10-CM

## 2020-08-09 MED ORDER — GADOBENATE DIMEGLUMINE 529 MG/ML IV SOLN
12.0000 mL | Freq: Once | INTRAVENOUS | Status: AC | PRN
  Administered 2020-08-09: 12 mL via INTRAVENOUS

## 2020-08-12 ENCOUNTER — Telehealth: Payer: Self-pay | Admitting: Emergency Medicine

## 2020-08-12 NOTE — Telephone Encounter (Signed)
Called and spoke with patient who states that she was able to pick up the Pantoprazole but not either of the nebulizer medications but she could not tell me why. Called and spoke with pharmacy as to why, was told Arformoterol Garlon Hatchet) needs PA done and Budesonide out of stock and that they are just waiting to get it in for patient. ATC patient to give her update LMTCB  PA initiated in CMM Key: [id number] Sent to plan will wait for update

## 2020-08-18 MED ORDER — BUDESONIDE 0.25 MG/2ML IN SUSP: 0.2500 mg | Freq: Two times a day (BID) | RESPIRATORY_TRACT | 11 refills | Status: DC

## 2020-08-18 MED ORDER — ARFORMOTEROL TARTRATE 15 MCG/2ML IN NEBU: 15.0000 ug | INHALATION_SOLUTION | Freq: Two times a day (BID) | RESPIRATORY_TRACT | 11 refills | Status: DC

## 2020-08-18 NOTE — Telephone Encounter (Signed)
Request Reference Number: [id number]. ARFORMOTEROL NEB 15/2ML is denied for not meeting the prior authorization requirement(s). Details of this decision are in the notice attached below or have been faxed to you.  PA denied. Call made to [facility] they report it may be because they are a local pharmacy. Recommended we sent medications to DME company. Script [facility].   Will send message to provider nurse as Juluis Rainier.   LM to update patient.

## 2020-08-19 ENCOUNTER — Telehealth: Payer: Self-pay | Admitting: Emergency Medicine

## 2020-08-19 NOTE — Telephone Encounter (Signed)
Will leave in triage and call in AM as it 5:15pm.

## 2020-08-19 NOTE — Telephone Encounter (Signed)
Called and spoke to pt. Pt states she received notification from the [place] of the order. Advised pt to contact our office if she has any questions or concerns. Pt verbalized understanding. Nothing further needed at this time.

## 2020-08-20 NOTE — Telephone Encounter (Signed)
[facility]  They advised that they are not in network with the pt's insurance  Pt can call the number on the back of her insurance card to find pharm that is  Pt aware  She will call and let us know what pharm she wants to use

## 2020-08-23 ENCOUNTER — Telehealth: Payer: Self-pay | Admitting: Emergency Medicine

## 2020-08-23 NOTE — Telephone Encounter (Signed)
disregard

## 2020-08-25 ENCOUNTER — Other Ambulatory Visit: Payer: Self-pay

## 2020-08-25 ENCOUNTER — Ambulatory Visit: Payer: Medicare Other | Admitting: Adult Health

## 2020-08-25 ENCOUNTER — Encounter: Payer: Self-pay | Admitting: Adult Health

## 2020-08-25 DIAGNOSIS — J45909 Unspecified asthma, uncomplicated: Secondary | ICD-10-CM | POA: Diagnosis not present

## 2020-08-25 DIAGNOSIS — J309 Allergic rhinitis, unspecified: Secondary | ICD-10-CM | POA: Diagnosis not present

## 2020-08-25 DIAGNOSIS — K219 Gastro-esophageal reflux disease without esophagitis: Secondary | ICD-10-CM

## 2020-08-25 MED ORDER — BUDESONIDE 0.25 MG/2ML IN SUSP: 0.2500 mg | Freq: Two times a day (BID) | RESPIRATORY_TRACT | 11 refills | Status: DC

## 2020-08-25 MED ORDER — ARFORMOTEROL TARTRATE 15 MCG/2ML IN NEBU: 15.0000 ug | INHALATION_SOLUTION | Freq: Two times a day (BID) | RESPIRATORY_TRACT | 11 refills | Status: DC

## 2020-08-25 NOTE — Progress Notes (Signed)
@Patient  ID: [name], female    DOB: 1936/03/09, 85 y.o.   MRN: [id number]  No chief complaint on file.   Referring provider: Leighton Ruff, MD  HPI: 85 year old female followed for COPD with asthma, allergic rhinitis   TEST/EVENTS :  Alpha 1 MM , 146  Pulmonary function testing May 2019 showed mild airflow obstruction with reversibility. FEV1 104%, ratio 54, FVC 146%, positive bronchodilator response  08/25/2020 Follow up : COPD w/ asthma , AR  Patient returns for a 3-week follow-up.  Patient was seen last visit was unable to tolerate Stiolto.  She was changed to Brovana and budesonide. She has been tried on Symbicort, Stiolto with multiple side effects Unfortunately these did not get sent to the correct pharmacy.  We are resending them today.  She was also placed on a PPI with Protonix but was unable to tolerate due to headaches.  And is now back on omeprazole. She continues to have some intermittent cough wheezing and shortness of breath.  She has some postnasal drainage.   Allergies  Allergen Reactions  . Cepacol Shortness Of Breath    Difficulty breathing  . Contrast Media [Iodinated Diagnostic Agents] Shortness Of Breath and Swelling    Pt reports "I could not breath at all and my face swelled up"   . Iodine Shortness Of Breath  . Lactose Intolerance (Gi) Shortness Of Breath    All dairy  Causes shortness of breath  . Mushroom Extract Complex Shortness Of Breath  . Other Hives, Shortness Of Breath and Swelling    Mushrooms (All mushrooms)  . Penicillins Shortness Of Breath and Swelling    DID THE REACTION INVOLVE: Swelling of the face/tongue/throat, SOB, or low BP? Yes  Sudden or severe rash/hives, skin peeling, or the inside of the mouth or nose? No Did it require medical treatment? No When did it last happen?Over 10 years ago If all above answers are "NO", may proceed with cephalosporin use.   . Sulfamethoxazole Anaphylaxis  . Symbicort  [Budesonide-Formoterol Fumarate] Other (See Comments)    Dry eyes resulted    Immunization History  Administered Date(s) Administered  . Influenza Inj Mdck Quad Pf 06/16/2018  . Influenza,inj,Quad PF,6+ Mos 04/01/2019  . Influenza,inj,Quad PF,6-35 Mos 06/03/2018  . Influenza-Unspecified 04/19/2020  . PFIZER(Purple Top)SARS-COV-2 Vaccination 09/23/2019, 10/14/2019, 05/04/2020  . PPD Test 11/04/2019  . Pneumococcal Conjugate-13 03/20/2016  . Pneumococcal Polysaccharide-23 12/23/2004  . Td 12/23/2004    Past Medical History:  Diagnosis Date  . Arthritis   . Asthma   . Blood clotting disorder ([place])   . Cataract   . COPD (chronic obstructive pulmonary disease) ([place])   . Depression   . Diverticulosis 10-2011   Colonoscopy  . GERD (gastroesophageal reflux disease)   . Hemorrhoids, internal   . Hx of colonic polyp   . Hyperlipemia   . Hypertension   . IBS (irritable bowel syndrome)   . 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   EGD  . TIA (transient ischemic attack)     Tobacco History: Social History   Tobacco Use  Smoking Status Former Smoker  . Packs/day: 0.50  . Years: 2.00  . Pack years: 1.00  . Types: Cigarettes  . Start date: 31  . Quit date: 09/28/1959  . Years since quitting: 60.9  Smokeless Tobacco Never Used   Counseling given: Not Answered   Outpatient Medications Prior to Visit  Medication Sig Dispense Refill  . albuterol (PROVENTIL) (2.5 MG/3ML) 0.083% nebulizer solution Take 3 mLs (2.5  mg total) by nebulization 4 (four) times daily. 150 mL 5  . albuterol (VENTOLIN HFA) 108 (90 Base) MCG/ACT inhaler Inhale 2 puffs into the lungs every 6 (six) hours as needed for wheezing or shortness of breath. 8 g 5  . Alpha-D-Galactosidase (BEANO PO) Take 1 capsule by mouth as needed (for gas and bloating).     . clopidogrel (PLAVIX) 75 MG tablet Take 75 mg by mouth daily.  1  . cycloSPORINE (RESTASIS) 0.05 % ophthalmic emulsion Place 1 drop into both eyes 2 (two) times  daily.    . Echinacea 125 MG CAPS Take 125 mg by mouth daily.    Marland Kitchen EPINEPHrine 0.3 mg/0.3 mL IJ SOAJ injection Inject 0.3 mg into the muscle once as needed (for a severe allergic reaction/use as directed).     . hydrochlorothiazide (MICROZIDE) 12.5 MG capsule Take 12.5 mg by mouth daily.     Marland Kitchen losartan (COZAAR) 50 MG tablet Take 50 mg by mouth daily.    . Multiple Vitamin (MULTIVITAMIN) tablet Take 1 tablet by mouth daily.     . Probiotic Product (PROBIOTIC PO) Take 1 capsule by mouth daily.     Marland Kitchen Propylene Glycol (SYSTANE BALANCE) 0.6 % SOLN Place 1 drop into both eyes 4 (four) times daily as needed (dry eyes).    Marland Kitchen Respiratory Therapy Supplies (FLUTTER) DEVI Use device 2-3 times a day to break up congestion 1 each 0  . Spacer/Aero-Holding Chambers (AEROCHAMBER MV) inhaler Use as instructed 1 each 0  . pantoprazole (PROTONIX) 40 MG tablet Take 1 tablet (40 mg total) by mouth 2 (two) times daily before a meal. (Patient not taking: Reported on 08/25/2020) 60 tablet 1  . arformoterol (BROVANA) 15 MCG/2ML NEBU Take 2 mLs (15 mcg total) by nebulization 2 (two) times daily. (Patient not taking: Reported on 08/25/2020) 120 mL 11  . budesonide (PULMICORT) 0.25 MG/2ML nebulizer solution Take 2 mLs (0.25 mg total) by nebulization in the morning and at bedtime. (Patient not taking: Reported on 08/25/2020) 120 mL 11   No facility-administered medications prior to visit.     Review of Systems:   Constitutional:   No  weight loss, night sweats,  Fevers, chills,  +fatigue, or  lassitude.  HEENT:   No headaches,  Difficulty swallowing,  Tooth/dental problems, or  Sore throat,                No sneezing, itching, ear ache,  +nasal congestion, post nasal drip,   CV:  No chest pain,  Orthopnea, PND, swelling in lower extremities, anasarca, dizziness, palpitations, syncope.   GI  No heartburn, indigestion, abdominal pain, nausea, vomiting, diarrhea, change in bowel habits, loss of appetite, bloody stools.    Resp:   No chest wall deformity  Skin: no rash or lesions.  GU: no dysuria, change in color of urine, no urgency or frequency.  No flank pain, no hematuria   MS:  No joint pain or swelling.  No decreased range of motion.  No back pain.    Physical Exam  BP 118/64 (BP Location: Left Arm, Cuff Size: Normal)   Pulse 79   Temp (!) 97.2 F (36.2 C) (Temporal)   Ht 4\' 9"  (1.448 m)   Wt 136 lb 9.6 oz (62 kg)   SpO2 100% Comment: RA  BMI 29.56 kg/m   GEN: A/Ox3; pleasant , NAD, well nourished    HEENT:  [place]/AT,    NOSE-clear, THROAT-clear, no lesions, no postnasal drip or exudate noted.  NECK:  Supple w/ fair ROM; no JVD; normal carotid impulses w/o bruits; no thyromegaly or nodules palpated; no lymphadenopathy.    RESP  Clear  P & A; w/o, wheezes/ rales/ or rhonchi. no accessory muscle use, no dullness to percussion  CARD:  RRR, no m/r/g, no peripheral edema, pulses intact, no cyanosis or clubbing.  GI:   Soft & nt; nml bowel sounds; no organomegaly or masses detected.   Musco: Warm bil, no deformities or joint swelling noted.   Neuro: alert, no focal deficits noted.    Skin: Warm, no lesions or rashes    Lab Results:  CBC  No results found for: BNP  ProBNP No results found for: PROBNP  Imaging:    PFT Results Latest Ref Rng & Units 10/18/2017  FVC-Pre L 1.91  FVC-Predicted Pre % 134  FVC-Post L 2.08  FVC-Predicted Post % 146  Pre FEV1/FVC % % 50  Post FEV1/FCV % % 54  FEV1-Pre L 0.96  FEV1-Predicted Pre % 89  FEV1-Post L 1.13  DLCO uncorrected ml/min/mmHg 15.30  DLCO UNC% % 90  DLVA Predicted % 95  TLC L 5.22  TLC % Predicted % 123  RV % Predicted % 157    Lab Results  Component Value Date   NITRICOXIDE 11 07/17/2018        Assessment & Plan:   No problem-specific Assessment & Plan notes found for this encounter.     Rexene Edison, NP 08/25/2020

## 2020-08-25 NOTE — Patient Instructions (Addendum)
Begin Brovana and budesonide nebulizer twice daily.  We will send these out to the correct pharmacy immediately. Albuterol inhaler or nebulizer as needed Flutter valve as needed for cough and congestion Continue on Protonix twice daily GERD diet Claritin 10mg  daily As needed  Drainage .  Follow-up with Dr. Lamonte Sakai in 3 months and as needed Please contact office for sooner follow up if symptoms do not improve or worsen or seek emergency care

## 2020-08-27 NOTE — Assessment & Plan Note (Signed)
Difficulty tolerating medications.  Will change to Brovana and budesonide.  New order sent to correct pharmacy Increase mucociliary clearance add in flutter valve Plan  Patient Instructions  Begin Brovana and budesonide nebulizer twice daily.  We will send these out to the correct pharmacy immediately. Albuterol inhaler or nebulizer as needed Flutter valve as needed for cough and congestion Continue on Protonix twice daily GERD diet Claritin 10mg  daily As needed  Drainage .  Follow-up with Dr. Lamonte Sakai in 3 months and as needed Please contact office for sooner follow up if symptoms do not improve or worsen or seek emergency care

## 2020-08-27 NOTE — Assessment & Plan Note (Signed)
Continue on GERD diet.  PPI.

## 2020-08-27 NOTE — Assessment & Plan Note (Signed)
Continue on maintenance regimen 

## 2020-09-02 ENCOUNTER — Ambulatory Visit: Payer: Medicare Other | Admitting: Gastroenterology

## 2020-09-17 ENCOUNTER — Telehealth: Payer: Self-pay | Admitting: Emergency Medicine

## 2020-09-17 NOTE — Telephone Encounter (Signed)
ATC LVMTCB x 1  

## 2020-09-20 NOTE — Telephone Encounter (Signed)
Attempted to call pt but unable to reach. Left message for her to return call. 

## 2020-09-20 NOTE — Telephone Encounter (Signed)
Patient is returning phone call. Patient phone number is [phone].

## 2020-09-21 ENCOUNTER — Other Ambulatory Visit: Payer: Self-pay | Admitting: Emergency Medicine

## 2020-09-21 MED ORDER — BUDESONIDE 0.25 MG/2ML IN SUSP: 0.2500 mg | Freq: Two times a day (BID) | RESPIRATORY_TRACT | 11 refills | Status: DC

## 2020-09-21 MED ORDER — ARFORMOTEROL TARTRATE 15 MCG/2ML IN NEBU: 15.0000 ug | INHALATION_SOLUTION | Freq: Two times a day (BID) | RESPIRATORY_TRACT | 11 refills | Status: DC

## 2020-09-21 NOTE — Telephone Encounter (Signed)
Medication was sent to [facility] medication sent to [facility] and she can't get the medication   Called Melissa with [facility] and patient isn't in their system, so she is going to look into in on their side.   Patient asking if she can go back to the inhaler she was on before changing to the nebulizer medication. Mentioned Pulmicort inhaler but I see she was previously on symbicort 80 or 160.   Sending to Tammy Parrett to determine next steps for patient to have medication since she hasn't received the nebulizer medication sent in March

## 2020-09-21 NOTE — Telephone Encounter (Signed)
Called [facility] to make sure the medication was filed under part B, the pharmacy tech told me the patient needs to verify some information related to her red white and blue medicare card.   I called the patient, unable to reach, left message to call [facility] to verify this information if the medication is still not affordable to call us so we can call in an inhaler until we can get the nebulizer medication for patient.

## 2020-09-22 NOTE — Telephone Encounter (Signed)
Called pharmacy :  [name] is not covered. She does not have MCR B coverage per pharmacy which  Is not typical , advised her to contact her insurance company to verify .   She wants to try Symbicort again, it is ready at the pharmacy $47 . She will stop budesonide .   Follow up with in office as planned   Please contact office for sooner follow up if symptoms do not improve or worsen or seek emergency care

## 2020-09-27 ENCOUNTER — Ambulatory Visit: Payer: Medicare Other | Admitting: Family Medicine

## 2020-10-06 ENCOUNTER — Encounter: Payer: Self-pay | Admitting: Primary Care

## 2020-10-06 ENCOUNTER — Other Ambulatory Visit: Payer: Self-pay

## 2020-10-06 ENCOUNTER — Ambulatory Visit (INDEPENDENT_AMBULATORY_CARE_PROVIDER_SITE_OTHER): Payer: Medicare Other | Admitting: Primary Care

## 2020-10-06 ENCOUNTER — Other Ambulatory Visit: Payer: Self-pay | Admitting: Primary Care

## 2020-10-06 DIAGNOSIS — J45909 Unspecified asthma, uncomplicated: Secondary | ICD-10-CM

## 2020-10-06 MED ORDER — ARFORMOTEROL TARTRATE 15 MCG/2ML IN NEBU: 15.0000 ug | INHALATION_SOLUTION | Freq: Two times a day (BID) | RESPIRATORY_TRACT | 11 refills | Status: DC

## 2020-10-06 MED ORDER — BUDESONIDE-FORMOTEROL FUMARATE 80-4.5 MCG/ACT IN AERO: 2.0000 | INHALATION_SPRAY | Freq: Two times a day (BID) | RESPIRATORY_TRACT | 2 refills | Status: DC

## 2020-10-06 MED ORDER — BUDESONIDE 0.25 MG/2ML IN SUSP: 0.2500 mg | Freq: Two times a day (BID) | RESPIRATORY_TRACT | 11 refills | Status: DC

## 2020-10-06 NOTE — Progress Notes (Deleted)
@Patient  ID: [name], female    DOB: 07-31-1935, 85 y.o.   MRN: [id number]  No chief complaint on file.   Referring provider: Leighton Ruff, MD  HPI: 85 year old female, former light smoker quit 1961.  Past medical history significant for moderate asthma.  Previous [facility] pulmonary encounter: 08/25/2020 Follow up : COPD w/ asthma , [place]  Patient returns for a 3-week follow-up.  Patient was seen last visit was unable to tolerate Stiolto.  She was changed to Brovana and budesonide. She has been tried on Symbicort, Stiolto with multiple side effects Unfortunately these did not get sent to the correct pharmacy.  We are resending them today.  She was also placed on a PPI with Protonix but was unable to tolerate due to headaches.  And is now back on omeprazole. She continues to have some intermittent cough wheezing and shortness of breath.  She has some postnasal drainage.  10/06/2020-  Patient presents today for follow-up.  During last visit she was started on Brovana and budesonide nebulizers twice daily. Brovana not covered. She wanted to try Symbicort again, prescription sent to pharamcy. Stopped budesonide.   Allergies  Allergen Reactions  . Cepacol Shortness Of Breath    Difficulty breathing  . Contrast Media [Iodinated Diagnostic Agents] Shortness Of Breath and Swelling    Pt reports "I could not breath at all and my face swelled up"   . Iodine Shortness Of Breath  . Lactose Intolerance (Gi) Shortness Of Breath    All dairy  Causes shortness of breath  . Mushroom Extract Complex Shortness Of Breath  . Other Hives, Shortness Of Breath and Swelling    Mushrooms (All mushrooms)  . Penicillins Shortness Of Breath and Swelling    DID THE REACTION INVOLVE: Swelling of the face/tongue/throat, SOB, or low BP? Yes  Sudden or severe rash/hives, skin peeling, or the inside of the mouth or nose? No Did it require medical treatment? No When did it last happen?Over 10 years ago If  all above answers are "NO", may proceed with cephalosporin use.   . Sulfamethoxazole Anaphylaxis  . Symbicort [Budesonide-Formoterol Fumarate] Other (See Comments)    Dry eyes resulted    Immunization History  Administered Date(s) Administered  . Influenza Inj Mdck Quad Pf 06/16/2018  . Influenza,inj,Quad PF,6+ Mos 04/01/2019  . Influenza,inj,Quad PF,6-35 Mos 06/03/2018  . Influenza-Unspecified 04/19/2020  . PFIZER(Purple Top)SARS-COV-2 Vaccination 09/23/2019, 10/14/2019, 05/04/2020  . PPD Test 11/04/2019  . Pneumococcal Conjugate-13 03/20/2016  . Pneumococcal Polysaccharide-23 12/23/2004  . Td 12/23/2004    Past Medical History:  Diagnosis Date  . Arthritis   . Asthma   . Blood clotting disorder ([place])   . Cataract   . COPD (chronic obstructive pulmonary disease) ([place])   . Depression   . Diverticulosis 10-2011   Colonoscopy  . GERD (gastroesophageal reflux disease)   . Hemorrhoids, internal   . Hx of colonic polyp   . Hyperlipemia   . Hypertension   . IBS (irritable bowel syndrome)   . 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   EGD  . TIA (transient ischemic attack)     Tobacco History: Social History   Tobacco Use  Smoking Status Former Smoker  . Packs/day: 0.50  . Years: 2.00  . Pack years: 1.00  . Types: Cigarettes  . Start date: 41  . Quit date: 09/28/1959  . Years since quitting: 61.0  Smokeless Tobacco Never Used   Counseling given: Not Answered   Outpatient Medications Prior to Visit  Medication  Sig Dispense Refill  . albuterol (PROVENTIL) (2.5 MG/3ML) 0.083% nebulizer solution Take 3 mLs (2.5 mg total) by nebulization 4 (four) times daily. 150 mL 5  . albuterol (VENTOLIN HFA) 108 (90 Base) MCG/ACT inhaler INHALE TWO PUFFS into THE lungs EVERY 6 HOURS AS NEEDED FOR SHORTNESS OF BREATH OR wheezing 8.5 g 5  . Alpha-D-Galactosidase (BEANO PO) Take 1 capsule by mouth as needed (for gas and bloating).     . clopidogrel (PLAVIX) 75 MG tablet Take 75 mg by mouth  daily.  1  . cycloSPORINE (RESTASIS) 0.05 % ophthalmic emulsion Place 1 drop into both eyes 2 (two) times daily.    . Echinacea 125 MG CAPS Take 125 mg by mouth daily.    Marland Kitchen EPINEPHrine 0.3 mg/0.3 mL IJ SOAJ injection Inject 0.3 mg into the muscle once as needed (for a severe allergic reaction/use as directed).     . hydrochlorothiazide (MICROZIDE) 12.5 MG capsule Take 12.5 mg by mouth daily.     Marland Kitchen losartan (COZAAR) 50 MG tablet Take 50 mg by mouth daily.    . Multiple Vitamin (MULTIVITAMIN) tablet Take 1 tablet by mouth daily.     . pantoprazole (PROTONIX) 40 MG tablet Take 1 tablet (40 mg total) by mouth 2 (two) times daily before a meal. (Patient not taking: Reported on 08/25/2020) 60 tablet 1  . Probiotic Product (PROBIOTIC PO) Take 1 capsule by mouth daily.     Marland Kitchen Propylene Glycol (SYSTANE BALANCE) 0.6 % SOLN Place 1 drop into both eyes 4 (four) times daily as needed (dry eyes).    Marland Kitchen Respiratory Therapy Supplies (FLUTTER) DEVI Use device 2-3 times a day to break up congestion 1 each 0  . Spacer/Aero-Holding Chambers (AEROCHAMBER MV) inhaler Use as instructed 1 each 0   No facility-administered medications prior to visit.      Review of Systems  Review of Systems   Physical Exam  There were no vitals taken for this visit. Physical Exam   Lab Results:  CBC    Component Value Date/Time   WBC 6.4 09/23/2019 2000   RBC 3.92 09/23/2019 2000   HGB 12.0 09/23/2019 2000   HCT 36.3 09/23/2019 2000   PLT 293 09/23/2019 2000   MCV 92.6 09/23/2019 2000   MCH 30.6 09/23/2019 2000   MCHC 33.1 09/23/2019 2000   RDW 13.7 09/23/2019 2000   LYMPHSABS 0.8 09/23/2019 2000   MONOABS 0.3 09/23/2019 2000   EOSABS 0.1 09/23/2019 2000   BASOSABS 0.0 09/23/2019 2000    BMET    Component Value Date/Time   NA 141 09/23/2019 2114   K 3.7 09/23/2019 2114   CL 107 09/23/2019 2114   CO2 23 09/23/2019 2114   GLUCOSE 117 (H) 09/23/2019 2114   BUN 20 09/23/2019 2114   CREATININE 0.98 09/23/2019  2114   CALCIUM 9.5 09/23/2019 2114   GFRNONAA 54 (L) 09/23/2019 2114   GFRAA >60 09/23/2019 2114    BNP No results found for: BNP  ProBNP No results found for: PROBNP  Imaging: No results found.   Assessment & Plan:   No problem-specific Assessment & Plan notes found for this encounter.     Martyn Ehrich, NP 10/06/2020

## 2020-10-06 NOTE — Patient Instructions (Signed)
Sending in RX [place] and Budesonide to [facility]   While awaiting approval for nebulizer medication recommend starting low dose Symbicort 80mg  take 1-2 puffs twice daily (rinse mouth after use)  Follow-up May 4th televisit at 4:30 (we will call you)

## 2020-10-06 NOTE — Progress Notes (Signed)
Virtual Visit via Telephone Note  I connected with [name] on 10/06/20 at  4:30 PM EDT by telephone and verified that I am speaking with the correct person using two identifiers.  Location: Patient: Home Provider: Office   I discussed the limitations, risks, security and privacy concerns of performing an evaluation and management service by telephone and the availability of in person appointments. I also discussed with the patient that there may be a patient responsible charge related to this service. The patient expressed understanding and agreed to proceed.   History of Present Illness: 85 year old female, former light smoker quit 1961.  Past medical history significant for moderate asthma, HTN, TIA, allergic rhinitis, GERD, HLD, hx cholecystectomy. Patient of Dr. Lamonte Sakai, last seen by pulmonary NP on 08/25/20.  Previous [facility] pulmonary encounter: 08/25/2020 Follow up : COPD w/ asthma , [place]  Patient returns for a 3-week follow-up.  Patient was seen last visit was unable to tolerate Stiolto.  She was changed to Brovana and budesonide. She has been tried on Symbicort, Stiolto with multiple side effects Unfortunately these did not get sent to the correct pharmacy.  We are resending them today.  She was also placed on a PPI with Protonix but was unable to tolerate due to headaches.  And is now back on omeprazole. She continues to have some intermittent cough wheezing and shortness of breath.  She has some postnasal drainage.  10/06/2020- Interim hx  Patient contacted today for televisit to review medications. Unable to tolerate Sitolto. Dr. Lamonte Sakai switched her to nebulized ICS/LABA. She has been waiting on prescription Brovana and budesonide but could never get it. Since pollen is so bad outside she has been struggling all the time with her breathing. She has been using albuterol which is not lasting her very long. She resume Symbicort 160 but reports that it is too strong and is requesting lower  dose. She would still like to see if she can get nebulizer medication approved. We will [facility] in prescription [facility].    Observations/Objective:  - Able to speak in full sentences  Assessment and Plan:  Intrinsic asthma: - Patient did not tolerate Stiolto d/t cough. She has had trouble with ICS in the past due to thrush.  - Sending in [place] and Budesonide to [facility].  - While awaiting approval for nebulizer medication, recommend patient start on low dose Symbicort 80mg  1-2 puffs twice daily (rinse mouth after use)  Follow Up Instructions:  FU teleivist in 2 weeks to ensure medication regimen in place    I discussed the assessment and treatment plan with the patient. The patient was provided an opportunity to ask questions and all were answered. The patient agreed with the plan and demonstrated an understanding of the instructions.   The patient was advised to call back or seek an in-person evaluation if the symptoms worsen or if the condition fails to improve as anticipated.  I provided 22 minutes of non-face-to-face time during this encounter.   Martyn Ehrich, NP

## 2020-10-06 NOTE — Telephone Encounter (Signed)
Allergy notification to Symbicort is showing when ordering these medications. Reaction is dry eyes.   [name], okay to proceed with order?

## 2020-10-06 NOTE — Telephone Encounter (Signed)
Margie can you send in prescription for brovana 28mcg/2ML and budesonide 0.25mg /67ml twice daily to [facility]

## 2020-10-06 NOTE — Telephone Encounter (Signed)
Yes

## 2020-10-07 MED ORDER — BUDESONIDE 0.25 MG/2ML IN SUSP: 0.2500 mg | Freq: Two times a day (BID) | RESPIRATORY_TRACT | 11 refills | Status: DC

## 2020-10-07 MED ORDER — ARFORMOTEROL TARTRATE 15 MCG/2ML IN NEBU: 15.0000 ug | INHALATION_SOLUTION | Freq: Two times a day (BID) | RESPIRATORY_TRACT | 11 refills | Status: DC

## 2020-10-07 NOTE — Telephone Encounter (Signed)
Rx for Pulmicort and Garlon Hatchet has been sent to [facility].  Lm to make patient aware.

## 2020-10-07 NOTE — Addendum Note (Signed)
Addended by: Claudette Head A on: 10/07/2020 08:11 AM   Modules accepted: Orders

## 2020-10-08 NOTE — Telephone Encounter (Signed)
Patient is aware of below message and voiced her understanding.  Nothing further needed at this time.   

## 2020-10-11 ENCOUNTER — Telehealth: Payer: Self-pay | Admitting: Primary Care

## 2020-10-11 MED ORDER — FORMOTEROL FUMARATE 20 MCG/2ML IN NEBU: 20.0000 ug | INHALATION_SOLUTION | Freq: Two times a day (BID) | RESPIRATORY_TRACT | 1 refills | Status: DC

## 2020-10-11 NOTE — Telephone Encounter (Signed)
Spoke with Rodman Key from [place] today. He stated that he spoke with Threasa Beards from [facility] in regards to the patient's Brovana prescription. Her insurance will not pay for the [facility] but will pay for Perforomist if the RX is sent to [facility]. He wanted to pass the message along to [facility]. I advised him that I would send a message over to [facility].   Beth, can you please advise if you are ok switching her to Perforomist? Thanks!

## 2020-10-11 NOTE — Telephone Encounter (Signed)
Yes that is fine

## 2020-10-11 NOTE — Telephone Encounter (Signed)
rx has been changed to the perforomist due to the insurance will cover this.  Nothing further is needed.

## 2020-10-20 ENCOUNTER — Other Ambulatory Visit: Payer: Self-pay

## 2020-10-20 ENCOUNTER — Encounter: Payer: Self-pay | Admitting: Primary Care

## 2020-10-20 ENCOUNTER — Ambulatory Visit (INDEPENDENT_AMBULATORY_CARE_PROVIDER_SITE_OTHER): Payer: Medicare Other | Admitting: Primary Care

## 2020-10-20 DIAGNOSIS — J45909 Unspecified asthma, uncomplicated: Secondary | ICD-10-CM | POA: Diagnosis not present

## 2020-10-20 NOTE — Telephone Encounter (Signed)
I called [facility] and spoke with the pharmacy.  They stated that the perforomist was too costly for the pt.  The insurance did pay a percentage of this medication but the co-pay for the pt was over $100 and with her other co-pays she could not afford this.  Pt was shipped the budesonide x 1 week ago.  Will forward back to [facility] to make her aware.

## 2020-10-20 NOTE — Patient Instructions (Signed)
  Asthma: - Continue Symbicort 80 one-two puffs twice a day (rinse mouth after use) - Once you receive both Budesonide/Performist nebulizer's notify office and then start using both nebulizer's morning and evening   Follow-up: - 3 months with Dr. Lamonte Sakai

## 2020-10-20 NOTE — Telephone Encounter (Signed)
Can we reach out to [facility] to see if they have received prescription for perforomist and when patient should expect to receive medication

## 2020-10-20 NOTE — Telephone Encounter (Signed)
Can we try to apply for patient assistance? She needs to be able to be on either Brovana or Performist; otherwise she will need to stay on Symbicort 80 if nebulized LABA not affordable

## 2020-10-20 NOTE — Telephone Encounter (Addendum)
Secure email has been sent to [facility] with [facility].

## 2020-10-20 NOTE — Progress Notes (Signed)
Virtual Visit via Telephone Note  I connected with [name] on 10/20/20 at  4:30 PM EDT by telephone and verified that I am speaking with the correct person using two identifiers.  Location: Patient: Home Provider: Office    I discussed the limitations, risks, security and privacy concerns of performing an evaluation and management service by telephone and the availability of in person appointments. I also discussed with the patient that there may be a patient responsible charge related to this service. The patient expressed understanding and agreed to proceed.   History of Present Illness: 85 year old female, former light smoker quit 1961.  Past medical history significant for moderate asthma, HTN, TIA, allergic rhinitis, GERD, HLD, hx cholecystectomy. Patient of Dr. Lamonte Sakai.  Previous [facility] pulmonary encounter: 08/25/2020 Follow up : COPD w/ asthma , [place]  Patient returns for a 3-week follow-up.  Patient was seen last visit was unable to tolerate Stiolto.  She was changed to Brovana and budesonide. She has been tried on Symbicort, Stiolto with multiple side effects Unfortunately these did not get sent to the correct pharmacy.  We are resending them today.  She was also placed on a PPI with Protonix but was unable to tolerate due to headaches.  And is now back on omeprazole. She continues to have some intermittent cough wheezing and shortness of breath.  She has some postnasal drainage.  10/06/2020 Patient contacted today for televisit to review medications. Unable to tolerate Sitolto. Dr. Lamonte Sakai switched her to nebulized ICS/LABA. She has been waiting on prescription Brovana and budesonide but could never get it. Since pollen is so bad outside she has been struggling all the time with her breathing. She has been using albuterol which is not lasting her very long. She resume Symbicort 160 but reports that it is too strong and is requesting lower dose. She would still like to see if she can get  nebulizer medication approved. We will send in prescription to [facility].   10/20/2020- Interim hx  Patient contacted today for 2 week follow-up. During last visit we started her on low dose Symbicort until we could switch her to nebulized ICS/LABA through [facility]. She has tolerated Symbicort 80 ok. She stopped using Symbicort when she received Budesonide but has not received Perforomist (Brovana was not covered by insurance).    Observations/Objective:  - Able to speak in full sentences; frequent throat clearing  Assessment and Plan:  Intrinsic asthma: - She has had trouble with ICS in the past d/t thrush. Currently in the process of changing her over to nebulized ICS/LABA. She has received Budesonide nebulizer but is still awaiting Perforomist. Continue Symbicort 80 1-2 puffs twice a day until she receives both nebulizers    Follow Up Instructions:  - 3 months with Dr. Lamonte Sakai    I discussed the assessment and treatment plan with the patient. The patient was provided an opportunity to ask questions and all were answered. The patient agreed with the plan and demonstrated an understanding of the instructions.   The patient was advised to call back or seek an in-person evaluation if the symptoms worsen or if the condition fails to improve as anticipated.  I provided 18 minutes of non-face-to-face time during this encounter.   Martyn Ehrich, NP

## 2020-10-21 MED ORDER — BUDESONIDE 0.25 MG/2ML IN SUSP: 0.2500 mg | Freq: Two times a day (BID) | RESPIRATORY_TRACT | 11 refills | Status: DC

## 2020-10-21 MED ORDER — FORMOTEROL FUMARATE 20 MCG/2ML IN NEBU: 20.0000 ug | INHALATION_SOLUTION | Freq: Two times a day (BID) | RESPIRATORY_TRACT | 11 refills | Status: DC

## 2020-10-21 NOTE — Telephone Encounter (Signed)
Patient called back and I gave her the information about both nebulizer medications. She asked me to call [place] and see what the actual copay would be for her when it comes to the [place]. I called [facility] and spoke with [facility] Pharmacist who stated that for the Budesonide it would be $63 and for the Formoterol it would be $114. I called the patient back to let her know what I found out. She stated that the Budesonide is only $45 for her at [facility] and she would like me to send both prescriptions there to see if Formoteral is cheaper as well. Both RX's have been sent to [place]. Advised patient to let us know if there was any issues and if they were still expensive per [facility] she would have to stay on the Symbicort 80. Nothing further needed at this time.

## 2020-10-21 NOTE — Telephone Encounter (Signed)
Looked for patient assistance for Brovana and Perforomist and there is currently no patient assistance for Brovana and for Perforomist patient assistance it says that the patient must be uninsured and she has insurance. According to Beth's note she will have to stay on Symbicort 80. ATC patient to let her know this information, LMTCB

## 2020-10-21 NOTE — Addendum Note (Signed)
Addended by: Lia Foyer R on: 10/21/2020 11:45 AM   Modules accepted: Orders

## 2020-11-29 ENCOUNTER — Telehealth: Payer: Self-pay | Admitting: Primary Care

## 2020-11-29 NOTE — Telephone Encounter (Signed)
Called and spoke with patient, she states she was told to call the office when she received the nebulizer solution (budesonide and performist).  She received both of the solutions.  She wanted to know if she could mix them together and I advised her that you cannot mix any other nebulizer solution with performist.  She wanted to know how long to wait between nebs.  I advised she did not have to wait between them, but rinse the neb cup between the solutions.  She was hesitant to do one neb right after the other if they cannot be mixed.  I advised it was ok as long as they are not mixed in the nebulizer machine.  Advised if she felt better she could wait 5-10 minutes between them.  Advised she is to use them both twice daily.  She verbalized understanding.  Nothing further needed.

## 2020-12-13 ENCOUNTER — Telehealth: Payer: Self-pay | Admitting: Emergency Medicine

## 2020-12-13 NOTE — Telephone Encounter (Signed)
Called and spoke with patient, she states that she tested positive for covid on 12/06/20 at [facility].  She was given a zpack.  She no longer has the chills or body aches.  She still has a headache and eating makes her nauseated, she is forcing fluids.  She feels like she still has no strength.  She called the pharmacy and found out that she had one refill of her Symbicort and they have ordered it and will have it tomorrow.  She says the Symbicort helps with her breathing since she has had covid.  Advised to call the office if she is unable to eat or drink or her breathing becomes more difficult.  She verbalized understanding.  Nothing further needed.

## 2020-12-13 NOTE — Telephone Encounter (Signed)
ATC no voice mail. Will attempt to contact at later time. 

## 2021-02-14 ENCOUNTER — Other Ambulatory Visit: Payer: Self-pay

## 2021-02-14 ENCOUNTER — Ambulatory Visit: Payer: Medicare Other | Admitting: Primary Care

## 2021-02-14 ENCOUNTER — Encounter: Payer: Self-pay | Admitting: Primary Care

## 2021-02-14 DIAGNOSIS — J45909 Unspecified asthma, uncomplicated: Secondary | ICD-10-CM | POA: Diagnosis not present

## 2021-02-14 DIAGNOSIS — K219 Gastro-esophageal reflux disease without esophagitis: Secondary | ICD-10-CM | POA: Diagnosis not present

## 2021-02-14 MED ORDER — BUDESONIDE-FORMOTEROL FUMARATE 80-4.5 MCG/ACT IN AERO: 2.0000 | INHALATION_SPRAY | Freq: Two times a day (BID) | RESPIRATORY_TRACT | 11 refills | Status: DC

## 2021-02-14 NOTE — Patient Instructions (Addendum)
Recommendations: - Resume Symbicort - try taking 1 puff in the morning and 1 puff in the evening - Use albuterol rescue inhaler every 6 hours as needed for breakthrough shortness of breath/wheezing - Continue Omeprazole '40mg'$  daily  - Take over the counter probiotic  Follow-up - 6 months with Dr. Lamonte Sakai or Eustaquio Maize NP

## 2021-02-14 NOTE — Assessment & Plan Note (Signed)
-   Stable interval; She had covid-19 in June 2022 but symptoms have returned to baseline. She was previously intolerant to Symbicort HFA d/t thrush. We initially tried her on Budesonide and Perforomist nebulizer's but she felt these were too strong. She is now able to tolerate low dose Symbicort and prefers this. No further changes recommended. Rx sent for Symbicort 79mg 1-2 puffs twice daily. FU in 6 months with Dr. BLamonte Sakaior APP.

## 2021-02-14 NOTE — Assessment & Plan Note (Signed)
-   Well controlled on omeprazole '40mg'$  daily

## 2021-02-14 NOTE — Progress Notes (Signed)
$'@Patient'F$  ID: [name], female    DOB: 01-Sep-1935, 85 y.o.   MRN: [id number]  Chief Complaint  Patient presents with   Follow-up    Patient reports that she is doing well today.     Referring provider: Leighton Ruff, MD  HPI: 85 year old female, former light smoker quit 1961.  Past medical history significant for moderate asthma, HTN, TIA, allergic rhinitis, GERD, HLD, hx cholecystectomy. Patient of Dr. Lamonte Sakai.  Previous [facility] pulmonary encounter: 08/25/2020 Follow up : COPD w/ asthma , [place]  Patient returns for a 3-week follow-up.  Patient was seen last visit was unable to tolerate Stiolto.  She was changed to Brovana and budesonide. She has been tried on Symbicort, Stiolto with multiple side effects Unfortunately these did not get sent to the correct pharmacy.  We are resending them today.  She was also placed on a PPI with Protonix but was unable to tolerate due to headaches.  And is now back on omeprazole. She continues to have some intermittent cough wheezing and shortness of breath.  She has some postnasal drainage.  10/06/2020 Patient contacted today for televisit to review medications. Unable to tolerate Sitolto. Dr. Lamonte Sakai switched her to nebulized ICS/LABA. She has been waiting on prescription Brovana and budesonide but could never get it. Since pollen is so bad outside she has been struggling all the time with her breathing. She has been using albuterol which is not lasting her very long. She resume Symbicort 160 but reports that it is too strong and is requesting lower dose. She would still like to see if she can get nebulizer medication approved. We will send in prescription to [facility].   10/20/2020 Patient contacted today for 2 week follow-up. During last visit we started her on low dose Symbicort until we could switch her to nebulized ICS/LABA through [facility]. She has tolerated Symbicort 80 ok. She stopped using Symbicort when she received Budesonide but has not  received Perforomist (Brovana was not covered by insurance).   Intrinsic asthma: - She has had trouble with ICS in the past d/t thrush. Currently in the process of changing her over to nebulized ICS/LABA. She has received Budesonide nebulizer but is still awaiting Perforomist. Continue Symbicort 80 1-2 puffs twice a day until she receives both nebulizers    02/14/2021- Interim hx  Patient presents today for 3-4 month follow-up. Her maintenance regimen was changed at our last visit as she had trouble with ICS inhalers d/t thursh. Since then she has received both Budesonide and Perforomist nebulizer's.  She tested positive for covid in June 2022 and was treated with Zpack. She went back to using Symbicort 15mg during this time. She felt nebulizer was too strong. UTI was caused by the soap she was using. [name] exacerbated by GERD.  She is taking '40mg'$  omeprazole once a day. She is now rinsing her mouth at night and uses a throat spray. She no longer is getting thrush or yeast infection symptoms. She can now tolerate Symbicort, she has been taking 2 puffs in the morning. After covid she had residual cough and fatigue that has now resolved. States that she "feels good again". She very rarely requires her rescue inhaler, on average she uses SABA once a day. Strong smells will exacerbate her asthma symptoms.  Denies chest tightness or wheezing.    Allergies  Allergen Reactions   Cepacol Shortness Of Breath    Difficulty breathing   Contrast Media [Iodinated Diagnostic Agents] Shortness Of Breath  and Swelling    Pt reports "I could not breath at all and my face swelled up"    Iodine Shortness Of Breath   Lactose Intolerance (Gi) Shortness Of Breath    All dairy  Causes shortness of breath   Mushroom Extract Complex Shortness Of Breath   Other Hives, Shortness Of Breath and Swelling    Mushrooms (All mushrooms)   Penicillins Shortness Of Breath and Swelling    DID THE REACTION INVOLVE: Swelling of  the face/tongue/throat, SOB, or low BP? Yes  Sudden or severe rash/hives, skin peeling, or the inside of the mouth or nose? No Did it require medical treatment? No When did it last happen? Over 10 years ago   If all above answers are "NO", may proceed with cephalosporin use.    Sulfamethoxazole Anaphylaxis   Budesonide Other (See Comments)    Headache    Immunization History  Administered Date(s) Administered   Influenza Inj Mdck Quad Pf 06/16/2018   Influenza,inj,Quad PF,6+ Mos 04/01/2019   Influenza,inj,Quad PF,6-35 Mos 06/03/2018   Influenza-Unspecified 04/19/2020   PFIZER(Purple Top)SARS-COV-2 Vaccination 09/23/2019, 10/14/2019, 05/04/2020   PPD Test 11/04/2019   Pneumococcal Conjugate-13 03/20/2016   Pneumococcal Polysaccharide-23 12/23/2004   Td 12/23/2004    Past Medical History:  Diagnosis Date   Arthritis    Asthma    Blood clotting disorder ([place])    Cataract    COPD (chronic obstructive pulmonary disease) ([place])    Depression    Diverticulosis 10-2011   Colonoscopy   GERD (gastroesophageal reflux disease)    Hemorrhoids, internal    Hx of colonic polyp    Hyperlipemia    Hypertension    IBS (irritable bowel syndrome)    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   EGD   TIA (transient ischemic attack)     Tobacco History: Social History   Tobacco Use  Smoking Status Former   Packs/day: 0.50   Years: 2.00   Pack years: 1.00   Types: Cigarettes   Start date: 44   Quit date: 09/28/1959   Years since quitting: 61.4  Smokeless Tobacco Never   Counseling given: Not Answered   Outpatient Medications Prior to Visit  Medication Sig Dispense Refill   albuterol (PROVENTIL) (2.5 MG/3ML) 0.083% nebulizer solution Take 3 mLs (2.5 mg total) by nebulization 4 (four) times daily. 150 mL 5   albuterol (VENTOLIN HFA) 108 (90 Base) MCG/ACT inhaler INHALE TWO PUFFS into THE lungs EVERY 6 HOURS AS NEEDED FOR SHORTNESS OF BREATH OR wheezing 8.5 g 5   Alpha-D-Galactosidase  (BEANO PO) Take 1 capsule by mouth as needed (for gas and bloating).      clopidogrel (PLAVIX) 75 MG tablet Take 75 mg by mouth daily.  1   cycloSPORINE (RESTASIS) 0.05 % ophthalmic emulsion Place 1 drop into both eyes 2 (two) times daily.     Echinacea 125 MG CAPS Take 125 mg by mouth daily.     EPINEPHrine 0.3 mg/0.3 mL IJ SOAJ injection Inject 0.3 mg into the muscle once as needed (for a severe allergic reaction/use as directed).      hydrochlorothiazide (MICROZIDE) 12.5 MG capsule Take 12.5 mg by mouth daily.      losartan (COZAAR) 50 MG tablet Take 50 mg by mouth daily.     Multiple Vitamin (MULTIVITAMIN) tablet Take 1 tablet by mouth daily.      omeprazole (PRILOSEC) 40 MG capsule Take 40 mg by mouth daily.     Probiotic Product (PROBIOTIC PO) Take 1 capsule by  mouth daily.      Propylene Glycol (SYSTANE BALANCE) 0.6 % SOLN Place 1 drop into both eyes 4 (four) times daily as needed (dry eyes).     Respiratory Therapy Supplies (FLUTTER) DEVI Use device 2-3 times a day to break up congestion 1 each 0   Spacer/Aero-Holding Chambers (AEROCHAMBER MV) inhaler Use as instructed 1 each 0   budesonide-formoterol (SYMBICORT) 80-4.5 MCG/ACT inhaler Inhale 2 puffs into the lungs 2 (two) times daily. 1 each 2   pantoprazole (PROTONIX) 40 MG tablet Take 1 tablet (40 mg total) by mouth 2 (two) times daily before a meal. 60 tablet 1   sucralfate (CARAFATE) 1 GM/10ML suspension TAKE 10 ML BY MOUTH 4 TIMES DAILY     budesonide (PULMICORT) 0.25 MG/2ML nebulizer solution Take 2 mLs (0.25 mg total) by nebulization 2 (two) times daily. (Patient not taking: Reported on 02/14/2021) 120 mL 11   Difluprednate 0.05 % EMUL Place 1 drop into the left eye 4 (four) times daily.     formoterol (PERFOROMIST) 20 MCG/2ML nebulizer solution Take 2 mLs (20 mcg total) by nebulization 2 (two) times daily. (Patient not taking: No sig reported) 120 mL 11   nitrofurantoin, macrocrystal-monohydrate, (MACROBID) 100 MG capsule Take 100  mg by mouth every 12 (twelve) hours.     No facility-administered medications prior to visit.    Review of Systems  Review of Systems  Constitutional: Negative.   HENT: Negative.    Respiratory:  Negative for cough, chest tightness, shortness of breath and wheezing.   Cardiovascular: Negative.     Physical Exam  BP 116/74 (BP Location: Left Arm, Patient Position: Sitting, Cuff Size: Normal)   Pulse 80   Temp 98.2 F (36.8 C) (Oral)   Ht '4\' 9"'$  (1.448 m)   Wt 133 lb 12.8 oz (60.7 kg)   SpO2 98%   BMI 28.95 kg/m  Physical Exam Constitutional:      Appearance: Normal appearance.  HENT:     Head: Normocephalic and atraumatic.     Mouth/Throat:     Mouth: Mucous membranes are moist.     Pharynx: Oropharynx is clear.  Cardiovascular:     Rate and Rhythm: Normal rate and regular rhythm.  Pulmonary:     Effort: Pulmonary effort is normal.     Breath sounds: Normal breath sounds. No wheezing, rhonchi or rales.  Neurological:     General: No focal deficit present.     Mental Status: She is alert and oriented to person, place, and time. Mental status is at baseline.  Psychiatric:        Mood and Affect: Mood normal.        Behavior: Behavior normal.        Thought Content: Thought content normal.        Judgment: Judgment normal.     Lab Results:  CBC    Component Value Date/Time   WBC 6.4 09/23/2019 2000   RBC 3.92 09/23/2019 2000   HGB 12.0 09/23/2019 2000   HCT 36.3 09/23/2019 2000   PLT 293 09/23/2019 2000   MCV 92.6 09/23/2019 2000   MCH 30.6 09/23/2019 2000   MCHC 33.1 09/23/2019 2000   RDW 13.7 09/23/2019 2000   LYMPHSABS 0.8 09/23/2019 2000   MONOABS 0.3 09/23/2019 2000   EOSABS 0.1 09/23/2019 2000   BASOSABS 0.0 09/23/2019 2000    BMET    Component Value Date/Time   NA 141 09/23/2019 2114   K 3.7 09/23/2019 2114   CL  107 09/23/2019 2114   CO2 23 09/23/2019 2114   GLUCOSE 117 (H) 09/23/2019 2114   BUN 20 09/23/2019 2114   CREATININE 0.98  09/23/2019 2114   CALCIUM 9.5 09/23/2019 2114   GFRNONAA 54 (L) 09/23/2019 2114   GFRAA >60 09/23/2019 2114    BNP No results found for: BNP  ProBNP No results found for: PROBNP  Imaging: No results found.   Assessment & Plan:   Intrinsic asthma - Stable interval; She had covid-19 in June 2022 but symptoms have returned to baseline. She was previously intolerant to Symbicort HFA d/t thrush. We initially tried her on Budesonide and Perforomist nebulizer's but she felt these were too strong. She is now able to tolerate low dose Symbicort and prefers this. No further changes recommended. Rx sent for Symbicort 55mg 1-2 puffs twice daily. FU in 6 months with Dr. BLamonte Sakaior APP.   GERD - Well controlled on omeprazole '40mg'$  daily   EMartyn Ehrich NP 02/14/2021

## 2021-02-25 ENCOUNTER — Ambulatory Visit: Payer: Medicare Other | Admitting: Emergency Medicine

## 2021-03-17 ENCOUNTER — Ambulatory Visit (INDEPENDENT_AMBULATORY_CARE_PROVIDER_SITE_OTHER): Payer: Medicare Other

## 2021-03-17 ENCOUNTER — Ambulatory Visit (INDEPENDENT_AMBULATORY_CARE_PROVIDER_SITE_OTHER): Payer: PPO | Admitting: Podiatry

## 2021-03-17 ENCOUNTER — Other Ambulatory Visit: Payer: Self-pay

## 2021-03-17 DIAGNOSIS — M2141 Flat foot [pes planus] (acquired), right foot: Secondary | ICD-10-CM

## 2021-03-17 DIAGNOSIS — M2142 Flat foot [pes planus] (acquired), left foot: Secondary | ICD-10-CM

## 2021-03-17 DIAGNOSIS — M79671 Pain in right foot: Secondary | ICD-10-CM

## 2021-03-17 DIAGNOSIS — Z7901 Long term (current) use of anticoagulants: Secondary | ICD-10-CM

## 2021-03-17 DIAGNOSIS — M79674 Pain in right toe(s): Secondary | ICD-10-CM

## 2021-03-17 DIAGNOSIS — M79675 Pain in left toe(s): Secondary | ICD-10-CM

## 2021-03-17 DIAGNOSIS — B351 Tinea unguium: Secondary | ICD-10-CM

## 2021-03-17 MED ORDER — CICLOPIROX 8 % EX SOLN: Freq: Every day | CUTANEOUS | 0 refills | Status: DC

## 2021-03-17 NOTE — Patient Instructions (Signed)
Ciclopirox nail solution What is this medication? CICLOPIROX (sye kloe PEER ox) NAIL SOLUTION is an antifungal medicine. It used to treat fungal infections of the nails. This medicine may be used for other purposes; ask your health care provider or pharmacist if you have questions. COMMON BRAND NAME(S): Ciclodan, CNL8, Penlac What should I tell my care team before I take this medication? They need to know if you have any of these conditions: diabetes mellitus history of seizures HIV infection immune system problems or organ transplant large areas of burned or damaged skin peripheral vascular disease or poor circulation taking corticosteroid medication (including steroid inhalers, cream, or lotion) an unusual or allergic reaction to ciclopirox, isopropyl alcohol, other medicines, foods, dyes, or preservatives pregnant or trying to get pregnant breast-feeding How should I use this medication? This medicine is for external use only. Follow the directions that come with this medicine exactly. Wash and dry your hands before use. Avoid contact with the eyes, mouth or nose. If you do get this medicine in your eyes, rinse out with plenty of cool tap water. Contact your doctor or health care professional if eye irritation occurs. Use at regular intervals. Do not use your medicine more often than directed. Finish the full course prescribed by your doctor or health care professional even if you think you are better. Do not stop using except on your doctor's advice. Talk to your pediatrician regarding the use of this medicine in children. While this medicine may be prescribed for children as young as 12 years for selected conditions, precautions do apply. Overdosage: If you think you have taken too much of this medicine contact a poison control center or emergency room at once. NOTE: This medicine is only for you. Do not share this medicine with others. What if I miss a dose? If you miss a dose, use it as  soon as you can. If it is almost time for your next dose, use only that dose. Do not use double or extra doses. What may interact with this medication? Interactions are not expected. Do not use any other skin products without telling your doctor or health care professional. This list may not describe all possible interactions. Give your health care provider a list of all the medicines, herbs, non-prescription drugs, or dietary supplements you use. Also tell them if you smoke, drink alcohol, or use illegal drugs. Some items may interact with your medicine. What should I watch for while using this medication? Tell your doctor or health care professional if your symptoms get worse. Four to six months of treatment may be needed for the nail(s) to improve. Some people may not achieve a complete cure or clearing of the nails by this time. Tell your doctor or health care professional if you develop sores or blisters that do not heal properly. If your nail infection returns after stopping using this product, contact your doctor or health care professional. What side effects may I notice from receiving this medication? Side effects that you should report to your doctor or health care professional as soon as possible: allergic reactions like skin rash, itching or hives, swelling of the face, lips, or tongue severe irritation, redness, burning, blistering, peeling, swelling, oozing Side effects that usually do not require medical attention (report to your doctor or health care professional if they continue or are bothersome): mild reddening of the skin nail discoloration temporary burning or mild stinging at the site of application This list may not describe all possible side effects.  Call your doctor for medical advice about side effects. You may report side effects to FDA at 1-800-FDA-1088. Where should I keep my medication? Keep out of the reach of children. Store at room temperature between 15 and 30  degrees C (59 and 86 degrees F). Do not freeze. Protect from light by storing the bottle in the carton after every use. This medicine is flammable. Keep away from heat and flame. Throw away any unused medicine after the expiration date. NOTE: This sheet is a summary. It may not cover all possible information. If you have questions about this medicine, talk to your doctor, pharmacist, or health care provider.  2022 Elsevier/Gold Standard (2007-09-09 16:49:20)

## 2021-03-18 NOTE — Progress Notes (Signed)
Subjective: 85 year old female presents the office for concerns of toenail fungus nails are becoming thickened elongated she cannot trim them herself.  Also she has been having foot pain with walking.  She states that her feet have flattened and when I last saw her I gave her a coupon for [employer] where she had a pad put inside of her shoe which helped her feet and leg pain.  She is asking for another one.  No recent injury or trauma since I last saw her and she has no other concerns.  She is on Plavix.   Objective: AAO x3, NAD DP/PT pulses palpable bilaterally, CRT less than 3 seconds Nails are hypertrophic, dystrophic, brittle, discolored, elongated 10. No surrounding redness or drainage. Tenderness nails 1-5 bilaterally.  Mild hyperkeratotic tissue right submetatarsal 5.  There is no ulcerations. Decreased medial arch upon weightbearing.  Not able to elicit any area of pinpoint tenderness today.  Flexor, extensor tendons appear to be intact.  MMT 5/5.  Ankle, subtalar joint range of motion intact.  No pain on exam today but she states the majority of her pain is when she is ambulatory and is intermittent. No pain with calf compression, swelling, warmth, erythema  Assessment: 85 year old female with symptomatic onychomycosis; flatfoot  Plan: -All treatment options discussed with the patient including all alternatives, risks, complications.  -X-rays obtained reviewed.  No evidence of acute fracture or stress fracture. -We discussed shoes and good arch support.  I gave her a coupon for [employer] to get the same pad inside of her shoes as this was beneficial previously for her.  Discussed general stretching exercises to be helpful as well -Sharply debrided the nails x10 without any complications or bleeding.  Prescribed Penlac and discussed side effects. -Patient encouraged to call the office with any questions, concerns, change in symptoms.   Trula Slade DPM

## 2021-05-05 ENCOUNTER — Telehealth: Payer: Self-pay | Admitting: Emergency Medicine

## 2021-05-05 MED ORDER — NYSTATIN 100000 UNIT/ML MT SUSP: 5.0000 mL | Freq: Three times a day (TID) | OROMUCOSAL | 1 refills | Status: DC

## 2021-05-05 NOTE — Telephone Encounter (Signed)
Meds have been sent to the pharmacy per pts request.

## 2021-05-05 NOTE — Telephone Encounter (Signed)
Called and spoke with pt and she stated that she has thrush again and is needing the liquid mouthwash to use again.  Pt would like that sent to her pharmacy.  RB please advise.  Thanks

## 2021-05-05 NOTE — Telephone Encounter (Signed)
Yes ok to send nystatin S&S for her to use tid x 3 days.

## 2021-05-23 ENCOUNTER — Other Ambulatory Visit: Payer: Self-pay | Admitting: Orthopedic Surgery

## 2021-05-23 DIAGNOSIS — M5416 Radiculopathy, lumbar region: Secondary | ICD-10-CM

## 2021-05-28 ENCOUNTER — Other Ambulatory Visit: Payer: Self-pay | Admitting: Emergency Medicine

## 2021-06-06 ENCOUNTER — Ambulatory Visit: Payer: Medicare Other | Admitting: Podiatry

## 2021-06-22 ENCOUNTER — Other Ambulatory Visit: Payer: Medicare Other

## 2021-07-11 ENCOUNTER — Ambulatory Visit
Admission: RE | Admit: 2021-07-11 | Discharge: 2021-07-11 | Disposition: A | Discharge status: Home / Self Care | Payer: Medicare Other | Source: Ambulatory Visit | Attending: Orthopedic Surgery | Admitting: Orthopedic Surgery

## 2021-07-11 ENCOUNTER — Other Ambulatory Visit: Payer: Self-pay

## 2021-07-11 DIAGNOSIS — M5416 Radiculopathy, lumbar region: Secondary | ICD-10-CM

## 2021-07-12 ENCOUNTER — Encounter (HOSPITAL_BASED_OUTPATIENT_CLINIC_OR_DEPARTMENT_OTHER): Payer: Self-pay

## 2021-07-12 ENCOUNTER — Emergency Department (HOSPITAL_BASED_OUTPATIENT_CLINIC_OR_DEPARTMENT_OTHER): Payer: Medicare Other

## 2021-07-12 ENCOUNTER — Other Ambulatory Visit: Payer: Self-pay

## 2021-07-12 ENCOUNTER — Emergency Department (HOSPITAL_BASED_OUTPATIENT_CLINIC_OR_DEPARTMENT_OTHER)
Admission: EM | Admit: 2021-07-12 | Discharge: 2021-07-12 | Disposition: A | Discharge status: Home / Self Care | Payer: Medicare Other | Attending: Emergency Medicine | Admitting: Emergency Medicine

## 2021-07-12 DIAGNOSIS — I10 Essential (primary) hypertension: Secondary | ICD-10-CM | POA: Diagnosis not present

## 2021-07-12 DIAGNOSIS — Z79899 Other long term (current) drug therapy: Secondary | ICD-10-CM | POA: Diagnosis not present

## 2021-07-12 DIAGNOSIS — M25512 Pain in left shoulder: Secondary | ICD-10-CM | POA: Insufficient documentation

## 2021-07-12 DIAGNOSIS — J45909 Unspecified asthma, uncomplicated: Secondary | ICD-10-CM | POA: Insufficient documentation

## 2021-07-12 DIAGNOSIS — R531 Weakness: Secondary | ICD-10-CM | POA: Diagnosis not present

## 2021-07-12 LAB — CBC WITH DIFFERENTIAL/PLATELET
Abs Immature Granulocytes: 0.01 10*3/uL (ref 0.00–0.07)
Basophils Absolute: 0 10*3/uL (ref 0.0–0.1)
Basophils Relative: 0 %
Eosinophils Absolute: 0.1 10*3/uL (ref 0.0–0.5)
Eosinophils Relative: 2 %
HCT: 37.6 % (ref 36.0–46.0)
Hemoglobin: 12.6 g/dL (ref 12.0–15.0)
Immature Granulocytes: 0 %
Lymphocytes Relative: 28 %
Lymphs Abs: 1 10*3/uL (ref 0.7–4.0)
MCH: 30.1 pg (ref 26.0–34.0)
MCHC: 33.5 g/dL (ref 30.0–36.0)
MCV: 89.7 fL (ref 80.0–100.0)
Monocytes Absolute: 0.5 10*3/uL (ref 0.1–1.0)
Monocytes Relative: 14 %
Neutro Abs: 2.1 10*3/uL (ref 1.7–7.7)
Neutrophils Relative %: 56 %
Platelets: 237 10*3/uL (ref 150–400)
RBC: 4.19 MIL/uL (ref 3.87–5.11)
RDW: 13.5 % (ref 11.5–15.5)
WBC: 3.7 10*3/uL — ABNORMAL LOW (ref 4.0–10.5)
nRBC: 0 % (ref 0.0–0.2)

## 2021-07-12 LAB — BASIC METABOLIC PANEL
Anion gap: 9 (ref 5–15)
BUN: 15 mg/dL (ref 8–23)
CO2: 28 mmol/L (ref 22–32)
Calcium: 9.5 mg/dL (ref 8.9–10.3)
Chloride: 105 mmol/L (ref 98–111)
Creatinine, Ser: 1.03 mg/dL — ABNORMAL HIGH (ref 0.44–1.00)
GFR, Estimated: 54 mL/min — ABNORMAL LOW (ref >60)
Glucose, Bld: 86 mg/dL (ref 70–99)
Potassium: 3.9 mmol/L (ref 3.5–5.1)
Sodium: 142 mmol/L (ref 135–145)

## 2021-07-12 LAB — TROPONIN I (HIGH SENSITIVITY): Troponin I (High Sensitivity): 3 ng/L (ref <18)

## 2021-07-12 MED ORDER — TRAMADOL HCL 50 MG PO TABS: 50.0000 mg | ORAL_TABLET | Freq: Four times a day (QID) | ORAL | 0 refills | Status: DC | PRN

## 2021-07-12 MED ORDER — TRAMADOL HCL 50 MG PO TABS
50.0000 mg | ORAL_TABLET | Freq: Once | ORAL | Status: AC
  Administered 2021-07-12: 07:00:00 50 mg via ORAL
  Filled 2021-07-12: qty 1

## 2021-07-12 NOTE — ED Triage Notes (Signed)
Patient presents with complaint of pain to L scapula, shoulder and arm since Thursday.  She states she had MRI of her back yesterday.  Woke up tonight experiencing generalized weakness and nausea.  Denies chest pain.  Complains of shortness of breath, but states she has asthma and needs to take her medication.  Does not wear home supplemental oxygen.

## 2021-07-12 NOTE — ED Notes (Signed)
Pt discharged to home. Discharge instructions have been discussed with patient and/or family members. Pt verbally acknowledges understanding d/c instructions, and endorses comprehension to checkout at registration before leaving.  °

## 2021-07-12 NOTE — ED Provider Notes (Signed)
[facility] DEPART[facility] Provider Note   CSN: [id number] Arrival date & time: 07/12/21  0548     History  Chief Complaint  Patient presents with   Back Pain   Shoulder Pain   Arm Pain   Weakness   Nausea    [name] is a 86 y.o. female.  Patient is an 86 year old female with past medical history of asthma, hyperlipidemia, hypertension, prior TIA.  Patient presenting today with complaints of left shoulder pain and weakness.  Patient reports a fall several weeks ago.  She has been having pain in her low back since.  She was seen by orthopedic surgery and an MRI was performed yesterday with results unknown.  Patient started 3 days ago with pain to the left posterior shoulder that began in the absence of any new falls or other trauma.  The pain is worse when she moves the shoulder or palpates the area.  She denies any chest pain, but does report feeling short of breath which is not uncommon for her secondary to her asthma.  She denies any numbness or tingling.  She had tried taking over-the-counter medications and baking soda with little relief  The history is provided by the patient.  Shoulder Pain Location:  Shoulder Shoulder location:  L shoulder Injury: no   Pain details:    Quality:  Aching   Radiates to:  Does not radiate   Severity:  Moderate   Onset quality:  Gradual   Duration:  3 days   Timing:  Constant   Progression:  Worsening Relieved by:  Nothing Worsened by:  Movement Arm Pain  Weakness     Home Medications Prior to Admission medications   Medication Sig Start Date End Date Taking? Authorizing Provider  albuterol (PROVENTIL) (2.5 MG/3ML) 0.083% nebulizer solution Take 3 mLs (2.5 mg total) by nebulization 4 (four) times daily. 10/03/19   Martyn Ehrich, NP  albuterol (VENTOLIN HFA) 108 (90 Base) MCG/ACT inhaler INHALE TWO puffs into THE lungs every SIX hours AS NEEDED FOR SHORTNESS OF BREATH OR wheezing 05/30/21   Collene Gobble, MD  Alpha-D-Galactosidase (BEANO PO) Take 1 capsule by mouth as needed (for gas and bloating).     [provider]  budesonide-formoterol (SYMBICORT) 80-4.5 MCG/ACT inhaler Inhale 2 puffs into the lungs 2 (two) times daily. 02/14/21   Martyn Ehrich, NP  ciclopirox [facility]) 8 % solution Apply topically at bedtime. Apply over nail and surrounding skin. Apply daily over previous coat. After seven (7) days, may remove with alcohol and continue cycle. 03/17/21   Trula Slade, DPM  clopidogrel (PLAVIX) 75 MG tablet Take 75 mg by mouth daily. 09/24/14   [provider]  cycloSPORINE (RESTASIS) 0.05 % ophthalmic emulsion Place 1 drop into both eyes 2 (two) times daily.    [provider]  Echinacea 125 MG CAPS Take 125 mg by mouth daily.    [provider]  EPINEPHrine 0.3 mg/0.3 mL IJ SOAJ injection Inject 0.3 mg into the muscle once as needed (for a severe allergic reaction/use as directed).  02/01/18   [provider]  hydrochlorothiazide (MICROZIDE) 12.5 MG capsule Take 12.5 mg by mouth daily.  07/18/18   [provider]  losartan (COZAAR) 50 MG tablet Take 50 mg by mouth daily. 07/18/18   [provider]  Multiple Vitamin (MULTIVITAMIN) tablet Take 1 tablet by mouth daily.     [provider]  nystatin (MYCOSTATIN) 100000 UNIT/ML suspension Take 5  mLs (500,000 Units total) by mouth in the morning, at noon, and at bedtime. Swish and swallow x 3 DAYS 05/05/21   Collene Gobble, MD  omeprazole (PRILOSEC) 40 MG capsule Take 40 mg by mouth daily.    [provider]  Probiotic Product (PROBIOTIC PO) Take 1 capsule by mouth daily.     [provider]  Propylene Glycol (SYSTANE BALANCE) 0.6 % SOLN Place 1 drop into both eyes 4 (four) times daily as needed (dry eyes).    [provider]  Respiratory Therapy Supplies (FLUTTER) DEVI Use device 2-3 times a day to break up congestion 07/17/18   Martyn Ehrich,  NP  Spacer/Aero-Holding Chambers (AEROCHAMBER MV) inhaler Use as instructed 08/20/19   Magdalen Spatz, NP  sucralfate (CARAFATE) 1 GM/10ML suspension TAKE 10 ML BY MOUTH 4 TIMES DAILY 08/26/20   [provider]      Allergies    Cepacol, Contrast media [iodinated contrast media], Iodine, Lactose intolerance (gi), Mushroom extract complex, Other, Penicillins, Sulfamethoxazole, Ciprofloxacin, and Budesonide    Review of Systems   Review of Systems  All other systems reviewed and are negative.  Physical Exam Updated Vital Signs BP (!) 147/78 (BP Location: Right Arm)    Pulse (!) 58    Temp 97.7 F (36.5 C) (Oral)    Resp 20    Ht 4\' 9"  (1.448 m)    Wt 60.8 kg    SpO2 100%    BMI 29.00 kg/m  Physical Exam Vitals and nursing note reviewed.  Constitutional:      General: She is not in acute distress.    Appearance: She is well-developed. She is not diaphoretic.  HENT:     Head: Normocephalic and atraumatic.  Cardiovascular:     Rate and Rhythm: Normal rate and regular rhythm.     Heart sounds: No murmur heard.   No friction rub. No gallop.  Pulmonary:     Effort: Pulmonary effort is normal. No respiratory distress.     Breath sounds: Normal breath sounds. No wheezing.  Abdominal:     General: Bowel sounds are normal. There is no distension.     Palpations: Abdomen is soft.     Tenderness: There is no abdominal tenderness.  Musculoskeletal:        General: Normal range of motion.     Cervical back: Normal range of motion and neck supple.     Comments: There is tenderness to palpation to the posterior aspect of the left shoulder and into the scapula.  There is no palpable abnormality.  She has pain with abduction and external rotation.  Ulnar and radial pulses are easily palpable and motor and sensation are intact throughout the entire hand.  Skin:    General: Skin is warm and dry.  Neurological:     General: No focal deficit present.     Mental Status: She is alert and  oriented to person, place, and time.    ED Results / Procedures / Treatments   Labs (all labs ordered are listed, but only abnormal results are displayed) Labs Reviewed  BASIC METABOLIC PANEL  CBC WITH DIFFERENTIAL/PLATELET  TROPONIN I (HIGH SENSITIVITY)    EKG EKG Interpretation  Date/Time:  Tuesday July 12 2021 06:00:55 EST Ventricular Rate:  56 PR Interval:  171 QRS Duration: 86 QT Interval:  411 QTC Calculation: 397 R Axis:   68 Text Interpretation: Sinus rhythm Normal ECG Confirmed by Veryl Speak [phone]) on 07/12/2021 6:07:57 AM  Radiology No results found.  Procedures Procedures    Medications Ordered in ED Medications  traMADol (ULTRAM) tablet 50 mg (has no administration in time range)    ED Course/ Medical Decision Making/ A&P  Patient presenting here with complaints of shoulder pain, the etiology of which I suspect is musculoskeletal.  She is tender to palpation and pain is worse when she moves her arm.  She appears neurovascularly intact.  Initial EKG is unremarkable.  I will obtain laboratory studies to rule out a cardiac etiology.  Care signed out to oncoming provider at shift change.  Results and disposition pending.  Final Clinical Impression(s) / ED Diagnoses Final diagnoses:  None    Rx / DC Orders ED Discharge Orders     None         Veryl Speak, MD 07/13/21 2336

## 2021-07-12 NOTE — ED Provider Notes (Signed)
I received the patient in signout from Dr. Stark Jock, briefly the patient is a 86 year old female with a chief complaints of left shoulder pain that is worse with movement palpation and twisting.  Plan for single troponin blood work chest x-ray and plain film of the shoulder.  Plain film of the shoulder viewed by me without fracture or dislocation.  Signs of arthropathy.  Blood work is unremarkable mild leukopenia, no significant electrolyte abnormality.  Troponin is negative.  Will discharge home per plan.  PCP follow-up.   Deno Etienne, DO 07/12/21 [phone]

## 2021-07-12 NOTE — Discharge Instructions (Signed)
Wear arm sling for the next several days, then slowly begin to reintroduce activity as tolerated.  Take tramadol as prescribed as needed for pain.  Follow-up with your primary doctor if symptoms are not improving in the next week, and return to the ER if you develop chest pain, difficulty breathing, high fever, or other new and concerning symptoms.

## 2021-08-16 ENCOUNTER — Ambulatory Visit: Payer: Medicare Other | Admitting: Primary Care

## 2021-08-19 ENCOUNTER — Other Ambulatory Visit: Payer: Self-pay

## 2021-08-19 ENCOUNTER — Ambulatory Visit: Payer: Medicare Other | Admitting: Primary Care

## 2021-08-19 ENCOUNTER — Encounter: Payer: Self-pay | Admitting: Primary Care

## 2021-08-19 DIAGNOSIS — B370 Candidal stomatitis: Secondary | ICD-10-CM | POA: Diagnosis not present

## 2021-08-19 DIAGNOSIS — J309 Allergic rhinitis, unspecified: Secondary | ICD-10-CM

## 2021-08-19 DIAGNOSIS — J45909 Unspecified asthma, uncomplicated: Secondary | ICD-10-CM | POA: Diagnosis not present

## 2021-08-19 NOTE — Patient Instructions (Signed)
You are looking well [name] ? ?Recommendations: ?Continue Symbicort 58mcg as directed ?Use albuterol 2 puffs every 6 hours as needed for breakthrough shortness of breath/wheezing ?Start taking over the counter Claritin (loratadine) 10mg  daily during March-May for allergy season  ? ?Follow-up: ?6-8 months with [name] or Barbara Maize NP ?

## 2021-08-19 NOTE — Progress Notes (Signed)
@Patient  ID: [name], female    DOB: Oct 29, 1935, 86 y.o.   MRN: [id number]  Chief Complaint  Patient presents with   Follow-up    Follow up. Patient has no complaints.     Referring provider: Leighton Ruff, MD  HPI: 86 year old female, former light smoker quit 1961.  Past medical history significant for moderate asthma, HTN, TIA, allergic rhinitis, GERD, HLD, hx cholecystectomy. Patient of Dr. Lamonte Sakai.  Previous [facility] pulmonary encounter: 08/25/2020 Follow up : COPD w/ asthma , [place]  Patient returns for a 3-week follow-up.  Patient was seen last visit was unable to tolerate Stiolto.  She was changed to Brovana and budesonide. She has been tried on Symbicort, Stiolto with multiple side effects Unfortunately these did not get sent to the correct pharmacy.  We are resending them today.  She was also placed on a PPI with Protonix but was unable to tolerate due to headaches.  And is now back on omeprazole. She continues to have some intermittent cough wheezing and shortness of breath.  She has some postnasal drainage.  10/06/2020 Patient contacted today for televisit to review medications. Unable to tolerate Sitolto. Dr. Lamonte Sakai switched her to nebulized ICS/LABA. She has been waiting on prescription Brovana and budesonide but could never get it. Since pollen is so bad outside she has been struggling all the time with her breathing. She has been using albuterol which is not lasting her very long. She resume Symbicort 160 but reports that it is too strong and is requesting lower dose. She would still like to see if she can get nebulizer medication approved. We will send in prescription to [facility].   10/20/2020 Patient contacted today for 2 week follow-up. During last visit we started her on low dose Symbicort until we could switch her to nebulized ICS/LABA through [facility]. She has tolerated Symbicort 80 ok. She stopped using Symbicort when she received Budesonide but has not received  Perforomist (Brovana was not covered by insurance).   Intrinsic asthma: - She has had trouble with ICS in the past d/t thrush. Currently in the process of changing her over to nebulized ICS/LABA. She has received Budesonide nebulizer but is still awaiting Perforomist. Continue Symbicort 80 1-2 puffs twice a day until she receives both nebulizers   02/14/2021 Patient presents today for 3-4 month follow-up. Her maintenance regimen was changed at our last visit as she had trouble with ICS inhalers d/t thursh. Since then she has received both Budesonide and Perforomist nebulizer's.  She tested positive for covid in June 2022 and was treated with Zpack. She went back to using Symbicort 76mcg during this time. She felt nebulizer was too strong. UTI was caused by the soap she was using. [name] was exacerbated by GERD.  She is taking 40mg  omeprazole once a day. She is now rinsing her mouth at night and uses a throat spray. She no longer is getting thrush or yeast infection symptoms. She can now tolerate Symbicort, she has been taking 2 puffs in the morning. After covid she had residual cough and fatigue that has now resolved. States that she "feels good again". She very rarely requires her rescue inhaler, on average she uses SABA once a day. Strong smells will exacerbate her asthma symptoms.  Denies chest tightness or wheezing.   08/19/2021- Interim hx  Patient presents today for 6 month follow-up/ asthma. She is doing very well. She has no acute respiratory complaints. She was previously intolerant to symbicort d/t thursh, she  was tried on budesonide and performomist neb but felt these were too strong. She is now tolerating low dose symbicort 22mcg and prefers this. She takes two puffs of symbicort daily. Symtpoms are well controlled. She does not have any thrush symptoms. Her GERD symptoms are typically well controlled as long as she follows diet.   Allergies  Allergen Reactions   Cepacol Shortness Of Breath     Difficulty breathing   Contrast Media [Iodinated Contrast Media] Shortness Of Breath and Swelling    Pt reports "I could not breath at all and my face swelled up"    Iodine Shortness Of Breath   Lactose Intolerance (Gi) Shortness Of Breath    All dairy  Causes shortness of breath   Mushroom Extract Complex Shortness Of Breath   Other Hives, Shortness Of Breath and Swelling    Mushrooms (All mushrooms)   Penicillins Shortness Of Breath and Swelling    DID THE REACTION INVOLVE: Swelling of the face/tongue/throat, SOB, or low BP? Yes  Sudden or severe rash/hives, skin peeling, or the inside of the mouth or nose? No Did it require medical treatment? No When did it last happen? Over 10 years ago   If all above answers are NO, may proceed with cephalosporin use.    Sulfamethoxazole Anaphylaxis   Ciprofloxacin     Other reaction(s): Unknown   Budesonide Other (See Comments)    Headache    Immunization History  Administered Date(s) Administered   Influenza Inj Mdck Quad Pf 06/16/2018   Influenza,inj,Quad PF,6+ Mos 04/01/2019   Influenza,inj,Quad PF,6-35 Mos 06/03/2018   Influenza-Unspecified 04/19/2020   PFIZER(Purple Top)SARS-COV-2 Vaccination 09/23/2019, 10/14/2019, 05/04/2020   PPD Test 11/04/2019   Pneumococcal Conjugate-13 03/20/2016   Pneumococcal Polysaccharide-23 12/23/2004   Td 12/23/2004    Past Medical History:  Diagnosis Date   Arthritis    Asthma    Blood clotting disorder ([place])    Cataract    COPD (chronic obstructive pulmonary disease) ([place])    Depression    Diverticulosis 10-2011   Colonoscopy   GERD (gastroesophageal reflux disease)    Hemorrhoids, internal    Hx of colonic polyp    Hyperlipemia    Hypertension    IBS (irritable bowel syndrome)    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   EGD   TIA (transient ischemic attack)     Tobacco History: Social History   Tobacco Use  Smoking Status Former   Packs/day: 0.50   Years: 2.00   Pack years: 1.00    Types: Cigarettes   Start date: 18   Quit date: 09/28/1959   Years since quitting: 61.9   Passive exposure: Never  Smokeless Tobacco Never   Counseling given: Not Answered   Outpatient Medications Prior to Visit  Medication Sig Dispense Refill   albuterol (PROVENTIL) (2.5 MG/3ML) 0.083% nebulizer solution Take 3 mLs (2.5 mg total) by nebulization 4 (four) times daily. 150 mL 5   albuterol (VENTOLIN HFA) 108 (90 Base) MCG/ACT inhaler INHALE TWO puffs into THE lungs every SIX hours AS NEEDED FOR SHORTNESS OF BREATH OR wheezing 8.5 g 4   Alpha-D-Galactosidase (BEANO PO) Take 1 capsule by mouth as needed (for gas and bloating).      budesonide-formoterol (SYMBICORT) 80-4.5 MCG/ACT inhaler Inhale 2 puffs into the lungs 2 (two) times daily. 1 each 11   ciclopirox (PENLAC) 8 % solution Apply topically at bedtime. Apply over nail and surrounding skin. Apply daily over previous coat. After seven (7) days, may remove with alcohol and  continue cycle. 6.6 mL 0   clopidogrel (PLAVIX) 75 MG tablet Take 75 mg by mouth daily.  1   cycloSPORINE (RESTASIS) 0.05 % ophthalmic emulsion Place 1 drop into both eyes 2 (two) times daily.     Echinacea 125 MG CAPS Take 125 mg by mouth daily.     EPINEPHrine 0.3 mg/0.3 mL IJ SOAJ injection Inject 0.3 mg into the muscle once as needed (for a severe allergic reaction/use as directed).      hydrochlorothiazide (MICROZIDE) 12.5 MG capsule Take 12.5 mg by mouth daily.      losartan (COZAAR) 50 MG tablet Take 50 mg by mouth daily.     Multiple Vitamin (MULTIVITAMIN) tablet Take 1 tablet by mouth daily.      nystatin (MYCOSTATIN) 100000 UNIT/ML suspension Take 5 mLs (500,000 Units total) by mouth in the morning, at noon, and at bedtime. Swish and swallow x 3 DAYS 120 mL 1   omeprazole (PRILOSEC) 40 MG capsule Take 40 mg by mouth daily.     Probiotic Product (PROBIOTIC PO) Take 1 capsule by mouth daily.      Propylene Glycol (SYSTANE BALANCE) 0.6 % SOLN Place 1 drop into  both eyes 4 (four) times daily as needed (dry eyes).     Respiratory Therapy Supplies (FLUTTER) DEVI Use device 2-3 times a day to break up congestion 1 each 0   Spacer/Aero-Holding Chambers (AEROCHAMBER MV) inhaler Use as instructed 1 each 0   traMADol (ULTRAM) 50 MG tablet Take 1 tablet (50 mg total) by mouth every 6 (six) hours as needed. 15 tablet 0   sucralfate (CARAFATE) 1 GM/10ML suspension TAKE 10 ML BY MOUTH 4 TIMES DAILY (Patient not taking: Reported on 08/19/2021)     No facility-administered medications prior to visit.   Review of Systems  Review of Systems  Constitutional: Negative.   HENT: Negative.    Respiratory: Negative.    Cardiovascular: Negative.     Physical Exam  BP (!) 106/58 (BP Location: Right Arm, Patient Position: Sitting, Cuff Size: Normal)    Pulse 67    Temp 98.3 F (36.8 C) (Oral)    Ht 4\' 9"  (1.448 m)    Wt 133 lb (60.3 kg)    SpO2 98%    BMI 28.78 kg/m  Physical Exam Constitutional:      Appearance: Normal appearance.  HENT:     Head: Normocephalic and atraumatic.     Mouth/Throat:     Mouth: Mucous membranes are moist.     Pharynx: Oropharynx is clear.     Comments: No thrush symptoms, tongue is pink Cardiovascular:     Rate and Rhythm: Normal rate and regular rhythm.  Pulmonary:     Effort: Pulmonary effort is normal.     Breath sounds: Normal breath sounds. No wheezing, rhonchi or rales.  Musculoskeletal:        General: Normal range of motion.  Skin:    General: Skin is warm and dry.  Neurological:     General: No focal deficit present.     Mental Status: She is alert and oriented to person, place, and time. Mental status is at baseline.  Psychiatric:        Mood and Affect: Mood normal.        Behavior: Behavior normal.        Thought Content: Thought content normal.        Judgment: Judgment normal.     Lab Results:  CBC    Component Value  Date/Time   WBC 3.7 (L) 07/12/2021 0646   RBC 4.19 07/12/2021 0646   HGB 12.6  07/12/2021 0646   HCT 37.6 07/12/2021 0646   PLT 237 07/12/2021 0646   MCV 89.7 07/12/2021 0646   MCH 30.1 07/12/2021 0646   MCHC 33.5 07/12/2021 0646   RDW 13.5 07/12/2021 0646   LYMPHSABS 1.0 07/12/2021 0646   MONOABS 0.5 07/12/2021 0646   EOSABS 0.1 07/12/2021 0646   BASOSABS 0.0 07/12/2021 0646    BMET    Component Value Date/Time   NA 142 07/12/2021 0646   K 3.9 07/12/2021 0646   CL 105 07/12/2021 0646   CO2 28 07/12/2021 0646   GLUCOSE 86 07/12/2021 0646   BUN 15 07/12/2021 0646   CREATININE 1.03 (H) 07/12/2021 0646   CALCIUM 9.5 07/12/2021 0646   GFRNONAA 54 (L) 07/12/2021 0646   GFRAA >60 09/23/2019 2114    BNP No results found for: BNP  ProBNP No results found for: PROBNP  Imaging: No results found.   Assessment & Plan:   Intrinsic asthma - Stable interval; No recent exacerbations. She is tolerating low dose Symbicort. Rare SABA use. ACT score 20. Continue current regimen. Follow-up in 6 months or sooner if needed.   Allergic rhinitis - Allergic rhinitis ans asthma symptoms typically flares in spring. Advised she start taking loratadine 10mg  daily for the next 2-3 months.   Thrush - Resolved; No active problems with thrush    Martyn Ehrich, NP 08/19/2021

## 2021-08-19 NOTE — Assessment & Plan Note (Signed)
-   Allergic rhinitis ans asthma symptoms typically flares in spring. Advised she start taking loratadine 10mg  daily for the next 2-3 months.  ?

## 2021-08-19 NOTE — Assessment & Plan Note (Signed)
-   Stable interval; No recent exacerbations. She is tolerating low dose Symbicort. Rare SABA use. ACT score 20. Continue current regimen. Follow-up in 6 months or sooner if needed.  ?

## 2021-08-19 NOTE — Assessment & Plan Note (Signed)
-   Resolved; No active problems with thrush  ?

## 2021-09-28 ENCOUNTER — Other Ambulatory Visit: Payer: Self-pay | Admitting: Emergency Medicine

## 2021-12-23 ENCOUNTER — Other Ambulatory Visit: Payer: Self-pay | Admitting: Emergency Medicine

## 2022-02-22 ENCOUNTER — Encounter: Payer: Self-pay | Admitting: Primary Care

## 2022-02-22 ENCOUNTER — Ambulatory Visit: Payer: Medicare Other | Admitting: Primary Care

## 2022-02-22 ENCOUNTER — Telehealth: Payer: Self-pay | Admitting: Primary Care

## 2022-02-22 VITALS — BP 112/66 | HR 65 | Temp 98.6°F | Ht <= 58 in | Wt 129.4 lb

## 2022-02-22 DIAGNOSIS — J45909 Unspecified asthma, uncomplicated: Secondary | ICD-10-CM | POA: Diagnosis not present

## 2022-02-22 DIAGNOSIS — K219 Gastro-esophageal reflux disease without esophagitis: Secondary | ICD-10-CM | POA: Diagnosis not present

## 2022-02-22 DIAGNOSIS — B370 Candidal stomatitis: Secondary | ICD-10-CM

## 2022-02-22 MED ORDER — BUDESONIDE-FORMOTEROL FUMARATE 80-4.5 MCG/ACT IN AERO: 2.0000 | INHALATION_SPRAY | Freq: Two times a day (BID) | RESPIRATORY_TRACT | 5 refills | Status: DC

## 2022-02-22 MED ORDER — NYSTATIN 100000 UNIT/ML MT SUSP: 5.0000 mL | Freq: Three times a day (TID) | OROMUCOSAL | 1 refills | Status: DC

## 2022-02-22 NOTE — Assessment & Plan Note (Addendum)
-   Stable interval; No recent exacerbations. ACT score 19. She is tolerating low dose Symbicort for the most part. She uses SABA once daily. For now recommend continuing Symbicort 21mg 1 puff twice daily. If unable to tolerate d.t thrush may want to re-try nebulized budesonide/arformoterol. She has tried SMining engineerin the past and saw no clinical benefit. FU 6 months with Dr. BLamonte Sakaior sooner if needed.

## 2022-02-22 NOTE — Assessment & Plan Note (Addendum)
-   She has some intermittent thrush symptoms. She has milky white film to tongue that is not interfering with her taste and is not painful. Sending in RX for Nystatin S&S to take as needed. Try spacer with ICS inhaler and baking soda rinse after use.

## 2022-02-22 NOTE — Patient Instructions (Addendum)
Recommendations Continue Symbicort 1 puff morning and evening (rinse mouth after use) Use albuterol 2 puffs every 4-6 hours as needed for breakthrough shortness of breath or wheezing Use nystatin 4 times a day as needed for thrush symptoms Continue omeprazole 40 mg daily We will refer you to GI due to reflux symptoms Use flutter valve 2-3 times a day as needed for chest congestion/ loosen phlegm Continue to use incentive spirometer 5-10 deep breaths 3 times a day to encourage deep breathing If thrush continues we may need to discuss retrying nebulizers or try a different medication without a steroid in it  Referral: GI re: gerd   Follow-up 6 months with Dr. Lamonte Sakai (or his first available)

## 2022-02-22 NOTE — Assessment & Plan Note (Addendum)
-   Daily reflux symptoms. Continue Omeprazole '40mg'$  daily. Referring to GI for evaluation

## 2022-02-22 NOTE — Telephone Encounter (Signed)
[name] can you please call get patient a spacer and ask that she use baking soda/water as mouth rinse after using her inhaler. If thrush symptoms continue we can try changing Symbicort to [facility] at follow-up

## 2022-02-22 NOTE — Progress Notes (Signed)
$'@Patient's$  ID: [name], female    DOB: 07-01-1935, 86 y.o.   MRN: [id number]  Chief Complaint  Patient presents with   Follow-up    Referring provider: No ref. provider found  HPI: 86 year old female, former light smoker quit 1961.  Past medical history significant for moderate asthma, HTN, TIA, allergic rhinitis, GERD, HLD, hx cholecystectomy. Patient of Dr. Lamonte Sakai.  Previous [facility] pulmonary encounter: 08/25/2020 Follow up : COPD w/ asthma , [place]  Patient returns for a 3-week follow-up.  Patient was seen last visit was unable to tolerate Stiolto.  She was changed to Brovana and budesonide. She has been tried on Symbicort, Stiolto with multiple side effects Unfortunately these did not get sent to the correct pharmacy.  We are resending them today.  She was also placed on a PPI with Protonix but was unable to tolerate due to headaches.  And is now back on omeprazole. She continues to have some intermittent cough wheezing and shortness of breath.  She has some postnasal drainage.  10/06/2020 Patient contacted today for televisit to review medications. Unable to tolerate Sitolto. Dr. Lamonte Sakai switched her to nebulized ICS/LABA. She has been waiting on prescription Brovana and budesonide but could never get it. Since pollen is so bad outside she has been struggling all the time with her breathing. She has been using albuterol which is not lasting her very long. She resume Symbicort 160 but reports that it is too strong and is requesting lower dose. She would still like to see if she can get nebulizer medication approved. We will send in prescription to [facility].   10/20/2020 Patient contacted today for 2 week follow-up. During last visit we started her on low dose Symbicort until we could switch her to nebulized ICS/LABA through [facility]. She has tolerated Symbicort 80 ok. She stopped using Symbicort when she received Budesonide but has not received Perforomist (Brovana was not covered by  insurance).   Intrinsic asthma: - She has had trouble with ICS in the past d/t thrush. Currently in the process of changing her over to nebulized ICS/LABA. She has received Budesonide nebulizer but is still awaiting Perforomist. Continue Symbicort 80 1-2 puffs twice a day until she receives both nebulizers   02/14/2021 Patient presents today for 3-4 month follow-up. Her maintenance regimen was changed at our last visit as she had trouble with ICS inhalers d/t thursh. Since then she has received both Budesonide and Perforomist nebulizer's.  She tested positive for covid in June 2022 and was treated with Zpack. She went back to using Symbicort 60mg during this time. She felt nebulizer was too strong. UTI was caused by the soap she was using. [name] exacerbated by GERD.  She is taking '40mg'$  omeprazole once a day. She is now rinsing her mouth at night and uses a throat spray. She no longer is getting thrush or yeast infection symptoms. She can now tolerate Symbicort, she has been taking 2 puffs in the morning. After covid she had residual cough and fatigue that has now resolved. States that she "feels good again". She very rarely requires her rescue inhaler, on average she uses SABA once a day. Strong smells will exacerbate her asthma symptoms.  Denies chest tightness or wheezing.   08/19/2021 Patient presents today for 6 month follow-up/ asthma. She is doing very well. She has no acute respiratory complaints. She was previously intolerant to symbicort d/t thursh, she was tried on budesonide and performomist neb but felt these were too  strong. She is now tolerating low dose symbicort 58mg and prefers this. She takes two puffs of symbicort daily. Symtpoms are well controlled. She does not have any thrush symptoms. Her GERD symptoms are typically well controlled as long as she follows diet.    02/22/2022- Interim hx  Patient presents today for 6 month follow-up for asthma. She is doing well. She takes 1 puff  Symbicort 847m two times a day. She continues to have trouble with thursh. She has constant film on her tongue that is not painful. She has pain with reflux. She is taking prilosec '40mg'$  daily. Nebulized Budesonide/arformoterol were not convenient for her. Stiolto has not worked in the past. ACT score 19.   Allergies  Allergen Reactions   Cepacol Shortness Of Breath    Difficulty breathing   Contrast Media [Iodinated Contrast Media] Shortness Of Breath and Swelling    Pt reports "I could not breath at all and my face swelled up"    Iodine Shortness Of Breath   Lactose Intolerance (Gi) Shortness Of Breath    All dairy  Causes shortness of breath   Mushroom Extract Complex Shortness Of Breath   Other Hives, Shortness Of Breath and Swelling    Mushrooms (All mushrooms)   Penicillins Shortness Of Breath and Swelling    DID THE REACTION INVOLVE: Swelling of the face/tongue/throat, SOB, or low BP? Yes  Sudden or severe rash/hives, skin peeling, or the inside of the mouth or nose? No Did it require medical treatment? No When did it last happen? Over 10 years ago   If all above answers are "NO", may proceed with cephalosporin use.    Sulfamethoxazole Anaphylaxis   Ciprofloxacin     Other reaction(s): Unknown   Budesonide Other (See Comments)    Headache    Immunization History  Administered Date(s) Administered   Influenza Inj Mdck Quad Pf 06/16/2018   Influenza,inj,Quad PF,6+ Mos 04/01/2019   Influenza,inj,Quad PF,6-35 Mos 06/03/2018   Influenza-Unspecified 04/19/2020   PFIZER(Purple Top)SARS-COV-2 Vaccination 09/23/2019, 10/14/2019, 05/04/2020   PPD Test 11/04/2019   Pneumococcal Conjugate-13 03/20/2016   Pneumococcal Polysaccharide-23 12/23/2004   Td 12/23/2004    Past Medical History:  Diagnosis Date   Arthritis    Asthma    Blood clotting disorder ([facility]   Cataract    COPD (chronic obstructive pulmonary disease) ([facility]   Depression    Diverticulosis 10-2011    Colonoscopy   GERD (gastroesophageal reflux disease)    Hemorrhoids, internal    Hx of colonic polyp    Hyperlipemia    Hypertension    IBS (irritable bowel syndrome)    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   EGD   TIA (transient ischemic attack)     Tobacco History: Social History   Tobacco Use  Smoking Status Former   Packs/day: 0.50   Years: 2.00   Total pack years: 1.00   Types: Cigarettes   Start date: 1971 Quit date: 09/28/1959   Years since quitting: 62.4   Passive exposure: Never  Smokeless Tobacco Never   Counseling given: Not Answered   Outpatient Medications Prior to Visit  Medication Sig Dispense Refill   albuterol (PROVENTIL) (2.5 MG/3ML) 0.083% nebulizer solution Take 3 mLs (2.5 mg total) by nebulization 4 (four) times daily. 150 mL 5   albuterol (VENTOLIN HFA) 108 (90 Base) MCG/ACT inhaler INHALE TWO PUFFS BY MOUTH INTO LUNGS EVERY 6 HOURS AS NEEDED FOR SHORTNESS OF BREATH OR wheezing 8.5 g 2   Alpha-D-Galactosidase (BEANO  PO) Take 1 capsule by mouth as needed (for gas and bloating).      ciclopirox (PENLAC) 8 % solution Apply topically at bedtime. Apply over nail and surrounding skin. Apply daily over previous coat. After seven (7) days, may remove with alcohol and continue cycle. 6.6 mL 0   clopidogrel (PLAVIX) 75 MG tablet Take 75 mg by mouth daily.  1   cycloSPORINE (RESTASIS) 0.05 % ophthalmic emulsion Place 1 drop into both eyes 2 (two) times daily.     Echinacea 125 MG CAPS Take 125 mg by mouth daily.     EPINEPHrine 0.3 mg/0.3 mL IJ SOAJ injection Inject 0.3 mg into the muscle once as needed (for a severe allergic reaction/use as directed).      hydrochlorothiazide (MICROZIDE) 12.5 MG capsule Take 12.5 mg by mouth daily.      losartan (COZAAR) 50 MG tablet Take 50 mg by mouth daily.     Multiple Vitamin (MULTIVITAMIN) tablet Take 1 tablet by mouth daily.      omeprazole (PRILOSEC) 40 MG capsule Take 40 mg by mouth daily.     Probiotic Product (PROBIOTIC  PO) Take 1 capsule by mouth daily.      Propylene Glycol (SYSTANE BALANCE) 0.6 % SOLN Place 1 drop into both eyes 4 (four) times daily as needed (dry eyes).     Respiratory Therapy Supplies (FLUTTER) DEVI Use device 2-3 times a day to break up congestion 1 each 0   Spacer/Aero-Holding Chambers (AEROCHAMBER MV) inhaler Use as instructed 1 each 0   sucralfate (CARAFATE) 1 GM/10ML suspension      traMADol (ULTRAM) 50 MG tablet Take 1 tablet (50 mg total) by mouth every 6 (six) hours as needed. 15 tablet 0   budesonide-formoterol (SYMBICORT) 80-4.5 MCG/ACT inhaler Inhale 2 puffs into the lungs 2 (two) times daily. 1 each 11   nystatin (MYCOSTATIN) 100000 UNIT/ML suspension Take 5 mLs (500,000 Units total) by mouth in the morning, at noon, and at bedtime. Swish and swallow x 3 DAYS 120 mL 1   No facility-administered medications prior to visit.    Review of Systems  Review of Systems  Constitutional: Negative.   HENT: Negative.    Respiratory:  Negative for cough and shortness of breath.     Physical Exam  BP 112/66 (BP Location: Right Arm, Patient Position: Sitting, Cuff Size: Normal)   Pulse 65   Temp 98.6 F (37 C) (Oral)   Ht '4\' 7"'$  (1.397 m)   Wt 129 lb 6.4 oz (58.7 kg)   SpO2 96%   BMI 30.08 kg/m  Physical Exam Constitutional:      Appearance: Normal appearance.  HENT:     Head: Normocephalic and atraumatic.     Mouth/Throat:     Mouth: Mucous membranes are moist.     Pharynx: Oropharynx is clear.     Comments: - Milky white film to tongue Cardiovascular:     Rate and Rhythm: Normal rate and regular rhythm.  Pulmonary:     Effort: Pulmonary effort is normal.     Breath sounds: Normal breath sounds. No wheezing, rhonchi or rales.  Musculoskeletal:        General: Normal range of motion.     Cervical back: Normal range of motion and neck supple.  Skin:    General: Skin is warm and dry.  Neurological:     General: No focal deficit present.     Mental Status: She is  alert and oriented to person, place, and  time. Mental status is at baseline.  Psychiatric:        Mood and Affect: Mood normal.        Behavior: Behavior normal.        Thought Content: Thought content normal.        Judgment: Judgment normal.      Lab Results:  CBC    Component Value Date/Time   WBC 3.7 (L) 07/12/2021 0646   RBC 4.19 07/12/2021 0646   HGB 12.6 07/12/2021 0646   HCT 37.6 07/12/2021 0646   PLT 237 07/12/2021 0646   MCV 89.7 07/12/2021 0646   MCH 30.1 07/12/2021 0646   MCHC 33.5 07/12/2021 0646   RDW 13.5 07/12/2021 0646   LYMPHSABS 1.0 07/12/2021 0646   MONOABS 0.5 07/12/2021 0646   EOSABS 0.1 07/12/2021 0646   BASOSABS 0.0 07/12/2021 0646    BMET    Component Value Date/Time   NA 142 07/12/2021 0646   K 3.9 07/12/2021 0646   CL 105 07/12/2021 0646   CO2 28 07/12/2021 0646   GLUCOSE 86 07/12/2021 0646   BUN 15 07/12/2021 0646   CREATININE 1.03 (H) 07/12/2021 0646   CALCIUM 9.5 07/12/2021 0646   GFRNONAA 54 (L) 07/12/2021 0646   GFRAA >60 09/23/2019 2114    BNP No results found for: "BNP"  ProBNP No results found for: "PROBNP"  Imaging: No results found.   Assessment & Plan:   Intrinsic asthma - Stable interval; No recent exacerbations. ACT score 19. She is tolerating low dose Symbicort for the most part. She uses SABA once daily. For now recommend continuing Symbicort 13mg 1 puff twice daily. If unable to tolerate d.t thrush may want to re-try nebulized budesonide/arformoterol. She has tried SMining engineerin the past and saw no clinical benefit. FU 6 months with Dr. BLamonte Sakaior sooner if needed.  Thrush - She has some intermittent thrush symptoms. She has milky white film to tongue that is not interfering with her taste and is not painful. Sending in RX for Nystatin S&S to take as needed. Try spacer with ICS inhaler and baking soda rinse after use.   GERD - Daily reflux symptoms. Continue Omeprazole '40mg'$  daily. Referring to GI for evaluation     EMartyn Ehrich NP 02/22/2022

## 2022-02-22 NOTE — Telephone Encounter (Signed)
Reviewed incidence of oral candidiasis in [employer]. Incidence of thrush is relatively similar between all ICS-LABA. Ruthe Mannan appears to have the least incidence though coverage for [facility] may be dictated by insurance.  Symbicoort - 1-6% Advair Diskus - 1-10% Airduo - 1-10% Dulera - <1% in post-marketing data Breo - 2-5%  Knox Saliva, PharmD, MPH, BCPS, CPP Clinical Pharmacist (Rheumatology and Pulmonology)

## 2022-02-22 NOTE — Telephone Encounter (Signed)
Is there a ICS/LABA inhaler that has less side effects of thrush compared to Symbicort 66mg

## 2022-02-24 MED ORDER — AEROCHAMBER MV MISC: 0 refills | Status: DC

## 2022-02-24 NOTE — Telephone Encounter (Signed)
Called and spoke with pt letting her know the info from BW and she verbalized understanding. Rx for aerochamber sent to pharmacy for pt. Nothing further needed.

## 2022-03-11 ENCOUNTER — Other Ambulatory Visit: Payer: Self-pay | Admitting: Emergency Medicine

## 2022-04-21 ENCOUNTER — Ambulatory Visit: Payer: Medicare Other | Admitting: Primary Care

## 2022-04-21 ENCOUNTER — Encounter: Payer: Self-pay | Admitting: Primary Care

## 2022-04-21 ENCOUNTER — Telehealth: Payer: Self-pay | Admitting: Primary Care

## 2022-04-21 VITALS — BP 92/62 | HR 68 | Temp 98.4°F | Ht <= 58 in | Wt 125.0 lb

## 2022-04-21 DIAGNOSIS — J45909 Unspecified asthma, uncomplicated: Secondary | ICD-10-CM | POA: Diagnosis not present

## 2022-04-21 DIAGNOSIS — K219 Gastro-esophageal reflux disease without esophagitis: Secondary | ICD-10-CM | POA: Diagnosis not present

## 2022-04-21 DIAGNOSIS — B370 Candidal stomatitis: Secondary | ICD-10-CM | POA: Diagnosis not present

## 2022-04-21 DIAGNOSIS — J309 Allergic rhinitis, unspecified: Secondary | ICD-10-CM

## 2022-04-21 MED ORDER — LORATADINE 10 MG PO TABS: 10.0000 mg | ORAL_TABLET | Freq: Every day | ORAL | 5 refills | Status: DC

## 2022-04-21 MED ORDER — NYSTATIN 100000 UNIT/ML MT SUSP: 5.0000 mL | Freq: Three times a day (TID) | OROMUCOSAL | 1 refills | Status: DC

## 2022-04-21 NOTE — Telephone Encounter (Signed)
What's the incidence rate for allergic reactions with RSV vaccine

## 2022-04-21 NOTE — Patient Instructions (Addendum)
Recommendations: - Continue Prilosec (omeprazole) 40 mg daily - Continue Symbicort 75mg two puffs morning and evening - Start Loratadine (Claritin) '10mg'$  daily - Try using baking soda mouth wash or [employer] tooth paste/ also get plastic tongue scraper  - I will get back to you on incidence rate for RSV vaccine allergic reactions   RX: Nystatin s/s   Follow-up: - 6 months with DR. Byrum or sooner if needed   Respiratory Syncytial Virus (RSV) Vaccine Injection What is this medication? RESPIRATORY SYNCYTIAL VIRUS VACCINE (reh SPIR uh tor ee sin SISH uhl VY rus vak SEEN) reduces the risk of respiratory syncytial virus (RSV). It does not treat RSV. It is still possible to get RSV after receiving this vaccine, but the symptoms may be less severe or not last as long. It works by helping your immune system learn how to fight off a future infection. This medicine may be used for other purposes; ask your health care provider or pharmacist if you have questions. COMMON BRAND NAME(S): ABRYSVO, AREXVY What should I tell my care team before I take this medication? They need to know if you have any of these conditions: Immune system problems An unusual or allergic reaction to respiratory syncytial virus vaccine, other medications, foods, dyes, or preservatives Pregnant or trying to get pregnant Breastfeeding How should I use this medication? This vaccine is injected into a muscle. It is given by your care team. A copy of Vaccine Information Statements will be given before each vaccination. Be sure to read this information carefully each time. This sheet may change often. A copy of Vaccine Information Statements will be given before each vaccination. Be sure to read this information carefully each time. This sheet may change often. Talk to your care team about the use of this vaccine in children. It is not approved for use in children. Overdosage: If you think you have taken too much of this medicine  contact a poison control center or emergency room at once. NOTE: This medicine is only for you. Do not share this medicine with others. What if I miss a dose? This does not apply. What may interact with this medication? Medications that lower your chance of fighting infection This list may not describe all possible interactions. Give your health care provider a list of all the medicines, herbs, non-prescription drugs, or dietary supplements you use. Also tell them if you smoke, drink alcohol, or use illegal drugs. Some items may interact with your medicine. What should I watch for while using this medication? Visit your care team regularly. What side effects may I notice from receiving this medication? Side effects that you should report to your care team as soon as possible: Allergic reactions--skin rash, itching, hives, swelling of the face, lips, tongue, or throat Side effects that usually do not require medical attention (report these to your care team if they continue or are bothersome): Fatigue Feeling faint or lightheaded Headache Joint pain Muscle pain Pain, redness, or irritation at injection site This list may not describe all possible side effects. Call your doctor for medical advice about side effects. You may report side effects to FDA at 1-800-FDA-1088. Where should I keep my medication? This vaccine is only given by your care team. It will not be stored at home. NOTE: This sheet is a summary. It may not cover all possible information. If you have questions about this medicine, talk to your doctor, pharmacist, or health care provider.  2023 Elsevier/Gold Standard (2021-11-09 00:00:00)

## 2022-04-21 NOTE — Progress Notes (Unsigned)
$'@Patient'a$  ID: [name], female    DOB: January 22, 1936, 86 y.o.   MRN: [id number]  Chief Complaint  Patient presents with   Follow-up    Asthma flares with cold weather.  Doing well today    Referring provider: No ref. provider found  HPI: 86 year old female, former light smoker quit 1961.  Past medical history significant for moderate asthma, HTN, TIA, allergic rhinitis, GERD, HLD, hx cholecystectomy. Patient of Dr. Lamonte Sakai.  Previous [facility] pulmonary encounter: 08/25/2020 Follow up : COPD w/ asthma , [place]  Patient returns for a 3-week follow-up.  Patient was seen last visit was unable to tolerate Stiolto.  She was changed to Brovana and budesonide. She has been tried on Symbicort, Stiolto with multiple side effects Unfortunately these did not get sent to the correct pharmacy.  We are resending them today.  She was also placed on a PPI with Protonix but was unable to tolerate due to headaches.  And is now back on omeprazole. She continues to have some intermittent cough wheezing and shortness of breath.  She has some postnasal drainage.  10/06/2020 Patient contacted today for televisit to review medications. Unable to tolerate Sitolto. Dr. Lamonte Sakai switched her to nebulized ICS/LABA. She has been waiting on prescription Brovana and budesonide but could never get it. Since pollen is so bad outside she has been struggling all the time with her breathing. She has been using albuterol which is not lasting her very long. She resume Symbicort 160 but reports that it is too strong and is requesting lower dose. She would still like to see if she can get nebulizer medication approved. We will send in prescription to [facility].   10/20/2020 Patient contacted today for 2 week follow-up. During last visit we started her on low dose Symbicort until we could switch her to nebulized ICS/LABA through [facility]. She has tolerated Symbicort 80 ok. She stopped using Symbicort when she received Budesonide but has not  received Perforomist (Brovana was not covered by insurance).   Intrinsic asthma: - She has had trouble with ICS in the past d/t thrush. Currently in the process of changing her over to nebulized ICS/LABA. She has received Budesonide nebulizer but is still awaiting Perforomist. Continue Symbicort 80 1-2 puffs twice a day until she receives both nebulizers   02/14/2021 Patient presents today for 3-4 month follow-up. Her maintenance regimen was changed at our last visit as she had trouble with ICS inhalers d/t thursh. Since then she has received both Budesonide and Perforomist nebulizer's.  She tested positive for covid in June 2022 and was treated with Zpack. She went back to using Symbicort 15mg during this time. She felt nebulizer was too strong. UTI was caused by the soap she was using. [name] exacerbated by GERD.  She is taking '40mg'$  omeprazole once a day. She is now rinsing her mouth at night and uses a throat spray. She no longer is getting thrush or yeast infection symptoms. She can now tolerate Symbicort, she has been taking 2 puffs in the morning. After covid she had residual cough and fatigue that has now resolved. States that she "feels good again". She very rarely requires her rescue inhaler, on average she uses SABA once a day. Strong smells will exacerbate her asthma symptoms.  Denies chest tightness or wheezing.   08/19/2021 Patient presents today for 6 month follow-up/ asthma. She is doing very well. She has no acute respiratory complaints. She was previously intolerant to symbicort d/t thursh, she  was tried on budesonide and performomist neb but felt these were too strong. She is now tolerating low dose symbicort 55mg and prefers this. She takes two puffs of symbicort daily. Symtpoms are well controlled. She does not have any thrush symptoms. Her GERD symptoms are typically well controlled as long as she follows diet.   02/22/2022 Patient presents today for 6 month follow-up for asthma.  She is doing well. She takes 1 puff Symbicort 840m two times a day. She continues to have trouble with thursh. She has constant film on her tongue that is not painful. She has pain with reflux. She is taking prilosec '40mg'$  daily. Nebulized Budesonide/arformoterol were not convenient for her. Stiolto has not worked in the past. ACT score 19.  04/21/2022- Interim hx  Patient presents today for 2 month follow-up.  She is using Symbicort two puffs morning and evening. Tolerating ok. She still gets thrush. Nystatin refilled. Still having reflux.   Environmental allergies   PFTs in 2019 showed minimal obstruction, pos BD response    She has questions regarding RSV vaccine      Allergies  Allergen Reactions   Cepacol Shortness Of Breath    Difficulty breathing   Contrast Media [Iodinated Contrast Media] Shortness Of Breath and Swelling    Pt reports "I could not breath at all and my face swelled up"    Iodine Shortness Of Breath   Lactose Intolerance (Gi) Shortness Of Breath    All dairy  Causes shortness of breath   Mushroom Extract Complex Shortness Of Breath   Other Hives, Shortness Of Breath and Swelling    Mushrooms (All mushrooms)   Penicillins Shortness Of Breath and Swelling    DID THE REACTION INVOLVE: Swelling of the face/tongue/throat, SOB, or low BP? Yes  Sudden or severe rash/hives, skin peeling, or the inside of the mouth or nose? No Did it require medical treatment? No When did it last happen? Over 10 years ago   If all above answers are "NO", may proceed with cephalosporin use.    Sulfamethoxazole Anaphylaxis   Ciprofloxacin     Other reaction(s): Unknown   Budesonide Other (See Comments)    Headache    Immunization History  Administered Date(s) Administered   Influenza Inj Mdck Quad Pf 06/16/2018   Influenza,inj,Quad PF,6+ Mos 04/01/2019   Influenza,inj,Quad PF,6-35 Mos 06/03/2018   Influenza-Unspecified 04/19/2020, 03/03/2022   PFIZER(Purple Top)SARS-COV-2  Vaccination 09/23/2019, 10/14/2019, 05/04/2020   PPD Test 11/04/2019   Pneumococcal Conjugate-13 03/20/2016   Pneumococcal Polysaccharide-23 12/23/2004   Td 12/23/2004    Past Medical History:  Diagnosis Date   Arthritis    Asthma    Blood clotting disorder (HCC)    Cataract    COPD (chronic obstructive pulmonary disease) ([facility]   Depression    Diverticulosis 10-2011   Colonoscopy   GERD (gastroesophageal reflux disease)    Hemorrhoids, internal    Hx of colonic polyp    Hyperlipemia    Hypertension    IBS (irritable bowel syndrome)    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   EGD   TIA (transient ischemic attack)     Tobacco History: Social History   Tobacco Use  Smoking Status Former   Packs/day: 0.50   Years: 2.00   Total pack years: 1.00   Types: Cigarettes   Start date: 1920 Quit date: 09/28/1959   Years since quitting: 62.6   Passive exposure: Never  Smokeless Tobacco Never   Counseling given: Not Answered  Outpatient Medications Prior to Visit  Medication Sig Dispense Refill   albuterol (PROVENTIL) (2.5 MG/3ML) 0.083% nebulizer solution Take 3 mLs (2.5 mg total) by nebulization 4 (four) times daily. 150 mL 5   albuterol (VENTOLIN HFA) 108 (90 Base) MCG/ACT inhaler INHALE TWO PUFFS BY MOUTH INTO LUNGS EVERY 6 HOURS AS NEEDED FOR WHEEZING AND/OR SHORTNESS OF BREATH 8.5 g 2   Alpha-D-Galactosidase (BEANO PO) Take 1 capsule by mouth as needed (for gas and bloating).      budesonide-formoterol (SYMBICORT) 80-4.5 MCG/ACT inhaler Inhale 2 puffs into the lungs 2 (two) times daily. 1 each 5   ciclopirox (PENLAC) 8 % solution Apply topically at bedtime. Apply over nail and surrounding skin. Apply daily over previous coat. After seven (7) days, may remove with alcohol and continue cycle. 6.6 mL 0   clopidogrel (PLAVIX) 75 MG tablet Take 75 mg by mouth daily.  1   cycloSPORINE (RESTASIS) 0.05 % ophthalmic emulsion Place 1 drop into both eyes 2 (two) times daily.     Echinacea  125 MG CAPS Take 125 mg by mouth daily.     EPINEPHrine 0.3 mg/0.3 mL IJ SOAJ injection Inject 0.3 mg into the muscle once as needed (for a severe allergic reaction/use as directed).      hydrochlorothiazide (MICROZIDE) 12.5 MG capsule Take 12.5 mg by mouth daily.      losartan (COZAAR) 50 MG tablet Take 50 mg by mouth daily.     Multiple Vitamin (MULTIVITAMIN) tablet Take 1 tablet by mouth daily.      nystatin (MYCOSTATIN) 100000 UNIT/ML suspension Take 5 mLs (500,000 Units total) by mouth 3 (three) times daily. Swish and swallow x 3 DAYS 120 mL 1   omeprazole (PRILOSEC) 40 MG capsule Take 40 mg by mouth daily.     Probiotic Product (PROBIOTIC PO) Take 1 capsule by mouth daily.      Propylene Glycol (SYSTANE BALANCE) 0.6 % SOLN Place 1 drop into both eyes 4 (four) times daily as needed (dry eyes).     Respiratory Therapy Supplies (FLUTTER) DEVI Use device 2-3 times a day to break up congestion 1 each 0   Spacer/Aero-Holding Chambers (AEROCHAMBER MV) inhaler Use as instructed 1 each 0   sucralfate (CARAFATE) 1 GM/10ML suspension      traMADol (ULTRAM) 50 MG tablet Take 1 tablet (50 mg total) by mouth every 6 (six) hours as needed. 15 tablet 0   No facility-administered medications prior to visit.      Review of Systems  Review of Systems  Constitutional: Negative.   Respiratory: Negative.       Physical Exam  BP 92/62 (BP Location: Left Arm, Patient Position: Sitting, Cuff Size: Normal)   Pulse 68   Temp 98.4 F (36.9 C) (Oral)   Ht '4\' 7"'$  (1.397 m)   Wt 125 lb (56.7 kg)   SpO2 98%   BMI 29.05 kg/m  Physical Exam Constitutional:      Appearance: Normal appearance.  HENT:     Head: Normocephalic and atraumatic.     Mouth/Throat:     Mouth: Mucous membranes are moist.     Pharynx: Oropharynx is clear.     Comments: Film to tongue; NO ulcers or sores  Cardiovascular:     Rate and Rhythm: Normal rate and regular rhythm.     Comments: RRR Pulmonary:     Effort: Pulmonary  effort is normal.     Breath sounds: Normal breath sounds. No wheezing or rales.  Comments: CTA, no wheezing  Musculoskeletal:        General: Normal range of motion.  Neurological:     General: No focal deficit present.     Mental Status: She is alert and oriented to person, place, and time. Mental status is at baseline.  Psychiatric:        Mood and Affect: Mood normal.        Behavior: Behavior normal.        Thought Content: Thought content normal.        Judgment: Judgment normal.      Lab Results:  CBC    Component Value Date/Time   WBC 3.7 (L) 07/12/2021 0646   RBC 4.19 07/12/2021 0646   HGB 12.6 07/12/2021 0646   HCT 37.6 07/12/2021 0646   PLT 237 07/12/2021 0646   MCV 89.7 07/12/2021 0646   MCH 30.1 07/12/2021 0646   MCHC 33.5 07/12/2021 0646   RDW 13.5 07/12/2021 0646   LYMPHSABS 1.0 07/12/2021 0646   MONOABS 0.5 07/12/2021 0646   EOSABS 0.1 07/12/2021 0646   BASOSABS 0.0 07/12/2021 0646    BMET    Component Value Date/Time   NA 142 07/12/2021 0646   K 3.9 07/12/2021 0646   CL 105 07/12/2021 0646   CO2 28 07/12/2021 0646   GLUCOSE 86 07/12/2021 0646   BUN 15 07/12/2021 0646   CREATININE 1.03 (H) 07/12/2021 0646   CALCIUM 9.5 07/12/2021 0646   GFRNONAA 54 (L) 07/12/2021 0646   GFRAA >60 09/23/2019 2114    BNP No results found for: "BNP"  ProBNP No results found for: "PROBNP"  Imaging: No results found.   Assessment & Plan:   No problem-specific Assessment & Plan notes found for this encounter.     Martyn Ehrich, NP 04/21/2022

## 2022-04-23 NOTE — Assessment & Plan Note (Addendum)
-   Stable interval, no acute respiratory symptoms today. She has had past issues with ICS inhalers d/t thrush and she did not tolerant Stiolto d.t cough. She is currently on low dose Symbicort 67mg and seems to be tolerating this. Advised she brush her tongue with baking soda/water. If patient continues to struggle with thrush symptoms would recommend changing maintennce regiment to Brovana and budesonide 0.25 mg nebulizer treatment. Advised patient get RSV vaccine, information provided.

## 2022-04-23 NOTE — Assessment & Plan Note (Signed)
-   Continue Prilosec (omeprazole) 40 mg daily

## 2022-04-23 NOTE — Assessment & Plan Note (Signed)
-   She has a slight film to her tongue that is not interfering with her taste and is not painful. No open sores/ulcers or burning sensation. Refill Nystatin s/s. Advised she brushing with baking soda/water mixture and use spacer with HFA inhaler.

## 2022-04-23 NOTE — Assessment & Plan Note (Signed)
-   She has seasonal/environmental allergies. Not currently taking antihistamine. Advised she start Loratadine (Claritin) '10mg'$  daily

## 2022-04-24 NOTE — Telephone Encounter (Signed)
Injection site reaction was noted as pain (61%), redness (7.5%), and swelling (5.5%). There is no data presented concerning anaphylaxis after administration. Of note, patient received the RSV vaccine on 11/4 per [facility]; there is no brand information available.   Reference: Papi A, Ison MG, Oswaldo Done, et al. Respiratory Syncytial Virus Prefusion F Protein Vaccine in Older Adults. [facility] 2023;388(7):595-608. doi:10.1056/NEJMoa2209604.   [facility] PharmD Candidate [phone]

## 2022-05-08 ENCOUNTER — Encounter: Payer: Self-pay | Admitting: Internal Medicine

## 2022-05-21 ENCOUNTER — Other Ambulatory Visit: Payer: Self-pay | Admitting: Emergency Medicine

## 2022-06-02 ENCOUNTER — Ambulatory Visit: Payer: Medicare Other | Admitting: Emergency Medicine

## 2022-07-12 ENCOUNTER — Ambulatory Visit: Payer: Medicare Other | Admitting: Emergency Medicine

## 2022-07-17 ENCOUNTER — Encounter: Payer: Self-pay | Admitting: Gastroenterology

## 2022-08-18 ENCOUNTER — Other Ambulatory Visit: Payer: Self-pay | Admitting: Emergency Medicine

## 2022-08-18 DIAGNOSIS — M25552 Pain in left hip: Secondary | ICD-10-CM | POA: Diagnosis not present

## 2022-08-18 DIAGNOSIS — M6281 Muscle weakness (generalized): Secondary | ICD-10-CM | POA: Diagnosis not present

## 2022-08-18 DIAGNOSIS — M25562 Pain in left knee: Secondary | ICD-10-CM | POA: Diagnosis not present

## 2022-08-28 DIAGNOSIS — M25552 Pain in left hip: Secondary | ICD-10-CM | POA: Diagnosis not present

## 2022-08-28 DIAGNOSIS — M25562 Pain in left knee: Secondary | ICD-10-CM | POA: Diagnosis not present

## 2022-08-28 DIAGNOSIS — M6281 Muscle weakness (generalized): Secondary | ICD-10-CM | POA: Diagnosis not present

## 2022-09-05 NOTE — Progress Notes (Signed)
[name]    [id number]    06/30/1935  Primary Care Physician:[name], Barbara ManisElizabeth, MD (Inactive)  Referring Physician: Glenford BayleyWalsh, Barbara W, NP [address] [place] [id number]   Chief complaint:   Chief Complaint  Patient presents with   Gastroesophageal Reflux    Burning in esophagus, sore throat, sore tongue and being awakened during the night due to the burning. Notes weight loss (5 pound weight loss in less than 1 year) although she admits to having changed her diet to accommodate for reflux. No abdominal pain; no vomiting    HPI:87 year old very pleasant female, is here today for consult for acid refluxformer.  She was a light smoker quit 1961. Past medical history significant for moderate asthma, HTN, TIA, allergic rhinitis, GERD, HLD, hx cholecystectomy.   Today, she reports experiencing reflux along with a burning sensations in her esophagus accompanied with a sore throat.  Progressively worse in the past year, She reports that her omeprazole 40 mg hasn't been as effective as it was previously. She reports typically having a BM once a day. She states that she regularly takes flaxseed to help with her BM movement.  She reports having made changes to her diet to accommodate her reflux symptoms and attributes her weight loss to it. She states that she typically has 2 meals a day and continues to experience weight loss.  Appetite is normal  She denies any abdominal pain, blood in stool, black stool, vomiting, or dysphagia.  GI Hx:  Echo 05-30-12  EGD 11-16-11 -Polyps, multiple in the body and the antrum of the stomach -Stricture at the gastroesophageal junction-s/p maloney dilatation.  -otherwise normal  Colonoscopy 11-16-11 -Mild diverticulosis in the sigmund colon. -Diverticula, scattered in the mid transverse colon -Internal hemorrhoids -otherwise normal   Colonoscopy 11-11-04 -internal hemorrhoids -Colon poly- proximal ascending  colon   Current Outpatient Medications:    albuterol (VENTOLIN HFA) 108 (90 Base) MCG/ACT inhaler, INHALE TWO PUFFS BY MOUTH INTO LUNGS EVERY SIX HOURS AS NEEDED FOR WHEEZING AND/OR SHORTNESS OF BREATH, Disp: 8.5 g, Rfl: 2   Alpha-D-Galactosidase (BEANO PO), Take 1 capsule by mouth as needed (for gas and bloating). , Disp: , Rfl:    budesonide-formoterol (SYMBICORT) 80-4.5 MCG/ACT inhaler, Inhale 2 puffs into the lungs 2 (two) times daily., Disp: 1 each, Rfl: 5   clopidogrel (PLAVIX) 75 MG tablet, Take 75 mg by mouth daily., Disp: , Rfl: 1   hydrochlorothiazide (MICROZIDE) 12.5 MG capsule, Take 12.5 mg by mouth daily. , Disp: , Rfl:    loratadine (CLARITIN) 10 MG tablet, Take 1 tablet (10 mg total) by mouth daily. (Patient taking differently: Take 10 mg by mouth daily as needed.), Disp: 30 tablet, Rfl: 5   losartan (COZAAR) 50 MG tablet, Take 50 mg by mouth daily., Disp: , Rfl:    Multiple Vitamin (MULTIVITAMIN) tablet, Take 1 tablet by mouth daily. , Disp: , Rfl:    omeprazole (PRILOSEC) 40 MG capsule, Take 40 mg by mouth daily., Disp: , Rfl:    Probiotic Product (PROBIOTIC PO), Take 1 capsule by mouth daily. , Disp: , Rfl:    Propylene Glycol (SYSTANE BALANCE) 0.6 % SOLN, Place 1 drop into both eyes 4 (four) times daily as needed (dry eyes)., Disp: , Rfl:    Respiratory Therapy Supplies (FLUTTER) DEVI, Use device 2-3 times a day to break up congestion, Disp: 1 each, Rfl: 0   Spacer/Aero-Holding Chambers (AEROCHAMBER MV)  inhaler, Use as instructed, Disp: 1 each, Rfl: 0   traMADol (ULTRAM) 50 MG tablet, Take 1 tablet (50 mg total) by mouth every 6 (six) hours as needed., Disp: 15 tablet, Rfl: 0   albuterol (PROVENTIL) (2.5 MG/3ML) 0.083% nebulizer solution, Take 3 mLs (2.5 mg total) by nebulization 4 (four) times daily. (Patient not taking: Reported on 09/25/2022), Disp: 150 mL, Rfl: 5   cycloSPORINE (RESTASIS) 0.05 % ophthalmic emulsion, Place 1 drop into both eyes 2 (two) times daily. (Patient not  taking: Reported on 09/25/2022), Disp: , Rfl:    EPINEPHrine 0.3 mg/0.3 mL IJ SOAJ injection, Inject 0.3 mg into the muscle once as needed (for a severe allergic reaction/use as directed).  (Patient not taking: Reported on 09/25/2022), Disp: , Rfl:    Allergies as of 09/25/2022 - Review Complete 09/25/2022  Allergen Reaction Noted   Cepacol Shortness Of Breath 09/28/2011   Contrast media [iodinated contrast media] Shortness Of Breath and Swelling 05/24/2018   Iodine Shortness Of Breath 04/16/2018   Lactose intolerance (gi) Shortness Of Breath 12/29/2012   Mushroom extract complex Shortness Of Breath 06/25/2018   Other Hives, Shortness Of Breath, and Swelling 10/16/2014   Penicillins Shortness Of Breath and Swelling 05/02/2012   Sulfamethoxazole Anaphylaxis 05/02/2012   Ciprofloxacin  03/17/2020   Budesonide Other (See Comments) 02/14/2021    Past Medical History:  Diagnosis Date   Arthritis    Asthma    Blood clotting disorder    Cataract    COPD (chronic obstructive pulmonary disease)    Depression    Diverticulosis 10-2011   Colonoscopy   GERD (gastroesophageal reflux disease)    Hemorrhoids, internal    Hx of colonic polyp    Hyperlipemia    Hypertension    IBS (irritable bowel syndrome)    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   EGD   TIA (transient ischemic attack)     Past Surgical History:  Procedure Laterality Date   CESAREAN SECTION  1962   LAPAROSCOPIC CHOLECYSTECTOMY  1995    Family History  Problem Relation Age of Onset   Heart disease Mother    Hypertension Mother    Diabetes Mother    Asthma Father    Colon cancer Sister 45   Irritable bowel syndrome Sister    Breast cancer Other        Niece   Stomach cancer Neg Hx     Social History   Socioeconomic History   Marital status: Widowed    Spouse name: Not on file   Number of children: 1   Years of education: college   Highest education level: Not on file  Occupational History   Occupation: retired   Tobacco Use   Smoking status: Former    Packs/day: 0.50    Years: 2.00    Additional pack years: 0.00    Total pack years: 1.00    Types: Cigarettes    Start date: 89    Quit date: 09/28/1959    Years since quitting: 63.0    Passive exposure: Never   Smokeless tobacco: Never  Vaping Use   Vaping Use: Never used  Substance and Sexual Activity   Alcohol use: No    Alcohol/week: 0.0 standard drinks of alcohol   Drug use: No   Sexual activity: Not Currently  Other Topics Concern   Not on file  Social History Narrative   Patient is widowed with one child.   Patient is right handed.   Patient has college education.  Patient does not drink caffeine.   Social Determinants of Health   Financial Resource Strain: Not on file  Food Insecurity: Not on file  Transportation Needs: Not on file  Physical Activity: Not on file  Stress: Not on file  Social Connections: Not on file  Intimate Partner Violence: Not on file      Review of systems: Review of Systems  Constitutional:  Positive for unexpected weight change.  HENT:  Positive for sore throat and trouble swallowing.   Gastrointestinal:  Positive for abdominal distention (bloating). Negative for abdominal pain, blood in stool and vomiting.       +acid reflux +belching      Physical Exam: Vitals:   09/25/22 1011  BP: 108/60  Pulse: 72   Body mass index is 27.14 kg/m.  General: well-appearing   Eyes: sclera anicteric, no redness ENT: oral mucosa moist without lesions, no cervical or supraclavicular lymphadenopathy CV: RRR, no JVD, no peripheral edema Resp: clear to auscultation bilaterally, normal RR and effort noted GI: soft, no tenderness, with active bowel sounds. No guarding or palpable organomegaly noted. Skin; warm and dry, no rash or jaundice noted Neuro: awake, alert and oriented x 3. Normal gross motor function and fluent speech   Data Reviewed:  Reviewed labs, radiology imaging, old records and  pertinent past GI work up   Assessment and Plan/Recommendations:  87 year old very pleasant female with remote history of smoking, chronic GERD with complaints of worsening and persistent GERD symptoms despite daily PPI  Patient is very reluctant to undergo EGD or anesthesia.  Will plan to proceed with barium esophagram with tablet to exclude severe erosive esophagitis, neoplastic lesion or large hiatal hernia Continue omeprazole 40 mg daily Add Carafate suspension 1 g twice daily before meals Advised patient to eat small frequent meals to prevent further weight loss and to increase dietary protein and calorie intake  Return after barium esophagram   The patient was provided an opportunity to ask questions and all were answered. The patient agreed with the plan and demonstrated an understanding of the instructions.   CC: Glenford BayleyWalsh, Barbara W, NP  Ladona MowI,Safa Hewitt ShortsM Kadhim,acting as a scribe for Marsa ArisKavitha Kimora Stankovic, MD.,have documented all relevant documentation on the behalf of Marsa ArisKavitha Shantell Belongia, MD,as directed by  Marsa ArisKavitha Jasleen Riepe, MD while in the presence of Marsa ArisKavitha Konni Kesinger, MD.   I, Marsa ArisKavitha Neyah Ellerman, MD, have reviewed all documentation for this visit. The documentation on 09/25/22 for the exam, diagnosis, procedures, and orders are all accurate and complete.   Iona BeardK. Veena Jaymarie Yeakel , MD

## 2022-09-22 ENCOUNTER — Other Ambulatory Visit (HOSPITAL_COMMUNITY): Payer: Self-pay

## 2022-09-25 ENCOUNTER — Encounter: Payer: Self-pay | Admitting: Gastroenterology

## 2022-09-25 ENCOUNTER — Ambulatory Visit: Payer: Medicare Other | Admitting: Gastroenterology

## 2022-09-25 VITALS — BP 108/60 | HR 72 | Ht <= 58 in | Wt 125.4 lb

## 2022-09-25 DIAGNOSIS — K219 Gastro-esophageal reflux disease without esophagitis: Secondary | ICD-10-CM | POA: Diagnosis not present

## 2022-09-25 DIAGNOSIS — R634 Abnormal weight loss: Secondary | ICD-10-CM | POA: Diagnosis not present

## 2022-09-25 DIAGNOSIS — R12 Heartburn: Secondary | ICD-10-CM | POA: Diagnosis not present

## 2022-09-25 DIAGNOSIS — J029 Acute pharyngitis, unspecified: Secondary | ICD-10-CM | POA: Diagnosis not present

## 2022-09-25 MED ORDER — SUCRALFATE 1 GM/10ML PO SUSP: 1.0000 g | Freq: Two times a day (BID) | ORAL | 0 refills | Status: DC

## 2022-09-25 NOTE — Patient Instructions (Signed)
You have been scheduled for a Barium Esophogram at [facility] (1st floor of the hospital) on 10/05/2022 at 10 am . Please arrive 30 minutes prior to your appointment for registration. Make certain not to have anything to eat or drink 4  hours prior to your test. If you need to reschedule for any reason, please contact radiology at [phone] to do so. __________________________________________________________________ A barium swallow is an examination that concentrates on views of the esophagus. This tends to be a double contrast exam (barium and two liquids which, when combined, create a gas to distend the wall of the oesophagus) or single contrast (non-ionic iodine based). The study is usually tailored to your symptoms so a good history is essential. Attention is paid during the study to the form, structure and configuration of the esophagus, looking for functional disorders (such as aspiration, dysphagia, achalasia, motility and reflux) EXAMINATION You may be asked to change into a gown, depending on the type of swallow being performed. A radiologist and radiographer will perform the procedure. The radiologist will advise you of the type of contrast selected for your procedure and direct you during the exam. You will be asked to stand, sit or lie in several different positions and to hold a small amount of fluid in your mouth before being asked to swallow while the imaging is performed .In some instances you may be asked to swallow barium coated marshmallows to assess the motility of a solid food bolus. The exam can be recorded as a digital or video fluoroscopy procedure. POST PROCEDURE It will take 1-2 days for the barium to pass through your system. To facilitate this, it is important, unless otherwise directed, to increase your fluids for the next 24-48hrs and to resume your normal diet.  This test typically takes about 30 minutes to  perform. __________________________________________________________________________________   Continue Omeprazole 40 mg daily   We have sent the following medications to your pharmacy for you to pick up at your convenience: carafate  Gastroesophageal Reflux Disease, Adult Gastroesophageal reflux (GER) happens when acid from the stomach flows up into the tube that connects the mouth and the stomach (esophagus). Normally, food travels down the esophagus and stays in the stomach to be digested. However, when a person has GER, food and stomach acid sometimes move back up into the esophagus. If this becomes a more serious problem, the person may be diagnosed with a disease called gastroesophageal reflux disease (GERD). GERD occurs when the reflux: Happens often. Causes frequent or severe symptoms. Causes problems such as damage to the esophagus. When stomach acid comes in contact with the esophagus, the acid may cause inflammation in the esophagus. Over time, GERD may create small holes (ulcers) in the lining of the esophagus. What are the causes? This condition is caused by a problem with the muscle between the esophagus and the stomach (lower esophageal sphincter, or LES). Normally, the LES muscle closes after food passes through the esophagus to the stomach. When the LES is weakened or abnormal, it does not close properly, and that allows food and stomach acid to go back up into the esophagus. The LES can be weakened by certain dietary substances, medicines, and medical conditions, including: Tobacco use. Pregnancy. Having a hiatal hernia. Alcohol use. Certain foods and beverages, such as coffee, chocolate, onions, and peppermint. What increases the risk? You are more likely to develop this condition if you: Have an increased body weight. Have a connective tissue disorder. Take NSAIDs, such as ibuprofen. What  are the signs or symptoms? Symptoms of this condition  include: Heartburn. Difficult or painful swallowing and the feeling of having a lump in the throat. A bitter taste in the mouth. Bad breath and having a large amount of saliva. Having an upset or bloated stomach and belching. Chest pain. Different conditions can cause chest pain. Make sure you see your health care provider if you experience chest pain. Shortness of breath or wheezing. Ongoing (chronic) cough or a nighttime cough. Wearing away of tooth enamel. Weight loss. How is this diagnosed? This condition may be diagnosed based on a medical history and a physical exam. To determine if you have mild or severe GERD, your health care provider may also monitor how you respond to treatment. You may also have tests, including: A test to examine your stomach and esophagus with a small camera (endoscopy). A test that measures the acidity level in your esophagus. A test that measures how much pressure is on your esophagus. A barium swallow or modified barium swallow test to show the shape, size, and functioning of your esophagus. How is this treated? Treatment for this condition may vary depending on how severe your symptoms are. Your health care provider may recommend: Changes to your diet. Medicine. Surgery. The goal of treatment is to help relieve your symptoms and to prevent complications. Follow these instructions at home: Eating and drinking  Follow a diet as recommended by your health care provider. This may involve avoiding foods and drinks such as: Coffee and tea, with or without caffeine. Drinks that contain alcohol. Energy drinks and sports drinks. Carbonated drinks or sodas. Chocolate and cocoa. Peppermint and mint flavorings. Garlic and onions. Horseradish. Spicy and acidic foods, including peppers, chili powder, curry powder, vinegar, hot sauces, and barbecue sauce. Citrus fruit juices and citrus fruits, such as oranges, lemons, and limes. Tomato-based foods, such as red  sauce, chili, salsa, and pizza with red sauce. Fried and fatty foods, such as donuts, french fries, potato chips, and high-fat dressings. High-fat meats, such as hot dogs and fatty cuts of red and white meats, such as rib eye steak, sausage, ham, and bacon. High-fat dairy items, such as whole milk, butter, and cream cheese. Eat small, frequent meals instead of large meals. Avoid drinking large amounts of liquid with your meals. Avoid eating meals during the 2-3 hours before bedtime. Avoid lying down right after you eat. Do not exercise right after you eat. Lifestyle  Do not use any products that contain nicotine or tobacco. These products include cigarettes, chewing tobacco, and vaping devices, such as e-cigarettes. If you need help quitting, ask your health care provider. Try to reduce your stress by using methods such as yoga or meditation. If you need help reducing stress, ask your health care provider. If you are overweight, reduce your weight to an amount that is healthy for you. Ask your health care provider for guidance about a safe weight loss goal. General instructions Pay attention to any changes in your symptoms. Take over-the-counter and prescription medicines only as told by your health care provider. Do not take aspirin, ibuprofen, or other NSAIDs unless your health care provider told you to take these medicines. Wear loose-fitting clothing. Do not wear anything tight around your waist that causes pressure on your abdomen. Raise (elevate) the head of your bed about 6 inches (15 cm). You can use a wedge to do this. Avoid bending over if this makes your symptoms worse. Keep all follow-up visits. This is important. Contact  a health care provider if: You have: New symptoms. Unexplained weight loss. Difficulty swallowing or it hurts to swallow. Wheezing or a persistent cough. A hoarse voice. Your symptoms do not improve with treatment. Get help right away if: You have sudden  pain in your arms, neck, jaw, teeth, or back. You suddenly feel sweaty, dizzy, or light-headed. You have chest pain or shortness of breath. You vomit and the vomit is green, yellow, or black, or it looks like blood or coffee grounds. You faint. You have stool that is red, bloody, or black. You cannot swallow, drink, or eat. These symptoms may represent a serious problem that is an emergency. Do not wait to see if the symptoms will go away. Get medical help right away. Call your local emergency services (911 in the [place] Do not drive yourself to the hospital. Summary Gastroesophageal reflux happens when acid from the stomach flows up into the esophagus. GERD is a disease in which the reflux happens often, causes frequent or severe symptoms, or causes problems such as damage to the esophagus. Treatment for this condition may vary depending on how severe your symptoms are. Your health care provider may recommend diet and lifestyle changes, medicine, or surgery. Contact a health care provider if you have new or worsening symptoms. Take over-the-counter and prescription medicines only as told by your health care provider. Do not take aspirin, ibuprofen, or other NSAIDs unless your health care provider told you to do so. Keep all follow-up visits as told by your health care provider. This is important. This information is not intended to replace advice given to you by your health care provider. Make sure you discuss any questions you have with your health care provider. Document Revised: 12/15/2019 Document Reviewed: 12/15/2019 Elsevier Patient Education  2023 [employer].  Due to recent changes in healthcare laws, you may see the results of your imaging and laboratory studies on MyChart before your provider has had a chance to review them.  We understand that in some cases there may be results that are confusing or concerning to you. Not all laboratory results come back in the same time frame and the  provider may be waiting for multiple results in order to interpret others.  Please give [place] 48 hours in order for your provider to thoroughly review all the results before contacting the office for clarification of your results.    I appreciate the  opportunity to care for you  Thank You   Marsa Aris , MD

## 2022-10-04 DIAGNOSIS — H04123 Dry eye syndrome of bilateral lacrimal glands: Secondary | ICD-10-CM | POA: Diagnosis not present

## 2022-10-04 DIAGNOSIS — H2513 Age-related nuclear cataract, bilateral: Secondary | ICD-10-CM | POA: Diagnosis not present

## 2022-10-04 DIAGNOSIS — H52223 Regular astigmatism, bilateral: Secondary | ICD-10-CM | POA: Diagnosis not present

## 2022-10-04 DIAGNOSIS — H524 Presbyopia: Secondary | ICD-10-CM | POA: Diagnosis not present

## 2022-10-04 DIAGNOSIS — H5203 Hypermetropia, bilateral: Secondary | ICD-10-CM | POA: Diagnosis not present

## 2022-10-04 DIAGNOSIS — Z135 Encounter for screening for eye and ear disorders: Secondary | ICD-10-CM | POA: Diagnosis not present

## 2022-10-05 ENCOUNTER — Other Ambulatory Visit (HOSPITAL_COMMUNITY): Payer: Medicare Other

## 2022-10-18 ENCOUNTER — Other Ambulatory Visit: Payer: Self-pay | Admitting: Primary Care

## 2022-10-19 ENCOUNTER — Telehealth: Payer: Self-pay | Admitting: Primary Care

## 2022-10-19 MED ORDER — SYMBICORT 80-4.5 MCG/ACT IN AERO: 2.0000 | INHALATION_SPRAY | Freq: Two times a day (BID) | RESPIRATORY_TRACT | 12 refills | Status: DC

## 2022-10-19 NOTE — Telephone Encounter (Signed)
Called pt's pharmacy who stated that pt's insurance will only cover brand name Symbicort. New Rx sent specifying brand name. Nothing further needed.

## 2022-10-20 DIAGNOSIS — J208 Acute bronchitis due to other specified organisms: Secondary | ICD-10-CM | POA: Diagnosis not present

## 2022-10-30 ENCOUNTER — Other Ambulatory Visit: Payer: Self-pay

## 2022-10-30 ENCOUNTER — Telehealth: Payer: Self-pay | Admitting: Emergency Medicine

## 2022-10-30 NOTE — Telephone Encounter (Signed)
Symbicort was sent to pharmacy on 5/2. Will close encounter.

## 2022-10-30 NOTE — Telephone Encounter (Signed)
Spoke with patient. She advised she received letter from insurance advising prescriptions need to be sent to [facility]. She states she has never used them before and wants to contact insurance before anything is sent. Leaving encounter open until patient calls back

## 2022-10-30 NOTE — Telephone Encounter (Signed)
Pt needs a refill for her symbicort  Pharmacy: [facility]

## 2022-11-08 ENCOUNTER — Ambulatory Visit: Payer: Medicare Other | Admitting: Emergency Medicine

## 2022-11-08 ENCOUNTER — Encounter: Payer: Self-pay | Admitting: Emergency Medicine

## 2022-11-08 VITALS — BP 132/76 | HR 62 | Temp 98.0°F | Ht <= 58 in | Wt 130.0 lb

## 2022-11-08 DIAGNOSIS — K219 Gastro-esophageal reflux disease without esophagitis: Secondary | ICD-10-CM | POA: Diagnosis not present

## 2022-11-08 DIAGNOSIS — J45909 Unspecified asthma, uncomplicated: Secondary | ICD-10-CM

## 2022-11-08 MED ORDER — AEROCHAMBER MV MISC: 0 refills | Status: AC

## 2022-11-08 MED ORDER — SYMBICORT 80-4.5 MCG/ACT IN AERO: 2.0000 | INHALATION_SPRAY | Freq: Two times a day (BID) | RESPIRATORY_TRACT | 12 refills | Status: DC

## 2022-11-08 NOTE — Progress Notes (Signed)
Subjective:    Patient ID: Asli F Yingst, female    DOB: [birth year], 87 y.o.   MRN: [id number]  Asthma Her past medical history is significant for asthma.   ROV 11/06/19 --Ms. Mensing is 60, has a history of COPD/asthma with a positive bronchodilator response, intermittent allergic rhinitis and GERD.  We have been managing her on ICS/LABA but she is had significant difficulty with throat, tongue irritation, possible thrush.  She was changed to [facility] in late March to see if this would help and be easier to tolerate.  She had acute exacerbation of COPD 09/23/2019 and was treated with steroids.  Most recently she was tried on Brovana, was unable to get budesonide so she took low dose pred for a couple weeks, now off.  She has had to go back to generic symbicort. She does rinse and gargle after. She uses albuterol about 0-1x a day.   ROV 08/04/20 --follow-up visit for 87 year old woman with history of COPD/asthma (positive bronchodilator response), allergic rhinitis, GERD.  I have been managing her on Symbicort but she had been having difficulty avoiding thrush.  She was tried on [employer] in November 2021 she may benefit at some but she is currently off of it because it has been contributing to cough.  She started using albuterol neb qd. She has albuterol which she is using several times a day. Cold air is a trigger.  When she stopped the Stiolto her cough did improve some. She was also dealing with esophageal irritation at the same time. She is using omeprazole bid since she stopped the Stiolto, still has breakthrough reflux on this.   ROV 11/08/2022 --Ms. Gillian is 61 with a history of COPD/asthma, positive bronchodilator response.  She also has allergic rhinitis and GERD.  I last saw her 07/2020, most recently seen here by B Clent Ridges.  She has been managed most recently on Symbicort.  Has had trouble with multiple inhalers over the years due to thrush but seems to be tolerating.  Unfortunately she  had COVID-19 about 3 weeks ago. She has increased cough, dyspnea, some fever. Was treated with prednisone, abx. She is feeling better now - no fever, improved chest fullness. She is still having cough, especially at night. Taking OTC cough meds - coricidin. She still feels fatigue and exertional SOB.  Currently managed on Symbicort 80, omeprazole 40 mg daily.  She is using albuterol occasionally during the day. She coughs when supine. She still feels acid reflux. Allergies not bothersome right now, off loratadine.   Review of Systems Per HPI     Objective:   Physical Exam Vitals:   11/08/22 1123  BP: 132/76  Pulse: 62  Temp: 98 F (36.7 C)  TempSrc: Oral  SpO2: 98%  Weight: 130 lb (59 kg)  Height: 4\' 9"  (1.448 m)   Gen: Pleasant, elderly woman, in no distress,  normal affect  ENT: No lesions,  mouth clear, no thrush  Neck: No JVD, no stridor  Lungs: No use of accessory muscles, distant, no wheeze or crackles  Cardiovascular: RRR, heart sounds normal, no murmur or gallops, no peripheral edema  Musculoskeletal: No deformities, no cyanosis or clubbing  Neuro: alert, non focal  Skin: Warm, no lesions or rashes      Assessment & Plan:  Intrinsic asthma Overall doing well although she had flaring symptoms, was treated with prednisone and antibiotics when she had COVID-19 and subsequent bronchitis about 3 weeks ago.  She is improving but still has  fatigue.  Continues to cough especially at night with breakthrough GERD.  She is tolerating the Symbicort without thrush, will plan to continue this with a spacer.  We will continue Symbicort 80/4.5 mcg, 2 puffs twice a day.  Rinse and gargle after using.  Continue the usual spacer with this.  We will look into getting you a new spacer if possible Keep your albuterol available to use 2 puffs when you needed for shortness of breath, chest tightness, wheezing. Follow-up with APP in 6 months Follow Dr. Delton Coombes in 12 months, sooner if you  have problems.   GERD Contributing to her persistent cough.  We will do a short course of increased PPI to see if this will allow her cough to settle.   Levy Pupa, MD, PhD 11/08/2022, 11:42 AM [facility] [phone] or if no answer [phone]

## 2022-11-08 NOTE — Assessment & Plan Note (Signed)
Overall doing well although she had flaring symptoms, was treated with prednisone and antibiotics when she had COVID-19 and subsequent bronchitis about 3 weeks ago.  She is improving but still has fatigue.  Continues to cough especially at night with breakthrough GERD.  She is tolerating the Symbicort without thrush, will plan to continue this with a spacer.  We will continue Symbicort 80/4.5 mcg, 2 puffs twice a day.  Rinse and gargle after using.  Continue the usual spacer with this.  We will look into getting you a new spacer if possible Keep your albuterol available to use 2 puffs when you needed for shortness of breath, chest tightness, wheezing. Follow-up with APP in 6 months Follow Dr. Delton Coombes in 12 months, sooner if you have problems.

## 2022-11-08 NOTE — Patient Instructions (Signed)
We will continue Symbicort 80/4.5 mcg, 2 puffs twice a day.  Rinse and gargle after using.  Continue the usual spacer with this.  We will look into getting you a new spacer if possible Keep your albuterol available to use 2 puffs when you needed for shortness of breath, chest tightness, wheezing. Temporarily increase your omeprazole to 40 mg twice a day for 1 week.  Take this medication 1 hour around food.  After a week you can go back to taking it once daily. Restart your loratadine (generic Claritin) 10 mg once daily during the allergy seasons Follow-up with APP in 6 months Follow Dr. Delton Coombes in 12 months, sooner if you have problems.

## 2022-11-08 NOTE — Assessment & Plan Note (Signed)
Contributing to her persistent cough.  We will do a short course of increased PPI to see if this will allow her cough to settle.

## 2022-11-29 ENCOUNTER — Other Ambulatory Visit: Payer: Self-pay | Admitting: Emergency Medicine

## 2023-01-10 DIAGNOSIS — H18413 Arcus senilis, bilateral: Secondary | ICD-10-CM | POA: Diagnosis not present

## 2023-01-10 DIAGNOSIS — H2513 Age-related nuclear cataract, bilateral: Secondary | ICD-10-CM | POA: Diagnosis not present

## 2023-01-10 DIAGNOSIS — H04123 Dry eye syndrome of bilateral lacrimal glands: Secondary | ICD-10-CM | POA: Diagnosis not present

## 2023-01-10 DIAGNOSIS — H25013 Cortical age-related cataract, bilateral: Secondary | ICD-10-CM | POA: Diagnosis not present

## 2023-02-23 DIAGNOSIS — Z7901 Long term (current) use of anticoagulants: Secondary | ICD-10-CM | POA: Diagnosis not present

## 2023-02-23 DIAGNOSIS — J4550 Severe persistent asthma, uncomplicated: Secondary | ICD-10-CM | POA: Diagnosis not present

## 2023-02-23 DIAGNOSIS — E785 Hyperlipidemia, unspecified: Secondary | ICD-10-CM | POA: Diagnosis not present

## 2023-02-23 DIAGNOSIS — D508 Other iron deficiency anemias: Secondary | ICD-10-CM | POA: Diagnosis not present

## 2023-02-23 DIAGNOSIS — Z0000 Encounter for general adult medical examination without abnormal findings: Secondary | ICD-10-CM | POA: Diagnosis not present

## 2023-02-23 DIAGNOSIS — I10 Essential (primary) hypertension: Secondary | ICD-10-CM | POA: Diagnosis not present

## 2023-03-05 DIAGNOSIS — I10 Essential (primary) hypertension: Secondary | ICD-10-CM | POA: Diagnosis not present

## 2023-03-09 DIAGNOSIS — Z1231 Encounter for screening mammogram for malignant neoplasm of breast: Secondary | ICD-10-CM | POA: Diagnosis not present

## 2023-03-15 ENCOUNTER — Ambulatory Visit: Payer: Medicare Other | Admitting: Podiatry

## 2023-03-15 DIAGNOSIS — I739 Peripheral vascular disease, unspecified: Secondary | ICD-10-CM | POA: Diagnosis not present

## 2023-03-15 DIAGNOSIS — L84 Corns and callosities: Secondary | ICD-10-CM | POA: Diagnosis not present

## 2023-03-15 DIAGNOSIS — Z7901 Long term (current) use of anticoagulants: Secondary | ICD-10-CM | POA: Diagnosis not present

## 2023-03-15 NOTE — Progress Notes (Signed)
Subjective:  Patient ID: [name], female    DOB: 1935/08/02,  MRN: [id number]  Chief Complaint  Patient presents with   Callouses    Corn to right 4th toe, painful. Diabetic.     87 y.o. female presents for corn to the right fourth toe.  Patient says it is very painful to touch.  Patient does not have a history of diabetes but does have a history of peripheral arterial disease and takes a blood thinner including Plavix.  Objective:  Physical Exam: warm, good capillary refill nail exam onychomycosis of the toenails protective sensation intact DP and PT pulses are very faint on the right foot cap refill is diminished Left Foot: No pain with nails or hyperkeratotic lesions Right Foot: Hyper keratotic esion on the fourth toe lateral aspect of the distal interphalangeal joint with significant central hyperkeratotic tissue very painful to the touch.  Also with a second hyperkeratotic lesion underneath the plantar aspect of the fifth metatarsal head on the right foot no underlying ulceration  Assessment:   1. Corn of toe   2. PAD (peripheral artery disease) (HCC)   3. Chronic anticoagulation      Plan:  Patient was evaluated and treated and all questions answered.  #Hyperkeratotic lesions/pre ulcerative calluses present right fourth toe and plantar fifth metatarsal head All symptomatic hyperkeratoses x 2 separate lesions were safely debrided with a sterile #10 blade to patient's level of comfort without incident. We discussed preventative and palliative care of these lesions including supportive and accommodative shoegear, padding, prefabricated and custom molded accommodative orthoses, use of a pumice stone and lotions/creams daily.  Return if symptoms worsen or fail to improve.         Corinna Gab, DPM [facility] / [facility]

## 2023-04-27 ENCOUNTER — Emergency Department (HOSPITAL_COMMUNITY): Payer: Medicare Other

## 2023-04-27 ENCOUNTER — Encounter (HOSPITAL_COMMUNITY): Payer: Self-pay | Admitting: *Deleted

## 2023-04-27 ENCOUNTER — Observation Stay (HOSPITAL_COMMUNITY)
Admission: EM | Admit: 2023-04-27 | Discharge: 2023-04-29 | Disposition: A | Discharge status: Home / Self Care | Payer: Medicare Other | Attending: Family Medicine | Admitting: Family Medicine

## 2023-04-27 ENCOUNTER — Other Ambulatory Visit: Payer: Self-pay

## 2023-04-27 DIAGNOSIS — R200 Anesthesia of skin: Secondary | ICD-10-CM | POA: Diagnosis not present

## 2023-04-27 DIAGNOSIS — R531 Weakness: Secondary | ICD-10-CM | POA: Diagnosis not present

## 2023-04-27 DIAGNOSIS — Z8673 Personal history of transient ischemic attack (TIA), and cerebral infarction without residual deficits: Secondary | ICD-10-CM | POA: Insufficient documentation

## 2023-04-27 DIAGNOSIS — R29810 Facial weakness: Secondary | ICD-10-CM | POA: Diagnosis not present

## 2023-04-27 DIAGNOSIS — R110 Nausea: Secondary | ICD-10-CM | POA: Diagnosis not present

## 2023-04-27 DIAGNOSIS — I10 Essential (primary) hypertension: Secondary | ICD-10-CM | POA: Insufficient documentation

## 2023-04-27 DIAGNOSIS — G459 Transient cerebral ischemic attack, unspecified: Secondary | ICD-10-CM | POA: Diagnosis not present

## 2023-04-27 DIAGNOSIS — Z7902 Long term (current) use of antithrombotics/antiplatelets: Secondary | ICD-10-CM | POA: Diagnosis not present

## 2023-04-27 DIAGNOSIS — I6782 Cerebral ischemia: Secondary | ICD-10-CM | POA: Diagnosis not present

## 2023-04-27 DIAGNOSIS — F17210 Nicotine dependence, cigarettes, uncomplicated: Secondary | ICD-10-CM | POA: Diagnosis not present

## 2023-04-27 DIAGNOSIS — J4489 Other specified chronic obstructive pulmonary disease: Secondary | ICD-10-CM | POA: Diagnosis not present

## 2023-04-27 DIAGNOSIS — R29818 Other symptoms and signs involving the nervous system: Secondary | ICD-10-CM | POA: Diagnosis not present

## 2023-04-27 DIAGNOSIS — R42 Dizziness and giddiness: Secondary | ICD-10-CM | POA: Diagnosis not present

## 2023-04-27 DIAGNOSIS — Z7951 Long term (current) use of inhaled steroids: Secondary | ICD-10-CM | POA: Insufficient documentation

## 2023-04-27 DIAGNOSIS — R002 Palpitations: Secondary | ICD-10-CM | POA: Insufficient documentation

## 2023-04-27 DIAGNOSIS — R262 Difficulty in walking, not elsewhere classified: Secondary | ICD-10-CM | POA: Insufficient documentation

## 2023-04-27 DIAGNOSIS — R202 Paresthesia of skin: Secondary | ICD-10-CM | POA: Diagnosis not present

## 2023-04-27 DIAGNOSIS — E161 Other hypoglycemia: Secondary | ICD-10-CM | POA: Diagnosis not present

## 2023-04-27 DIAGNOSIS — Z79899 Other long term (current) drug therapy: Secondary | ICD-10-CM | POA: Diagnosis not present

## 2023-04-27 LAB — I-STAT CHEM 8, ED
BUN: 19 mg/dL (ref 8–23)
Calcium, Ion: 1.15 mmol/L (ref 1.15–1.40)
Chloride: 103 mmol/L (ref 98–111)
Creatinine, Ser: 1.2 mg/dL — ABNORMAL HIGH (ref 0.44–1.00)
Glucose, Bld: 113 mg/dL — ABNORMAL HIGH (ref 70–99)
HCT: 37 % (ref 36.0–46.0)
Hemoglobin: 12.6 g/dL (ref 12.0–15.0)
Potassium: 3.7 mmol/L (ref 3.5–5.1)
Sodium: 140 mmol/L (ref 135–145)
TCO2: 25 mmol/L (ref 22–32)

## 2023-04-27 LAB — DIFFERENTIAL
Abs Immature Granulocytes: 0.01 10*3/uL (ref 0.00–0.07)
Basophils Absolute: 0 10*3/uL (ref 0.0–0.1)
Basophils Relative: 1 %
Eosinophils Absolute: 0.1 10*3/uL (ref 0.0–0.5)
Eosinophils Relative: 1 %
Immature Granulocytes: 0 %
Lymphocytes Relative: 39 %
Lymphs Abs: 1.6 10*3/uL (ref 0.7–4.0)
Monocytes Absolute: 0.6 10*3/uL (ref 0.1–1.0)
Monocytes Relative: 14 %
Neutro Abs: 1.8 10*3/uL (ref 1.7–7.7)
Neutrophils Relative %: 45 %

## 2023-04-27 LAB — CBC
HCT: 35.5 % — ABNORMAL LOW (ref 36.0–46.0)
Hemoglobin: 12 g/dL (ref 12.0–15.0)
MCH: 31 pg (ref 26.0–34.0)
MCHC: 33.8 g/dL (ref 30.0–36.0)
MCV: 91.7 fL (ref 80.0–100.0)
Platelets: 246 10*3/uL (ref 150–400)
RBC: 3.87 MIL/uL (ref 3.87–5.11)
RDW: 13.4 % (ref 11.5–15.5)
WBC: 3.9 10*3/uL — ABNORMAL LOW (ref 4.0–10.5)
nRBC: 0 % (ref 0.0–0.2)

## 2023-04-27 LAB — COMPREHENSIVE METABOLIC PANEL
ALT: 22 U/L (ref 0–44)
AST: 28 U/L (ref 15–41)
Albumin: 3.7 g/dL (ref 3.5–5.0)
Alkaline Phosphatase: 51 U/L (ref 38–126)
Anion gap: 10 (ref 5–15)
BUN: 16 mg/dL (ref 8–23)
CO2: 24 mmol/L (ref 22–32)
Calcium: 9.4 mg/dL (ref 8.9–10.3)
Chloride: 106 mmol/L (ref 98–111)
Creatinine, Ser: 1.19 mg/dL — ABNORMAL HIGH (ref 0.44–1.00)
GFR, Estimated: 45 mL/min — ABNORMAL LOW (ref >60)
Glucose, Bld: 114 mg/dL — ABNORMAL HIGH (ref 70–99)
Potassium: 3.9 mmol/L (ref 3.5–5.1)
Sodium: 140 mmol/L (ref 135–145)
Total Bilirubin: 1.4 mg/dL — ABNORMAL HIGH (ref <1.2)
Total Protein: 6.8 g/dL (ref 6.5–8.1)

## 2023-04-27 LAB — URINALYSIS, ROUTINE W REFLEX MICROSCOPIC
Bilirubin Urine: NEGATIVE
Glucose, UA: NEGATIVE mg/dL
Hgb urine dipstick: NEGATIVE
Ketones, ur: NEGATIVE mg/dL
Nitrite: NEGATIVE
Protein, ur: NEGATIVE mg/dL
Specific Gravity, Urine: 1.005 (ref 1.005–1.030)
pH: 8 (ref 5.0–8.0)

## 2023-04-27 LAB — ETHANOL: Alcohol, Ethyl (B): <10 mg/dL (ref <10)

## 2023-04-27 LAB — APTT: aPTT: 26 s (ref 24–36)

## 2023-04-27 LAB — RAPID URINE DRUG SCREEN, HOSP PERFORMED
Amphetamines: NOT DETECTED
Barbiturates: NOT DETECTED
Benzodiazepines: NOT DETECTED
Cocaine: NOT DETECTED
Opiates: NOT DETECTED
Tetrahydrocannabinol: NOT DETECTED

## 2023-04-27 LAB — TSH: TSH: 1.583 u[IU]/mL (ref 0.350–4.500)

## 2023-04-27 LAB — PROTIME-INR
INR: 1.1 (ref 0.8–1.2)
Prothrombin Time: 14 s (ref 11.4–15.2)

## 2023-04-27 LAB — CBG MONITORING, ED: Glucose-Capillary: 117 mg/dL — ABNORMAL HIGH (ref 70–99)

## 2023-04-27 LAB — TROPONIN I (HIGH SENSITIVITY): Troponin I (High Sensitivity): 3 ng/L (ref <18)

## 2023-04-27 MED ORDER — ENOXAPARIN SODIUM 40 MG/0.4ML IJ SOSY: 40.0000 mg | PREFILLED_SYRINGE | INTRAMUSCULAR | Status: DC

## 2023-04-27 MED ORDER — ALBUTEROL SULFATE (2.5 MG/3ML) 0.083% IN NEBU: 2.5000 mg | INHALATION_SOLUTION | RESPIRATORY_TRACT | Status: DC | PRN

## 2023-04-27 MED ORDER — POLYVINYL ALCOHOL 1.4 % OP SOLN: 1.0000 [drp] | Freq: Four times a day (QID) | OPHTHALMIC | Status: DC | PRN

## 2023-04-27 MED ORDER — MOMETASONE FURO-FORMOTEROL FUM 100-5 MCG/ACT IN AERO
2.0000 | INHALATION_SPRAY | Freq: Two times a day (BID) | RESPIRATORY_TRACT | Status: DC
  Administered 2023-04-27 – 2023-04-29 (×4): 2 via RESPIRATORY_TRACT
  Filled 2023-04-27: qty 8.8

## 2023-04-27 MED ORDER — ENOXAPARIN SODIUM 30 MG/0.3ML IJ SOSY
30.0000 mg | PREFILLED_SYRINGE | INTRAMUSCULAR | Status: DC
  Administered 2023-04-28: 30 mg via SUBCUTANEOUS
  Filled 2023-04-27 (×2): qty 0.3

## 2023-04-27 MED ORDER — ASPIRIN 300 MG RE SUPP
300.0000 mg | Freq: Every day | RECTAL | Status: DC
  Filled 2023-04-27: qty 1

## 2023-04-27 MED ORDER — LORAZEPAM 1 MG PO TABS
1.0000 mg | ORAL_TABLET | ORAL | Status: AC
  Administered 2023-04-27: 1 mg via ORAL
  Filled 2023-04-27: qty 1

## 2023-04-27 MED ORDER — PANTOPRAZOLE SODIUM 40 MG PO TBEC
40.0000 mg | DELAYED_RELEASE_TABLET | Freq: Two times a day (BID) | ORAL | Status: DC
  Administered 2023-04-27 – 2023-04-29 (×4): 40 mg via ORAL
  Filled 2023-04-27 (×4): qty 1

## 2023-04-27 MED ORDER — ASPIRIN 81 MG PO TBEC
81.0000 mg | DELAYED_RELEASE_TABLET | Freq: Every day | ORAL | Status: DC
  Administered 2023-04-28 – 2023-04-29 (×2): 81 mg via ORAL
  Filled 2023-04-27 (×2): qty 1

## 2023-04-27 MED ORDER — LORATADINE 10 MG PO TABS: 10.0000 mg | ORAL_TABLET | Freq: Every day | ORAL | Status: DC | PRN

## 2023-04-27 MED ORDER — CLOPIDOGREL BISULFATE 75 MG PO TABS
75.0000 mg | ORAL_TABLET | Freq: Every day | ORAL | Status: DC
  Administered 2023-04-28 – 2023-04-29 (×2): 75 mg via ORAL
  Filled 2023-04-27 (×2): qty 1

## 2023-04-27 NOTE — ED Notes (Signed)
Messaged MRI back regarding PO ativan and when they were planning on taking Pt to MRI.

## 2023-04-27 NOTE — Code Documentation (Signed)
Stroke Response Nurse Documentation Code Documentation  [name] is a 87 y.o. female arriving to [facility]  via [facility] on 04/27/2023 with past medical hx of stroke. Code stroke was activated by EMS.   Patient from Urgent Care where she was LKW at 0900 and now complaining of worsening left sided weakness and numbness. Pt reports sudden onset of palpitations that started at 0930. They lasted for about 30 minutes and then subsided with worsening left sided weakness and numbness.    Stroke team at the bedside on patient arrival. Labs drawn and patient cleared for CT by EDP. Patient to CT with team. NIHSS 2, see documentation for details and code stroke times. Patient with left arm weakness and left decreased sensation on exam. The following imaging was completed:  CT Head. Patient is not a candidate for IV Thrombolytic due to too mild to treat. Patient is not a candidate for IR due to no suspected LVO.   Care Plan: q30 NIHSS/VS until 13:30p, then q2 x 12 hours. .   Bedside handoff with ED Paramedic Dahlia Client.    [name]  Stroke Response RN

## 2023-04-27 NOTE — ED Notes (Signed)
Called the lab to add on Trop and TSH.

## 2023-04-27 NOTE — ED Notes (Signed)
Patient transported to MRI 

## 2023-04-27 NOTE — ED Notes (Signed)
Asked Dr. Eloise Harman via secure chat for PO ativan for MRI due to claustrophobia. Difficult stick and no line at current time.

## 2023-04-27 NOTE — H&P (Cosign Needed Addendum)
Hospital Admission History and Physical Service Pager: [phone]  Patient name: [name] Medical record number: [id number] Date of Birth: [birth year] Age: 87 y.o. Gender: female  Primary Care Provider: Juluis Rainier, MD (Inactive) Consultants: neurology Code Status: DNR, may intubate Preferred Emergency Contact: Kateri Mc, [phone]  Chief Complaint: Left-sided weakness, left-sided sensation changes  Assessment and Plan: [name] is a 87 y.o. female with past history of TIA, stroke, COPD, hypertension, hyperlipidemia presenting with sudden left arm weakness and left-sided sensation changes. Differential for this patient's presentation of this includes stroke, TIA, recrudescence of previous stroke.  Suspect patient had TIA.  CT head and MRI reassuringly do not show evidence of acute stroke.  Clinical history and labs not consistent with trauma or injury upper extremity, cervical stenosis or, brachial injury to explain symptoms.  Assessment & Plan TIA (transient ischemic attack) Neurologically intact, symptoms reassuringly have resolved at time of exam. Reassuringly CT head and MRI are negative for stroke. Neuro has evaluated the patient, and is not a TNK candidate. Neuro consulted and stroke team to follow up tomorrow.  Admit to family medicine teaching service (admitted to Dr. Lloyd Huger), med telemetry floor Continue home Plavix 75 mg once daily, defer to neuro to start ASA NIH stroke scores every 4 hours Permissive hypertension for 24 hours, unless neuro says otherwise Telemetry monitoring PT/OT evaluation N.p.o. until SLP evaluation TTE tomorrow Lipid panel, A1c, other labs per neurology Palpitations Reported palpitations at time of suspected TIA.  Concerning for possible cardiac etiology of stroke symptoms.  Will monitor cardiac rhythm while inpatient.  Reassuringly, EKG shows sinus rhythm. Telemetry monitoring TTE tomorrow   Chronic and Stable  Conditions: Hypertension: Holding blood pressure medications GERD: Restarted pantoprazole 80 daily (on omeprazole 40 at home) COPD: Restarted albuterol as needed and restarted LABA/LAMA Dry eyes: Restarted propylene glycol ophthalmologic solution Allergic rhinitis: Restarted loratadine 10 mg as needed  FEN/GI: N.p.o. until speech evaluates VTE Prophylaxis: Enoxaparin 40  Disposition: Home pending stroke workup and risk factor modification.   History of Present Illness:  [name] is a 87 y.o. female with past history of TIA, stroke, COPD, hypertension, hyperlipidemia presenting with left arm weakness and left-sided weakness. Similar stroke syptoms in past. Symptoms started earlier today w/n window. L arm drift and decreased sensations. Not given TNK due to mild symptoms. Pt is also having palpitations. Denies UTI symptoms.   Started having symptoms at 9:30-10am this morning. Was getting some coffee, felt like her heart was racing and took her breath away. She had to sit down because she felt weak. At this time felt that the left arm got numb and she also felt some nausea and weakness. Denies having palpitations before. Endorses dizziness, denies vertigo, denies anxiety. Had some numbness in the fingers of left arm, foot, and leg. No facial changes. Daughter brought to urgent care, then sent to [facility] for stroke alert. Endorses headache this morning that has now resolved. Denies difficulty speaking.  In the ED, code stroke was initiated.  NIHSS of 2, left arm drift and left-sided sensation changes.  Patient received CT head without showed no evidence of large vessel occlusion or stroke parenchymal changes.  Within window for TNK but was deferred due to the mildness of presentation.  Patient underwent MRI and MRA both without contrast.  Review Of Systems: Per HPI, denies fevers, has some chills at night, no weight loss, no cold symptoms, no chest pain, no abdominal pain, positive reflux.  Pertinent Past Medical History: HTN HLD Asthma Hx of CVA  Pertinent Past Surgical History: Cesarean section in 1962 Laparoscopic cholecystectomy 925  Pertinent Social History: Tobacco use: Minimal pack-year history as a teenager.  Had smoked in 60 years. Alcohol use: None reported Other Substance use: None reported Lives alone.  Close relationship with children.  Independent in the ADLs and IADLs.  Pertinent Family History: Heart disease, hypertension, diabetes, asthma, colon cancer, irritable bowel syndrome in first degree relatives  Important Outpatient Medications: Albuterol as needed Clopidogrel 75 mg once daily Hydrochlorothiazide 12.5 once daily Losartan 50 mg once daily Loratadine 10 mg as needed Omeprazole 40 mg once daily Cymbalta 80-4.52 puffs in the morning 2 puffs at bedtime  Objective: BP 119/61   Pulse (!) 53   Temp 98 F (36.7 C) (Oral)   Resp 16   Ht 4\' 9"  (1.448 m)   Wt 61.2 kg   SpO2 99%   BMI 29.21 kg/m  Exam: General: Calm and cooperative.  Laying in bed.  Not acute distress. Eyes: No scleral icterus. ENTM: No gross abnormalities. Neck: Supple. Cardiovascular: Normal rate and rhythm.  Normal S1-S2.  No murmurs rubs or gallops. Respiratory: Lungs clear to auscultation bilaterally.  Normal effort. Gastrointestinal: Flat.  No tenderness to palpation. MSK: No obvious abnormalities. Derm: No obvious skin lesions. Neuro: Extraocular movements intact.  Visual fields intact.  No focal deficits facial muscles.  Strength 5/5 in all extremities.  Finger-nose intact.  Heel-to-shin intact.  Sensation equal bilaterally.  No dysarthria.  No evidence of aphasia based on conversation. Psych: Behavior appropriate to situation.  Labs:  CBC BMET  Recent Labs  Lab 04/27/23 1208 04/27/23 1212  WBC 3.9*  --   HGB 12.0 12.6  HCT 35.5* 37.0  PLT 246  --   Differential unremarkable Recent Labs  Lab 04/27/23 1208 04/27/23 1212  NA 140 140  K 3.9 3.7   CL 106 103  CO2 24  --   BUN 16 19  CREATININE 1.19* 1.20*  GLUCOSE 114* 113*  CALCIUM 9.4  --      PT, INR: 14, 1.1 Ethanol: Less than 10 UDS: Negative Urinalysis: Unremarkable Troponin: 3 TSH: 1.6  EKG: Normal sinus rhythm.  QTc 426.  No ischemic changes.   Imaging Studies Performed:  CT head without: No acute intracranial hemorrhage or evidence of acute large vessel territory infarct. ASPECT score is 10.  MRI brain without contrast:  1. No acute intracranial abnormality. 2. Mild to moderate chronic microvascular ischemic disease for age.  MRA brain without contrast:  Normal intracranial MRA.    Meryl Dare, MD PGY-1 Psychiatry Resident 04/27/2023, 6:52 PM  FPTS Intern pager: [phone], text pages welcome Secure chat group [facility] Teaching Service   I have verified that the resident's  findings are accurately documented in the resident's note. I have made edits and changes where appropriate, and agree with plan.  Celine Mans, MD, PGY-2 [facility] 7:34 PM 04/27/2023

## 2023-04-27 NOTE — ED Triage Notes (Signed)
Patient presents to ed via [facility] c/o increased weakness and heart palpitations this am at 0930 she experienced palpitations lasted for 30 mins. Afterwards started having left sided weakness and decreased sensation  states she had history of cva with same sx. Went to Urgent care.  States she is feeling better at present Patient is alert oriented.

## 2023-04-27 NOTE — Assessment & Plan Note (Addendum)
Neurologically intact, symptoms reassuringly have resolved at time of exam. Reassuringly CT head and MRI are negative for stroke. Neuro has evaluated the patient, and is not a TNK candidate. Neuro consulted and stroke team to follow up tomorrow.  Admit to family medicine teaching service (admitted to Dr. Lloyd Huger), med telemetry floor Continue home Plavix 75 mg once daily, defer to neuro to start ASA NIH stroke scores every 4 hours Permissive hypertension for 24 hours, unless neuro says otherwise Telemetry monitoring PT/OT evaluation N.p.o. until SLP evaluation TTE tomorrow Lipid panel, A1c, other labs per neurology

## 2023-04-27 NOTE — Plan of Care (Signed)

## 2023-04-27 NOTE — ED Notes (Signed)
ED TO INPATIENT HANDOFF REPORT  ED Nurse Name and Phone #: Lonny Prude  S Name/Age/Gender [name] 87 y.o. female Room/Bed: 004C/004C  Code Status   Code Status: Do not attempt resuscitation (DNR) PRE-ARREST INTERVENTIONS DESIRED  Home/SNF/Other Home Patient oriented to: self, place, time, and situation Is this baseline? Yes   Triage Complete: Triage complete  Chief Complaint TIA (transient ischemic attack) [G45.9]  Triage Note Patient presents to ed via [facility] c/o increased weakness and heart palpitations this am at 0930 she experienced palpitations lasted for 30 mins. Afterwards started having left sided weakness and decreased sensation  states she had history of cva with same sx. Went to Urgent care.  States she is feeling better at present Patient is alert oriented.     Allergies Allergies  Allergen Reactions   Cepacol Shortness Of Breath    Difficulty breathing   Contrast Media [Iodinated Contrast Media] Shortness Of Breath and Swelling    Pt reports "I could not breath at all and my face swelled up"    Iodine Shortness Of Breath   Lactose Intolerance (Gi) Shortness Of Breath    All dairy  Causes shortness of breath   Mushroom Extract Complex Shortness Of Breath   Other Hives, Shortness Of Breath and Swelling    Mushrooms (All mushrooms)   Penicillins Shortness Of Breath and Swelling    DID THE REACTION INVOLVE: Swelling of the face/tongue/throat, SOB, or low BP? Yes  Sudden or severe rash/hives, skin peeling, or the inside of the mouth or nose? No Did it require medical treatment? No When did it last happen? Over 10 years ago   If all above answers are "NO", may proceed with cephalosporin use.    Sulfamethoxazole Anaphylaxis   Ciprofloxacin     Other reaction(s): Unknown   Budesonide Other (See Comments)    Headache    Level of Care/Admitting Diagnosis ED Disposition     ED Disposition  Admit   Condition  --   Comment  Hospital Area:  [facility] [[id number]]  Level of Care: Telemetry Medical [104]  May place patient in observation at [facility] or [facility] if equivalent level of care is available:: No  Covid Evaluation: Asymptomatic - no recent exposure (last 10 days) testing not required  Diagnosis: TIA (transient ischemic attack) [409811]  Admitting Physician: Meryl Dare [[id number]]  Attending Physician: Nestor Ramp [[id number]]          B Medical/Surgery History Past Medical History:  Diagnosis Date   Arthritis    Asthma    Blood clotting disorder (HCC)    Cataract    COPD (chronic obstructive pulmonary disease) (HCC)    Depression    Diverticulosis 10-2011   Colonoscopy   GERD (gastroesophageal reflux disease)    Hemorrhoids, internal    Hx of colonic polyp    Hyperlipemia    Hypertension    IBS (irritable bowel syndrome)    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   EGD   TIA (transient ischemic attack)    Past Surgical History:  Procedure Laterality Date   CESAREAN SECTION  1962   LAPAROSCOPIC CHOLECYSTECTOMY  1995     A IV Location/Drains/Wounds Patient Lines/Drains/Airways Status     Active Line/Drains/Airways     None            Intake/Output Last 24 hours No intake or output data in the 24 hours ending 04/27/23 1714  Labs/Imaging Results for orders placed or performed during  the hospital encounter of 04/27/23 (from the past 48 hour(s))  CBG monitoring, ED     Status: Abnormal   Collection Time: 04/27/23 12:07 PM  Result Value Ref Range   Glucose-Capillary 117 (H) 70 - 99 mg/dL    Comment: Glucose reference range applies only to samples taken after fasting for at least 8 hours.  Ethanol     Status: None   Collection Time: 04/27/23 12:08 PM  Result Value Ref Range   Alcohol, Ethyl (B) <10 <10 mg/dL    Comment: (NOTE) Lowest detectable limit for serum alcohol is 10 mg/dL.  For medical purposes only. Performed at [facility], [address], [place],  [place] [address]   Protime-INR     Status: None   Collection Time: 04/27/23 12:08 PM  Result Value Ref Range   Prothrombin Time 14.0 11.4 - 15.2 seconds   INR 1.1 0.8 - 1.2    Comment: (NOTE) INR goal varies based on device and disease states. Performed at [facility], [address], [place], [place] [address]   APTT     Status: None   Collection Time: 04/27/23 12:08 PM  Result Value Ref Range   aPTT 26 24 - 36 seconds    Comment: Performed at [facility], [address], [place], [place] [address]  CBC     Status: Abnormal   Collection Time: 04/27/23 12:08 PM  Result Value Ref Range   WBC 3.9 (L) 4.0 - 10.5 K/uL   RBC 3.87 3.87 - 5.11 MIL/uL   Hemoglobin 12.0 12.0 - 15.0 g/dL   HCT 91.4 (L) 78.2 - 95.6 %   MCV 91.7 80.0 - 100.0 fL   MCH 31.0 26.0 - 34.0 pg   MCHC 33.8 30.0 - 36.0 g/dL   RDW 21.3 08.6 - 57.8 %   Platelets 246 150 - 400 K/uL   nRBC 0.0 0.0 - 0.2 %    Comment: Performed at [facility], [address], [place], [place] [address]  Differential     Status: None   Collection Time: 04/27/23 12:08 PM  Result Value Ref Range   Neutrophils Relative % 45 %   Neutro Abs 1.8 1.7 - 7.7 K/uL   Lymphocytes Relative 39 %   Lymphs Abs 1.6 0.7 - 4.0 K/uL   Monocytes Relative 14 %   Monocytes Absolute 0.6 0.1 - 1.0 K/uL   Eosinophils Relative 1 %   Eosinophils Absolute 0.1 0.0 - 0.5 K/uL   Basophils Relative 1 %   Basophils Absolute 0.0 0.0 - 0.1 K/uL   Immature Granulocytes 0 %   Abs Immature Granulocytes 0.01 0.00 - 0.07 K/uL    Comment: Performed at [facility], [address] [place], [place] [address]  Comprehensive metabolic panel     Status: Abnormal   Collection Time: 04/27/23 12:08 PM  Result Value Ref Range   Sodium 140 135 - 145 mmol/L   Potassium 3.9 3.5 - 5.1 mmol/L   Chloride 106 98 - 111 mmol/L   CO2 24 22 - 32 mmol/L   Glucose, Bld 114 (H) 70 - 99 mg/dL    Comment: Glucose reference range applies only to samples taken after  fasting for at least 8 hours.   BUN 16 8 - 23 mg/dL   Creatinine, Ser 1.32 (H) 0.44 - 1.00 mg/dL   Calcium 9.4 8.9 - 44.0 mg/dL   Total Protein 6.8 6.5 - 8.1 g/dL  Albumin 3.7 3.5 - 5.0 g/dL   AST 28 15 - 41 U/L   ALT 22 0 - 44 U/L   Alkaline Phosphatase 51 38 - 126 U/L   Total Bilirubin 1.4 (H) <1.2 mg/dL   GFR, Estimated 45 (L) >60 mL/min    Comment: (NOTE) Calculated using the CKD-EPI Creatinine Equation (2021)    Anion gap 10 5 - 15    Comment: Performed at [facility], [address] [place], [place] [address]  Troponin I (High Sensitivity)     Status: None   Collection Time: 04/27/23 12:10 PM  Result Value Ref Range   Troponin I (High Sensitivity) 3 <18 ng/L    Comment: (NOTE) Elevated high sensitivity troponin I (hsTnI) values and significant  changes across serial measurements may suggest ACS but many other  chronic and acute conditions are known to elevate hsTnI results.  Refer to the "Links" section for chest pain algorithms and additional  guidance. Performed at [facility], [address], [place], [place] [address]   TSH     Status: None   Collection Time: 04/27/23 12:10 PM  Result Value Ref Range   TSH 1.583 0.350 - 4.500 uIU/mL    Comment: Performed by a 3rd Generation assay with a functional sensitivity of <=0.01 uIU/mL. Performed at [facility], [address] [place], [place] [address]   I-stat chem 8, ED     Status: Abnormal   Collection Time: 04/27/23 12:12 PM  Result Value Ref Range   Sodium 140 135 - 145 mmol/L   Potassium 3.7 3.5 - 5.1 mmol/L   Chloride 103 98 - 111 mmol/L   BUN 19 8 - 23 mg/dL   Creatinine, Ser 3.87 (H) 0.44 - 1.00 mg/dL   Glucose, Bld 564 (H) 70 - 99 mg/dL    Comment: Glucose reference range applies only to samples taken after fasting for at least 8 hours.   Calcium, Ion 1.15 1.15 - 1.40 mmol/L   TCO2 25 22 - 32 mmol/L   Hemoglobin 12.6 12.0 - 15.0 g/dL   HCT 33.2 95.1 - 88.4 %  Urine rapid drug screen  (hosp performed)     Status: None   Collection Time: 04/27/23  3:24 PM  Result Value Ref Range   Opiates NONE DETECTED NONE DETECTED   Cocaine NONE DETECTED NONE DETECTED   Benzodiazepines NONE DETECTED NONE DETECTED   Amphetamines NONE DETECTED NONE DETECTED   Tetrahydrocannabinol NONE DETECTED NONE DETECTED   Barbiturates NONE DETECTED NONE DETECTED    Comment: (NOTE) DRUG SCREEN FOR MEDICAL PURPOSES ONLY.  IF CONFIRMATION IS NEEDED FOR ANY PURPOSE, NOTIFY LAB WITHIN 5 DAYS.  LOWEST DETECTABLE LIMITS FOR URINE DRUG SCREEN Drug Class                     Cutoff (ng/mL) Amphetamine and metabolites    1000 Barbiturate and metabolites    200 Benzodiazepine                 200 Opiates and metabolites        300 Cocaine and metabolites        300 THC                            50 Performed at [facility], [address] [place], [place] [address]   Urinalysis, Routine w reflex microscopic -Urine, Clean Catch  Status: Abnormal   Collection Time: 04/27/23  3:24 PM  Result Value Ref Range   Color, Urine STRAW (A) YELLOW   APPearance CLEAR CLEAR   Specific Gravity, Urine 1.005 1.005 - 1.030   pH 8.0 5.0 - 8.0   Glucose, UA NEGATIVE NEGATIVE mg/dL   Hgb urine dipstick NEGATIVE NEGATIVE   Bilirubin Urine NEGATIVE NEGATIVE   Ketones, ur NEGATIVE NEGATIVE mg/dL   Protein, ur NEGATIVE NEGATIVE mg/dL   Nitrite NEGATIVE NEGATIVE   Leukocytes,Ua SMALL (A) NEGATIVE   RBC / HPF 0-5 0 - 5 RBC/hpf   WBC, UA 0-5 0 - 5 WBC/hpf   Bacteria, UA RARE (A) NONE SEEN   Squamous Epithelial / HPF 0-5 0 - 5 /HPF    Comment: Performed at [facility], [address], [place], [place] [address]   CT HEAD CODE STROKE WO CONTRAST  Result Date: 04/27/2023 CLINICAL DATA:  Code stroke. Neuro deficit, acute, stroke suspected. Left facial weakness. EXAM: CT HEAD WITHOUT CONTRAST TECHNIQUE: Contiguous axial images were obtained from the base of the skull through the vertex without intravenous  contrast. RADIATION DOSE REDUCTION: This exam was performed according to the departmental dose-optimization program which includes automated exposure control, adjustment of the mA and/or kV according to patient size and/or use of iterative reconstruction technique. COMPARISON:  Head CT 07/19/2015. FINDINGS: Brain: No acute hemorrhage. Unchanged mild chronic small-vessel disease. Cortical gray-white differentiation is otherwise preserved. Prominence of the ventricles and sulci within expected range for age. No hydrocephalus or extra-axial collection. No mass effect or midline shift. Vascular: No hyperdense vessel or unexpected calcification. Skull: No calvarial fracture or suspicious bone lesion. Skull base is unremarkable. Sinuses/Orbits: No acute finding. Other: None. ASPECTS (Alberta Stroke Program Early CT Score) - Ganglionic level infarction (caudate, lentiform nuclei, internal capsule, insula, M1-M3 cortex): 7 - Supraganglionic infarction (M4-M6 cortex): 3 Total score (0-10 with 10 being normal): 10 IMPRESSION: No acute intracranial hemorrhage or evidence of acute large vessel territory infarct. ASPECT score is 10. Code stroke imaging results were communicated on 04/27/2023 at 12:21 pm to provider Dr. Selina Cooley via secure text paging. Electronically Signed   By: Orvan Falconer M.D.   On: 04/27/2023 12:21    Pending Labs Unresulted Labs (From admission, onward)     Start     Ordered   04/28/23 0500  Basic metabolic panel  Tomorrow morning,   R        04/27/23 1705   04/28/23 0500  CBC  Tomorrow morning,   R        04/27/23 1705   04/28/23 0500  Lipid panel  Tomorrow morning,   R        04/27/23 1705   04/28/23 0500  Hemoglobin A1c  Tomorrow morning,   R        04/27/23 1705            Vitals/Pain Today's Vitals   04/27/23 1600 04/27/23 1615 04/27/23 1630 04/27/23 1645  BP: (!) 152/64 (!) 139/102 (!) 158/64 (!) 145/63  Pulse: (!) 54 60 (!) 58 (!) 57  Resp: 14     Temp: 98 F (36.7 C)      TempSrc: Oral     SpO2: 100% 100% (!) 79% 100%  Weight:      Height:      PainSc:        Isolation Precautions No active isolations  Medications Medications  clopidogrel (PLAVIX) tablet 75 mg (has no administration in time range)  pantoprazole (PROTONIX) EC tablet 40 mg (has no administration in time range)  albuterol (PROVENTIL) (2.5 MG/3ML) 0.083% nebulizer solution 2.5 mg (has no administration in time range)  loratadine (CLARITIN) tablet 10 mg (has no administration in time range)  mometasone-formoterol (DULERA) 100-5 MCG/ACT inhaler 2 puff (has no administration in time range)  Propylene Glycol 0.6 % SOLN 1 drop (has no administration in time range)  enoxaparin (LOVENOX) injection 40 mg (has no administration in time range)  LORazepam (ATIVAN) tablet 1 mg (1 mg Oral Given 04/27/23 1311)    Mobility walks with device     Focused Assessments Neuro Assessment Handoff:  Swallow screen pass? Yes  Cardiac Rhythm: Normal sinus rhythm NIH Stroke Scale  Dizziness Present: No Headache Present: No Interval: Other (Comment) Level of Consciousness (1a.)   : Alert, keenly responsive LOC Questions (1b. )   : Answers both questions correctly LOC Commands (1c. )   : Performs both tasks correctly Best Gaze (2. )  : Normal Visual (3. )  : No visual loss Facial Palsy (4. )    : Normal symmetrical movements Motor Arm, Left (5a. )   : No drift Motor Arm, Right (5b. ) : No drift Motor Leg, Left (6a. )  : No drift Motor Leg, Right (6b. ) : No drift Limb Ataxia (7. ): Absent Sensory (8. )  : Normal, no sensory loss Best Language (9. )  : No aphasia Dysarthria (10. ): Normal Extinction/Inattention (11.)   : No Abnormality Complete NIHSS TOTAL: 1 Last date known well: 04/27/23 Last time known well: 0900 Neuro Assessment: Exceptions to WDL Neuro Checks:   Initial (04/27/23 1215)  Has TPA been given? No If patient is a Neuro Trauma and patient is going to OR before floor call report  to 4N Charge nurse: [phone] or [phone]   R Recommendations: See Admitting Provider Note  Report given to:   Additional Notes:

## 2023-04-27 NOTE — ED Notes (Signed)
Called lab to follow up on trop and TSH.  Stated it was in process.

## 2023-04-27 NOTE — ED Provider Notes (Signed)
[place] EMERGENCY DEPARTMENT AT [facility] Provider Note   CSN: [id number] Arrival date & time: 04/27/23  1204  An emergency department physician performed an initial assessment on this suspected stroke patient at 1208.  History  Chief Complaint  Patient presents with   Code Stroke    [name] is a 87 y.o. female.  87 year old female with a history of stroke on Plavix with left upper extremity numbness, hypertension, hyperlipidemia, and COPD who presents to the emergency department with left-sided numbness and weakness of her arm.  Says that this morning at 10 AM started feeling palpitations.  Around that time started feeling some numbness going down her arm as well as her left leg.  She reported that she had had a stroke with similar symptoms.  Initially went to urgent care and then was brought to the emergency department.  Says that the numbness appears to have improved but is still slightly there.  Not currently having palpitations.  Denies any vision changes, difficulty speaking, or chest pain.  No recent illnesses.       Home Medications Prior to Admission medications   Medication Sig Start Date End Date Taking? Authorizing Provider  albuterol (PROVENTIL) (2.5 MG/3ML) 0.083% nebulizer solution Take 3 mLs (2.5 mg total) by nebulization 4 (four) times daily. 10/03/19  Yes Glenford Bayley, NP  albuterol (VENTOLIN HFA) 108 (90 Base) MCG/ACT inhaler INHALE TWO PUFFS BY MOUTH INTO LUNGS EVERY SIX HOURS AS NEEDED FOR WHEEZING AND/OR SHORTNESS OF BREATH 11/29/22  Yes Byrum, Les Pou, MD  Alpha-D-Galactosidase (BEANO PO) Take 1 capsule by mouth as needed (for gas and bloating).    Yes [provider]  atorvastatin (LIPITOR) 20 MG tablet Take 20 mg by mouth daily. 12/25/22  Yes [provider]  cholecalciferol (VITAMIN D3) 25 MCG (1000 UNIT) tablet Take 1,000 Units by mouth daily.   Yes [provider]  clopidogrel (PLAVIX) 75 MG tablet Take  75 mg by mouth daily. 09/24/14  Yes [provider]  cycloSPORINE (RESTASIS) 0.05 % ophthalmic emulsion Place 1 drop into both eyes 2 (two) times daily.   Yes [provider]  EPINEPHrine 0.3 mg/0.3 mL IJ SOAJ injection Inject 0.3 mg into the muscle once as needed (for a severe allergic reaction/use as directed). 02/01/18  Yes [provider]  hydrochlorothiazide (MICROZIDE) 12.5 MG capsule Take 12.5 mg by mouth daily.  07/18/18  Yes [provider]  loratadine (CLARITIN) 10 MG tablet Take 1 tablet (10 mg total) by mouth daily. Patient taking differently: Take 10 mg by mouth daily as needed. 04/21/22  Yes Glenford Bayley, NP  losartan (COZAAR) 50 MG tablet Take 50 mg by mouth daily. 07/18/18  Yes [provider]  Multiple Vitamin (MULTIVITAMIN) tablet Take 1 tablet by mouth daily.    Yes [provider]  omeprazole (PRILOSEC) 40 MG capsule Take 40 mg by mouth daily.   Yes [provider]  Probiotic Product (PROBIOTIC PO) Take 1 capsule by mouth daily.    Yes [provider]  SYMBICORT 80-4.5 MCG/ACT inhaler Inhale 2 puffs into the lungs in the morning and at bedtime. 11/08/22  Yes Leslye Peer, MD  Respiratory Therapy Supplies (FLUTTER) DEVI Use device 2-3 times a day to break up congestion 07/17/18   Glenford Bayley, NP  Spacer/Aero-Holding Chambers (AEROCHAMBER MV) inhaler Use as instructed 11/08/22   Leslye Peer, MD      Allergies    Cepacol, Contrast media [iodinated contrast  media], Iodine, Lactose intolerance (gi), Mushroom extract complex, Other, Penicillins, Sulfamethoxazole, Ciprofloxacin, and Budesonide    Review of Systems   Review of Systems  Physical Exam Updated Vital Signs BP (!) 145/63   Pulse (!) 57   Temp 98 F (36.7 C) (Oral)   Resp 14   Ht 4\' 9"  (1.448 m)   Wt 61.2 kg   SpO2 100%   BMI 29.21 kg/m  Physical Exam Vitals and nursing note reviewed.  Constitutional:      General: She is not  in acute distress.    Appearance: She is well-developed.  HENT:     Head: Normocephalic and atraumatic.     Right Ear: External ear normal.     Left Ear: External ear normal.     Nose: Nose normal.  Eyes:     Extraocular Movements: Extraocular movements intact.     Conjunctiva/sclera: Conjunctivae normal.     Pupils: Pupils are equal, round, and reactive to light.  Cardiovascular:     Rate and Rhythm: Normal rate and regular rhythm.     Heart sounds: No murmur heard.    Comments: Radial and DP pulses 2+ bilaterally Pulmonary:     Effort: Pulmonary effort is normal. No respiratory distress.     Breath sounds: Normal breath sounds.  Musculoskeletal:     Cervical back: Normal range of motion and neck supple.     Right lower leg: No edema.     Left lower leg: No edema.  Skin:    General: Skin is warm and dry.  Neurological:     Mental Status: She is alert and oriented to person, place, and time. Mental status is at baseline.     Comments: NIHSS Exam  Level of Consciousness: Alert  LOC Questions: Answers Month and Age Correctly  LOC Commands: Opens and Closes Eyes and Hands on command  Best Gaze: Horizontal ocular movements intact  Visual Fields: No visual field loss  Facial Palsy: None  L Upper Extremity Motor: No drift after 10 seconds  R Upper Extremity Motor: No drift after 10 seconds  L Lower extremity Motor: No drift after 5 seconds  R Lower extremity Motor: No drift after 5 seconds  Ataxia: Absent  Sensory: Intact sensation to light touch on face, arms, trunk, and legs bilaterally  Best Language: No aphasia  Dysarthria: No dysarthria  Neglect: No visual or sensory neglect    Psychiatric:        Mood and Affect: Mood normal.     ED Results / Procedures / Treatments   Labs (all labs ordered are listed, but only abnormal results are displayed) Labs Reviewed  CBC - Abnormal; Notable for the following components:      Result Value   WBC 3.9 (*)    HCT 35.5 (*)     All other components within normal limits  COMPREHENSIVE METABOLIC PANEL - Abnormal; Notable for the following components:   Glucose, Bld 114 (*)    Creatinine, Ser 1.19 (*)    Total Bilirubin 1.4 (*)    GFR, Estimated 45 (*)    All other components within normal limits  URINALYSIS, ROUTINE W REFLEX MICROSCOPIC - Abnormal; Notable for the following components:   Color, Urine STRAW (*)    Leukocytes,Ua SMALL (*)    Bacteria, UA RARE (*)    All other components within normal limits  I-STAT CHEM 8, ED - Abnormal; Notable for the following components:   Creatinine, Ser 1.20 (*)  Glucose, Bld 113 (*)    All other components within normal limits  CBG MONITORING, ED - Abnormal; Notable for the following components:   Glucose-Capillary 117 (*)    All other components within normal limits  ETHANOL  PROTIME-INR  APTT  DIFFERENTIAL  RAPID URINE DRUG SCREEN, HOSP PERFORMED  TSH  BASIC METABOLIC PANEL  CBC  LIPID PANEL  HEMOGLOBIN A1C  TROPONIN I (HIGH SENSITIVITY)    EKG EKG Interpretation Date/Time:  Friday April 27 2023 12:22:22 EST Ventricular Rate:  69 PR Interval:  160 QRS Duration:  89 QT Interval:  397 QTC Calculation: 426 R Axis:   61  Text Interpretation: Sinus rhythm Confirmed by Vonita Moss [phone]) on 04/27/2023 12:25:59 PM  Radiology MR BRAIN WO CONTRAST  Result Date: 04/27/2023 CLINICAL DATA:  Initial evaluation for acute neuro deficit, stroke suspected. EXAM: MRI HEAD WITHOUT CONTRAST MRA HEAD WITHOUT CONTRAST TECHNIQUE: Multiplanar, multi-echo pulse sequences of the brain and surrounding structures were acquired without intravenous contrast. Angiographic images of the Circle of Willis were acquired using MRA technique without intravenous contrast. COMPARISON:  CT from earlier the same day. FINDINGS: MRI HEAD FINDINGS Brain: Cerebral volume within normal limits. Patchy T2/FLAIR hyperintensity involving the periventricular and deep white matter of both  cerebral hemispheres as well as the pons, consistent with chronic small vessel ischemic disease, mild to moderate in nature. No evidence for acute or subacute ischemia. Gray-white matter differentiation maintained. No acute or chronic intracranial blood products. No mass lesion, midline shift or mass effect. No hydrocephalus or extra-axial fluid collection. Pituitary gland suprasellar region within normal limits. Vascular: Major intracranial vascular flow voids are maintained. Skull and upper cervical spine: Craniocervical junction within normal limits. Bone marrow signal intensity normal. No scalp soft tissue abnormality. Sinuses/Orbits: Globes and orbital soft tissues are within normal limits. Paranasal sinuses and mastoid air cells are largely clear. Other: None. MRA HEAD FINDINGS Anterior circulation: Both internal carotid arteries widely patent to the termini without stenosis. A1 segments widely patent. Normal anterior communicating artery complex. Both anterior cerebral arteries widely patent to their distal aspects without stenosis. No M1 stenosis or occlusion. Normal MCA bifurcations. Distal MCA branches well perfused and symmetric. Posterior circulation: Both V4 segments patent to the vertebrobasilar junction without stenosis. Both PICA origins patent and normal. Basilar widely patent to its distal aspect without stenosis. Superior cerebellar arteries patent bilaterally. Both PCAs primarily supplied via the basilar and are well perfused to there distal aspects. Anatomic variants: None significant.  No aneurysm. IMPRESSION: MRI HEAD: 1. No acute intracranial abnormality. 2. Mild to moderate chronic microvascular ischemic disease for age. MRA HEAD: Normal intracranial MRA. Electronically Signed   By: Rise Mu M.D.   On: 04/27/2023 17:29   MR ANGIO HEAD WO CONTRAST  Result Date: 04/27/2023 CLINICAL DATA:  Initial evaluation for acute neuro deficit, stroke suspected. EXAM: MRI HEAD WITHOUT  CONTRAST MRA HEAD WITHOUT CONTRAST TECHNIQUE: Multiplanar, multi-echo pulse sequences of the brain and surrounding structures were acquired without intravenous contrast. Angiographic images of the Circle of Willis were acquired using MRA technique without intravenous contrast. COMPARISON:  CT from earlier the same day. FINDINGS: MRI HEAD FINDINGS Brain: Cerebral volume within normal limits. Patchy T2/FLAIR hyperintensity involving the periventricular and deep white matter of both cerebral hemispheres as well as the pons, consistent with chronic small vessel ischemic disease, mild to moderate in nature. No evidence for acute or subacute ischemia. Gray-white matter differentiation maintained. No acute or chronic intracranial blood products. No mass lesion, midline  shift or mass effect. No hydrocephalus or extra-axial fluid collection. Pituitary gland suprasellar region within normal limits. Vascular: Major intracranial vascular flow voids are maintained. Skull and upper cervical spine: Craniocervical junction within normal limits. Bone marrow signal intensity normal. No scalp soft tissue abnormality. Sinuses/Orbits: Globes and orbital soft tissues are within normal limits. Paranasal sinuses and mastoid air cells are largely clear. Other: None. MRA HEAD FINDINGS Anterior circulation: Both internal carotid arteries widely patent to the termini without stenosis. A1 segments widely patent. Normal anterior communicating artery complex. Both anterior cerebral arteries widely patent to their distal aspects without stenosis. No M1 stenosis or occlusion. Normal MCA bifurcations. Distal MCA branches well perfused and symmetric. Posterior circulation: Both V4 segments patent to the vertebrobasilar junction without stenosis. Both PICA origins patent and normal. Basilar widely patent to its distal aspect without stenosis. Superior cerebellar arteries patent bilaterally. Both PCAs primarily supplied via the basilar and are well  perfused to there distal aspects. Anatomic variants: None significant.  No aneurysm. IMPRESSION: MRI HEAD: 1. No acute intracranial abnormality. 2. Mild to moderate chronic microvascular ischemic disease for age. MRA HEAD: Normal intracranial MRA. Electronically Signed   By: Rise Mu M.D.   On: 04/27/2023 17:29   CT HEAD CODE STROKE WO CONTRAST  Result Date: 04/27/2023 CLINICAL DATA:  Code stroke. Neuro deficit, acute, stroke suspected. Left facial weakness. EXAM: CT HEAD WITHOUT CONTRAST TECHNIQUE: Contiguous axial images were obtained from the base of the skull through the vertex without intravenous contrast. RADIATION DOSE REDUCTION: This exam was performed according to the departmental dose-optimization program which includes automated exposure control, adjustment of the mA and/or kV according to patient size and/or use of iterative reconstruction technique. COMPARISON:  Head CT 07/19/2015. FINDINGS: Brain: No acute hemorrhage. Unchanged mild chronic small-vessel disease. Cortical gray-white differentiation is otherwise preserved. Prominence of the ventricles and sulci within expected range for age. No hydrocephalus or extra-axial collection. No mass effect or midline shift. Vascular: No hyperdense vessel or unexpected calcification. Skull: No calvarial fracture or suspicious bone lesion. Skull base is unremarkable. Sinuses/Orbits: No acute finding. Other: None. ASPECTS ([place] Stroke Program Early CT Score) - Ganglionic level infarction (caudate, lentiform nuclei, internal capsule, insula, M1-M3 cortex): 7 - Supraganglionic infarction (M4-M6 cortex): 3 Total score (0-10 with 10 being normal): 10 IMPRESSION: No acute intracranial hemorrhage or evidence of acute large vessel territory infarct. ASPECT score is 10. Code stroke imaging results were communicated on 04/27/2023 at 12:21 pm to provider Dr. Selina Cooley via secure text paging. Electronically Signed   By: Orvan Falconer M.D.   On: 04/27/2023  12:21    Procedures Procedures    Medications Ordered in ED Medications  clopidogrel (PLAVIX) tablet 75 mg (has no administration in time range)  pantoprazole (PROTONIX) EC tablet 40 mg (has no administration in time range)  albuterol (PROVENTIL) (2.5 MG/3ML) 0.083% nebulizer solution 2.5 mg (has no administration in time range)  loratadine (CLARITIN) tablet 10 mg (has no administration in time range)  mometasone-formoterol (DULERA) 100-5 MCG/ACT inhaler 2 puff (has no administration in time range)  Propylene Glycol 0.6 % SOLN 1 drop (has no administration in time range)  enoxaparin (LOVENOX) injection 40 mg (has no administration in time range)  LORazepam (ATIVAN) tablet 1 mg (1 mg Oral Given 04/27/23 1311)    ED Course/ Medical Decision Making/ A&P Clinical Course as of 04/27/23 1810  Fri Apr 27, 2023  1551 Dr Gilman Buttner [facility] to admit the patient [RP]    Clinical Course User  Index [RP] Rondel Baton, MD                                 Medical Decision Making Amount and/or Complexity of Data Reviewed Labs: ordered. Radiology: ordered.  Risk Prescription drug management. Decision regarding hospitalization.   [name] is a 87 y.o. female with comorbidities that complicate the patient evaluation including  stroke on Plavix with left upper extremity numbness, hypertension, hyperlipidemia, and COPD who presents to the emergency department with left-sided numbness and weakness of her arm.   Initial Ddx:  Stroke, TIA, hypoglycemia, palpitations, MI, anxiety  MDM/Course:  Patient presents to the emergency department with palpitations and also left-sided weakness and numbness.  Code stroke was activated on arrival.  Was found to have decree sensation to her left upper extremity and weakness of her left arm.  She underwent a noncontrast head CT that did not reveal acute abnormalities.  Left upper extremity weakness had improved upon re-evaluation.  Was decided not to  push TNK due to her mild symptoms.  Does have a contrast allergy so CTA was not obtained.  Patient underwent MRI brain and MRA brain which is waiting the results at this time.  Admitted to family medicine for further monitoring  This patient presents to the ED for concern of complaints listed in HPI, this involves an extensive number of treatment options, and is a complaint that carries with it a high risk of complications and morbidity. Disposition including potential need for admission considered.   Dispo: Admit to Floor  Additional history obtained from EMS Records reviewed Outpatient Clinic Notes The following labs were independently interpreted: Chemistry and show no acute abnormality I independently reviewed the following imaging with scope of interpretation limited to determining acute life threatening conditions related to emergency care: CT Head and agree with the radiologist interpretation with the following exceptions: none I personally reviewed and interpreted cardiac monitoring: normal sinus rhythm  I personally reviewed and interpreted the pt's EKG: see above for interpretation  I have reviewed the patients home medications and made adjustments as needed Consults: Neurology (Dr. Selina Cooley) Social Determinants of health:  Elderly  Portions of this note were generated with Dragon dictation software. Dictation errors may occur despite best attempts at proofreading.           Final Clinical Impression(s) / ED Diagnoses Final diagnoses:  TIA (transient ischemic attack)  Left sided numbness  Palpitations    Rx / DC Orders ED Discharge Orders     None         Rondel Baton, MD 04/27/23 1810

## 2023-04-27 NOTE — Consult Note (Signed)
NEUROLOGY CONSULT NOTE   Date of service: April 27, 2023 Patient Name: [name] MRN:  [id number] DOB:  1935-10-31 Chief Complaint: "CODE STROKE" Requesting Provider: Rondel Baton, MD  History of Present Illness  [name] is a 87 y.o. 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 who presents as a CODE STROKE d/t worsening left arm weakness and numbness in the setting of sudden onset of palpitations. Denies history of Afib.  On exam at bridge, patient is oriented, follows all commands, exhibits left arm weakness, left side numbness.  Patient states that she does have this at baseline from previous stroke but that is worse today.  NIH 2. CT Head negative.   LKW: 0900 Modified rankin score: 1-No significant post stroke disability and can perform usual duties with stroke symptoms IV Thrombolysis: No, mild symptoms. EVT: No, no LVO suspected  NIHSS:  1a Level of Conscious.: 0 1b LOC Questions: 0 1c LOC Commands: 0 2 Best Gaze: 0 3 Visual: 0 4 Facial Palsy: 0 5a Motor Arm - left: 1 5b Motor Arm - Right:0  6a Motor Leg - Left: 0 6b Motor Leg - Right: 0 7 Limb Ataxia: 0 8 Sensory: 1 9 Best Language: 0 10 Dysarthria: 0 11 Extinct. and Inatten.: 0 TOTAL: 2    ROS  Comprehensive ROS performed and pertinent positives documented in HPI  Past History   Past Medical History:  Diagnosis Date   Arthritis    Asthma    Blood clotting disorder (HCC)    Cataract    COPD (chronic obstructive pulmonary disease) (HCC)    Depression    Diverticulosis 10-2011   Colonoscopy   GERD (gastroesophageal reflux disease)     Hemorrhoids, internal    Hx of colonic polyp    Hyperlipemia    Hypertension    IBS (irritable bowel syndrome)    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   EGD   TIA (transient ischemic attack)     Past Surgical History:  Procedure Laterality Date   CESAREAN SECTION  1962   LAPAROSCOPIC CHOLECYSTECTOMY  1995    Family History: Family History  Problem Relation Age of Onset   Heart disease Mother    Hypertension Mother    Diabetes Mother    Asthma Father    Colon cancer Sister 67   Irritable bowel syndrome Sister    Breast cancer Other        Niece   Stomach cancer Neg Hx     Social History  reports that she quit smoking about 63 years ago. Her smoking use included cigarettes. She started smoking about 67 years ago. She has a 2.1 pack-year smoking history. She has never been exposed to tobacco smoke. She has never used smokeless tobacco. She reports that she does not drink alcohol and does not use drugs.  Allergies  Allergen Reactions   Cepacol Shortness Of Breath    Difficulty breathing   Contrast Media [Iodinated Contrast Media] Shortness Of Breath and Swelling    Pt reports "I could not breath at all and my face swelled up"    Iodine Shortness Of Breath   Lactose Intolerance (Gi) Shortness Of Breath    All dairy  Causes shortness of breath   Mushroom Extract Complex  Shortness Of Breath   Other Hives, Shortness Of Breath and Swelling    Mushrooms (All mushrooms)   Penicillins Shortness Of Breath and Swelling    DID THE REACTION INVOLVE: Swelling of the face/tongue/throat, SOB, or low BP? Yes  Sudden or severe rash/hives, skin peeling, or the inside of the mouth or nose? No Did it require medical treatment? No When did it last happen? Over 10 years ago   If all above answers are "NO", may proceed with cephalosporin use.    Sulfamethoxazole Anaphylaxis   Ciprofloxacin     Other reaction(s): Unknown   Budesonide Other (See Comments)    Headache    Medications  No  current facility-administered medications for this encounter.  Current Outpatient Medications:    albuterol (PROVENTIL) (2.5 MG/3ML) 0.083% nebulizer solution, Take 3 mLs (2.5 mg total) by nebulization 4 (four) times daily., Disp: 150 mL, Rfl: 5   albuterol (VENTOLIN HFA) 108 (90 Base) MCG/ACT inhaler, INHALE TWO PUFFS BY MOUTH INTO LUNGS EVERY SIX HOURS AS NEEDED FOR WHEEZING AND/OR SHORTNESS OF BREATH, Disp: 8.5 g, Rfl: 2   Alpha-D-Galactosidase (BEANO PO), Take 1 capsule by mouth as needed (for gas and bloating). , Disp: , Rfl:    clopidogrel (PLAVIX) 75 MG tablet, Take 75 mg by mouth daily., Disp: , Rfl: 1   cycloSPORINE (RESTASIS) 0.05 % ophthalmic emulsion, Place 1 drop into both eyes 2 (two) times daily., Disp: , Rfl:    EPINEPHrine 0.3 mg/0.3 mL IJ SOAJ injection, Inject 0.3 mg into the muscle once as needed (for a severe allergic reaction/use as directed)., Disp: , Rfl:    hydrochlorothiazide (MICROZIDE) 12.5 MG capsule, Take 12.5 mg by mouth daily. , Disp: , Rfl:    loratadine (CLARITIN) 10 MG tablet, Take 1 tablet (10 mg total) by mouth daily. (Patient taking differently: Take 10 mg by mouth daily as needed.), Disp: 30 tablet, Rfl: 5   losartan (COZAAR) 50 MG tablet, Take 50 mg by mouth daily., Disp: , Rfl:    Multiple Vitamin (MULTIVITAMIN) tablet, Take 1 tablet by mouth daily. , Disp: , Rfl:    omeprazole (PRILOSEC) 40 MG capsule, Take 40 mg by mouth daily., Disp: , Rfl:    Probiotic Product (PROBIOTIC PO), Take 1 capsule by mouth daily. , Disp: , Rfl:    Propylene Glycol (SYSTANE BALANCE) 0.6 % SOLN, Place 1 drop into both eyes 4 (four) times daily as needed (dry eyes)., Disp: , Rfl:    Respiratory Therapy Supplies (FLUTTER) DEVI, Use device 2-3 times a day to break up congestion, Disp: 1 each, Rfl: 0   Spacer/Aero-Holding Chambers (AEROCHAMBER MV) inhaler, Use as instructed, Disp: 1 each, Rfl: 0   sucralfate (CARAFATE) 1 GM/10ML suspension, Take 10 mLs (1 g total) by mouth 2 (two)  times daily. As needed, Disp: 420 mL, Rfl: 0   SYMBICORT 80-4.5 MCG/ACT inhaler, Inhale 2 puffs into the lungs in the morning and at bedtime., Disp: 1 each, Rfl: 12   traMADol (ULTRAM) 50 MG tablet, Take 1 tablet (50 mg total) by mouth every 6 (six) hours as needed., Disp: 15 tablet, Rfl: 0  Vitals   Vitals:   04/27/23 1200  Weight: 60.4 kg    Body mass index is 28.82 kg/m.  Physical Exam   Constitutional: Appears well-developed and well-nourished.  Psych: Affect appropriate to situation.  Eyes: No scleral injection.  HENT: No OP obstruction.  Head: Normocephalic.  Cardiovascular: Normal rate and regular rhythm.  Respiratory: Effort normal, non-labored breathing.  GI: Soft.  No distension. There is no tenderness.  Skin: WDI.   Neurologic Examination   Neuro: Mental Status: Patient is awake, alert, oriented to person, place, month, year, and situation. Patient is able to give a clear and coherent history. No signs of aphasia or neglect Cranial Nerves: II: Visual Fields are full. Pupils are equal, round, and reactive to light.   III,IV, VI: EOMI without ptosis or diplopia.  V: Facial sensation is symmetric to temperature VII: Facial movement is symmetric.  VIII: hearing is intact to voice X: Uvula elevates symmetrically XI: Shoulder shrug is symmetric. XII: tongue is midline without atrophy or fasciculations.  Motor: Tone is normal. Bulk is normal. 4/5 LUE with drift.  5/5 in remaining extremities with no drifts seen.  Sensory: Sensation is decreased in left arm and leg. . Cerebellar: FNF and HKS are intact bilaterally  Labs/Imaging/Neurodiagnostic studies   CBC: No results for input(s): "WBC", "NEUTROABS", "HGB", "HCT", "MCV", "PLT" in the last 168 hours.  Basic Metabolic Panel:  Lab Results  Component Value Date   NA 142 07/12/2021   K 3.9 07/12/2021   CO2 28 07/12/2021   GLUCOSE 86 07/12/2021   BUN 15 07/12/2021   CREATININE 1.03 (H) 07/12/2021    CALCIUM 9.5 07/12/2021   GFRNONAA 54 (L) 07/12/2021   GFRAA >60 09/23/2019    Lipid Panel:  Lab Results  Component Value Date   LDLCALC 118 (H) 03/13/2014    HgbA1c:  Lab Results  Component Value Date   HGBA1C 5.8 (H) 03/13/2014    Urine Drug Screen:     Component Value Date/Time   LABOPIA NONE DETECTED 10/16/2014 1025   COCAINSCRNUR NONE DETECTED 10/16/2014 1025   LABBENZ NONE DETECTED 10/16/2014 1025   AMPHETMU NONE DETECTED 10/16/2014 1025   THCU NONE DETECTED 10/16/2014 1025   LABBARB NONE DETECTED 10/16/2014 1025     Alcohol Level     Component Value Date/Time   ETH <5 10/16/2014 0854    INR  Lab Results  Component Value Date   INR 1.07 10/16/2014    APTT  Lab Results  Component Value Date   APTT 28 10/16/2014    AED levels: No results found for: "PHENYTOIN", "ZONISAMIDE", "LAMOTRIGINE", "LEVETIRACETA"    CT Head without contrast(Personally reviewed): No acute intracranial hemorrhage or evidence of acute large vessel territory infarct. ASPECT score is 10.  MR Angio head without contrast and Carotid Duplex BL(Personally reviewed): PENDING  MRI Brain(Personally reviewed): PENDING   Impression   [name] is a 87 y.o. female past medical history of hypertension, hyperlipidemia, TIA, stroke, COPD, depression, GERD who presented as a code stroke today due to worsening left arm weakness and left-sided numbness in the setting of sudden onset of palpitations.  NIH of 2 on exam.  CT head negative.  Per patient and chart review, patient had small stroke years ago and was placed on Plavix after that.  Patient denies history of atrial fibrillation.  Differential diagnosis includes stroke, recrudescence of old stroke symptoms due to complicating factor.  Recommendations   - Continue Q30 minute neurochecks until 1330 when patient is out of the window for TNK administration, then Q2h.   - MRI Brain stroke protocol - Vascular imaging - MRA without  contrast   Patient is allergic to contrast, unable to get CTA - TTE - Lipid panel - Statin - will be started if LDL>70 or otherwise medically indicated - A1C - B12, Folate, Magnesium, Thiamine, Vit D Labs - Antithrombotic -  add aspirin and continue home Plavix (has already taken today's dose) - DVT ppx -Lovenox - Smoking cessation - will counsel patient - SBP goal - <220, PRN labetalol if HR>60 and PRN Hydralazine if HR<60 - Telemetry monitoring for arrhythmia - 72h - Swallow screen - will be performed prior to PO intake - Stroke education - will be given - PT/OT/SLP - Dispo: telemetry  Stroke team will follow, beginning 11/9.  ______________________________________________________________________  Pt seen by Neuro NP/APP and later by MD. Note/plan to be edited by MD as needed.    Lynnae January, DNP, AGACNP-BC [facility] Please use AMION for contact information & EPIC for messaging.   Attending Neurohospitalist Addendum Patient seen and examined with APP/Resident. Agree with the history and physical as documented above. Agree with the plan as documented, which I helped formulate. I have edited the note above to reflect my full findings and recommendations. I have independently reviewed the chart, obtained history, review of systems and examined the patient.I have personally reviewed pertinent head/neck/spine imaging (CT/MRI). Please feel free to call with any questions.  -- Bing Neighbors, MD [facility] [phone]  If 7pm- 7am, please page neurology on call as listed in AMION.

## 2023-04-28 ENCOUNTER — Observation Stay (HOSPITAL_COMMUNITY): Payer: Medicare Other

## 2023-04-28 ENCOUNTER — Inpatient Hospital Stay (HOSPITAL_BASED_OUTPATIENT_CLINIC_OR_DEPARTMENT_OTHER): Payer: Medicare Other

## 2023-04-28 DIAGNOSIS — G459 Transient cerebral ischemic attack, unspecified: Secondary | ICD-10-CM

## 2023-04-28 DIAGNOSIS — R002 Palpitations: Secondary | ICD-10-CM | POA: Insufficient documentation

## 2023-04-28 LAB — BASIC METABOLIC PANEL
Anion gap: 7 (ref 5–15)
BUN: 12 mg/dL (ref 8–23)
CO2: 26 mmol/L (ref 22–32)
Calcium: 9.5 mg/dL (ref 8.9–10.3)
Chloride: 107 mmol/L (ref 98–111)
Creatinine, Ser: 0.99 mg/dL (ref 0.44–1.00)
GFR, Estimated: 56 mL/min — ABNORMAL LOW (ref >60)
Glucose, Bld: 84 mg/dL (ref 70–99)
Potassium: 3.8 mmol/L (ref 3.5–5.1)
Sodium: 140 mmol/L (ref 135–145)

## 2023-04-28 LAB — ECHOCARDIOGRAM COMPLETE
AR max vel: 2.05 cm2
AV Area VTI: 2.06 cm2
AV Area mean vel: 1.95 cm2
AV Mean grad: 4 mm[Hg]
AV Peak grad: 7.2 mm[Hg]
Ao pk vel: 1.34 m/s
Area-P 1/2: 2.74 cm2
S' Lateral: 2.2 cm

## 2023-04-28 LAB — CBC
HCT: 34 % — ABNORMAL LOW (ref 36.0–46.0)
Hemoglobin: 11.4 g/dL — ABNORMAL LOW (ref 12.0–15.0)
MCH: 30 pg (ref 26.0–34.0)
MCHC: 33.5 g/dL (ref 30.0–36.0)
MCV: 89.5 fL (ref 80.0–100.0)
Platelets: 242 10*3/uL (ref 150–400)
RBC: 3.8 MIL/uL — ABNORMAL LOW (ref 3.87–5.11)
RDW: 13.4 % (ref 11.5–15.5)
WBC: 3.3 10*3/uL — ABNORMAL LOW (ref 4.0–10.5)
nRBC: 0 % (ref 0.0–0.2)

## 2023-04-28 LAB — HEMOGLOBIN A1C
Hgb A1c MFr Bld: 5.3 % (ref 4.8–5.6)
Mean Plasma Glucose: 105.41 mg/dL

## 2023-04-28 LAB — LIPID PANEL
Cholesterol: 182 mg/dL (ref 0–200)
HDL: 54 mg/dL (ref >40)
LDL Cholesterol: 116 mg/dL — ABNORMAL HIGH (ref 0–99)
Total CHOL/HDL Ratio: 3.4 {ratio}
Triglycerides: 62 mg/dL (ref <150)
VLDL: 12 mg/dL (ref 0–40)

## 2023-04-28 MED ORDER — ATORVASTATIN CALCIUM 40 MG PO TABS
40.0000 mg | ORAL_TABLET | Freq: Every day | ORAL | Status: DC
  Administered 2023-04-28 – 2023-04-29 (×2): 40 mg via ORAL
  Filled 2023-04-28 (×2): qty 1

## 2023-04-28 NOTE — Progress Notes (Signed)
OT Cancellation Note  Patient Details Name: [name] MRN: [id number] DOB: December 20, 1935   Cancelled Treatment:    Reason Eval/Treat Not Completed: OT screened, no needs identified, will sign off Per PT, pt back to baseline and no formal OT eval needed at this time.  Lorre Munroe 04/28/2023, 9:50 AM

## 2023-04-28 NOTE — Progress Notes (Signed)
     Daily Progress Note Intern Pager: [phone]  Patient name: [name] Medical record number: [id number] Date of birth: [birth year] Age: 87 y.o. Gender: female  Primary Care Provider: Juluis Rainier, MD (Inactive) Consultants: neurology Code Status: DNR-I  Pt Overview and Major Events to Date:  11/8: admitted to FMTS  Assessment and Plan: [place] is an 87 year old female who presented with LUE weakness and sensory change undergoing stroke workup. Head imaging negative for acute hemorrhage or ischemia, presentation most consistent with TIA.  Pertinent PMH/PSH includes TIA, stroke, COPD, HTN, HLD Assessment & Plan TIA (transient ischemic attack) CT head and MRI brain negative for acute stroke. Risk stratification labs with Ha1c 5.3, LDL 116. Acute symptoms have resolved. Pending further imaging to investigate for cardioembolic etiology. Neuro recommendations ASA 81 and plavix 75mg  daily Lipitor 40mg  daily Pending echo  Pending carotid doppler No needs per PT/OT/SLP Palpitations Reported palpitations at time of suspected TIA.  Concerning for possible cardiac etiology of stroke symptoms.  Will monitor cardiac rhythm while inpatient.  Reassuringly, EKG and telemetry shows sinus rhythm and pt no longer having symptoms. Continuous cardiac monitoring Echo as above 30 day cardiac monitoring outpatient   Chronic and Stable Conditions: Hypertension: Holding blood pressure medications GERD: Restarted pantoprazole 80 daily (on omeprazole 40 at home) COPD: Restarted albuterol as needed and restarted LABA/LAMA Dry eyes: Restarted propylene glycol ophthalmologic solution Allergic rhinitis: Restarted loratadine 10 mg as needed  FEN/GI: regular PPx: lovenox Dispo: pending clinical improvement  Subjective:  Pt states she is no longer having numbness or weakness in her L arm. Reports some numbness in her L foot but states this is chronic. Denies palpitations and chest  pain.   Objective: Temp:  [97.9 F (36.6 C)-98.6 F (37 C)] 98.2 F (36.8 C) (11/09 0337) Pulse Rate:  [53-76] 66 (11/09 0337) Resp:  [14-22] 16 (11/09 0337) BP: (112-158)/(54-102) 117/66 (11/09 0337) SpO2:  [79 %-100 %] 97 % (11/09 0337) Weight:  [60.4 kg-61.2 kg] 61.2 kg (11/08 1233) Physical Exam: General: sitting in recliner, pleasant and conversant, in NAD Cardiovascular: RRR, normal S1/S2 Respiratory: CTAB, normal WOB on RA Abdomen: normoactive bowel sounds, soft, nontender, nondistended Neuro: CN II-XII intact, normal strength and sensation in BUE and BLE, normal speech Extremities: no edema to BLE  Laboratory: Most recent CBC Lab Results  Component Value Date   WBC 3.3 (L) 04/28/2023   HGB 11.4 (L) 04/28/2023   HCT 34.0 (L) 04/28/2023   MCV 89.5 04/28/2023   PLT 242 04/28/2023   Most recent BMP    Latest Ref Rng & Units 04/28/2023    4:35 AM  BMP  Glucose 70 - 99 mg/dL 84   BUN 8 - 23 mg/dL 12   Creatinine 5.95 - 1.00 mg/dL 6.38   Sodium 756 - 433 mmol/L 140   Potassium 3.5 - 5.1 mmol/L 3.8   Chloride 98 - 111 mmol/L 107   CO2 22 - 32 mmol/L 26   Calcium 8.9 - 10.3 mg/dL 9.5      Haidee Stogsdill, Tacey Ruiz, MD 04/28/2023, 7:38 AM  PGY-1, [facility] FPTS Intern pager: [phone], text pages welcome Secure chat group [facility] Teaching Service

## 2023-04-28 NOTE — Evaluation (Signed)
Clinical/Bedside Swallow Evaluation Patient Details  Name: TYRIANA GREMS MRN: [id number] Date of Birth: February 13, 1936  Today's Date: 04/28/2023 Time: SLP Start Time (ACUTE ONLY): 0739 SLP Stop Time (ACUTE ONLY): 0755 SLP Time Calculation (min) (ACUTE ONLY): 16 min  Past Medical History:  Past Medical History:  Diagnosis Date   Arthritis    Asthma    Blood clotting disorder (HCC)    Cataract    COPD (chronic obstructive pulmonary disease) (HCC)    Depression    Diverticulosis 10-2011   Colonoscopy   GERD (gastroesophageal reflux disease)    Hemorrhoids, internal    Hx of colonic polyp    Hyperlipemia    Hypertension    IBS (irritable bowel syndrome)    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   EGD   TIA (transient ischemic attack)    Past Surgical History:  Past Surgical History:  Procedure Laterality Date   CESAREAN SECTION  1962   LAPAROSCOPIC CHOLECYSTECTOMY  1995   HPI:  [name] is a 87 y.o. female with past history of TIA, stroke, COPD, hypertension, hyperlipidemia presenting with sudden left arm weakness and left-sided sensation changes. Differential for this patient's presentation of this includes stroke, TIA, recrudescence of previous stroke.  Suspect patient had TIA.  CT head and MRI reassuringly do not show evidence of acute stroke.  Clinical history and labs not consistent with trauma or injury upper extremity, cervical stenosis or, brachial injury to explain symptoms.    Assessment / Plan / Recommendation  Clinical Impression   Pt presents with a grossly functional oropharyngeal swallow function per clinical swallow assessment completed today. Oral prep and transit prompt with complete oral clearance. No anterior loss observed. Pharyngeal swallow initiation appeared timely with laryngeal elevation noted. Pt did report brief globus sensation with thin liquids and cracker trial. She reported hx of globus sensation with liquids and solids, which she manages by  "pushing it down" with additional swallows. She underwent and EGD on 11/16/11 with esophageal dilation for stricture at GE junction.  She reported a hx of reflux, which is managed with medications. Pt denied hx of pneumonia over the past year.  No overt or subtle s/s of aspiration noted during clinical swallow assessment today.  Recommend initiation of a regular diet and thin liquids as tolerated. PO meds as tolerated. No acute SLP needs identified at this time. SLP will sign off. Recommend follow up with outpatient GI given pt's report of globus sensation and hx of esophageal stricture.   SLP Visit Diagnosis:  (r/o dysphagia; suspected esophageal dysphagia)    Aspiration Risk  Mild aspiration risk    Diet Recommendation Regular;Thin liquid    Liquid Administration via: Cup;Straw Medication Administration: Whole meds with puree (per pt preference) Supervision: Patient able to self feed Compensations: Small sips/bites;Slow rate;Follow solids with liquid Postural Changes: Seated upright at 90 degrees;Remain upright for at least 30 minutes after po intake    Other  Recommendations Recommended Consults: Consider GI evaluation Oral Care Recommendations: Oral care BID    Recommendations for follow up therapy are one component of a multi-disciplinary discharge planning process, led by the attending physician.  Recommendations may be updated based on patient status, additional functional criteria and insurance authorization.  Follow up Recommendations No SLP follow up      Assistance Recommended at Discharge  Defer to PT/OT recommendations  Functional Status Assessment Patient has not had a recent decline in their functional status  Frequency and Duration   N/a - d/c acute  SLP         Prognosis Prognosis for improved oropharyngeal function: Good      Swallow Study   General Date of Onset: 04/27/23 HPI: [name] is a 87 y.o. female with past history of TIA, stroke, COPD,  hypertension, hyperlipidemia presenting with sudden left arm weakness and left-sided sensation changes. Differential for this patient's presentation of this includes stroke, TIA, recrudescence of previous stroke.  Suspect patient had TIA.  CT head and MRI reassuringly do not show evidence of acute stroke.  Clinical history and labs not consistent with trauma or injury upper extremity, cervical stenosis or, brachial injury to explain symptoms. Type of Study: Bedside Swallow Evaluation Previous Swallow Assessment: none per chart; pt reported hx of esophageal dilation Diet Prior to this Study: NPO Temperature Spikes Noted: No Respiratory Status: Room air History of Recent Intubation: No Behavior/Cognition: Alert;Cooperative;Pleasant mood Oral Cavity Assessment: Within Functional Limits Oral Cavity - Dentition: Adequate natural dentition Vision: Functional for self-feeding Self-Feeding Abilities: Able to feed self Patient Positioning: Upright in bed Baseline Vocal Quality: Normal Volitional Cough: Strong Volitional Swallow: Able to elicit    Oral/Motor/Sensory Function Overall Oral Motor/Sensory Function: Within functional limits   Ice Chips Ice chips: Not tested   Thin Liquid Thin Liquid: Within functional limits Presentation: Cup;Self Fed;Straw    Nectar Thick Nectar Thick Liquid: Not tested   Honey Thick Honey Thick Liquid: Not tested   Puree Puree: Within functional limits Presentation: Self Fed   Solid     Solid: Within functional limits Presentation: Self Fed      Ellery Plunk 04/28/2023,8:05 AM

## 2023-04-28 NOTE — Plan of Care (Signed)

## 2023-04-28 NOTE — Care Management Obs Status (Signed)
MEDICARE OBSERVATION STATUS NOTIFICATION   Patient Details  Name: [name] MRN: [id number] Date of Birth: [birth year]   Medicare Observation Status Notification Given:  Yes    Lawerance Sabal, RN 04/28/2023, 3:39 PM

## 2023-04-28 NOTE — Progress Notes (Signed)
STROKE TEAM PROGRESS NOTE   SUBJECTIVE (INTERVAL HISTORY) No family is at the bedside.  Overall her condition is completely resolved.  Patient still has subjective feeling of left foot numbness but no light touch difference on exam.  Patient stated that yesterday she had heart palpitation first and then started have left arm and leg numbness weakness.  In ER, she started have headache.  Currently symptom all much improved/resolved.   OBJECTIVE Temp:  [97.9 F (36.6 C)-98.6 F (37 C)] 97.9 F (36.6 C) (11/09 0824) Pulse Rate:  [53-76] 72 (11/09 0824) Cardiac Rhythm: Normal sinus rhythm (11/09 0800) Resp:  [14-22] 17 (11/09 0824) BP: (112-158)/(54-102) 121/67 (11/09 0824) SpO2:  [79 %-100 %] 99 % (11/09 0824) Weight:  [60.4 kg-61.2 kg] 61.2 kg (11/08 1233)  Recent Labs  Lab 04/27/23 1207  GLUCAP 117*   Recent Labs  Lab 04/27/23 1208 04/27/23 1212 04/28/23 0435  NA 140 140 140  K 3.9 3.7 3.8  CL 106 103 107  CO2 24  --  26  GLUCOSE 114* 113* 84  BUN 16 19 12   CREATININE 1.19* 1.20* 0.99  CALCIUM 9.4  --  9.5   Recent Labs  Lab 04/27/23 1208  AST 28  ALT 22  ALKPHOS 51  BILITOT 1.4*  PROT 6.8  ALBUMIN 3.7   Recent Labs  Lab 04/27/23 1208 04/27/23 1212 04/28/23 0435  WBC 3.9*  --  3.3*  NEUTROABS 1.8  --   --   HGB 12.0 12.6 11.4*  HCT 35.5* 37.0 34.0*  MCV 91.7  --  89.5  PLT 246  --  242   No results for input(s): "CKTOTAL", "CKMB", "CKMBINDEX", "TROPONINI" in the last 168 hours. Recent Labs    04/27/23 1208  LABPROT 14.0  INR 1.1   Recent Labs    04/27/23 1524  COLORURINE STRAW*  LABSPEC 1.005  PHURINE 8.0  GLUCOSEU NEGATIVE  HGBUR NEGATIVE  BILIRUBINUR NEGATIVE  KETONESUR NEGATIVE  PROTEINUR NEGATIVE  NITRITE NEGATIVE  LEUKOCYTESUR SMALL*       Component Value Date/Time   CHOL 182 04/28/2023 0435   TRIG 62 04/28/2023 0435   HDL 54 04/28/2023 0435   CHOLHDL 3.4 04/28/2023 0435   VLDL 12 04/28/2023 0435   LDLCALC 116 (H) 04/28/2023  0435   Lab Results  Component Value Date   HGBA1C 5.3 04/28/2023      Component Value Date/Time   LABOPIA NONE DETECTED 04/27/2023 1524   COCAINSCRNUR NONE DETECTED 04/27/2023 1524   LABBENZ NONE DETECTED 04/27/2023 1524   AMPHETMU NONE DETECTED 04/27/2023 1524   THCU NONE DETECTED 04/27/2023 1524   LABBARB NONE DETECTED 04/27/2023 1524    Recent Labs  Lab 04/27/23 1208  ETH <10    I have personally reviewed the radiological images below and agree with the radiology interpretations.  MR BRAIN WO CONTRAST  Result Date: 04/27/2023 CLINICAL DATA:  Initial evaluation for acute neuro deficit, stroke suspected. EXAM: MRI HEAD WITHOUT CONTRAST MRA HEAD WITHOUT CONTRAST TECHNIQUE: Multiplanar, multi-echo pulse sequences of the brain and surrounding structures were acquired without intravenous contrast. Angiographic images of the Circle of Willis were acquired using MRA technique without intravenous contrast. COMPARISON:  CT from earlier the same day. FINDINGS: MRI HEAD FINDINGS Brain: Cerebral volume within normal limits. Patchy T2/FLAIR hyperintensity involving the periventricular and deep white matter of both cerebral hemispheres as well as the pons, consistent with chronic small vessel ischemic disease, mild to moderate in nature. No evidence for acute or subacute ischemia. Gray-white  matter differentiation maintained. No acute or chronic intracranial blood products. No mass lesion, midline shift or mass effect. No hydrocephalus or extra-axial fluid collection. Pituitary gland suprasellar region within normal limits. Vascular: Major intracranial vascular flow voids are maintained. Skull and upper cervical spine: Craniocervical junction within normal limits. Bone marrow signal intensity normal. No scalp soft tissue abnormality. Sinuses/Orbits: Globes and orbital soft tissues are within normal limits. Paranasal sinuses and mastoid air cells are largely clear. Other: None. MRA HEAD FINDINGS Anterior  circulation: Both internal carotid arteries widely patent to the termini without stenosis. A1 segments widely patent. Normal anterior communicating artery complex. Both anterior cerebral arteries widely patent to their distal aspects without stenosis. No M1 stenosis or occlusion. Normal MCA bifurcations. Distal MCA branches well perfused and symmetric. Posterior circulation: Both V4 segments patent to the vertebrobasilar junction without stenosis. Both PICA origins patent and normal. Basilar widely patent to its distal aspect without stenosis. Superior cerebellar arteries patent bilaterally. Both PCAs primarily supplied via the basilar and are well perfused to there distal aspects. Anatomic variants: None significant.  No aneurysm. IMPRESSION: MRI HEAD: 1. No acute intracranial abnormality. 2. Mild to moderate chronic microvascular ischemic disease for age. MRA HEAD: Normal intracranial MRA. Electronically Signed   By: Rise Mu M.D.   On: 04/27/2023 17:29   MR ANGIO HEAD WO CONTRAST  Result Date: 04/27/2023 CLINICAL DATA:  Initial evaluation for acute neuro deficit, stroke suspected. EXAM: MRI HEAD WITHOUT CONTRAST MRA HEAD WITHOUT CONTRAST TECHNIQUE: Multiplanar, multi-echo pulse sequences of the brain and surrounding structures were acquired without intravenous contrast. Angiographic images of the Circle of Willis were acquired using MRA technique without intravenous contrast. COMPARISON:  CT from earlier the same day. FINDINGS: MRI HEAD FINDINGS Brain: Cerebral volume within normal limits. Patchy T2/FLAIR hyperintensity involving the periventricular and deep white matter of both cerebral hemispheres as well as the pons, consistent with chronic small vessel ischemic disease, mild to moderate in nature. No evidence for acute or subacute ischemia. Gray-white matter differentiation maintained. No acute or chronic intracranial blood products. No mass lesion, midline shift or mass effect. No  hydrocephalus or extra-axial fluid collection. Pituitary gland suprasellar region within normal limits. Vascular: Major intracranial vascular flow voids are maintained. Skull and upper cervical spine: Craniocervical junction within normal limits. Bone marrow signal intensity normal. No scalp soft tissue abnormality. Sinuses/Orbits: Globes and orbital soft tissues are within normal limits. Paranasal sinuses and mastoid air cells are largely clear. Other: None. MRA HEAD FINDINGS Anterior circulation: Both internal carotid arteries widely patent to the termini without stenosis. A1 segments widely patent. Normal anterior communicating artery complex. Both anterior cerebral arteries widely patent to their distal aspects without stenosis. No M1 stenosis or occlusion. Normal MCA bifurcations. Distal MCA branches well perfused and symmetric. Posterior circulation: Both V4 segments patent to the vertebrobasilar junction without stenosis. Both PICA origins patent and normal. Basilar widely patent to its distal aspect without stenosis. Superior cerebellar arteries patent bilaterally. Both PCAs primarily supplied via the basilar and are well perfused to there distal aspects. Anatomic variants: None significant.  No aneurysm. IMPRESSION: MRI HEAD: 1. No acute intracranial abnormality. 2. Mild to moderate chronic microvascular ischemic disease for age. MRA HEAD: Normal intracranial MRA. Electronically Signed   By: Rise Mu M.D.   On: 04/27/2023 17:29   CT HEAD CODE STROKE WO CONTRAST  Result Date: 04/27/2023 CLINICAL DATA:  Code stroke. Neuro deficit, acute, stroke suspected. Left facial weakness. EXAM: CT HEAD WITHOUT CONTRAST TECHNIQUE: Contiguous axial  images were obtained from the base of the skull through the vertex without intravenous contrast. RADIATION DOSE REDUCTION: This exam was performed according to the departmental dose-optimization program which includes automated exposure control, adjustment of the  mA and/or kV according to patient size and/or use of iterative reconstruction technique. COMPARISON:  Head CT 07/19/2015. FINDINGS: Brain: No acute hemorrhage. Unchanged mild chronic small-vessel disease. Cortical gray-white differentiation is otherwise preserved. Prominence of the ventricles and sulci within expected range for age. No hydrocephalus or extra-axial collection. No mass effect or midline shift. Vascular: No hyperdense vessel or unexpected calcification. Skull: No calvarial fracture or suspicious bone lesion. Skull base is unremarkable. Sinuses/Orbits: No acute finding. Other: None. ASPECTS ([place] Stroke Program Early CT Score) - Ganglionic level infarction (caudate, lentiform nuclei, internal capsule, insula, M1-M3 cortex): 7 - Supraganglionic infarction (M4-M6 cortex): 3 Total score (0-10 with 10 being normal): 10 IMPRESSION: No acute intracranial hemorrhage or evidence of acute large vessel territory infarct. ASPECT score is 10. Code stroke imaging results were communicated on 04/27/2023 at 12:21 pm to provider Dr. Selina Cooley via secure text paging. Electronically Signed   By: Orvan Falconer M.D.   On: 04/27/2023 12:21     PHYSICAL EXAM  Temp:  [97.9 F (36.6 C)-98.6 F (37 C)] 97.9 F (36.6 C) (11/09 0824) Pulse Rate:  [53-76] 72 (11/09 0824) Resp:  [14-22] 17 (11/09 0824) BP: (112-158)/(54-102) 121/67 (11/09 0824) SpO2:  [79 %-100 %] 99 % (11/09 0824) Weight:  [60.4 kg-61.2 kg] 61.2 kg (11/08 1233)  General - Well nourished, well developed, in no apparent distress.  Ophthalmologic - fundi not visualized due to noncooperation.  Cardiovascular - Regular rhythm and rate.  Mental Status -  Level of arousal and orientation to time, place, and person were intact. Language including expression, naming, repetition, comprehension was assessed and found intact. Attention span and concentration were normal. Fund of Knowledge was assessed and was intact.  Cranial Nerves II - XII - II -  Visual field intact OU. III, IV, VI - Extraocular movements intact. V - Facial sensation intact bilaterally. VII - Facial movement intact bilaterally. VIII - Hearing & vestibular intact bilaterally. X - Palate elevates symmetrically. XI - Chin turning & shoulder shrug intact bilaterally. XII - Tongue protrusion intact.  Motor Strength - The patient's strength was normal in all extremities and pronator drift was absent.  Bulk was normal and fasciculations were absent.   Motor Tone - Muscle tone was assessed at the neck and appendages and was normal.  Reflexes - The patient's reflexes were symmetrical in all extremities and she had no pathological reflexes.  Sensory - Light touch, temperature/pinprick were assessed and were symmetrical.  However, patient has left foot subjective feeling of numbness.  Coordination - The patient had normal movements in the hands and feet with no ataxia or dysmetria.  Tremor was absent.  Gait and Station - deferred.   ASSESSMENT/PLAN Ms. RASHELE NIBARGER is a 87 y.o. female with history of hypertension, hyperlipidemia, IBS, esophageal stricture,?  TIA in 2015 admitted for palpitation followed by left-sided arm and leg numbness and weakness. No tPA given due to mild symptoms.    TIA:  right brain TIA infarct, etiology unclear, concerning for cardioembolic source with heart palpitation.  DDx including complicated migraine CT no acute abnormality MRI no infarct MRA unremarkable Carotid Doppler pending 2D Echo pending LDL 116 HgbA1c 5.3 UDS negative SCDs for VTE prophylaxis clopidogrel 75 mg daily prior to admission, now on aspirin 81 mg daily  and clopidogrel 75 mg daily DAPT for 3 weeks and then Plavix alone. Recommend 30-day cardiac event monitoring as outpatient to rule out A-fib Patient counseled to be compliant with her antithrombotic medications Ongoing aggressive stroke risk factor management Therapy recommendations: None Disposition:  Pending  History of ? TIA 02/2014 admitted for episode of confusion.  MRI and MRA unremarkable.  EEG no seizure.  LDL 118.  Discharged on aspirin 81 and Zocor.  Heart palpitation New onset of heart palpitation yesterday lasting 30 minutes EKG shows sinus rhythm Recommend 30-day cardiac event monitoring as outpatient to rule out A-fib If negative, may consider loop recorder later  Hypertension Stable Long term BP goal normotensive  Hyperlipidemia Home meds: Lipitor 20 LDL 113, goal < 70 Now on 40 Continue statin at discharge  Other Stroke Risk Factors Advanced age  Other Active Problems Intermittent headache,?  Migraine  Hospital day # 0    Marvel Plan, MD PhD Stroke Neurology 04/28/2023 11:14 AM    To contact Stroke Continuity provider, please refer to [email or web address]. After hours, contact General Neurology

## 2023-04-28 NOTE — Assessment & Plan Note (Addendum)
Reported palpitations at time of suspected TIA.  Concerning for possible cardiac etiology of stroke symptoms.  Will monitor cardiac rhythm while inpatient.  Reassuringly, EKG and telemetry shows sinus rhythm and pt no longer having symptoms. Continuous cardiac monitoring Echo as above 30 day cardiac monitoring outpatient

## 2023-04-28 NOTE — Evaluation (Signed)
Physical Therapy Evaluation and Discharge Patient Details Name: [name] MRN: [id number] DOB: [birth year] Today's Date: 04/28/2023  History of Present Illness  87 y.o. female presenting 04/27/23 with sudden left arm weakness and left-sided sensation changes. CT head, MRI brain no acute changes; symptoms resolved and suspect TIA  PMH- TIA, stroke, COPD, hypertension, hyperlipidemia  Clinical Impression   Patient evaluated by Physical Therapy with no further acute PT needs identified. Patient ambulated independently x 200 ft and up/down steps with rail modified independent (as she does at home). No imbalance noted.  PT is signing off. Thank you for this referral.         If plan is discharge home, recommend the following:     Can travel by private vehicle        Equipment Recommendations None recommended by PT  Recommendations for Other Services       Functional Status Assessment Patient has not had a recent decline in their functional status     Precautions / Restrictions Precautions Precautions: None      Mobility  Bed Mobility Overal bed mobility: Independent                  Transfers Overall transfer level: Independent                      Ambulation/Gait Ambulation/Gait assistance: Independent Gait Distance (Feet): 200 Feet Assistive device: None Gait Pattern/deviations: WFL(Within Functional Limits)   Gait velocity interpretation: >2.62 ft/sec, indicative of community ambulatory      Stairs Stairs: Yes Stairs assistance: Modified independent (Device/Increase time) Stair Management: Two rails, Forwards Number of Stairs: 2 General stair comments: no issues  Wheelchair Mobility     Tilt Bed    Modified Rankin (Stroke Patients Only) Modified Rankin (Stroke Patients Only) Pre-Morbid Rankin Score: No symptoms Modified Rankin: No significant disability     Balance Overall balance assessment: Independent                                            Pertinent Vitals/Pain      Home Living Family/patient expects to be discharged to:: Private residence Living Arrangements: Children (disabled son) Available Help at Discharge: Family;Available PRN/intermittently Type of Home: House (townhome) Home Access: Stairs to enter Entrance Stairs-Rails: Right;Left;Can reach both Entrance Stairs-Number of Steps: 4   Home Layout: Two level;Able to live on main level with bedroom/bathroom Home Equipment: None Additional Comments: 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; he goes to day program    Prior Function Prior Level of Function : Independent/Modified Independent;Driving             Mobility Comments: no falls ADLs Comments: calls in her groceries and drives to pick up; son helps carry them in     Extremity/Trunk Assessment   Upper Extremity Assessment Upper Extremity Assessment: Defer to OT evaluation    Lower Extremity Assessment Lower Extremity Assessment: LLE deficits/detail LLE Deficits / Details: strength 5/5; reports numbness lower leg (anterior and posterior) LLE Sensation: decreased light touch    Cervical / Trunk Assessment Cervical / Trunk Assessment: Normal  Communication   Communication Communication: No apparent difficulties  Cognition Arousal: Alert Behavior During Therapy: WFL for tasks assessed/performed Overall Cognitive Status: Within Functional Limits for tasks assessed  General Comments      Exercises     Assessment/Plan    PT Assessment Patient does not need any further PT services  PT Problem List         PT Treatment Interventions      PT Goals (Current goals can be found in the Care Plan section)  Acute Rehab PT Goals Patient Stated Goal: go home today PT Goal Formulation: All assessment and education complete, DC therapy    Frequency       Co-evaluation                AM-PAC PT "6 Clicks" Mobility  Outcome Measure Help needed turning from your back to your side while in a flat bed without using bedrails?: None Help needed moving from lying on your back to sitting on the side of a flat bed without using bedrails?: None Help needed moving to and from a bed to a chair (including a wheelchair)?: None Help needed standing up from a chair using your arms (e.g., wheelchair or bedside chair)?: None Help needed to walk in hospital room?: None Help needed climbing 3-5 steps with a railing? : None 6 Click Score: 24    End of Session Equipment Utilized During Treatment: Gait belt Activity Tolerance: Patient tolerated treatment well Patient left: in chair;with call bell/phone within reach;with chair alarm set Nurse Communication: Mobility status;Other (comment) (no PT or DME needs) PT Visit Diagnosis: Difficulty in walking, not elsewhere classified (R26.2)    Time: 5284-1324 PT Time Calculation (min) (ACUTE ONLY): 20 min   Charges:   PT Evaluation $PT Eval Low Complexity: 1 Low   PT General Charges $$ ACUTE PT VISIT: 1 Visit          Jerolyn Center, PT Acute Rehabilitation Services  Office [phone]   Zena Amos 04/28/2023, 8:47 AM

## 2023-04-28 NOTE — Assessment & Plan Note (Signed)
Reported palpitations at time of suspected TIA.  Concerning for possible cardiac etiology of stroke symptoms.  Will monitor cardiac rhythm while inpatient.  Reassuringly, EKG shows sinus rhythm. Telemetry monitoring TTE tomorrow

## 2023-04-28 NOTE — Progress Notes (Signed)
VASCULAR LAB    Carotid duplex has been performed.  See CV proc for preliminary results.   Omie Ferger, RVT 04/28/2023, 2:51 PM

## 2023-04-28 NOTE — Hospital Course (Addendum)
[name] is a 87 y.o.female with a history of TIA, stroke, COPD, HTN, HLD who was admitted to the [facility] Medicine Teaching Service at [facility] for left arm weakness and sensory change. Her hospital course is detailed below:  TIA Presented with acute onset of L arm numbness, weakness, and palpitations. CT head was negative for acute stroke. Neurology was consulted and advised not giving TNK due to mild presentation. MRI and MRA were negative for acute infarct. Ha1c was WNL and LDL 116. Echo and carotid doppler showed ***. Started DAPT and Lipitor. Antihypertensives were held.  Palpitations Occurred alongside L arm symptoms as above. EKG was negative for acute changes and there were no abnormal rhythms on continuous telemetry. Echo showed *** as above. Pt had no further cardiac symptoms during hospital stay. Neurology recommended outpatient cardiac monitor to rule out Afib.    Other chronic conditions were medically managed with home medications and formulary alternatives as necessary (GERD, COPD, dry eye, allergic rhinitis)  PCP Follow-up Recommendations: 30 day cardiac monitor to rule out Afib, consider loop recorder if negative Ensure compliance with ASA 81 daily and Plavix 75mg  daily for 3 weeks, then Plavix alone

## 2023-04-28 NOTE — Plan of Care (Signed)
FMTS Brief Progress Note  S: Patient seen at bedside for nighttime rounds.  O: BP (!) 112/54 (BP Location: Left Arm)   Pulse (!) 55   Temp 98 F (36.7 C) (Oral)   Resp 16   Ht 4\' 9"  (1.448 m)   Wt 61.2 kg   SpO2 96%   BMI 29.21 kg/m    Gen: NAD, asleep comfortably in bed Resp: symmetric chest rise, no iWOB on RA  A/P:  TIA MRI/CT head negative. Bps have been reassuring. -Await neurology recommendations  Plan per H&P  Levin Erp, MD 04/28/2023, 12:58 AM PGY-3, [facility] Night Resident  Please page [phone] with questions.

## 2023-04-28 NOTE — Discharge Summary (Shared)
Family Medicine Teaching [facility] Discharge Summary  Patient name: [name] Medical record number: [id number] Date of birth: 1935-11-21 Age: 87 y.o. Gender: female Date of Admission: 04/27/2023  Date of Discharge: 04/29/23 Admitting Physician: Meryl Dare, MD  Primary Care Provider: Juluis Rainier, MD (Inactive) Consultants: neurology  Indication for Hospitalization: TIA  Discharge Diagnoses/Problem List:  Principal Problem for Admission: TIA Other Problems addressed during stay:  Principal Problem:   TIA (transient ischemic attack) Active Problems:   Palpitations    Brief Hospital Course:  [name] is a 87 y.o.female with a history of TIA, stroke, COPD, HTN, HLD who was admitted to the [facility] Medicine Teaching Service at [facility] for left arm weakness and sensory change. Her hospital course is detailed below:  TIA Presented with acute onset of L arm numbness, weakness, and palpitations. CT head was negative for acute stroke. Neurology was consulted and advised not giving TNK due to mild presentation. MRI and MRA were negative for acute infarct. A1c was 5.3 and LDL 116. ECHO negative for cardiac cause of stroke. Carotid doppler showed <39% stenosis bilaterally. Started DAPT for 21 days and then Plavix alone indefinitely.  Also increased Lipitor to 40 for elevated LDL.   Palpitations Occurred alongside L arm symptoms as above. EKG was negative for acute changes and there were no abnormal rhythms on continuous telemetry. Echo showed EF 65% but grade 4 descending plaque involving ascending aorta and small pericardial effusion. Pt had no further cardiac symptoms during hospital stay. Neurology recommended outpatient cardiac monitor to rule out Afib.   Other chronic conditions were medically managed with home medications and formulary alternatives as necessary (GERD, Asthma, dry eye, allergic rhinitis)  PCP Follow-up Recommendations: Needs Zio patch  ordered. 30 day cardiac monitor to rule out arrhythmic cause of stroke. Consider loop recorder if negative.. Ensure adherence with ASA 81 daily and Plavix 75mg  daily for 3 weeks, then Plavix alone.  Restart blood pressure medications as indicated, held at discharge due to normal blood pressure/hypotensive while inpatient. Monitor echo changes long-term.  Shared decision making for interventions. Has neurology follow-up.   Disposition: home  Discharge Condition: stable  Discharge Exam:  Vitals:   04/29/23 0803 04/29/23 0822  BP:  133/67  Pulse:  68  Resp:    Temp:  98.3 F (36.8 C)  SpO2: 99% 97%   Physical exam: General: Calm and cooperative sitting in bed.  Listening to her church service and walk in the room.  Not in acute distress. Cardiac: Normal rate and rhythm.  Normal heart sounds. Pulmonary: Lungs clear to auscultation bilaterally.  Normal effort. Neuro: NIHSS 0.  EOMI. visual fields intact.  No facial weakness or sensation changes.  Strength 5/5 in all extremities.  Finger-nose and heel-to-shin intact bilaterally.  Sensation intact and equal bilaterally.  Speech without dysarthria or aphasia as evident by conversation.  Significant Procedures: None  Significant Labs and Imaging:  Recent Labs  Lab 04/27/23 1208 04/27/23 1212 04/28/23 0435  WBC 3.9*  --  3.3*  HGB 12.0 12.6 11.4*  HCT 35.5* 37.0 34.0*  PLT 246  --  242   Recent Labs  Lab 04/27/23 1208 04/27/23 1212 04/28/23 0435  NA 140 140 140  K 3.9 3.7 3.8  CL 106 103 107  CO2 24  --  26  GLUCOSE 114* 113* 84  BUN 16 19 12   CREATININE 1.19* 1.20* 0.99  CALCIUM 9.4  --  9.5  ALKPHOS 51  --   --  AST 28  --   --   ALT 22  --   --   ALBUMIN 3.7  --   --     MRI HEAD: 1. No acute intracranial abnormality. 2. Mild to moderate chronic microvascular ischemic disease for age.   MRA HEAD: Normal intracranial MRA.  Transthoracic echocardiogram: 1. Left ventricular ejection fraction, by estimation,  is 60 to 65% . The left ventricle has normal function. The left ventricle has no regional wall motion abnormalities. Left ventricular diastolic parameters were normal. 2. Right ventricular systolic function is normal. The right ventricular size is normal. Tricuspid regurgitation signal is inadequate for assessing PA pressure. 3. A small pericardial effusion is present. The pericardial effusion is anterior to the right ventricle. There is no evidence of cardiac tamponade. 4. The mitral valve is normal in structure. Trivial mitral valve regurgitation. No evidence of mitral stenosis. 5. The aortic valve is tricuspid. There is mild thickening of the aortic valve. Aortic valve regurgitation is not visualized. Aortic valve sclerosis is present, with no evidence of aortic valve stenosis. 6. There is Severe ( Grade IV) protruding plaque involving the descending aorta. 7. The inferior vena cava is dilated in size with > 50% respiratory variability, suggesting right atrial pressure of 8 mmHg.  Vascular ultrasound carotid duplex bilateral: Right Carotid: Velocities in the right ICA are consistent with a 1-39%  stenosis.  Left Carotid: Velocities in the left ICA are consistent with a 1-39%  stenosis.  Vertebrals: Bilateral vertebral arteries demonstrate antegrade flow.  Subclavians: Normal flow hemodynamics were seen in bilateral subclavian               arteries.    Results/Tests Pending at Time of Discharge: Vascular ultrasound carotid pending final read by vascular but was reviewed by stroke team who is signing off.   Discharge Medications:  Allergies as of 04/29/2023       Reactions   Cepacol Shortness Of Breath   Difficulty breathing   Contrast Media [iodinated Contrast Media] Shortness Of Breath, Swelling   Pt reports "I could not breath at all and my face swelled up"    Iodine Shortness Of Breath   Lactose Intolerance (gi) Shortness Of Breath   All dairy  Causes shortness of breath   Mushroom  Extract Complex Shortness Of Breath   Other Hives, Shortness Of Breath, Swelling   Mushrooms (All mushrooms)   Penicillins Shortness Of Breath, Swelling   DID THE REACTION INVOLVE: Swelling of the face/tongue/throat, SOB, or low BP? Yes  Sudden or severe rash/hives, skin peeling, or the inside of the mouth or nose? No Did it require medical treatment? No When did it last happen? Over 10 years ago   If all above answers are "NO", may proceed with cephalosporin use.   Sulfamethoxazole Anaphylaxis   Ciprofloxacin    Other reaction(s): Unknown   Budesonide Other (See Comments)   Headache        Medication List     STOP taking these medications    hydrochlorothiazide 12.5 MG capsule Commonly known as: MICROZIDE   losartan 50 MG tablet Commonly known as: COZAAR   omeprazole 40 MG capsule Commonly known as: PRILOSEC       TAKE these medications    AeroChamber MV inhaler Use as instructed   albuterol (2.5 MG/3ML) 0.083% nebulizer solution Commonly known as: PROVENTIL Take 3 mLs (2.5 mg total) by nebulization 4 (four) times daily.   albuterol 108 (90 Base) MCG/ACT inhaler Commonly known as:  VENTOLIN HFA INHALE TWO PUFFS BY MOUTH INTO LUNGS EVERY SIX HOURS AS NEEDED FOR WHEEZING AND/OR SHORTNESS OF BREATH   aspirin EC 81 MG tablet Take 1 tablet (81 mg total) by mouth daily for 20 days. Swallow whole.   atorvastatin 40 MG tablet Commonly known as: LIPITOR Take 1 tablet (40 mg total) by mouth daily. What changed:  medication strength how much to take   BEANO PO Take 1 capsule by mouth as needed (for gas and bloating).   cholecalciferol 25 MCG (1000 UNIT) tablet Commonly known as: VITAMIN D3 Take 1,000 Units by mouth daily.   clopidogrel 75 MG tablet Commonly known as: PLAVIX Take 1 tablet (75 mg total) by mouth daily. Start taking on: April 30, 2023   cycloSPORINE 0.05 % ophthalmic emulsion Commonly known as: RESTASIS Place 1 drop into both eyes 2 (two)  times daily.   EPINEPHrine 0.3 mg/0.3 mL Soaj injection Commonly known as: EPI-PEN Inject 0.3 mg into the muscle once as needed (for a severe allergic reaction/use as directed).   Flutter Devi Use device 2-3 times a day to break up congestion   loratadine 10 MG tablet Commonly known as: CLARITIN Take 1 tablet (10 mg total) by mouth daily. What changed:  when to take this reasons to take this   multivitamin tablet Take 1 tablet by mouth daily.   PROBIOTIC PO Take 1 capsule by mouth daily.   Symbicort 80-4.5 MCG/ACT inhaler Generic drug: budesonide-formoterol Inhale 2 puffs into the lungs in the morning and at bedtime.   Systane Balance 0.6 % Soln Generic drug: Propylene Glycol Place 1 drop into both eyes 4 (four) times daily as needed (dry eyes).        Discharge Instructions: Please refer to Patient Instructions section of EMR for full details.  Patient was counseled important signs and symptoms that should prompt return to medical care, changes in medications, dietary instructions, activity restrictions, and follow up appointments.   Follow-Up Appointments: Follow-up primary care within 2 weeks.  Follow-up Information     [facility]. Schedule an appointment as soon as possible for a visit in 1 month(s).   Specialty: Neurology Why: stroke clinic Contact information: [address] [place] [address] [phone]                Meryl Dare, MD 04/29/2023, 11:57 AM PGY-1, [facility]  I have verified that the resident's  findings are accurately documented in the resident's note. I have made edits and changes where appropriate, and agree with plan.  Celine Mans, MD, PGY-2 [facility] 11:57 AM 04/29/2023

## 2023-04-28 NOTE — Plan of Care (Signed)
Patient ID: Eustace Quail, female   DOB: [birth year], 87 y.o.   MRN: [id number]  Problem: Education: Goal: Knowledge of General Education information will improve Description: Including pain rating scale, medication(s)/side effects and non-pharmacologic comfort measures Outcome: Progressing   Problem: Health Behavior/Discharge Planning: Goal: Ability to manage health-related needs will improve Outcome: Progressing   Problem: Clinical Measurements: Goal: Ability to maintain clinical measurements within normal limits will improve Outcome: Progressing Goal: Will remain free from infection Outcome: Progressing Goal: Diagnostic test results will improve Outcome: Progressing Goal: Respiratory complications will improve Outcome: Progressing Goal: Cardiovascular complication will be avoided Outcome: Progressing   Problem: Activity: Goal: Risk for activity intolerance will decrease Outcome: Progressing   Problem: Nutrition: Goal: Adequate nutrition will be maintained Outcome: Progressing   Problem: Coping: Goal: Level of anxiety will decrease Outcome: Progressing   Problem: Elimination: Goal: Will not experience complications related to bowel motility Outcome: Progressing Goal: Will not experience complications related to urinary retention Outcome: Progressing   Problem: Pain Management: Goal: General experience of comfort will improve Outcome: Progressing   Problem: Safety: Goal: Ability to remain free from injury will improve Outcome: Progressing   Problem: Skin Integrity: Goal: Risk for impaired skin integrity will decrease Outcome: Progressing    Lidia Collum, RN

## 2023-04-28 NOTE — Assessment & Plan Note (Addendum)
CT head and MRI brain negative for acute stroke. Risk stratification labs with Ha1c 5.3, LDL 116. Acute symptoms have resolved. Pending further imaging to investigate for cardioembolic etiology. Neuro recommendations ASA 81 and plavix 75mg  daily Lipitor 40mg  daily Pending echo  Pending carotid doppler No needs per PT/OT/SLP

## 2023-04-29 DIAGNOSIS — G459 Transient cerebral ischemic attack, unspecified: Secondary | ICD-10-CM | POA: Diagnosis not present

## 2023-04-29 MED ORDER — CLOPIDOGREL BISULFATE 75 MG PO TABS: 75.0000 mg | ORAL_TABLET | Freq: Every day | ORAL | 0 refills | Status: DC

## 2023-04-29 MED ORDER — ASPIRIN 81 MG PO TBEC: 81.0000 mg | DELAYED_RELEASE_TABLET | Freq: Every day | ORAL | 0 refills | Status: AC

## 2023-04-29 MED ORDER — SYSTANE BALANCE 0.6 % OP SOLN: 1.0000 [drp] | Freq: Four times a day (QID) | OPHTHALMIC | Status: AC | PRN

## 2023-04-29 MED ORDER — ATORVASTATIN CALCIUM 40 MG PO TABS: 40.0000 mg | ORAL_TABLET | Freq: Every day | ORAL | 0 refills | Status: DC

## 2023-04-29 MED ORDER — ASPIRIN 81 MG PO TBEC: 81.0000 mg | DELAYED_RELEASE_TABLET | Freq: Every day | ORAL | Status: DC

## 2023-04-29 NOTE — Progress Notes (Addendum)
STROKE TEAM PROGRESS NOTE   SUBJECTIVE (INTERVAL HISTORY) No family is at the bedside.  Pt doing well, no acute event overnight.    OBJECTIVE Temp:  [98.1 F (36.7 C)-98.8 F (37.1 C)] 98.3 F (36.8 C) (11/10 0822) Pulse Rate:  [63-73] 68 (11/10 0822) Cardiac Rhythm: Normal sinus rhythm (11/10 0700) Resp:  [16-18] 16 (11/10 0331) BP: (120-133)/(61-68) 133/67 (11/10 0822) SpO2:  [95 %-99 %] 97 % (11/10 0822)  Recent Labs  Lab 04/27/23 1207  GLUCAP 117*   Recent Labs  Lab 04/27/23 1208 04/27/23 1212 04/28/23 0435  NA 140 140 140  K 3.9 3.7 3.8  CL 106 103 107  CO2 24  --  26  GLUCOSE 114* 113* 84  BUN 16 19 12   CREATININE 1.19* 1.20* 0.99  CALCIUM 9.4  --  9.5   Recent Labs  Lab 04/27/23 1208  AST 28  ALT 22  ALKPHOS 51  BILITOT 1.4*  PROT 6.8  ALBUMIN 3.7   Recent Labs  Lab 04/27/23 1208 04/27/23 1212 04/28/23 0435  WBC 3.9*  --  3.3*  NEUTROABS 1.8  --   --   HGB 12.0 12.6 11.4*  HCT 35.5* 37.0 34.0*  MCV 91.7  --  89.5  PLT 246  --  242   No results for input(s): "CKTOTAL", "CKMB", "CKMBINDEX", "TROPONINI" in the last 168 hours. Recent Labs    04/27/23 1208  LABPROT 14.0  INR 1.1   Recent Labs    04/27/23 1524  COLORURINE STRAW*  LABSPEC 1.005  PHURINE 8.0  GLUCOSEU NEGATIVE  HGBUR NEGATIVE  BILIRUBINUR NEGATIVE  KETONESUR NEGATIVE  PROTEINUR NEGATIVE  NITRITE NEGATIVE  LEUKOCYTESUR SMALL*       Component Value Date/Time   CHOL 182 04/28/2023 0435   TRIG 62 04/28/2023 0435   HDL 54 04/28/2023 0435   CHOLHDL 3.4 04/28/2023 0435   VLDL 12 04/28/2023 0435   LDLCALC 116 (H) 04/28/2023 0435   Lab Results  Component Value Date   HGBA1C 5.3 04/28/2023      Component Value Date/Time   LABOPIA NONE DETECTED 04/27/2023 1524   COCAINSCRNUR NONE DETECTED 04/27/2023 1524   LABBENZ NONE DETECTED 04/27/2023 1524   AMPHETMU NONE DETECTED 04/27/2023 1524   THCU NONE DETECTED 04/27/2023 1524   LABBARB NONE DETECTED 04/27/2023 1524     Recent Labs  Lab 04/27/23 1208  ETH <10    I have personally reviewed the radiological images below and agree with the radiology interpretations.  ECHOCARDIOGRAM COMPLETE  Result Date: 04/28/2023    ECHOCARDIOGRAM REPORT   Patient Name:   [name] Date of Exam: 04/28/2023 Medical Rec #:  [id number]          Height:       57.0 in Accession #:    [id number]         Weight:       135.0 lb Date of Birth:  Mar 03, 1936          BSA:          1.522 m Patient Age:    87 years           BP:           99/66 mmHg Patient Gender: F                  HR:           69 bpm. Exam Location:  Inpatient Procedure: 2D Echo, Color Doppler and Cardiac Doppler Indications:  TIA  History:        Patient has prior history of Echocardiogram examinations, most                 recent 03/15/2014. TIA; Risk Factors:Hypertension and                 Dyslipidemia.  Sonographer:    Milbert Coulter Referring Phys: [phone] ROBERT C PATERSON IMPRESSIONS  1. Left ventricular ejection fraction, by estimation, is 60 to 65%. The left ventricle has normal function. The left ventricle has no regional wall motion abnormalities. Left ventricular diastolic parameters were normal.  2. Right ventricular systolic function is normal. The right ventricular size is normal. Tricuspid regurgitation signal is inadequate for assessing PA pressure.  3. A small pericardial effusion is present. The pericardial effusion is anterior to the right ventricle. There is no evidence of cardiac tamponade.  4. The mitral valve is normal in structure. Trivial mitral valve regurgitation. No evidence of mitral stenosis.  5. The aortic valve is tricuspid. There is mild thickening of the aortic valve. Aortic valve regurgitation is not visualized. Aortic valve sclerosis is present, with no evidence of aortic valve stenosis.  6. 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  7. The inferior vena cava is dilated in size with >50% respiratory  variability, suggesting right atrial pressure of 8 mmHg. FINDINGS  Left Ventricle: Left ventricular ejection fraction, by estimation, is 60 to 65%. The left ventricle has normal function. The left ventricle has no regional wall motion abnormalities. The left ventricular internal cavity size was normal in size. There is  no left ventricular hypertrophy. Left ventricular diastolic parameters were normal. Right Ventricle: The right ventricular size is normal. No increase in right ventricular wall thickness. Right ventricular systolic function is normal. Tricuspid regurgitation signal is inadequate for assessing PA pressure. Left Atrium: Left atrial size was normal in size. Right Atrium: Right atrial size was normal in size. Pericardium: A small pericardial effusion is present. The pericardial effusion is anterior to the right ventricle. There is no evidence of cardiac tamponade. Mitral Valve: The mitral valve is normal in structure. Trivial mitral valve regurgitation. No evidence of mitral valve stenosis. Tricuspid Valve: The tricuspid valve is normal in structure. Tricuspid valve regurgitation is not demonstrated. No evidence of tricuspid stenosis. Aortic Valve: The aortic valve is tricuspid. There is mild thickening of the aortic valve. Aortic valve regurgitation is not visualized. Aortic valve sclerosis is present, with no evidence of aortic valve stenosis. Aortic valve mean gradient measures 4.0  mmHg. Aortic valve peak gradient measures 7.2 mmHg. Aortic valve area, by VTI measures 2.06 cm. Pulmonic Valve: The pulmonic valve was normal in structure. Pulmonic valve regurgitation is not visualized. No evidence of pulmonic stenosis. Aorta: The ascending aorta and the arch are not well seen. The aortic root is normal in size and structure. There is severe (Grade IV) protruding plaque involving the descending aorta. Venous: The inferior vena cava is dilated in size with greater than 50% respiratory variability,  suggesting right atrial pressure of 8 mmHg. IAS/Shunts: No atrial level shunt detected by color flow Doppler.  LEFT VENTRICLE PLAX 2D LVIDd:         3.80 cm   Diastology LVIDs:         2.20 cm   LV e' medial:    11.50 cm/s LV PW:         0.90 cm   LV E/e' medial:  7.4 LV IVS:  1.00 cm   LV e' lateral:   11.70 cm/s LVOT diam:     1.70 cm   LV E/e' lateral: 7.3 LV SV:         52 LV SV Index:   34 LVOT Area:     2.27 cm  RIGHT VENTRICLE RV Basal diam:  2.90 cm RV Mid diam:    2.30 cm RV S prime:     19.30 cm/s TAPSE (M-mode): 2.6 cm LEFT ATRIUM             Index        RIGHT ATRIUM          Index LA diam:        2.90 cm 1.91 cm/m   RA Area:     8.47 cm LA Vol (A2C):   23.7 ml 15.57 ml/m  RA Volume:   15.60 ml 10.25 ml/m LA Vol (A4C):   13.8 ml 9.07 ml/m LA Biplane Vol: 18.4 ml 12.09 ml/m  AORTIC VALVE AV Area (Vmax):    2.05 cm AV Area (Vmean):   1.95 cm AV Area (VTI):     2.06 cm AV Vmax:           134.00 cm/s AV Vmean:          87.000 cm/s AV VTI:            0.250 m AV Peak Grad:      7.2 mmHg AV Mean Grad:      4.0 mmHg LVOT Vmax:         121.00 cm/s LVOT Vmean:        74.900 cm/s LVOT VTI:          0.227 m LVOT/AV VTI ratio: 0.91  AORTA Ao Root diam: 2.60 cm MITRAL VALVE MV Area (PHT): 2.74 cm    SHUNTS MV Decel Time: 277 msec    Systemic VTI:  0.23 m MV E velocity: 85.40 cm/s  Systemic Diam: 1.70 cm MV A velocity: 94.90 cm/s MV E/A ratio:  0.90 Mihai Croitoru MD Electronically signed by Thurmon Fair MD Signature Date/Time: 04/28/2023/6:25:19 PM    Final    VAS US CAROTID  Result Date: 04/28/2023 Carotid Arterial Duplex Study Patient Name:  [name]  Date of Exam:   04/28/2023 Medical Rec #: [id number]           Accession #:    [id number] Date of Birth: 28-Jan-1936           Patient Gender: F Patient Age:   82 years Exam Location:  [facility] Procedure:      VAS US CAROTID Referring Phys: Scheryl Marten Sarvesh Meddaugh --------------------------------------------------------------------------------   Indications:       TIA and Weakness. Risk Factors:      Hypertension, hyperlipidemia. Limitations        Today's exam was limited due to vessel tortuosity. Comparison Study:  Prior carotid duplex indicating 1-39% bilateral ICA stenosis                    done 03/14/14 Performing Technologist: Sherren Kerns RVS  Examination Guidelines: A complete evaluation includes B-mode imaging, spectral Doppler, color Doppler, and power Doppler as needed of all accessible portions of each vessel. Bilateral testing is considered an integral part of a complete examination. Limited examinations for reoccurring indications may be performed as noted.  Right Carotid Findings: +----------+--------+--------+--------+------------------+------------------+           PSV cm/sEDV cm/sStenosisPlaque DescriptionComments           +----------+--------+--------+--------+------------------+------------------+  CCA Prox  90      22                                intimal thickening +----------+--------+--------+--------+------------------+------------------+ CCA Distal84      16                                intimal thickening +----------+--------+--------+--------+------------------+------------------+ ICA Prox  87      21      1-39%   heterogenous                         +----------+--------+--------+--------+------------------+------------------+ ICA Mid   84      28                                tortuous           +----------+--------+--------+--------+------------------+------------------+ ICA Distal101     27                                tortuous           +----------+--------+--------+--------+------------------+------------------+ ECA       75      7                                                    +----------+--------+--------+--------+------------------+------------------+ +----------+--------+-------+--------+-------------------+           PSV cm/sEDV cmsDescribeArm Pressure  (mmHG) +----------+--------+-------+--------+-------------------+ [id number]                                         +----------+--------+-------+--------+-------------------+ +---------+--------+--+--------+--+ VertebralPSV cm/s70EDV cm/s12 +---------+--------+--+--------+--+  Left Carotid Findings: +----------+--------+--------+--------+------------------+------------------+           PSV cm/sEDV cm/sStenosisPlaque DescriptionComments           +----------+--------+--------+--------+------------------+------------------+ CCA Prox  96      16                                intimal thickening +----------+--------+--------+--------+------------------+------------------+ CCA Distal111     15                                intimal thickening +----------+--------+--------+--------+------------------+------------------+ ICA Prox  87      23      1-39%   heterogenous                         +----------+--------+--------+--------+------------------+------------------+ ICA Mid   69      17                                tortuous           +----------+--------+--------+--------+------------------+------------------+ ICA Distal78      22  tortuous           +----------+--------+--------+--------+------------------+------------------+ ECA       71      10                                                   +----------+--------+--------+--------+------------------+------------------+ +----------+--------+--------+--------+-------------------+           PSV cm/sEDV cm/sDescribeArm Pressure (mmHG) +----------+--------+--------+--------+-------------------+ Subclavian50                                          +----------+--------+--------+--------+-------------------+ +---------+--------+--+--------+--+ VertebralPSV cm/s65EDV cm/s19 +---------+--------+--+--------+--+   Summary: Right Carotid: Velocities in the right ICA  are consistent with a 1-39% stenosis. Left Carotid: Velocities in the left ICA are consistent with a 1-39% stenosis. Vertebrals:  Bilateral vertebral arteries demonstrate antegrade flow. Subclavians: Normal flow hemodynamics were seen in bilateral subclavian              arteries. *See table(s) above for measurements and observations.     Preliminary    MR BRAIN WO CONTRAST  Result Date: 04/27/2023 CLINICAL DATA:  Initial evaluation for acute neuro deficit, stroke suspected. EXAM: MRI HEAD WITHOUT CONTRAST MRA HEAD WITHOUT CONTRAST TECHNIQUE: Multiplanar, multi-echo pulse sequences of the brain and surrounding structures were acquired without intravenous contrast. Angiographic images of the Circle of Willis were acquired using MRA technique without intravenous contrast. COMPARISON:  CT from earlier the same day. FINDINGS: MRI HEAD FINDINGS Brain: Cerebral volume within normal limits. Patchy T2/FLAIR hyperintensity involving the periventricular and deep white matter of both cerebral hemispheres as well as the pons, consistent with chronic small vessel ischemic disease, mild to moderate in nature. No evidence for acute or subacute ischemia. Gray-white matter differentiation maintained. No acute or chronic intracranial blood products. No mass lesion, midline shift or mass effect. No hydrocephalus or extra-axial fluid collection. Pituitary gland suprasellar region within normal limits. Vascular: Major intracranial vascular flow voids are maintained. Skull and upper cervical spine: Craniocervical junction within normal limits. Bone marrow signal intensity normal. No scalp soft tissue abnormality. Sinuses/Orbits: Globes and orbital soft tissues are within normal limits. Paranasal sinuses and mastoid air cells are largely clear. Other: None. MRA HEAD FINDINGS Anterior circulation: Both internal carotid arteries widely patent to the termini without stenosis. A1 segments widely patent. Normal anterior communicating  artery complex. Both anterior cerebral arteries widely patent to their distal aspects without stenosis. No M1 stenosis or occlusion. Normal MCA bifurcations. Distal MCA branches well perfused and symmetric. Posterior circulation: Both V4 segments patent to the vertebrobasilar junction without stenosis. Both PICA origins patent and normal. Basilar widely patent to its distal aspect without stenosis. Superior cerebellar arteries patent bilaterally. Both PCAs primarily supplied via the basilar and are well perfused to there distal aspects. Anatomic variants: None significant.  No aneurysm. IMPRESSION: MRI HEAD: 1. No acute intracranial abnormality. 2. Mild to moderate chronic microvascular ischemic disease for age. MRA HEAD: Normal intracranial MRA. Electronically Signed   By: Rise Mu M.D.   On: 04/27/2023 17:29   MR ANGIO HEAD WO CONTRAST  Result Date: 04/27/2023 CLINICAL DATA:  Initial evaluation for acute neuro deficit, stroke suspected. EXAM: MRI HEAD WITHOUT CONTRAST MRA HEAD WITHOUT CONTRAST TECHNIQUE: Multiplanar, multi-echo pulse sequences of the brain and surrounding structures  were acquired without intravenous contrast. Angiographic images of the Circle of Willis were acquired using MRA technique without intravenous contrast. COMPARISON:  CT from earlier the same day. FINDINGS: MRI HEAD FINDINGS Brain: Cerebral volume within normal limits. Patchy T2/FLAIR hyperintensity involving the periventricular and deep white matter of both cerebral hemispheres as well as the pons, consistent with chronic small vessel ischemic disease, mild to moderate in nature. No evidence for acute or subacute ischemia. Gray-white matter differentiation maintained. No acute or chronic intracranial blood products. No mass lesion, midline shift or mass effect. No hydrocephalus or extra-axial fluid collection. Pituitary gland suprasellar region within normal limits. Vascular: Major intracranial vascular flow voids are  maintained. Skull and upper cervical spine: Craniocervical junction within normal limits. Bone marrow signal intensity normal. No scalp soft tissue abnormality. Sinuses/Orbits: Globes and orbital soft tissues are within normal limits. Paranasal sinuses and mastoid air cells are largely clear. Other: None. MRA HEAD FINDINGS Anterior circulation: Both internal carotid arteries widely patent to the termini without stenosis. A1 segments widely patent. Normal anterior communicating artery complex. Both anterior cerebral arteries widely patent to their distal aspects without stenosis. No M1 stenosis or occlusion. Normal MCA bifurcations. Distal MCA branches well perfused and symmetric. Posterior circulation: Both V4 segments patent to the vertebrobasilar junction without stenosis. Both PICA origins patent and normal. Basilar widely patent to its distal aspect without stenosis. Superior cerebellar arteries patent bilaterally. Both PCAs primarily supplied via the basilar and are well perfused to there distal aspects. Anatomic variants: None significant.  No aneurysm. IMPRESSION: MRI HEAD: 1. No acute intracranial abnormality. 2. Mild to moderate chronic microvascular ischemic disease for age. MRA HEAD: Normal intracranial MRA. Electronically Signed   By: Rise Mu M.D.   On: 04/27/2023 17:29   CT HEAD CODE STROKE WO CONTRAST  Result Date: 04/27/2023 CLINICAL DATA:  Code stroke. Neuro deficit, acute, stroke suspected. Left facial weakness. EXAM: CT HEAD WITHOUT CONTRAST TECHNIQUE: Contiguous axial images were obtained from the base of the skull through the vertex without intravenous contrast. RADIATION DOSE REDUCTION: This exam was performed according to the departmental dose-optimization program which includes automated exposure control, adjustment of the mA and/or kV according to patient size and/or use of iterative reconstruction technique. COMPARISON:  Head CT 07/19/2015. FINDINGS: Brain: No acute  hemorrhage. Unchanged mild chronic small-vessel disease. Cortical gray-white differentiation is otherwise preserved. Prominence of the ventricles and sulci within expected range for age. No hydrocephalus or extra-axial collection. No mass effect or midline shift. Vascular: No hyperdense vessel or unexpected calcification. Skull: No calvarial fracture or suspicious bone lesion. Skull base is unremarkable. Sinuses/Orbits: No acute finding. Other: None. ASPECTS ([place]ta Stroke Program Early CT Score) - Ganglionic level infarction (caudate, lentiform nuclei, internal capsule, insula, M1-M3 cortex): 7 - Supraganglionic infarction (M4-M6 cortex): 3 Total score (0-10 with 10 being normal): 10 IMPRESSION: No acute intracranial hemorrhage or evidence of acute large vessel territory infarct. ASPECT score is 10. Code stroke imaging results were communicated on 04/27/2023 at 12:21 pm to provider Dr. Selina Cooley via secure text paging. Electronically Signed   By: Orvan Falconer M.D.   On: 04/27/2023 12:21     PHYSICAL EXAM  Temp:  [98.1 F (36.7 C)-98.8 F (37.1 C)] 98.3 F (36.8 C) (11/10 0822) Pulse Rate:  [63-73] 68 (11/10 0822) Resp:  [16-18] 16 (11/10 0331) BP: (120-133)/(61-68) 133/67 (11/10 0822) SpO2:  [95 %-99 %] 97 % (11/10 0822)  General - Well nourished, well developed, in no apparent distress.  Ophthalmologic - fundi  not visualized due to noncooperation.  Cardiovascular - Regular rhythm and rate.  Mental Status -  Level of arousal and orientation to time, place, and person were intact. Language including expression, naming, repetition, comprehension was assessed and found intact. Attention span and concentration were normal. Fund of Knowledge was assessed and was intact.  Cranial Nerves II - XII - II - Visual field intact OU. III, IV, VI - Extraocular movements intact. V - Facial sensation intact bilaterally. VII - Facial movement intact bilaterally. VIII - Hearing & vestibular intact  bilaterally. X - Palate elevates symmetrically. XI - Chin turning & shoulder shrug intact bilaterally. XII - Tongue protrusion intact.  Motor Strength - The patient's strength was normal in all extremities and pronator drift was absent.  Bulk was normal and fasciculations were absent.   Motor Tone - Muscle tone was assessed at the neck and appendages and was normal.  Reflexes - The patient's reflexes were symmetrical in all extremities and she had no pathological reflexes.  Sensory - Light touch, temperature/pinprick were assessed and were symmetrical.  However, patient has left foot subjective feeling of numbness.  Coordination - The patient had normal movements in the hands and feet with no ataxia or dysmetria.  Tremor was absent.  Gait and Station - deferred.   ASSESSMENT/PLAN [name] is a 87 y.o. female with history of hypertension, hyperlipidemia, IBS, esophageal stricture,?  TIA in 2015 admitted for palpitation followed by left-sided arm and leg numbness and weakness. No tPA given due to mild symptoms.    TIA:  right brain TIA infarct, etiology unclear, concerning for cardioembolic source with heart palpitation.  DDx including complicated migraine CT no acute abnormality MRI no infarct MRA unremarkable Carotid Doppler unremarkable 2D Echo EF 60-65%. 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  LDL 116 HgbA1c 5.3 UDS negative SCDs for VTE prophylaxis clopidogrel 75 mg daily prior to admission, now on aspirin 81 mg daily and clopidogrel 75 mg daily DAPT for 3 weeks and then Plavix alone. Recommend 30-day cardiac event monitoring as outpatient to rule out A-fib Patient counseled to be compliant with her antithrombotic medications Ongoing aggressive stroke risk factor management Therapy recommendations: None Disposition: Pending  History of ? TIA 02/2014 admitted for episode of confusion.  MRI and MRA unremarkable.  EEG no seizure.  LDL  118.  Discharged on aspirin 81 and Zocor.  Heart palpitation New onset of heart palpitation yesterday lasting 30 minutes EKG shows sinus rhythm Recommend 30-day cardiac event monitoring as outpatient to rule out A-fib If negative, may consider loop recorder   Hypertension Stable Long term BP goal normotensive  Hyperlipidemia Home meds: Lipitor 20 LDL 113, goal < 70 Now on 40 Continue statin at discharge  Other Stroke Risk Factors Advanced age Aortic atherosclerosis - echo showed Severe (Grade IV) protruding plaque involving the descending aorta.   Other Active Problems Intermittent headache, ? Migraine  Hospital day # 0  Neurology will sign off. Please call with questions. Pt will follow up with stroke clinic NP at [facility] in about 4 weeks. Thanks for the consult.   Marvel Plan, MD PhD Stroke Neurology 04/29/2023 11:57 AM    To contact Stroke Continuity provider, please refer to [email or web address]. After hours, contact General Neurology

## 2023-04-29 NOTE — Progress Notes (Signed)
Patient discharged home with patient.  Discharge instruction given to patient.  Patient is comfortable ,  vital sign stable,  denies any discomfort upon discharge.

## 2023-04-29 NOTE — Discharge Instructions (Signed)
Dear Eustace Quail,  Thank you for letting us participate in your care. You were hospitalized for *** and diagnosed with TIA (transient ischemic attack). You were treated with ***.   POST-HOSPITAL & CARE INSTRUCTIONS Go to your pharmacy today to pick up prescription.  Your statin medication has been increased from 20-40.  If you can pick it up today it is okay to take 2 doses of the 20s. Take aspirin and Plavix for 21 days and then Plavix alone indefinitely.  Go to your follow up appointments (listed below)   DOCTOR'S APPOINTMENT   Future Appointments  Date Time Provider Department Center  05/16/2023 11:00 AM Glenford Bayley, NP [facility] None  06/28/2023 11:15 AM Standiford, Jenelle Mages, DPM [facility]     Take care and be well!  Family Medicine Teaching Service Inpatient Team [place]  [facility]  [address] [place], [place] [address] [phone]

## 2023-05-16 ENCOUNTER — Encounter: Payer: Self-pay | Admitting: Primary Care

## 2023-05-16 ENCOUNTER — Ambulatory Visit: Payer: Medicare Other | Admitting: Primary Care

## 2023-05-16 VITALS — BP 117/58 | HR 63 | Temp 97.4°F | Ht <= 58 in | Wt 131.4 lb

## 2023-05-16 DIAGNOSIS — J45909 Unspecified asthma, uncomplicated: Secondary | ICD-10-CM | POA: Diagnosis not present

## 2023-05-16 DIAGNOSIS — Z0000 Encounter for general adult medical examination without abnormal findings: Secondary | ICD-10-CM

## 2023-05-16 NOTE — Progress Notes (Signed)
@Patient  ID: [name], female    DOB: 1935/09/04, 88 y.o.   MRN: [id number]  Chief Complaint  Patient presents with   Follow-up    Referring provider: No ref. provider found  HPI: 87 year old female, former smoker.  Medical history significant for hypertension, TIA, asthma, allergic rhinitis, GERD, history of cholecystectomy, dyslipidemia.  05/16/2023 Discussed the use of AI scribe software for clinical note transcription with the patient, who gave verbal consent to proceed.  History of Present Illness   Patient presents today for six-month follow-up/asthma. She reports good control of her asthma symptoms, with no flare-ups or hospitalizations for respiratory issues since her last visit in May. She has been maintained on Symbicort and albuterol inhalers, and reports no adverse effects. She also uses a spacer with the Symbicort inhaler.  The patient had a recent hospitalization for a transient ischemic attack (TIA), during which a Zio patch was recommended but not yet received. She has an upcoming appointment with a neurologist.  She also reports a history of bronchitis, for which she was treated with prednisone and antibiotics, and a past COVID-19 infection. She has been using a flutter valve for chest congestion, but needs a replacement.  The patient has been managing gastroesophageal reflux disease (GERD) with medication and dietary modifications. She takes Claritin as needed for allergies.    Allergies  Allergen Reactions   Cepacol Shortness Of Breath    Difficulty breathing   Contrast Media [Iodinated Contrast Media] Shortness Of Breath and Swelling    Pt reports "I could not breath at all and my face swelled up"    Iodine Shortness Of Breath   Lactose Intolerance (Gi) Shortness Of Breath    All dairy  Causes shortness of breath   Mushroom Extract Complex Shortness Of Breath   Other Hives, Shortness Of Breath and Swelling    Mushrooms (All mushrooms)    Penicillins Shortness Of Breath and Swelling    DID THE REACTION INVOLVE: Swelling of the face/tongue/throat, SOB, or low BP? Yes  Sudden or severe rash/hives, skin peeling, or the inside of the mouth or nose? No Did it require medical treatment? No When did it last happen? Over 10 years ago   If all above answers are "NO", may proceed with cephalosporin use.    Sulfamethoxazole Anaphylaxis   Ciprofloxacin     Other reaction(s): Unknown   Budesonide Other (See Comments)    Headache    Immunization History  Administered Date(s) Administered   Influenza Inj Mdck Quad Pf 06/16/2018   Influenza,inj,Quad PF,6+ Mos 04/01/2019   Influenza,inj,Quad PF,6-35 Mos 06/03/2018   Influenza-Unspecified 04/19/2020, 03/03/2022   PFIZER(Purple Top)SARS-COV-2 Vaccination 09/23/2019, 10/14/2019, 05/04/2020   PPD Test 11/04/2019   Pneumococcal Conjugate-13 03/20/2016   Pneumococcal Polysaccharide-23 12/23/2004   Td 12/23/2004    Past Medical History:  Diagnosis Date   Arthritis    Asthma    Blood clotting disorder (HCC)    Cataract    COPD (chronic obstructive pulmonary disease) (HCC)    Depression    Diverticulosis 10-2011   Colonoscopy   GERD (gastroesophageal reflux disease)    Hemorrhoids, internal    Hx of colonic polyp    Hyperlipemia    Hypertension    IBS (irritable bowel syndrome)    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   EGD   TIA (transient ischemic attack)     Tobacco History: Social History   Tobacco Use  Smoking Status Former   Current packs/day: 0.00  Average packs/day: 0.5 packs/day for 4.3 years (2.1 ttl pk-yrs)   Types: Cigarettes   Start date: 97   Quit date: 09/28/1959   Years since quitting: 63.6   Passive exposure: Never  Smokeless Tobacco Never   Counseling given: Not Answered   Outpatient Medications Prior to Visit  Medication Sig Dispense Refill   albuterol (PROVENTIL) (2.5 MG/3ML) 0.083% nebulizer solution Take 3 mLs (2.5 mg total) by nebulization 4  (four) times daily. 150 mL 5   albuterol (VENTOLIN HFA) 108 (90 Base) MCG/ACT inhaler INHALE TWO PUFFS BY MOUTH INTO LUNGS EVERY SIX HOURS AS NEEDED FOR WHEEZING AND/OR SHORTNESS OF BREATH 8.5 g 2   Alpha-D-Galactosidase (BEANO PO) Take 1 capsule by mouth as needed (for gas and bloating).      aspirin EC 81 MG tablet Take 1 tablet (81 mg total) by mouth daily for 20 days. Swallow whole. 20 tablet 0   atorvastatin (LIPITOR) 40 MG tablet Take 1 tablet (40 mg total) by mouth daily. 30 tablet 0   cholecalciferol (VITAMIN D3) 25 MCG (1000 UNIT) tablet Take 1,000 Units by mouth daily.     clopidogrel (PLAVIX) 75 MG tablet Take 1 tablet (75 mg total) by mouth daily. 30 tablet 0   cycloSPORINE (RESTASIS) 0.05 % ophthalmic emulsion Place 1 drop into both eyes 2 (two) times daily.     EPINEPHrine 0.3 mg/0.3 mL IJ SOAJ injection Inject 0.3 mg into the muscle once as needed (for a severe allergic reaction/use as directed).     loratadine (CLARITIN) 10 MG tablet Take 1 tablet (10 mg total) by mouth daily. (Patient taking differently: Take 10 mg by mouth daily as needed.) 30 tablet 5   Multiple Vitamin (MULTIVITAMIN) tablet Take 1 tablet by mouth daily.      Probiotic Product (PROBIOTIC PO) Take 1 capsule by mouth daily.      Propylene Glycol (SYSTANE BALANCE) 0.6 % SOLN Place 1 drop into both eyes 4 (four) times daily as needed (dry eyes).     Respiratory Therapy Supplies (FLUTTER) DEVI Use device 2-3 times a day to break up congestion 1 each 0   Spacer/Aero-Holding Chambers (AEROCHAMBER MV) inhaler Use as instructed 1 each 0   SYMBICORT 80-4.5 MCG/ACT inhaler Inhale 2 puffs into the lungs in the morning and at bedtime. 1 each 12   No facility-administered medications prior to visit.    Review of Systems  Review of Systems  Constitutional: Negative.   Respiratory:  Negative for chest tightness, shortness of breath and wheezing.   Cardiovascular: Negative.     Physical Exam  BP (!) 117/58 (BP  Location: Left Arm, Patient Position: Sitting, Cuff Size: Normal)   Pulse 63   Temp (!) 97.4 F (36.3 C) (Oral)   Ht 4\' 8"  (1.422 m)   Wt 131 lb 6.4 oz (59.6 kg)   SpO2 98%   BMI 29.46 kg/m  Physical Exam Constitutional:      Appearance: Normal appearance.  HENT:     Head: Normocephalic and atraumatic.  Cardiovascular:     Rate and Rhythm: Normal rate and regular rhythm.  Pulmonary:     Effort: Pulmonary effort is normal.     Breath sounds: Normal breath sounds.  Musculoskeletal:        General: Normal range of motion.  Skin:    General: Skin is warm and dry.  Neurological:     General: No focal deficit present.     Mental Status: She is alert and oriented to person,  place, and time. Mental status is at baseline.  Psychiatric:        Mood and Affect: Mood normal.        Behavior: Behavior normal.        Thought Content: Thought content normal.        Judgment: Judgment normal.     Lab Results:  CBC    Component Value Date/Time   WBC 3.3 (L) 04/28/2023 0435   RBC 3.80 (L) 04/28/2023 0435   HGB 11.4 (L) 04/28/2023 0435   HCT 34.0 (L) 04/28/2023 0435   PLT 242 04/28/2023 0435   MCV 89.5 04/28/2023 0435   MCH 30.0 04/28/2023 0435   MCHC 33.5 04/28/2023 0435   RDW 13.4 04/28/2023 0435   LYMPHSABS 1.6 04/27/2023 1208   MONOABS 0.6 04/27/2023 1208   EOSABS 0.1 04/27/2023 1208   BASOSABS 0.0 04/27/2023 1208    BMET    Component Value Date/Time   NA 140 04/28/2023 0435   K 3.8 04/28/2023 0435   CL 107 04/28/2023 0435   CO2 26 04/28/2023 0435   GLUCOSE 84 04/28/2023 0435   BUN 12 04/28/2023 0435   CREATININE 0.99 04/28/2023 0435   CALCIUM 9.5 04/28/2023 0435   GFRNONAA 56 (L) 04/28/2023 0435   GFRAA >60 09/23/2019 2114    BNP No results found for: "BNP"  ProBNP No results found for: "PROBNP"  Imaging: VAS US CAROTID  Result Date: 05/02/2023 Carotid Arterial Duplex Study Patient Name:  MAKIRAH CORAL  Date of Exam:   04/28/2023 Medical Rec #:  [id number]           Accession #:    [id number] Date of Birth: 03-22-1936           Patient Gender: F Patient Age:   9 years Exam Location:  [facility] Procedure:      VAS US CAROTID Referring Phys: Scheryl Marten XU --------------------------------------------------------------------------------  Indications:       TIA and Weakness. Risk Factors:      Hypertension, hyperlipidemia. Limitations        Today's exam was limited due to vessel tortuosity. Comparison Study:  Prior carotid duplex indicating 1-39% bilateral ICA stenosis                    done 03/14/14 Performing Technologist: Sherren Kerns RVS  Examination Guidelines: A complete evaluation includes B-mode imaging, spectral Doppler, color Doppler, and power Doppler as needed of all accessible portions of each vessel. Bilateral testing is considered an integral part of a complete examination. Limited examinations for reoccurring indications may be performed as noted.  Right Carotid Findings: +----------+--------+--------+--------+------------------+------------------+           PSV cm/sEDV cm/sStenosisPlaque DescriptionComments           +----------+--------+--------+--------+------------------+------------------+ CCA Prox  90      22                                intimal thickening +----------+--------+--------+--------+------------------+------------------+ CCA Distal84      16                                intimal thickening +----------+--------+--------+--------+------------------+------------------+ ICA Prox  87      21      1-39%   heterogenous                         +----------+--------+--------+--------+------------------+------------------+  ICA Mid   84      28                                tortuous           +----------+--------+--------+--------+------------------+------------------+ ICA Distal101     27                                tortuous            +----------+--------+--------+--------+------------------+------------------+ ECA       75      7                                                    +----------+--------+--------+--------+------------------+------------------+ +----------+--------+-------+--------+-------------------+           PSV cm/sEDV cmsDescribeArm Pressure (mmHG) +----------+--------+-------+--------+-------------------+ [id number]                                         +----------+--------+-------+--------+-------------------+ +---------+--------+--+--------+--+ VertebralPSV cm/s70EDV cm/s12 +---------+--------+--+--------+--+  Left Carotid Findings: +----------+--------+--------+--------+------------------+------------------+           PSV cm/sEDV cm/sStenosisPlaque DescriptionComments           +----------+--------+--------+--------+------------------+------------------+ CCA Prox  96      16                                intimal thickening +----------+--------+--------+--------+------------------+------------------+ CCA Distal111     15                                intimal thickening +----------+--------+--------+--------+------------------+------------------+ ICA Prox  87      23      1-39%   heterogenous                         +----------+--------+--------+--------+------------------+------------------+ ICA Mid   69      17                                tortuous           +----------+--------+--------+--------+------------------+------------------+ ICA Distal78      22                                tortuous           +----------+--------+--------+--------+------------------+------------------+ ECA       71      10                                                   +----------+--------+--------+--------+------------------+------------------+ +----------+--------+--------+--------+-------------------+           PSV cm/sEDV cm/sDescribeArm Pressure  (mmHG) +----------+--------+--------+--------+-------------------+ Subclavian50                                          +----------+--------+--------+--------+-------------------+ +---------+--------+--+--------+--+  VertebralPSV cm/s65EDV cm/s19 +---------+--------+--+--------+--+   Summary: Right Carotid: Velocities in the right ICA are consistent with a 1-39% stenosis. Left Carotid: Velocities in the left ICA are consistent with a 1-39% stenosis. Vertebrals:  Bilateral vertebral arteries demonstrate antegrade flow. Subclavians: Normal flow hemodynamics were seen in bilateral subclavian              arteries. *See table(s) above for measurements and observations.  Electronically signed by Delia Heady MD on 05/02/2023 at 3:30:25 PM.    Final    ECHOCARDIOGRAM COMPLETE  Result Date: 04/28/2023    ECHOCARDIOGRAM REPORT   Patient Name:   NARALI SAVARIA Date of Exam: 04/28/2023 Medical Rec #:  [id number]          Height:       57.0 in Accession #:    [id number]         Weight:       135.0 lb Date of Birth:  Nov 03, 1935          BSA:          1.522 m Patient Age:    87 years           BP:           99/66 mmHg Patient Gender: F                  HR:           69 bpm. Exam Location:  Inpatient Procedure: 2D Echo, Color Doppler and Cardiac Doppler Indications:    TIA  History:        Patient has prior history of Echocardiogram examinations, most                 recent 03/15/2014. TIA; Risk Factors:Hypertension and                 Dyslipidemia.  Sonographer:    Milbert Coulter Referring Phys: [phone] ROBERT C PATERSON IMPRESSIONS  1. Left ventricular ejection fraction, by estimation, is 60 to 65%. The left ventricle has normal function. The left ventricle has no regional wall motion abnormalities. Left ventricular diastolic parameters were normal.  2. Right ventricular systolic function is normal. The right ventricular size is normal. Tricuspid regurgitation signal is inadequate for assessing PA pressure.  3.  A small pericardial effusion is present. The pericardial effusion is anterior to the right ventricle. There is no evidence of cardiac tamponade.  4. The mitral valve is normal in structure. Trivial mitral valve regurgitation. No evidence of mitral stenosis.  5. The aortic valve is tricuspid. There is mild thickening of the aortic valve. Aortic valve regurgitation is not visualized. Aortic valve sclerosis is present, with no evidence of aortic valve stenosis.  6. 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  7. The inferior vena cava is dilated in size with >50% respiratory variability, suggesting right atrial pressure of 8 mmHg. FINDINGS  Left Ventricle: Left ventricular ejection fraction, by estimation, is 60 to 65%. The left ventricle has normal function. The left ventricle has no regional wall motion abnormalities. The left ventricular internal cavity size was normal in size. There is  no left ventricular hypertrophy. Left ventricular diastolic parameters were normal. Right Ventricle: The right ventricular size is normal. No increase in right ventricular wall thickness. Right ventricular systolic function is normal. Tricuspid regurgitation signal is inadequate for assessing PA pressure. Left Atrium: Left atrial size was normal in size. Right Atrium: Right atrial size was normal  in size. Pericardium: A small pericardial effusion is present. The pericardial effusion is anterior to the right ventricle. There is no evidence of cardiac tamponade. Mitral Valve: The mitral valve is normal in structure. Trivial mitral valve regurgitation. No evidence of mitral valve stenosis. Tricuspid Valve: The tricuspid valve is normal in structure. Tricuspid valve regurgitation is not demonstrated. No evidence of tricuspid stenosis. Aortic Valve: The aortic valve is tricuspid. There is mild thickening of the aortic valve. Aortic valve regurgitation is not visualized. Aortic valve sclerosis is present,  with no evidence of aortic valve stenosis. Aortic valve mean gradient measures 4.0  mmHg. Aortic valve peak gradient measures 7.2 mmHg. Aortic valve area, by VTI measures 2.06 cm. Pulmonic Valve: The pulmonic valve was normal in structure. Pulmonic valve regurgitation is not visualized. No evidence of pulmonic stenosis. Aorta: The ascending aorta and the arch are not well seen. The aortic root is normal in size and structure. There is severe (Grade IV) protruding plaque involving the descending aorta. Venous: The inferior vena cava is dilated in size with greater than 50% respiratory variability, suggesting right atrial pressure of 8 mmHg. IAS/Shunts: No atrial level shunt detected by color flow Doppler.  LEFT VENTRICLE PLAX 2D LVIDd:         3.80 cm   Diastology LVIDs:         2.20 cm   LV e' medial:    11.50 cm/s LV PW:         0.90 cm   LV E/e' medial:  7.4 LV IVS:        1.00 cm   LV e' lateral:   11.70 cm/s LVOT diam:     1.70 cm   LV E/e' lateral: 7.3 LV SV:         52 LV SV Index:   34 LVOT Area:     2.27 cm  RIGHT VENTRICLE RV Basal diam:  2.90 cm RV Mid diam:    2.30 cm RV S prime:     19.30 cm/s TAPSE (M-mode): 2.6 cm LEFT ATRIUM             Index        RIGHT ATRIUM          Index LA diam:        2.90 cm 1.91 cm/m   RA Area:     8.47 cm LA Vol (A2C):   23.7 ml 15.57 ml/m  RA Volume:   15.60 ml 10.25 ml/m LA Vol (A4C):   13.8 ml 9.07 ml/m LA Biplane Vol: 18.4 ml 12.09 ml/m  AORTIC VALVE AV Area (Vmax):    2.05 cm AV Area (Vmean):   1.95 cm AV Area (VTI):     2.06 cm AV Vmax:           134.00 cm/s AV Vmean:          87.000 cm/s AV VTI:            0.250 m AV Peak Grad:      7.2 mmHg AV Mean Grad:      4.0 mmHg LVOT Vmax:         121.00 cm/s LVOT Vmean:        74.900 cm/s LVOT VTI:          0.227 m LVOT/AV VTI ratio: 0.91  AORTA Ao Root diam: 2.60 cm MITRAL VALVE MV Area (PHT): 2.74 cm    SHUNTS MV Decel Time: 277 msec    Systemic VTI:  0.23 m MV E velocity: 85.40 cm/s  Systemic Diam: 1.70 cm MV A  velocity: 94.90 cm/s MV E/A ratio:  0.90 Mihai Croitoru MD Electronically signed by Thurmon Fair MD Signature Date/Time: 04/28/2023/6:25:19 PM    Final    MR BRAIN WO CONTRAST  Result Date: 04/27/2023 CLINICAL DATA:  Initial evaluation for acute neuro deficit, stroke suspected. EXAM: MRI HEAD WITHOUT CONTRAST MRA HEAD WITHOUT CONTRAST TECHNIQUE: Multiplanar, multi-echo pulse sequences of the brain and surrounding structures were acquired without intravenous contrast. Angiographic images of the Circle of Willis were acquired using MRA technique without intravenous contrast. COMPARISON:  CT from earlier the same day. FINDINGS: MRI HEAD FINDINGS Brain: Cerebral volume within normal limits. Patchy T2/FLAIR hyperintensity involving the periventricular and deep white matter of both cerebral hemispheres as well as the pons, consistent with chronic small vessel ischemic disease, mild to moderate in nature. No evidence for acute or subacute ischemia. Gray-white matter differentiation maintained. No acute or chronic intracranial blood products. No mass lesion, midline shift or mass effect. No hydrocephalus or extra-axial fluid collection. Pituitary gland suprasellar region within normal limits. Vascular: Major intracranial vascular flow voids are maintained. Skull and upper cervical spine: Craniocervical junction within normal limits. Bone marrow signal intensity normal. No scalp soft tissue abnormality. Sinuses/Orbits: Globes and orbital soft tissues are within normal limits. Paranasal sinuses and mastoid air cells are largely clear. Other: None. MRA HEAD FINDINGS Anterior circulation: Both internal carotid arteries widely patent to the termini without stenosis. A1 segments widely patent. Normal anterior communicating artery complex. Both anterior cerebral arteries widely patent to their distal aspects without stenosis. No M1 stenosis or occlusion. Normal MCA bifurcations. Distal MCA branches well perfused and  symmetric. Posterior circulation: Both V4 segments patent to the vertebrobasilar junction without stenosis. Both PICA origins patent and normal. Basilar widely patent to its distal aspect without stenosis. Superior cerebellar arteries patent bilaterally. Both PCAs primarily supplied via the basilar and are well perfused to there distal aspects. Anatomic variants: None significant.  No aneurysm. IMPRESSION: MRI HEAD: 1. No acute intracranial abnormality. 2. Mild to moderate chronic microvascular ischemic disease for age. MRA HEAD: Normal intracranial MRA. Electronically Signed   By: Rise Mu M.D.   On: 04/27/2023 17:29   MR ANGIO HEAD WO CONTRAST  Result Date: 04/27/2023 CLINICAL DATA:  Initial evaluation for acute neuro deficit, stroke suspected. EXAM: MRI HEAD WITHOUT CONTRAST MRA HEAD WITHOUT CONTRAST TECHNIQUE: Multiplanar, multi-echo pulse sequences of the brain and surrounding structures were acquired without intravenous contrast. Angiographic images of the Circle of Willis were acquired using MRA technique without intravenous contrast. COMPARISON:  CT from earlier the same day. FINDINGS: MRI HEAD FINDINGS Brain: Cerebral volume within normal limits. Patchy T2/FLAIR hyperintensity involving the periventricular and deep white matter of both cerebral hemispheres as well as the pons, consistent with chronic small vessel ischemic disease, mild to moderate in nature. No evidence for acute or subacute ischemia. Gray-white matter differentiation maintained. No acute or chronic intracranial blood products. No mass lesion, midline shift or mass effect. No hydrocephalus or extra-axial fluid collection. Pituitary gland suprasellar region within normal limits. Vascular: Major intracranial vascular flow voids are maintained. Skull and upper cervical spine: Craniocervical junction within normal limits. Bone marrow signal intensity normal. No scalp soft tissue abnormality. Sinuses/Orbits: Globes and orbital  soft tissues are within normal limits. Paranasal sinuses and mastoid air cells are largely clear. Other: None. MRA HEAD FINDINGS Anterior circulation: Both internal carotid arteries widely patent to the termini without stenosis. A1  segments widely patent. Normal anterior communicating artery complex. Both anterior cerebral arteries widely patent to their distal aspects without stenosis. No M1 stenosis or occlusion. Normal MCA bifurcations. Distal MCA branches well perfused and symmetric. Posterior circulation: Both V4 segments patent to the vertebrobasilar junction without stenosis. Both PICA origins patent and normal. Basilar widely patent to its distal aspect without stenosis. Superior cerebellar arteries patent bilaterally. Both PCAs primarily supplied via the basilar and are well perfused to there distal aspects. Anatomic variants: None significant.  No aneurysm. IMPRESSION: MRI HEAD: 1. No acute intracranial abnormality. 2. Mild to moderate chronic microvascular ischemic disease for age. MRA HEAD: Normal intracranial MRA. Electronically Signed   By: Rise Mu M.D.   On: 04/27/2023 17:29   CT HEAD CODE STROKE WO CONTRAST  Result Date: 04/27/2023 CLINICAL DATA:  Code stroke. Neuro deficit, acute, stroke suspected. Left facial weakness. EXAM: CT HEAD WITHOUT CONTRAST TECHNIQUE: Contiguous axial images were obtained from the base of the skull through the vertex without intravenous contrast. RADIATION DOSE REDUCTION: This exam was performed according to the departmental dose-optimization program which includes automated exposure control, adjustment of the mA and/or kV according to patient size and/or use of iterative reconstruction technique. COMPARISON:  Head CT 07/19/2015. FINDINGS: Brain: No acute hemorrhage. Unchanged mild chronic small-vessel disease. Cortical gray-white differentiation is otherwise preserved. Prominence of the ventricles and sulci within expected range for age. No hydrocephalus  or extra-axial collection. No mass effect or midline shift. Vascular: No hyperdense vessel or unexpected calcification. Skull: No calvarial fracture or suspicious bone lesion. Skull base is unremarkable. Sinuses/Orbits: No acute finding. Other: None. ASPECTS ([place] Stroke Program Early CT Score) - Ganglionic level infarction (caudate, lentiform nuclei, internal capsule, insula, M1-M3 cortex): 7 - Supraganglionic infarction (M4-M6 cortex): 3 Total score (0-10 with 10 being normal): 10 IMPRESSION: No acute intracranial hemorrhage or evidence of acute large vessel territory infarct. ASPECT score is 10. Code stroke imaging results were communicated on 04/27/2023 at 12:21 pm to provider Dr. Selina Cooley via secure text paging. Electronically Signed   By: Orvan Falconer M.D.   On: 04/27/2023 12:21     Assessment & Plan:   1. Healthcare maintenance - Ambulatory referral to Internal Medicine  2. Intrinsic asthma - Flutter valve; Future     Asthma Well controlled with Symbicort and Albuterol. No recent flare-ups or hospitalizations for breathing issues. Continue medications as prescribed, refills sent.  Transient Ischemic Attack (TIA) Recent hospitalization for TIA. Zio patch was recommended but not received. Discuss ordering of Zio patch with neurologist at upcoming appointment.  Gastroesophageal Reflux Disease (GERD) Managed with medication and diet.  Allergies Managed with Claritin as needed.  General Health Maintenance -Replace broken flutter valve. -Refer to a new primary care doctor per patient's request. -Schedule follow-up in six months with Dr. Delton Coombes.      Glenford Bayley, NP 05/16/2023

## 2023-05-16 NOTE — Patient Instructions (Addendum)
-  ASTHMA: Asthma is a condition where your airways narrow and swell, producing extra mucus, which can make breathing difficult. Your asthma is well controlled with Symbicort and Albuterol. Please continue using these medications as needed. We have also refilled your Symbicort prescription.  -TRANSIENT ISCHEMIC ATTACK (TIA): A TIA is often called a mini-stroke and occurs when there is a temporary decrease in blood supply to part of the brain. You had a recent hospitalization for TIA, and a Zio patch was recommended but not yet received. Please discuss the ordering of the Zio patch with your neurologist at your upcoming appointment.  -GASTROESOPHAGEAL REFLUX DISEASE (GERD): GERD is a digestive disorder where stomach acid irritates the food pipe lining. Your GERD is managed with medication and dietary changes. Please continue with your current management plan.  -ALLERGIES: Allergies occur when your immune system reacts to a foreign substance. Your allergies are managed with Claritin as needed. Please continue taking Claritin when necessary.  -GENERAL HEALTH MAINTENANCE: we will replace your broken flutter valve, refer you to a new primary care doctor as per your request, please get annual flu vaccine with your pharmacy   Referral: Internal medicine re: new PCP   Follow-up: 6 months with DR. Byrum or sooner if bneeded

## 2023-05-30 ENCOUNTER — Other Ambulatory Visit: Payer: Self-pay | Admitting: Neurology

## 2023-05-30 ENCOUNTER — Ambulatory Visit (INDEPENDENT_AMBULATORY_CARE_PROVIDER_SITE_OTHER): Payer: Medicare Other | Admitting: Neurology

## 2023-05-30 ENCOUNTER — Encounter: Payer: Self-pay | Admitting: Neurology

## 2023-05-30 ENCOUNTER — Telehealth: Payer: Self-pay | Admitting: Neurology

## 2023-05-30 VITALS — BP 115/71 | HR 69 | Ht <= 58 in | Wt 134.5 lb

## 2023-05-30 DIAGNOSIS — G459 Transient cerebral ischemic attack, unspecified: Secondary | ICD-10-CM

## 2023-05-30 DIAGNOSIS — R002 Palpitations: Secondary | ICD-10-CM

## 2023-05-30 DIAGNOSIS — E785 Hyperlipidemia, unspecified: Secondary | ICD-10-CM

## 2023-05-30 MED ORDER — ATORVASTATIN CALCIUM 40 MG PO TABS: 40.0000 mg | ORAL_TABLET | Freq: Every day | ORAL | 0 refills | Status: DC

## 2023-05-30 NOTE — Addendum Note (Signed)
Addended by: Eather Colas E on: 05/30/2023 02:00 PM   Modules accepted: Orders

## 2023-05-30 NOTE — Patient Instructions (Addendum)
I will order cardiac monitor I placed referral to primary care Continue Plavix for secondary stroke prevention  Continue Lipitor for cholesterol  Strict management of vascular risk factors with a goal BP less than 130/90, A1c less than 7.0, LDL less than 70 for secondary stroke prevention Will need repeat scan of ECHO findings

## 2023-05-30 NOTE — Telephone Encounter (Signed)
If she needs a refill, we can refill until she gets in with primary care. She will need repeat labs in the next month or so.  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  I also ordered cardiac monitor, referral to primary care.  If she does not hear anything in the next week she should let me know.

## 2023-05-30 NOTE — Telephone Encounter (Signed)
Refilled for one month

## 2023-05-30 NOTE — Progress Notes (Signed)
Patient: [name] Date of Birth: 10/20/1935  Reason for Visit: Follow up History from: Patient Primary Neurologist: Pearlean Brownie   ASSESSMENT AND PLAN 87 y.o. year old female with right brain TIA.  Etiology unclear, concerning for cardioembolic source with presenting heart palpitations.  Also consider complicated migraine.  Vascular risk factors: History TIA 2015, HTN, HLD, advanced age, aortic atherosclerosis.  No residual symptoms.  -I will order cardiac monitor, has had 2 episodes of palpitations since hospital discharge -I will refer to [facility] health Internal Medicine per patient request.  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  -Continue Plavix 75 mg daily for secondary stroke prevention -Strict management of vascular risk factors with a goal BP less than 130/90, A1c less than 7.0, LDL less than 70 for secondary stroke prevention -Follow-up with me in 6 months or sooner if needed  HISTORY OF PRESENT ILLNESS: Today 05/30/23 She is here alone. Her son lives with her, he is handicap. Remains on higher dose Lipitor 40, she tried 3 weeks of DAPT aspirin and plavix, but the aspirin "burned her stomach". She stayed on Plavix 75 mg daily. Today, BP 115/71. Cardiac monitor was ordered, but not done yet. Mentions still taking Losartan and hydrochlorothiazide, was told they wanted her to come off but has continued. Has has 2 episodes of palpations, remembers was active, felt "flutter", few minutes. Did have any immediate neuro symptoms afterwards. Feels generally weak from anemia. Has no residual left sided symptoms. Last episode of palpations was a week ago. Has chronic history of headache, usually left sided parietal area, when BP elevated? Takes Tylenol with good benefit, when BP comes down feels better.   HISTORY  Admitted 04/27/2023 for palpitations followed by left-sided arm and leg numbness and  weakness.  Felt to have right brain TIA, etiology unclear.  Concerning for cardioembolic source with heart palpitation.  Also consider complicated migraine.  History of TIA in 2015 presented as confusion.  -CT head no acute abnormality -MRI of the brain no acute infarct -MRA of the head unremarkable -Carotid Doppler unremarkable -2D echo EF 60 to 65%, severe grade 4 protruding plaque involving the descending aorta -LDL 116, Lipitor increased to 40 -A1c 5.3 -UDS negative -Plavix 75 mg daily prior to admission, 3 weeks DAPT aspirin 81 and Plavix 75 then Plavix alone -Recommended 30-day cardiac monitor to rule out A-fib if negative, may consider loop recorder  REVIEW OF SYSTEMS: Out of a complete 14 system review of symptoms, the patient complains only of the following symptoms, and all other reviewed systems are negative.  See HPI  ALLERGIES: Allergies  Allergen Reactions   Cepacol Shortness Of Breath    Difficulty breathing   Contrast Media [Iodinated Contrast Media] Shortness Of Breath and Swelling    Pt reports "I could not breath at all and my face swelled up"    Iodine Shortness Of Breath   Lactose Intolerance (Gi) Shortness Of Breath    All dairy  Causes shortness of breath   Mushroom Extract Complex (Do Not Select) Shortness Of Breath   Other Hives, Shortness Of Breath and Swelling    Mushrooms (All mushrooms)   Penicillins Shortness Of Breath and Swelling    DID THE REACTION INVOLVE: Swelling of the face/tongue/throat, SOB, or low BP? Yes  Sudden or severe rash/hives, skin peeling, or the inside of the mouth or nose? No Did it require medical treatment? No When did it  last happen? Over 10 years ago   If all above answers are "NO", may proceed with cephalosporin use.    Sulfamethoxazole Anaphylaxis   Aspirin Other (See Comments)    "Burns stomach up"   Ciprofloxacin     Other reaction(s): Unknown   Budesonide Other (See Comments)    Headache    HOME  MEDICATIONS: Outpatient Medications Prior to Visit  Medication Sig Dispense Refill   albuterol (PROVENTIL) (2.5 MG/3ML) 0.083% nebulizer solution Take 3 mLs (2.5 mg total) by nebulization 4 (four) times daily. 150 mL 5   albuterol (VENTOLIN HFA) 108 (90 Base) MCG/ACT inhaler INHALE TWO PUFFS BY MOUTH INTO LUNGS EVERY SIX HOURS AS NEEDED FOR WHEEZING AND/OR SHORTNESS OF BREATH 8.5 g 2   Alpha-D-Galactosidase (BEANO PO) Take 1 capsule by mouth as needed (for gas and bloating).      atorvastatin (LIPITOR) 40 MG tablet Take 1 tablet (40 mg total) by mouth daily. 30 tablet 0   cholecalciferol (VITAMIN D3) 25 MCG (1000 UNIT) tablet Take 1,000 Units by mouth daily.     clopidogrel (PLAVIX) 75 MG tablet Take 1 tablet (75 mg total) by mouth daily. 30 tablet 0   cycloSPORINE (RESTASIS) 0.05 % ophthalmic emulsion Place 1 drop into both eyes 2 (two) times daily.     EPINEPHrine 0.3 mg/0.3 mL IJ SOAJ injection Inject 0.3 mg into the muscle once as needed (for a severe allergic reaction/use as directed).     loratadine (CLARITIN) 10 MG tablet Take 1 tablet (10 mg total) by mouth daily. (Patient taking differently: Take 10 mg by mouth daily as needed.) 30 tablet 5   Multiple Vitamin (MULTIVITAMIN) tablet Take 1 tablet by mouth daily.      Probiotic Product (PROBIOTIC PO) Take 1 capsule by mouth daily.      Propylene Glycol (SYSTANE BALANCE) 0.6 % SOLN Place 1 drop into both eyes 4 (four) times daily as needed (dry eyes).     Respiratory Therapy Supplies (FLUTTER) DEVI Use device 2-3 times a day to break up congestion 1 each 0   Spacer/Aero-Holding Chambers (AEROCHAMBER MV) inhaler Use as instructed 1 each 0   SYMBICORT 80-4.5 MCG/ACT inhaler Inhale 2 puffs into the lungs in the morning and at bedtime. 1 each 12   No facility-administered medications prior to visit.    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   EGD   TIA (transient ischemic attack)     PAST SURGICAL HISTORY: Past Surgical History:  Procedure Laterality Date   CESAREAN SECTION  1962   LAPAROSCOPIC CHOLECYSTECTOMY  1995    FAMILY HISTORY: Family History  Problem Relation Age of Onset   Heart disease Mother    Hypertension Mother    Diabetes Mother    Asthma Father    Colon cancer Sister 49   Irritable bowel syndrome Sister    Breast cancer Other        Niece   Stomach cancer Neg Hx     SOCIAL HISTORY: Social History   Socioeconomic  History   Marital status: Widowed    Spouse name: Not on file   Number of children: 1   Years of education: college   Highest education level: Not on file  Occupational History   Occupation: retired  Tobacco Use   Smoking status: Former    Current packs/day: 0.00    Average packs/day: 0.5 packs/day for 4.3 years (2.1 ttl pk-yrs)    Types: Cigarettes    Start date: 2    Quit date: 09/28/1959    Years since quitting: 63.7    Passive exposure: Never   Smokeless tobacco: Never  Vaping Use   Vaping status: Never Used  Substance and Sexual Activity   Alcohol use: No    Alcohol/week: 0.0 standard drinks of alcohol   Drug use: No   Sexual activity: Not Currently  Other Topics Concern   Not on file  Social History Narrative   Patient is widowed with one child.   Patient is right handed.   Patient has college education.   Patient does not drink caffeine.   Social Determinants of Health   Financial Resource Strain: Not on file  Food Insecurity: Medium Risk (05/18/2023)   [facility]   Hunger Vital Sign    Worried About Running Out of Food in the  Last Year: Patient declined to answer    Ran Out of Food in the Last Year: Sometimes true  Transportation Needs: Not on file (05/18/2023)  Physical Activity: Not on file  Stress: Not on file  Social Connections: Not on file  Intimate Partner Violence: Not At Risk (04/27/2023)   Humiliation, Afraid, Rape, and Kick questionnaire    Fear of Current or Ex-Partner: No    Emotionally Abused: No    Physically Abused: No    Sexually Abused: No   PHYSICAL EXAM  Vitals:   05/30/23 1001  BP: 115/71  Pulse: 69  SpO2: 94%  Weight: 134 lb 8 oz (61 kg)  Height: 4\' 8"  (1.422 m)   Body mass index is 30.15 kg/m.  Generalized: Well developed, in no acute distress  Neurological examination  Mentation: Alert oriented to time, place, history taking. Follows all commands speech and language fluent Cranial nerve II-XII: Pupils were equal round reactive to light. Extraocular movements were full, visual field were full on confrontational test. Facial sensation and strength were normal.  Head turning and shoulder shrug  were normal and symmetric. Motor: The motor testing reveals 5 over 5 strength of all 4 extremities. Good symmetric motor tone is noted throughout.  Sensory: Sensory testing is intact to soft touch on all 4 extremities. No evidence of extinction is noted.  Coordination: Cerebellar testing reveals good finger-nose-finger and heel-to-shin bilaterally.  Gait and station: Gait is normal.  Reflexes: Deep tendon reflexes are symmetric and normal bilaterally.   DIAGNOSTIC DATA (LABS, IMAGING, TESTING) - I reviewed patient records, labs, notes, testing and imaging myself where available.  Lab Results  Component Value Date   WBC 3.3 (L) 04/28/2023   HGB 11.4 (L) 04/28/2023   HCT 34.0 (L) 04/28/2023   MCV 89.5 04/28/2023   PLT 242 04/28/2023      Component Value Date/Time   NA 140 04/28/2023 0435   K 3.8 04/28/2023 0435   CL 107 04/28/2023 0435   CO2 26 04/28/2023 0435   GLUCOSE 84  04/28/2023 0435   BUN 12 04/28/2023 0435   CREATININE 0.99 04/28/2023 0435   CALCIUM 9.5 04/28/2023 0435   PROT  6.8 04/27/2023 1208   ALBUMIN 3.7 04/27/2023 1208   AST 28 04/27/2023 1208   ALT 22 04/27/2023 1208   ALKPHOS 51 04/27/2023 1208   BILITOT 1.4 (H) 04/27/2023 1208   GFRNONAA 56 (L) 04/28/2023 0435   GFRAA >60 09/23/2019 2114   Lab Results  Component Value Date   CHOL 182 04/28/2023   HDL 54 04/28/2023   LDLCALC 116 (H) 04/28/2023   TRIG 62 04/28/2023   CHOLHDL 3.4 04/28/2023   Lab Results  Component Value Date   HGBA1C 5.3 04/28/2023   No results found for: "VITAMINB12" Lab Results  Component Value Date   TSH 1.583 04/27/2023    Margie Ege, AGNP-C, DNP 05/30/2023, 10:13 AM [facility] [address] [place], [place] [address] [phone]

## 2023-05-30 NOTE — Telephone Encounter (Signed)
Pt is asking if Maralyn Sago, NP will agree to manage her atorvastatin (LIPITOR) 40 MG tablet , please call to discuss.

## 2023-05-31 ENCOUNTER — Encounter (HOSPITAL_COMMUNITY): Payer: Self-pay | Admitting: Emergency Medicine

## 2023-05-31 ENCOUNTER — Emergency Department (HOSPITAL_COMMUNITY): Payer: Medicare Other

## 2023-05-31 ENCOUNTER — Emergency Department (HOSPITAL_COMMUNITY)
Admission: EM | Admit: 2023-05-31 | Discharge: 2023-05-31 | Disposition: A | Discharge status: Home / Self Care | Payer: Medicare Other | Attending: Emergency Medicine | Admitting: Emergency Medicine

## 2023-05-31 DIAGNOSIS — R0789 Other chest pain: Secondary | ICD-10-CM | POA: Insufficient documentation

## 2023-05-31 DIAGNOSIS — R079 Chest pain, unspecified: Secondary | ICD-10-CM | POA: Diagnosis present

## 2023-05-31 DIAGNOSIS — Z7901 Long term (current) use of anticoagulants: Secondary | ICD-10-CM | POA: Insufficient documentation

## 2023-05-31 LAB — CBC WITH DIFFERENTIAL/PLATELET
Abs Immature Granulocytes: 0.01 10*3/uL (ref 0.00–0.07)
Basophils Absolute: 0 10*3/uL (ref 0.0–0.1)
Basophils Relative: 1 %
Eosinophils Absolute: 0 10*3/uL (ref 0.0–0.5)
Eosinophils Relative: 1 %
HCT: 36.7 % (ref 36.0–46.0)
Hemoglobin: 12.3 g/dL (ref 12.0–15.0)
Immature Granulocytes: 0 %
Lymphocytes Relative: 30 %
Lymphs Abs: 1 10*3/uL (ref 0.7–4.0)
MCH: 30.8 pg (ref 26.0–34.0)
MCHC: 33.5 g/dL (ref 30.0–36.0)
MCV: 91.8 fL (ref 80.0–100.0)
Monocytes Absolute: 0.5 10*3/uL (ref 0.1–1.0)
Monocytes Relative: 13 %
Neutro Abs: 1.8 10*3/uL (ref 1.7–7.7)
Neutrophils Relative %: 55 %
Platelets: 244 10*3/uL (ref 150–400)
RBC: 4 MIL/uL (ref 3.87–5.11)
RDW: 13.4 % (ref 11.5–15.5)
WBC: 3.4 10*3/uL — ABNORMAL LOW (ref 4.0–10.5)
nRBC: 0 % (ref 0.0–0.2)

## 2023-05-31 LAB — COMPREHENSIVE METABOLIC PANEL
ALT: 22 U/L (ref 0–44)
AST: 24 U/L (ref 15–41)
Albumin: 3.7 g/dL (ref 3.5–5.0)
Alkaline Phosphatase: 47 U/L (ref 38–126)
Anion gap: 9 (ref 5–15)
BUN: 9 mg/dL (ref 8–23)
CO2: 25 mmol/L (ref 22–32)
Calcium: 9.5 mg/dL (ref 8.9–10.3)
Chloride: 106 mmol/L (ref 98–111)
Creatinine, Ser: 0.97 mg/dL (ref 0.44–1.00)
GFR, Estimated: 57 mL/min — ABNORMAL LOW (ref >60)
Glucose, Bld: 85 mg/dL (ref 70–99)
Potassium: 3.5 mmol/L (ref 3.5–5.1)
Sodium: 140 mmol/L (ref 135–145)
Total Bilirubin: 1.4 mg/dL — ABNORMAL HIGH (ref <1.2)
Total Protein: 7 g/dL (ref 6.5–8.1)

## 2023-05-31 LAB — LIPASE, BLOOD: Lipase: 38 U/L (ref 11–51)

## 2023-05-31 LAB — TROPONIN I (HIGH SENSITIVITY)
Troponin I (High Sensitivity): 5 ng/L (ref <18)
Troponin I (High Sensitivity): 4 ng/L (ref <18)

## 2023-05-31 MED ORDER — PANTOPRAZOLE SODIUM 40 MG PO TBEC: 40.0000 mg | DELAYED_RELEASE_TABLET | Freq: Every day | ORAL | 0 refills | Status: DC

## 2023-05-31 MED ORDER — ALUM & MAG HYDROXIDE-SIMETH 200-200-20 MG/5ML PO SUSP
30.0000 mL | Freq: Once | ORAL | Status: AC
  Administered 2023-05-31: 30 mL via ORAL
  Filled 2023-05-31: qty 30

## 2023-05-31 MED ORDER — DIPHENHYDRAMINE HCL 50 MG/ML IJ SOLN: 50.0000 mg | Freq: Once | INTRAMUSCULAR | Status: AC

## 2023-05-31 MED ORDER — IOHEXOL 350 MG/ML SOLN
100.0000 mL | Freq: Once | INTRAVENOUS | Status: AC | PRN
  Administered 2023-05-31: 100 mL via INTRAVENOUS

## 2023-05-31 MED ORDER — METHYLPREDNISOLONE SODIUM SUCC 40 MG IJ SOLR
40.0000 mg | Freq: Once | INTRAMUSCULAR | Status: AC
  Administered 2023-05-31: 40 mg via INTRAVENOUS
  Filled 2023-05-31: qty 1

## 2023-05-31 MED ORDER — DIPHENHYDRAMINE HCL 25 MG PO CAPS
50.0000 mg | ORAL_CAPSULE | Freq: Once | ORAL | Status: AC
  Administered 2023-05-31: 50 mg via ORAL
  Filled 2023-05-31: qty 2

## 2023-05-31 MED ORDER — LIDOCAINE VISCOUS HCL 2 % MT SOLN
15.0000 mL | Freq: Once | OROMUCOSAL | Status: AC
  Administered 2023-05-31: 15 mL via ORAL
  Filled 2023-05-31: qty 15

## 2023-05-31 NOTE — ED Provider Notes (Signed)
  ED Course / MDM   Clinical Course as of 05/31/23 1449  Thu May 31, 2023  5621 Received sign out from Dr. Clayborne Dana. Presenting with burning chest pain. CXR and troponin are reassuring. Had TIA one month ago. Had incidental aortic plaque during TIA workup.  [WS]  1447 Patient reports that the Maalox did not fact improve her symptoms.  CT scan was obtained after premedication.  She had no reaction to contrast.  Her CT scan did not show any evidence of aortic plaque.  Suspect previous echocardiography finding was an artifact.  She does have coronary artery calcifications.  Her troponin is negative x 2.  Overall very low concern for any acute coronary process.  I have placed a referral for cardiology.  Her symptoms do sound similar to reflux in their character so I have prescribed her an antiacid medication. Will discharge patient to home. All questions answered. Patient comfortable with plan of discharge. Return precautions discussed with patient and specified on the after visit summary.  [WS]    Clinical Course User Index [WS] Lonell Grandchild, MD   Medical Decision Making Amount and/or Complexity of Data Reviewed Labs: ordered. Radiology: ordered.  Risk OTC drugs. Prescription drug management.         Lonell Grandchild, MD 05/31/23 [phone]

## 2023-05-31 NOTE — ED Notes (Addendum)
CT notified via phone-call that patient received pre-medication and will be ready for CT in one hour.

## 2023-05-31 NOTE — ED Notes (Signed)
Help patient up to the bedside toilet patient did well

## 2023-05-31 NOTE — ED Provider Notes (Incomplete)
[place] EMERGENCY DEPARTMENT AT [facility] Provider Note   CSN: [id number] Arrival date & time: 05/31/23  3244     History {Add pertinent medical, surgical, social history, OB history to HPI:1} Chief Complaint  Patient presents with   Chest Pain    [name] is a 87 y.o. female.   Chest Pain      Home Medications Prior to Admission medications   Medication Sig Start Date End Date Taking? Authorizing Provider  albuterol (PROVENTIL) (2.5 MG/3ML) 0.083% nebulizer solution Take 3 mLs (2.5 mg total) by nebulization 4 (four) times daily. 10/03/19   Glenford Bayley, NP  albuterol (VENTOLIN HFA) 108 (90 Base) MCG/ACT inhaler INHALE TWO PUFFS BY MOUTH INTO LUNGS EVERY SIX HOURS AS NEEDED FOR WHEEZING AND/OR SHORTNESS OF BREATH 11/29/22   Leslye Peer, MD  Alpha-D-Galactosidase (BEANO PO) Take 1 capsule by mouth as needed (for gas and bloating).     [provider]  atorvastatin (LIPITOR) 40 MG tablet Take 1 tablet (40 mg total) by mouth daily. 05/30/23   Glean Salvo, NP  cholecalciferol (VITAMIN D3) 25 MCG (1000 UNIT) tablet Take 1,000 Units by mouth daily.    [provider]  clopidogrel (PLAVIX) 75 MG tablet Take 1 tablet (75 mg total) by mouth daily. 04/30/23   Meryl Dare, MD  cycloSPORINE (RESTASIS) 0.05 % ophthalmic emulsion Place 1 drop into both eyes 2 (two) times daily.    [provider]  EPINEPHrine 0.3 mg/0.3 mL IJ SOAJ injection Inject 0.3 mg into the muscle once as needed (for a severe allergic reaction/use as directed). 02/01/18   [provider]  loratadine (CLARITIN) 10 MG tablet Take 1 tablet (10 mg total) by mouth daily. Patient taking differently: Take 10 mg by mouth daily as needed. 04/21/22   Glenford Bayley, NP  Multiple Vitamin (MULTIVITAMIN) tablet Take 1 tablet by mouth daily.     [provider]  Probiotic Product (PROBIOTIC PO) Take 1 capsule by mouth daily.     [provider]  Propylene Glycol (SYSTANE BALANCE) 0.6 % SOLN Place 1 drop into both eyes 4 (four) times daily as needed (dry eyes). 04/29/23   Celine Mans, MD  Respiratory Therapy Supplies (FLUTTER) DEVI Use device 2-3 times a day to break up congestion 07/17/18   Glenford Bayley, NP  Spacer/Aero-Holding Chambers (AEROCHAMBER MV) inhaler Use as instructed 11/08/22   Leslye Peer, MD  SYMBICORT 80-4.5 MCG/ACT inhaler Inhale 2 puffs into the lungs in the morning and at bedtime. 11/08/22   Leslye Peer, MD      Allergies    Cepacol, Contrast media [iodinated contrast media], Iodine, Lactose intolerance (gi), Mushroom extract complex (do not select), Other, Penicillins, Sulfamethoxazole, Aspirin, Ciprofloxacin, and Budesonide    Review of Systems   Review of Systems  Cardiovascular:  Positive for chest pain.    Physical Exam Updated Vital Signs BP 133/64   Pulse 60   Temp 98.4 F (36.9 C) (Oral)   Resp 19   Ht 4\' 8"  (1.422 m)   Wt 59 kg   SpO2 99%   BMI 29.15 kg/m  Physical Exam  ED Results / Procedures / Treatments   Labs (all labs ordered are listed, but only abnormal results are displayed) Labs Reviewed  CBC WITH DIFFERENTIAL/PLATELET - Abnormal; Notable for the following components:      Result Value   WBC 3.4 (*)    All other components within normal limits  COMPREHENSIVE METABOLIC PANEL - Abnormal; Notable for the following components:   Total Bilirubin 1.4 (*)    GFR, Estimated 57 (*)    All other components within normal limits  LIPASE, BLOOD  TROPONIN I (HIGH SENSITIVITY)  TROPONIN I (HIGH SENSITIVITY)    EKG None  Radiology No results found.  Procedures Procedures  {Document cardiac monitor, telemetry assessment procedure when appropriate:1}  Medications Ordered in ED Medications  methylPREDNISolone sodium succinate (SOLU-MEDROL) 40 mg/mL injection 40 mg (has no administration in time range)  diphenhydrAMINE (BENADRYL) capsule 50 mg (has no  administration in time range)    Or  diphenhydrAMINE (BENADRYL) injection 50 mg (has no administration in time range)  alum & mag hydroxide-simeth (MAALOX/MYLANTA) 200-200-20 MG/5ML suspension 30 mL (30 mLs Oral Given 05/31/23 0626)    And  lidocaine (XYLOCAINE) 2 % viscous mouth solution 15 mL (15 mLs Oral Given 05/31/23 6962)    ED Course/ Medical Decision Making/ A&P Clinical Course as of 06/06/23 9528  Thu May 31, 2023  4132 Received sign out from Dr. Clayborne Dana. Presenting with burning chest pain. CXR and troponin are reassuring. Had TIA one month ago. Had incidental aortic plaque during TIA workup.  [WS]  1447 Patient reports that the Maalox did not fact improve her symptoms.  CT scan was obtained after premedication.  She had no reaction to contrast.  Her CT scan did not show any evidence of aortic plaque.  Suspect previous echocardiography finding was an artifact.  She does have coronary artery calcifications.  Her troponin is negative x 2.  Overall very low concern for any acute coronary process.  I have placed a referral for cardiology.  Her symptoms do sound similar to reflux in their character so I have prescribed her an antiacid medication. Will discharge patient to home. All questions answered. Patient comfortable with plan of discharge. Return precautions discussed with patient and specified on the after visit summary.  [WS]    Clinical Course User Index [WS] Lonell Grandchild, MD   {   Click here for ABCD2, HEART and other calculatorsREFRESH Note before signing :1}                              Medical Decision Making Amount and/or Complexity of Data Reviewed Labs: ordered. Radiology: ordered.  Risk OTC drugs. Prescription drug management.   ***  {Document critical care time when appropriate:1} {Document review of labs and clinical decision tools ie heart score, Chads2Vasc2 etc:1}  {Document your independent review of radiology images, and any outside  records:1} {Document your discussion with family members, caretakers, and with consultants:1} {Document social determinants of health affecting pt's care:1} {Document your decision making why or why not admission, treatments were needed:1} Final Clinical Impression(s) / ED Diagnoses Final diagnoses:  None    Rx / DC Orders ED Discharge Orders     None

## 2023-05-31 NOTE — ED Triage Notes (Signed)
Patient BIB EMS from home for chest pain; woke up out of sleep. Pinching sensation in her chest, ShOB, L chest area.  Recently dx with a blood clot in her heart, and is on Plavix.  160 SBP, no active chest pain currently.

## 2023-05-31 NOTE — Discharge Instructions (Addendum)
We evaluated you for your chest pain.  We obtained a CT scan of your chest which did not show any dangerous problems with your aorta, the major blood vessel in your chest.  Your cardiac enzyme testing was negative.  We do not think you are having a heart attack right now.  The other possible cause of your chest pain is reflux.  We have prescribed you a antiacid medication to try to see if this helps with your symptoms.  Although we think your symptoms are not due to a heart condition, we have still placed a referral to a cardiologist.  They should call you within the next 48 business hours.  If you do not hear back you can call them on the number provided.  Please return if you have any new or worsening symptoms such as severe pain, difficulty breathing, lightheadedness or dizziness, fainting, nausea or vomiting, or any other new symptoms.

## 2023-06-02 ENCOUNTER — Emergency Department (HOSPITAL_COMMUNITY)
Admission: EM | Admit: 2023-06-02 | Discharge: 2023-06-02 | Disposition: A | Discharge status: Home / Self Care | Payer: Medicare Other | Attending: Emergency Medicine | Admitting: Emergency Medicine

## 2023-06-02 ENCOUNTER — Other Ambulatory Visit: Payer: Self-pay

## 2023-06-02 DIAGNOSIS — Z79899 Other long term (current) drug therapy: Secondary | ICD-10-CM | POA: Diagnosis not present

## 2023-06-02 DIAGNOSIS — I10 Essential (primary) hypertension: Secondary | ICD-10-CM | POA: Insufficient documentation

## 2023-06-02 DIAGNOSIS — Z7951 Long term (current) use of inhaled steroids: Secondary | ICD-10-CM | POA: Diagnosis not present

## 2023-06-02 DIAGNOSIS — J449 Chronic obstructive pulmonary disease, unspecified: Secondary | ICD-10-CM | POA: Diagnosis not present

## 2023-06-02 DIAGNOSIS — R079 Chest pain, unspecified: Secondary | ICD-10-CM | POA: Diagnosis present

## 2023-06-02 NOTE — ED Provider Notes (Signed)
[place] EM[facility]GENCY DEPARTMENT AT [facility] Provider Note   CSN: [id number] Arrival date & time: 06/02/23  7253     History  Chief Complaint  Patient presents with   Hypertension    [place] is a 87 y.o. female.  The history is provided by the patient and medical records. No language interpreter was used.  Hypertension     87 year old female significant history of hypertension, hyperlipidemia, GERD, COPD, brought here via EMS from home with concerns of elevated blood pressure.  Patient reports she was seen in the ED several days ago for chest pain.  Was thought related to heartburn and patient was discharged home with pantoprazole hydrochloride.  Patient states after taking this medication she noticed that her blood pressure has been rising as high as greater than 200 systolic.  She also complaining of throbbing headache.  She took her medication yesterday but her blood pressure still remains high which concerns her.  She felt that her new medication that was prescribed a couple days ago may have contributed to because "I cannot take salt and the medication has salt in it".  Today she checked her blood pressure and it was greater than 200s and therefore she called EMS to bring her here.  She mention her headache has improved.  She denies any focal numbness or focal weakness, vision changes fever chills or rash.  She denies any neck stiffness.  No active chest pain.  Home Medications Prior to Admission medications   Medication Sig Start Date End Date Taking? Authorizing Provider  albuterol (PROVENTIL) (2.5 MG/3ML) 0.083% nebulizer solution Take 3 mLs (2.5 mg total) by nebulization 4 (four) times daily. Patient not taking: Reported on 05/31/2023 10/03/19   Glenford Bayley, NP  albuterol (VENTOLIN HFA) 108 (90 Base) MCG/ACT inhaler INHALE TWO PUFFS BY MOUTH INTO LUNGS EVERY SIX HOURS AS NEEDED FOR WHEEZING AND/OR SHORTNESS OF BREATH 11/29/22   Leslye Peer, MD   Alpha-D-Galactosidase (BEANO PO) Take 1 capsule by mouth as needed (for gas and bloating).     [provider]  atorvastatin (LIPITOR) 40 MG tablet Take 1 tablet (40 mg total) by mouth daily. 05/30/23   Glean Salvo, NP  cholecalciferol (VITAMIN D3) 25 MCG (1000 UNIT) tablet Take 1,000 Units by mouth daily.    [provider]  clopidogrel (PLAVIX) 75 MG tablet Take 1 tablet (75 mg total) by mouth daily. 04/30/23   Meryl Dare, MD  cycloSPORINE (RESTASIS) 0.05 % ophthalmic emulsion Place 1 drop into both eyes 2 (two) times daily.    [provider]  EPINEPHrine 0.3 mg/0.3 mL IJ SOAJ injection Inject 0.3 mg into the muscle once as needed (for a severe allergic reaction/use as directed). 02/01/18   [provider]  hydrochlorothiazide (HYDRODIURIL) 12.5 MG tablet Take 12.5 mg by mouth daily.    [provider]  loratadine (CLARITIN) 10 MG tablet Take 1 tablet (10 mg total) by mouth daily. Patient taking differently: Take 10 mg by mouth daily as needed. 04/21/22   Glenford Bayley, NP  losartan (COZAAR) 50 MG tablet Take 50 mg by mouth daily.    [provider]  Multiple Vitamin (MULTIVITAMIN) tablet Take 1 tablet by mouth daily.     [provider]  pantoprazole (PROTONIX) 40 MG tablet Take 1 tablet (40 mg total) by mouth daily. 05/31/23   Lonell Grandchild, MD  Probiotic Product (PROBIOTIC PO) Take 1 capsule by mouth daily.     [provider]  Propylene Glycol (SYSTANE BALANCE) 0.6 % SOLN Place 1 drop into both eyes 4 (four) times daily as needed (dry eyes). Patient not taking: Reported on 05/31/2023 04/29/23   Celine Mans, MD  Respiratory Therapy Supplies (FLUTTER) DEVI Use device 2-3 times a day to break up congestion 07/17/18   Glenford Bayley, NP  Spacer/Aero-Holding Chambers (AEROCHAMBER MV) inhaler Use as instructed 11/08/22   Leslye Peer, MD  SYMBICORT 80-4.5 MCG/ACT inhaler Inhale 2 puffs into the lungs  in the morning and at bedtime. 11/08/22   Leslye Peer, MD      Allergies    Cepacol, Contrast media [iodinated contrast media], Iodine, Lactose intolerance (gi), Mushroom extract complex (do not select), Other, Penicillins, Sulfamethoxazole, Aspirin, Ciprofloxacin, and Budesonide    Review of Systems   Review of Systems  All other systems reviewed and are negative.   Physical Exam Updated Vital Signs BP (!) 158/70   Pulse 64   Resp 18   SpO2 100%  Physical Exam Vitals and nursing note reviewed.  Constitutional:      General: She is not in acute distress.    Appearance: She is well-developed.  HENT:     Head: Normocephalic and atraumatic.  Eyes:     Extraocular Movements: Extraocular movements intact.     Conjunctiva/sclera: Conjunctivae normal.     Pupils: Pupils are equal, round, and reactive to light.  Cardiovascular:     Rate and Rhythm: Normal rate and regular rhythm.     Pulses: Normal pulses.     Heart sounds: Normal heart sounds.  Pulmonary:     Effort: Pulmonary effort is normal.  Abdominal:     Palpations: Abdomen is soft.  Musculoskeletal:        General: Normal range of motion.     Cervical back: Normal range of motion and neck supple. No rigidity.     Comments: 5 out of 5 strength to all 4 extremities  Skin:    Findings: No rash.  Neurological:     Mental Status: She is alert and oriented to person, place, and time.  Psychiatric:        Mood and Affect: Mood normal.     ED Results / Procedures / Treatments   Labs (all labs ordered are listed, but only abnormal results are displayed) Labs Reviewed - No data to display  EKG None  Radiology CT Angio Chest/Abd/Pel for Dissection W and/or W/WO Result Date: 05/31/2023 CLINICAL DATA:  Chest pain and shortness of breath. EXAM: CT ANGIOGRAPHY CHEST, ABDOMEN AND PELVIS TECHNIQUE: Non-contrast CT of the chest was initially obtained. Multidetector CT imaging through the chest, abdomen and pelvis was  performed using the standard protocol during bolus administration of intravenous contrast. Multiplanar reconstructed images and MIPs were obtained and reviewed to evaluate the vascular anatomy. RADIATION DOSE REDUCTION: This exam was performed according to the departmental dose-optimization program which includes automated exposure control, adjustment of the mA and/or kV according to patient size and/or use of iterative reconstruction technique. CONTRAST:  OMNIPAQUE IOHEXOL 350 MG/ML SOLN COMPARISON:  MRI abdomen dated August 09, 2020. CT chest dated September 08, 2006. FINDINGS: CTA CHEST FINDINGS Cardiovascular: No evidence of aortic intramural hematoma on noncontrast imaging. No evidence of thoracic aortic aneurysm or dissection. Coronary, aortic arch, and branch vessel atherosclerotic vascular disease. Normal heart size. No pericardial effusion. No central pulmonary embolism. Mediastinum/Nodes: No enlarged mediastinal, hilar, or axillary lymph nodes. Thyroid gland, trachea, and esophagus demonstrate no significant findings.  Lungs/Pleura: No focal consolidation, pleural effusion, or pneumothorax. Musculoskeletal: No chest wall abnormality. No acute or significant osseous findings. Review of the MIP images confirms the above findings. CTA ABDOMEN AND PELVIS FINDINGS VASCULAR Aorta: Normal caliber aorta without aneurysm, dissection, vasculitis or significant stenosis. Atherosclerotic calcification. Celiac: Patent without evidence of aneurysm, dissection, vasculitis or significant stenosis. SMA: Patent without evidence of aneurysm, dissection, vasculitis or significant stenosis. Renals: Both renal arteries are patent without evidence of aneurysm, dissection, vasculitis, fibromuscular dysplasia or significant stenosis. IMA: Patent without evidence of aneurysm, dissection, vasculitis or significant stenosis. Inflow: Patent without evidence of aneurysm, dissection, vasculitis or significant stenosis. Veins: No  obvious venous abnormality within the limitations of this arterial phase study. Review of the MIP images confirms the above findings. NON-VASCULAR Hepatobiliary: No focal liver abnormality is seen. Status post cholecystectomy. No biliary dilatation. Pancreas: Unremarkable. No pancreatic ductal dilatation or surrounding inflammatory changes. Spleen: Normal in size without focal abnormality. Adrenals/Urinary Tract: Adrenal glands are unremarkable. Kidneys are normal, without renal calculi, solid lesion, or hydronephrosis. Bladder is unremarkable. Stomach/Bowel: Small hiatal hernia. The stomach is otherwise within normal limits. No bowel wall thickening, distention, or surrounding inflammatory change. Scattered colonic diverticulosis. Normal appendix. Lymphatic: No enlarged abdominal or pelvic lymph nodes. Reproductive: Uterus and bilateral adnexa are unremarkable. Other: No abdominal wall hernia or abnormality. No abdominopelvic ascites. No pneumoperitoneum. Musculoskeletal: No acute or significant osseous findings. Review of the MIP images confirms the above findings. IMPRESSION: 1. No aortic dissection or aneurysm. 2. No acute intrathoracic or intra-abdominal process. 3.  Aortic Atherosclerosis (ICD10-I70.0). Electronically Signed   By: Obie Dredge M.D.   On: 05/31/2023 13:42    Procedures Procedures    Medications Ordered in ED Medications - No data to display  ED Course/ Medical Decision Making/ A&P                                 Medical Decision Making  BP 131/79   Pulse 73   Resp 14   SpO2 100%   85:80 AM  87 year old female significant history of hypertension, hyperlipidemia, GERD, COPD, brought here via EMS from home with concerns of elevated blood pressure.  Patient reports she was seen in the ED several days ago for chest pain.  Was thought related to heartburn and patient was discharged home with pantoprazole hydrochloride.  Patient states after taking this medication she noticed  that her blood pressure has been rising as high as greater than 200 systolic.  She also complaining of throbbing headache.  She took her medication yesterday but her blood pressure still remains high which concerns her.  She felt that her new medication that was prescribed a couple days ago may have contributed to because "I cannot take salt and the medication has salt in it".  Today she checked her blood pressure and it was greater than 200s and therefore she called EMS to bring her here.  She mention her headache has improved.  She denies any focal numbness or focal weakness, vision changes fever chills or rash.  She denies any neck stiffness.  No active chest pain.  Exam overall reassuring patient is mentating appropriately answering all questions appropriately.  She has no focal neurodeficit on exam.  She does not have any nuchal rigidity either.  Currently her vital signs is normal and stable.  Her current blood pressure is 131/79.  Headache is unlikely to be meningitis, hemorrhagic stroke, or subarachnoid  hemorrhage.  Doubt space-occupying lesion.  No red flags.  She does have known history of hypertension.  Suspect elevated blood pressure is likely secondary to stress-induced.  With normal stable blood pressure at this time, I felt patient could be discharged and resume her medication.  I have low suspicion that pantoprazole is causing her elevated blood pressure.  Care discussed with Dr. Madilyn Hook        Final Clinical Impression(s) / ED Diagnoses Final diagnoses:  Hypertension, unspecified type    Rx / DC Orders ED Discharge Orders     None         Fayrene Helper, PA-C 06/02/23 0606    Tilden Fossa, MD 06/02/23 [phone]

## 2023-06-02 NOTE — ED Triage Notes (Signed)
Pt arrived from home via [facility] c/o HTN. Pt states that she thinks it is from the medication that she was RX , on Wed for GERD.  Because it has Na+  in it and she feels that, that is making her swell.

## 2023-06-02 NOTE — Discharge Instructions (Signed)
Although your blood pressure was elevated earlier, it has normalized.  You may resume your blood pressure medication as usual and follow-up closely with your doctor for further care.  You may hold off on taking pantoprazole if you are concerned that it may affect your blood pressure.

## 2023-06-02 NOTE — Progress Notes (Signed)
I agree with the above plan 

## 2023-06-02 NOTE — ED Notes (Signed)
Pt ambulated to the bathroom.  

## 2023-06-06 DIAGNOSIS — R002 Palpitations: Secondary | ICD-10-CM | POA: Diagnosis not present

## 2023-06-06 DIAGNOSIS — G459 Transient cerebral ischemic attack, unspecified: Secondary | ICD-10-CM | POA: Diagnosis not present

## 2023-06-06 NOTE — ED Provider Notes (Signed)
[place] EMERGENCY DEPARTMENT AT [facility] Provider Note   CSN: [id number] Arrival date & time: 05/31/23  4782     History  Chief Complaint  Patient presents with   Chest Pain    [name] is a 87 y.o. female.  87 year old female who presents ER today with chest pain.  She states it woke her up out of sleep was pinching [place] shortness of breath mostly in the left chest.  She states she was recently diagnosed with a clot in her heart.  She is unsure what that means with a solid on ultrasound.  Currently symptom-free.  She has some associated shortness of breath.  She states she did eat a hour prior to laying down and then the symptoms came on 1 hour after she been sleeping.  She has a history of multiple workups in the past showing some coronary artery calcification but no occlusions.  On review of the records she has an ultrasound that showed a type IV severe aortic plaque.   Chest Pain      Home Medications Prior to Admission medications   Medication Sig Start Date End Date Taking? Authorizing Provider  albuterol (VENTOLIN HFA) 108 (90 Base) MCG/ACT inhaler INHALE TWO PUFFS BY MOUTH INTO LUNGS EVERY SIX HOURS AS NEEDED FOR WHEEZING AND/OR SHORTNESS OF BREATH 11/29/22  Yes Byrum, Les Pou, MD  Alpha-D-Galactosidase (BEANO PO) Take 1 capsule by mouth as needed (for gas and bloating).    Yes [provider]  atorvastatin (LIPITOR) 40 MG tablet Take 1 tablet (40 mg total) by mouth daily. 05/30/23  Yes Glean Salvo, NP  cholecalciferol (VITAMIN D3) 25 MCG (1000 UNIT) tablet Take 1,000 Units by mouth daily.   Yes [provider]  clopidogrel (PLAVIX) 75 MG tablet Take 1 tablet (75 mg total) by mouth daily. 04/30/23  Yes Meryl Dare, MD  cycloSPORINE (RESTASIS) 0.05 % ophthalmic emulsion Place 1 drop into both eyes 2 (two) times daily.   Yes [provider]  EPINEPHrine 0.3 mg/0.3 mL IJ SOAJ injection Inject 0.3 mg into the muscle  once as needed (for a severe allergic reaction/use as directed). 02/01/18  Yes [provider]  hydrochlorothiazide (HYDRODIURIL) 12.5 MG tablet Take 12.5 mg by mouth daily.   Yes [provider]  loratadine (CLARITIN) 10 MG tablet Take 1 tablet (10 mg total) by mouth daily. Patient taking differently: Take 10 mg by mouth daily as needed. 04/21/22  Yes Glenford Bayley, NP  losartan (COZAAR) 50 MG tablet Take 50 mg by mouth daily.   Yes [provider]  Multiple Vitamin (MULTIVITAMIN) tablet Take 1 tablet by mouth daily.    Yes [provider]  pantoprazole (PROTONIX) 40 MG tablet Take 1 tablet (40 mg total) by mouth daily. 05/31/23  Yes Lonell Grandchild, MD  Probiotic Product (PROBIOTIC PO) Take 1 capsule by mouth daily.    Yes [provider]  Respiratory Therapy Supplies (FLUTTER) DEVI Use device 2-3 times a day to break up congestion 07/17/18  Yes Glenford Bayley, NP  Spacer/Aero-Holding Chambers (AEROCHAMBER MV) inhaler Use as instructed 11/08/22  Yes Leslye Peer, MD  SYMBICORT 80-4.5 MCG/ACT inhaler Inhale 2 puffs into the lungs in the morning and at bedtime. 11/08/22  Yes Leslye Peer, MD  albuterol (PROVENTIL) (2.5 MG/3ML) 0.083% nebulizer solution Take 3 mLs (2.5 mg total) by nebulization 4 (four) times daily. Patient not taking: Reported on 05/31/2023 10/03/19   Glenford Bayley, NP  Propylene Glycol (SYSTANE BALANCE) 0.6 % SOLN Place 1 drop into both eyes 4 (four) times daily as needed (dry eyes). Patient not taking: Reported on 05/31/2023 04/29/23   Celine Mans, MD      Allergies    Cepacol, Contrast media [iodinated contrast media], Iodine, Lactose intolerance (gi), Mushroom extract complex (do not select), Other, Penicillins, Sulfamethoxazole, Aspirin, Ciprofloxacin, and Budesonide    Review of Systems   Review of Systems  Cardiovascular:  Positive for chest pain.    Physical Exam Updated Vital Signs BP (!) 146/65    Pulse 86   Temp 98.9 F (37.2 C) (Oral)   Resp 19   Ht 4\' 8"  (1.422 m)   Wt 59 kg   SpO2 100%   BMI 29.15 kg/m  Physical Exam Vitals and nursing note reviewed.  Constitutional:      Appearance: She is well-developed.  HENT:     Head: Normocephalic and atraumatic.  Cardiovascular:     Rate and Rhythm: Normal rate and regular rhythm.  Pulmonary:     Effort: No respiratory distress.     Breath sounds: No stridor.  Abdominal:     General: There is no distension.  Musculoskeletal:     Cervical back: Normal range of motion.  Neurological:     Mental Status: She is alert.     ED Results / Procedures / Treatments   Labs (all labs ordered are listed, but only abnormal results are displayed) Labs Reviewed  CBC WITH DIFFERENTIAL/PLATELET - Abnormal; Notable for the following components:      Result Value   WBC 3.4 (*)    All other components within normal limits  COMPREHENSIVE METABOLIC PANEL - Abnormal; Notable for the following components:   Total Bilirubin 1.4 (*)    GFR, Estimated 57 (*)    All other components within normal limits  LIPASE, BLOOD  TROPONIN I (HIGH SENSITIVITY)  TROPONIN I (HIGH SENSITIVITY)    EKG EKG Interpretation Date/Time:  Thursday May 31 2023 05:35:54 EST Ventricular Rate:  61 PR Interval:  170 QRS Duration:  86 QT Interval:  412 QTC Calculation: 415 R Axis:   64  Text Interpretation: Sinus rhythm Confirmed by Alvino Blood ([id number]) on 05/31/2023 9:29:18 AM  Radiology No results found.  Procedures Procedures    Medications Ordered in ED Medications  alum & mag hydroxide-simeth (MAALOX/MYLANTA) 200-200-20 MG/5ML suspension 30 mL (30 mLs Oral Given 05/31/23 0626)    And  lidocaine (XYLOCAINE) 2 % viscous mouth solution 15 mL (15 mLs Oral Given 05/31/23 0626)  methylPREDNISolone sodium succinate (SOLU-MEDROL) 40 mg/mL injection 40 mg (40 mg Intravenous Given 05/31/23 0839)  diphenhydrAMINE (BENADRYL) capsule 50 mg (50 mg  Oral Given 05/31/23 1116)    Or  diphenhydrAMINE (BENADRYL) injection 50 mg ( Intravenous See Alternative 05/31/23 1116)  iohexol (OMNIPAQUE) 350 MG/ML injection 100 mL (100 mLs Intravenous Contrast Given 05/31/23 1237)    ED Course/ Medical Decision Making/ A&P Clinical Course as of 06/06/23 8295  Thu May 31, 2023  0738 Received sign out from Dr. Clayborne Dana. Presenting with burning chest pain. CXR and troponin are reassuring. Had TIA one month ago. Had incidental aortic plaque during TIA workup.  [WS]  1447 Patient reports that the Maalox did not fact improve her symptoms.  CT scan was obtained after premedication.  She had no reaction to contrast.  Her CT scan did not show any evidence of aortic plaque.  Suspect previous echocardiography finding was an artifact.  She does have coronary  artery calcifications.  Her troponin is negative x 2.  Overall very low concern for any acute coronary process.  I have placed a referral for cardiology.  Her symptoms do sound similar to reflux in their character so I have prescribed her an antiacid medication. Will discharge patient to home. All questions answered. Patient comfortable with plan of discharge. Return precautions discussed with patient and specified on the after visit summary.  [WS]    Clinical Course User Index [WS] Lonell Grandchild, MD                                 Medical Decision Making Amount and/or Complexity of Data Reviewed Labs: ordered. Radiology: ordered.  Risk OTC drugs. Prescription drug management.   Overall patient appears well with a reassuring/baseline EKG and reassuring/normal troponins.  Suspect likely GI cause like reflux, gastritis or ulcer.  After discussion with the patient I recommend getting a CT angio of her aorta to evaluate any kind abnormalities in her aorta as if she did have a plaque there theoretically could lead to some symptoms with it or may need to be treated.  She has a contrast allergy it is unclear  what it was not even clear if it was related to CT contrast or not.  After long discussion with her with the risks and benefits decided to proceed with premedication for CT angio of the chest abdomen pelvis to evaluate her aorta.  Care transferred pending premedication and CT scan results with reevaluation.   Final Clinical Impression(s) / ED Diagnoses Final diagnoses:  Atypical chest pain    Rx / DC Orders ED Discharge Orders          Ordered    Ambulatory referral to Cardiology       Comments: If you have not heard from the Cardiology office within the next 72 hours please call [phone].   05/31/23 1444    pantoprazole (PROTONIX) 40 MG tablet  Daily        05/31/23 1444              Mabell Esguerra, Barbara Cower, MD 06/06/23 [phone]

## 2023-06-06 NOTE — ED Provider Notes (Incomplete)
[place] EMERGENCY DEPARTMENT AT [facility] Provider Note   CSN: [id number] Arrival date & time: 05/31/23  5284     History {Add pertinent medical, surgical, social history, OB history to HPI:1} Chief Complaint  Patient presents with   Chest Pain    [name] is a 87 y.o. female.   Chest Pain      Home Medications Prior to Admission medications   Medication Sig Start Date End Date Taking? Authorizing Provider  albuterol (VENTOLIN HFA) 108 (90 Base) MCG/ACT inhaler INHALE TWO PUFFS BY MOUTH INTO LUNGS EVERY SIX HOURS AS NEEDED FOR WHEEZING AND/OR SHORTNESS OF BREATH 11/29/22  Yes Byrum, Les Pou, MD  Alpha-D-Galactosidase (BEANO PO) Take 1 capsule by mouth as needed (for gas and bloating).    Yes [provider]  atorvastatin (LIPITOR) 40 MG tablet Take 1 tablet (40 mg total) by mouth daily. 05/30/23  Yes Glean Salvo, NP  cholecalciferol (VITAMIN D3) 25 MCG (1000 UNIT) tablet Take 1,000 Units by mouth daily.   Yes [provider]  clopidogrel (PLAVIX) 75 MG tablet Take 1 tablet (75 mg total) by mouth daily. 04/30/23  Yes Meryl Dare, MD  cycloSPORINE (RESTASIS) 0.05 % ophthalmic emulsion Place 1 drop into both eyes 2 (two) times daily.   Yes [provider]  EPINEPHrine 0.3 mg/0.3 mL IJ SOAJ injection Inject 0.3 mg into the muscle once as needed (for a severe allergic reaction/use as directed). 02/01/18  Yes [provider]  hydrochlorothiazide (HYDRODIURIL) 12.5 MG tablet Take 12.5 mg by mouth daily.   Yes [provider]  loratadine (CLARITIN) 10 MG tablet Take 1 tablet (10 mg total) by mouth daily. Patient taking differently: Take 10 mg by mouth daily as needed. 04/21/22  Yes Glenford Bayley, NP  losartan (COZAAR) 50 MG tablet Take 50 mg by mouth daily.   Yes [provider]  Multiple Vitamin (MULTIVITAMIN) tablet Take 1 tablet by mouth daily.    Yes [provider]  pantoprazole  (PROTONIX) 40 MG tablet Take 1 tablet (40 mg total) by mouth daily. 05/31/23  Yes Lonell Grandchild, MD  Probiotic Product (PROBIOTIC PO) Take 1 capsule by mouth daily.    Yes [provider]  Respiratory Therapy Supplies (FLUTTER) DEVI Use device 2-3 times a day to break up congestion 07/17/18  Yes Glenford Bayley, NP  Spacer/Aero-Holding Chambers (AEROCHAMBER MV) inhaler Use as instructed 11/08/22  Yes Leslye Peer, MD  SYMBICORT 80-4.5 MCG/ACT inhaler Inhale 2 puffs into the lungs in the morning and at bedtime. 11/08/22  Yes Leslye Peer, MD  albuterol (PROVENTIL) (2.5 MG/3ML) 0.083% nebulizer solution Take 3 mLs (2.5 mg total) by nebulization 4 (four) times daily. Patient not taking: Reported on 05/31/2023 10/03/19   Glenford Bayley, NP  Propylene Glycol (SYSTANE BALANCE) 0.6 % SOLN Place 1 drop into both eyes 4 (four) times daily as needed (dry eyes). Patient not taking: Reported on 05/31/2023 04/29/23   Celine Mans, MD      Allergies    Cepacol, Contrast media [iodinated contrast media], Iodine, Lactose intolerance (gi), Mushroom extract complex (do not select), Other, Penicillins, Sulfamethoxazole, Aspirin, Ciprofloxacin, and Budesonide    Review of Systems   Review of Systems  Cardiovascular:  Positive for chest pain.    Physical Exam Updated Vital Signs BP (!) 146/65   Pulse 86   Temp 98.9 F (37.2 C) (Oral)   Resp 19   Ht 4\' 8"  (1.422 m)  Wt 59 kg   SpO2 100%   BMI 29.15 kg/m  Physical Exam  ED Results / Procedures / Treatments   Labs (all labs ordered are listed, but only abnormal results are displayed) Labs Reviewed  CBC WITH DIFFERENTIAL/PLATELET - Abnormal; Notable for the following components:      Result Value   WBC 3.4 (*)    All other components within normal limits  COMPREHENSIVE METABOLIC PANEL - Abnormal; Notable for the following components:   Total Bilirubin 1.4 (*)    GFR, Estimated 57 (*)    All other components within  normal limits  LIPASE, BLOOD  TROPONIN I (HIGH SENSITIVITY)  TROPONIN I (HIGH SENSITIVITY)    EKG EKG Interpretation Date/Time:  Thursday May 31 2023 05:35:54 EST Ventricular Rate:  61 PR Interval:  170 QRS Duration:  86 QT Interval:  412 QTC Calculation: 415 R Axis:   64  Text Interpretation: Sinus rhythm Confirmed by Alvino Blood ([id number]) on 05/31/2023 9:29:18 AM  Radiology No results found.  Procedures Procedures  {Document cardiac monitor, telemetry assessment procedure when appropriate:1}  Medications Ordered in ED Medications  alum & mag hydroxide-simeth (MAALOX/MYLANTA) 200-200-20 MG/5ML suspension 30 mL (30 mLs Oral Given 05/31/23 0626)    And  lidocaine (XYLOCAINE) 2 % viscous mouth solution 15 mL (15 mLs Oral Given 05/31/23 0626)  methylPREDNISolone sodium succinate (SOLU-MEDROL) 40 mg/mL injection 40 mg (40 mg Intravenous Given 05/31/23 0839)  diphenhydrAMINE (BENADRYL) capsule 50 mg (50 mg Oral Given 05/31/23 1116)    Or  diphenhydrAMINE (BENADRYL) injection 50 mg ( Intravenous See Alternative 05/31/23 1116)  iohexol (OMNIPAQUE) 350 MG/ML injection 100 mL (100 mLs Intravenous Contrast Given 05/31/23 1237)    ED Course/ Medical Decision Making/ A&P Clinical Course as of 06/06/23 0704  Thu May 31, 2023  0738 Received sign out from Dr. Clayborne Dana. Presenting with burning chest pain. CXR and troponin are reassuring. Had TIA one month ago. Had incidental aortic plaque during TIA workup.  [WS]  1447 Patient reports that the Maalox did not fact improve her symptoms.  CT scan was obtained after premedication.  She had no reaction to contrast.  Her CT scan did not show any evidence of aortic plaque.  Suspect previous echocardiography finding was an artifact.  She does have coronary artery calcifications.  Her troponin is negative x 2.  Overall very low concern for any acute coronary process.  I have placed a referral for cardiology.  Her symptoms do sound similar to  reflux in their character so I have prescribed her an antiacid medication. Will discharge patient to home. All questions answered. Patient comfortable with plan of discharge. Return precautions discussed with patient and specified on the after visit summary.  [WS]    Clinical Course User Index [WS] Lonell Grandchild, MD   {   Click here for ABCD2, HEART and other calculatorsREFRESH Note before signing :1}                              Medical Decision Making Amount and/or Complexity of Data Reviewed Labs: ordered. Radiology: ordered.  Risk OTC drugs. Prescription drug management.   ***  {Document critical care time when appropriate:1} {Document review of labs and clinical decision tools ie heart score, Chads2Vasc2 etc:1}  {Document your independent review of radiology images, and any outside records:1} {Document your discussion with family members, caretakers, and with consultants:1} {Document social determinants of health affecting pt's care:1} {Document your  decision making why or why not admission, treatments were needed:1} Final Clinical Impression(s) / ED Diagnoses Final diagnoses:  Atypical chest pain    Rx / DC Orders ED Discharge Orders          Ordered    Ambulatory referral to Cardiology       Comments: If you have not heard from the Cardiology office within the next 72 hours please call [phone].   05/31/23 1444    pantoprazole (PROTONIX) 40 MG tablet  Daily        05/31/23 1444

## 2023-06-28 ENCOUNTER — Ambulatory Visit: Payer: Medicare Other | Admitting: Podiatry

## 2023-07-17 ENCOUNTER — Ambulatory Visit: Payer: Medicare Other | Attending: Neurology

## 2023-07-17 DIAGNOSIS — G459 Transient cerebral ischemic attack, unspecified: Secondary | ICD-10-CM

## 2023-07-17 DIAGNOSIS — R002 Palpitations: Secondary | ICD-10-CM

## 2023-07-18 ENCOUNTER — Encounter: Payer: Self-pay | Admitting: Neurology

## 2023-08-01 ENCOUNTER — Encounter: Payer: Self-pay | Admitting: Cardiovascular Disease

## 2023-08-01 ENCOUNTER — Ambulatory Visit: Payer: Medicare Other | Attending: Cardiovascular Disease | Admitting: Cardiovascular Disease

## 2023-08-01 VITALS — BP 132/66 | HR 64 | Ht <= 58 in | Wt 129.8 lb

## 2023-08-01 DIAGNOSIS — G459 Transient cerebral ischemic attack, unspecified: Secondary | ICD-10-CM | POA: Diagnosis not present

## 2023-08-01 DIAGNOSIS — I10 Essential (primary) hypertension: Secondary | ICD-10-CM | POA: Diagnosis not present

## 2023-08-01 DIAGNOSIS — E782 Mixed hyperlipidemia: Secondary | ICD-10-CM

## 2023-08-01 NOTE — Patient Instructions (Signed)
Medication Instructions:  No changes.  *If you need a refill on your cardiac medications before your next appointment, please call your pharmacy*   Follow-Up: At [facility], you and your health needs are our priority.  As part of our continuing mission to provide you with exceptional heart care, we have created designated Provider Care Teams.  These Care Teams include your primary Cardiologist (physician) and Advanced Practice Providers (APPs -  Physician Assistants and Nurse Practitioners) who all work together to provide you with the care you need, when you need it.  We recommend signing up for the patient portal called "MyChart".  Sign up information is provided on this After Visit Summary.  MyChart is used to connect with patients for Virtual Visits (Telemedicine).  Patients are able to view lab/test results, encounter notes, upcoming appointments, etc.  Non-urgent messages can be sent to your provider as well.   To learn more about what you can do with MyChart, go to [email or web address].    Your next appointment:    As needed.   Provider:   Nanetta Batty, MD     Other Instructions

## 2023-08-01 NOTE — Assessment & Plan Note (Signed)
History of hyperlipidemia on statin therapy with lipid profile performed 07/19/2023 revealing total cholesterol 184, LDL 109 and HDL 62.

## 2023-08-01 NOTE — Assessment & Plan Note (Signed)
History of essential hypertension blood pressure measured today at 132/66.  She is on losartan and hydrochlorothiazide.

## 2023-08-01 NOTE — Assessment & Plan Note (Signed)
History of TIA during admission 04/27/2023.  MRI and MRA were negative for acute infarct.  Carotid Dopplers were normal.  2D echo showed no acute changes.  Subsequent 30-day event monitor showed PACs without evidence of A-fib.  No further workup necessary at this time.  If she has recurrent episodes she will we will need a loop recorder implanted.

## 2023-08-01 NOTE — Progress Notes (Signed)
08/01/2023 [name]   [birth year]  [id number]  Primary Physician No primary care provider on file. Primary Cardiologist: Runell Gess MD Roseanne Reno  HPI:  [name] is a 88 y.o. thin-appearing divorced African-American female mother of 1 special needs/autistic son who we are being asked to see because of recent TIA.  She had seen Dr. Antoine Poche back in 2019.  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  Risk factors include treated hypertension and hyperlipidemia.  She is never had a heart attack.  She has had a remote TIA back in 2015 and again 04/27/2023 with left-sided weakness and numbness.  MRA/MRI were negative.  Carotid Dopplers are normal.  2D echo was normal as well.  30-day event monitor only showed PACs but no A-fib.  She otherwise is active and denies chest pain or shortness of breath.   Current Meds  Medication Sig   albuterol (VENTOLIN HFA) 108 (90 Base) MCG/ACT inhaler INHALE TWO PUFFS BY MOUTH INTO LUNGS EVERY SIX HOURS AS NEEDED FOR WHEEZING AND/OR SHORTNESS OF BREATH   atorvastatin (LIPITOR) 40 MG tablet Take 1 tablet (40 mg total) by mouth daily.   cholecalciferol (VITAMIN D3) 25 MCG (1000 UNIT) tablet Take 1,000 Units by mouth daily.   clopidogrel (PLAVIX) 75 MG tablet Take 1 tablet (75 mg total) by mouth daily.   cycloSPORINE (RESTASIS) 0.05 % ophthalmic emulsion Place 1 drop into both eyes 2 (two) times daily.   EPINEPHrine 0.3 mg/0.3 mL IJ SOAJ injection Inject 0.3 mg into the muscle once as needed (for a severe allergic reaction/use as directed).   loratadine (CLARITIN) 10 MG tablet Take 1 tablet (10 mg total) by mouth daily. (Patient taking differently: Take 10 mg by mouth daily as needed.)   losartan (COZAAR) 50 MG tablet Take 50 mg by mouth daily.   Multiple Vitamin (MULTIVITAMIN) tablet Take 1 tablet by mouth daily.    pantoprazole (PROTONIX) 40 MG tablet Take 1 tablet (40 mg total) by mouth daily.    Probiotic Product (PROBIOTIC PO) Take 1 capsule by mouth daily.    Propylene Glycol (SYSTANE BALANCE) 0.6 % SOLN Place 1 drop into both eyes 4 (four) times daily as needed (dry eyes).   Respiratory Therapy Supplies (FLUTTER) DEVI Use device 2-3 times a day to break up congestion   Spacer/Aero-Holding Chambers (AEROCHAMBER MV) inhaler Use as instructed   SYMBICORT 80-4.5 MCG/ACT inhaler Inhale 2 puffs into the lungs in the morning and at bedtime.     Allergies  Allergen Reactions   Cepacol Shortness Of Breath    Difficulty breathing   Contrast Media [Iodinated Contrast Media] Shortness Of Breath and Swelling    Pt reports "I could not breath at all and my face swelled up"    Iodine Shortness Of Breath   Lactose Intolerance (Gi) Shortness Of Breath    All dairy  Causes shortness of breath   Mushroom Extract Complex (Obsolete) Shortness Of Breath   Other Hives, Shortness Of Breath and Swelling    Mushrooms (All mushrooms)   Penicillins Shortness Of Breath and Swelling    DID THE REACTION INVOLVE: Swelling of the face/tongue/throat, SOB, or low BP? Yes  Sudden or severe rash/hives, skin peeling, or the inside of the mouth or nose? No Did it require medical treatment? No When did it last happen? Over 10 years ago   If all above answers are "NO", may proceed with cephalosporin use.  Sulfamethoxazole Anaphylaxis   Aspirin Other (See Comments)    "Burns stomach up"   Ciprofloxacin     Other reaction(s): Unknown   Budesonide Other (See Comments)    Headache    Social History   Socioeconomic History   Marital status: Widowed    Spouse name: Not on file   Number of children: 1   Years of education: college   Highest education level: Not on file  Occupational History   Occupation: retired  Tobacco Use   Smoking status: Former    Current packs/day: 0.00    Average packs/day: 0.5 packs/day for 4.3 years (2.1 ttl pk-yrs)    Types: Cigarettes    Start date: 83    Quit date:  09/28/1959    Years since quitting: 63.8    Passive exposure: Never   Smokeless tobacco: Never  Vaping Use   Vaping status: Never Used  Substance and Sexual Activity   Alcohol use: No    Alcohol/week: 0.0 standard drinks of alcohol   Drug use: No   Sexual activity: Not Currently  Other Topics Concern   Not on file  Social History Narrative   Patient is widowed with one child.   Patient is right handed.   Patient has college education.   Patient does not drink caffeine.   Social Drivers of Corporate investment banker Strain: Not on file  Food Insecurity: Low Risk  (06/08/2023)   Received from [facility]   Hunger Vital Sign    Worried About Running Out of Food in the Last Year: Never true    Ran Out of Food in the Last Year: Never true  Recent Concern: Food Insecurity - Medium Risk (05/18/2023)   [facility]   Hunger Vital Sign    Worried About Running Out of Food in the Last Year: Patient declined to answer    Ran Out of Food in the Last Year: Sometimes true  Transportation Needs: No Transportation Needs (06/08/2023)   Received from [facility]    In the past 12 months, has lack of reliable transportation kept you from medical appointments, meetings, work or from getting things needed for daily living? : No  Physical Activity: Not on file  Stress: Not on file  Social Connections: Not on file  Intimate Partner Violence: Not At Risk (04/27/2023)   Humiliation, Afraid, Rape, and Kick questionnaire    Fear of Current or Ex-Partner: No    Emotionally Abused: No    Physically Abused: No    Sexually Abused: No     Review of Systems: General: negative for chills, fever, night sweats or weight changes.  Cardiovascular: negative for chest pain, dyspnea on exertion, edema, orthopnea, palpitations, paroxysmal nocturnal dyspnea or shortness of breath Dermatological: negative for rash Respiratory: negative for cough or  wheezing Urologic: negative for hematuria Abdominal: negative for nausea, vomiting, diarrhea, bright red blood per rectum, melena, or hematemesis Neurologic: negative for visual changes, syncope, or dizziness All other systems reviewed and are otherwise negative except as noted above.    Blood pressure 132/66, pulse 64, height 4\' 9"  (1.448 m), weight 129 lb 12.8 oz (58.9 kg), SpO2 98%.  General appearance: alert and no distress Neck: no adenopathy, no carotid bruit, no JVD, supple, symmetrical, trachea midline, and thyroid not enlarged, symmetric, no tenderness/mass/nodules Lungs: clear to auscultation bilaterally Heart: regular rate and rhythm, S1, S2 normal, no murmur, click, rub or gallop Extremities: extremities normal, atraumatic, no  cyanosis or edema Pulses: 2+ and symmetric Skin: Skin color, texture, turgor normal. No rashes or lesions Neurologic: Grossly normal  EKG EKG Interpretation Date/Time:  Wednesday August 01 2023 14:01:44 EST Ventricular Rate:  64 PR Interval:  152 QRS Duration:  68 QT Interval:  380 QTC Calculation: 392 R Axis:   26  Text Interpretation: Normal sinus rhythm Low voltage QRS Septal infarct , age undetermined When compared with ECG of 31-May-2023 05:35, PREVIOUS ECG IS PRESENT Confirmed by Nanetta Batty [phone]) on 08/01/2023 2:09:29 PM    ASSESSMENT AND PLAN:   TIA (transient ischemic attack) History of TIA during admission 04/27/2023.  MRI and MRA were negative for acute infarct.  Carotid Dopplers were normal.  2D echo showed no acute changes.  Subsequent 30-day event monitor showed PACs without evidence of A-fib.  No further workup necessary at this time.  If she has recurrent episodes she will we will need a loop recorder implanted.  HLD (hyperlipidemia) History of hyperlipidemia on statin therapy with lipid profile performed 07/19/2023 revealing total cholesterol 184, LDL 109 and HDL 62.  Essential hypertension History of essential  hypertension blood pressure measured today at 132/66.  She is on losartan and hydrochlorothiazide.     Runell Gess MD FACP,FACC,FAHA, [facility] 08/01/2023 2:23 PM

## 2023-08-02 ENCOUNTER — Encounter: Payer: Self-pay | Admitting: Podiatry

## 2023-08-02 ENCOUNTER — Ambulatory Visit: Payer: Medicare Other | Admitting: Podiatry

## 2023-08-02 VITALS — Ht <= 58 in | Wt 129.0 lb

## 2023-08-02 DIAGNOSIS — Z7901 Long term (current) use of anticoagulants: Secondary | ICD-10-CM | POA: Diagnosis not present

## 2023-08-02 DIAGNOSIS — M5432 Sciatica, left side: Secondary | ICD-10-CM

## 2023-08-02 DIAGNOSIS — L84 Corns and callosities: Secondary | ICD-10-CM | POA: Diagnosis not present

## 2023-08-02 DIAGNOSIS — L603 Nail dystrophy: Secondary | ICD-10-CM

## 2023-08-02 DIAGNOSIS — M5431 Sciatica, right side: Secondary | ICD-10-CM

## 2023-08-02 NOTE — Patient Instructions (Addendum)
Look for urea 40% cream or ointment and apply to the thickened dry skin / calluses. This can be bought over the counter, at a pharmacy or online such as [employer].  Can also scrub these areas with white vinegar

## 2023-08-02 NOTE — Progress Notes (Signed)
  Subjective:  Patient ID: [name], female    DOB: [birth year],  MRN: [id number]  Chief Complaint  Patient presents with   Nail Problem    " I think I have a callous on the 4th toe on the right foot that is sore, and I am on Plavix once a day and I was told I should not cut my feet due to being on a blood thinner, and I would like my nails trimmed as well"     88 y.o. female presents with the above complaint. History confirmed with patient. Patient presenting with pain related to dystrophic thickened elongated nails. Patient is unable to trim own nails related to nail dystrophy and/or mobility issues. Patient does not have a history of T2DM. She does have history of PAD and does report taking plavix. Patient does have callus present located at the right 4th toe, and under the 5th and 2nd met heads causing pain.  Also complaining numbness, tingling, shooting sensations bilaterally, starting after recent hospitalization a couple of months ago.  Objective:  Physical Exam: warm to cool, diminished capillary refill, pedal skin atrophic, pedal hair growth absent nail exam onychomycosis of the toenails protective sensation intact, DP and PT pulses very faint.  Patient does report subjective shooting and burning sensations with single-leg raise. Left Foot:  Pain with palpation of nails due to elongation and dystrophic growth.  Right Foot: Pain with palpation of nails due to elongation and dystrophic growth.  Right foot interdigital corn lateral aspect of right fourth toe, preulcerative callus right subfifth metatarsal head, subsecond metatarsal head. Assessment:   1. Corn of toe   2. Chronic anticoagulation   3. Nail dystrophy   4. Bilateral sciatica      Plan:  Patient was evaluated and treated and all questions answered.  #Hyperkeratotic lesions/pre ulcerative calluses present lateral right fourth toe, subfifth and subsecond metatarsal head right foot All symptomatic hyperkeratoses  x 3 separate lesions were safely debrided as a courtesy with a sterile #312 blade to patient's level of comfort without incident. We discussed preventative and palliative care of these lesions including supportive and accommodative shoegear, padding, prefabricated and custom molded accommodative orthoses, use of a pumice stone and lotions/creams daily.  #Onychomycosis with pain  -Nails palliatively debrided as below. 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 Instrumentation: Nail nipper, rotary burr. Number of Nails: 10  # Bilateral sciatica -Discussed that patients are consistent with sciatica versus lumbar radiculopathy. Can also be aggravated by decreased circulation -Information on sciatica dispensed the patient.  Advised stretching and increasing activity to alleviate symptoms.  Otherwise may need to see back specialist for evaluation.  Return in about 3 months (around 10/30/2023) for Routine Foot Care.         Bronwen Betters, DPM [facility]

## 2023-08-22 ENCOUNTER — Ambulatory Visit: Payer: Medicare Other | Admitting: Emergency Medicine

## 2023-09-11 ENCOUNTER — Encounter: Payer: Self-pay | Admitting: Family Medicine

## 2023-09-11 ENCOUNTER — Ambulatory Visit (INDEPENDENT_AMBULATORY_CARE_PROVIDER_SITE_OTHER): Admitting: Family Medicine

## 2023-09-11 VITALS — BP 124/76 | HR 79 | Temp 97.5°F | Ht 59.0 in | Wt 130.6 lb

## 2023-09-11 DIAGNOSIS — I10 Essential (primary) hypertension: Secondary | ICD-10-CM

## 2023-09-11 DIAGNOSIS — M5431 Sciatica, right side: Secondary | ICD-10-CM | POA: Insufficient documentation

## 2023-09-11 DIAGNOSIS — J45909 Unspecified asthma, uncomplicated: Secondary | ICD-10-CM

## 2023-09-11 DIAGNOSIS — Z8673 Personal history of transient ischemic attack (TIA), and cerebral infarction without residual deficits: Secondary | ICD-10-CM | POA: Diagnosis not present

## 2023-09-11 DIAGNOSIS — J309 Allergic rhinitis, unspecified: Secondary | ICD-10-CM

## 2023-09-11 DIAGNOSIS — M5432 Sciatica, left side: Secondary | ICD-10-CM

## 2023-09-11 DIAGNOSIS — D72819 Decreased white blood cell count, unspecified: Secondary | ICD-10-CM | POA: Insufficient documentation

## 2023-09-11 DIAGNOSIS — E782 Mixed hyperlipidemia: Secondary | ICD-10-CM

## 2023-09-11 DIAGNOSIS — D72818 Other decreased white blood cell count: Secondary | ICD-10-CM

## 2023-09-11 DIAGNOSIS — K222 Esophageal obstruction: Secondary | ICD-10-CM

## 2023-09-11 DIAGNOSIS — H16223 Keratoconjunctivitis sicca, not specified as Sjogren's, bilateral: Secondary | ICD-10-CM | POA: Insufficient documentation

## 2023-09-11 DIAGNOSIS — K219 Gastro-esophageal reflux disease without esophagitis: Secondary | ICD-10-CM

## 2023-09-11 DIAGNOSIS — G459 Transient cerebral ischemic attack, unspecified: Secondary | ICD-10-CM

## 2023-09-11 DIAGNOSIS — D649 Anemia, unspecified: Secondary | ICD-10-CM | POA: Insufficient documentation

## 2023-09-11 NOTE — Patient Instructions (Signed)
 VISIT SUMMARY:  [name], an 88 year old female, visited for her annual physical exam and to establish care with a new primary care provider. She has a history of asthma, TIA, aortic atherosclerosis, anemia, leukopenia, GERD, hypertension, seasonal allergies, dry eyes, and chronic back pain. She recently experienced pneumonia and bronchitis, which required hospitalization and led to an allergic reaction to medication. She is currently symptom-free and managing her conditions with various medications.  YOUR PLAN:  -ASTHMA: Asthma is a condition where your airways narrow and swell, making it difficult to breathe. You are managing it with Symbicort (84.5 mcg) two puffs twice daily and an albuterol inhaler as needed. Continue following up with Dr. Delton Coombes, your pulmonologist.  -TRANSIENT ISCHEMIC ATTACK (TIA): A TIA is a temporary period of symptoms similar to those of a stroke. You are taking clopidogrel 75 mg daily to prevent another TIA. Avoid aspirin due to your gastrointestinal issues and asthma. Continue following up with Dr. Allyson Sabal for your cardiac concerns.  -AORTIC ATHEROSCLEROSIS: Aortic atherosclerosis is the buildup of cholesterol in the aorta, which can restrict blood flow. You are taking atorvastatin 40 mg daily to manage this condition. We will monitor your liver function due to the statin use.  -ANEMIA AND LEUKOPENIA: Anemia is a condition where you lack enough healthy red blood cells, and leukopenia is a low white blood cell count. We will order a complete blood count (CBC) to monitor these conditions.  -GASTROESOPHAGEAL REFLUX DISEASE (GERD): GERD is a digestive disorder where stomach acid irritates the food pipe lining. You are managing it with pantoprazole 40 mg daily. Continue taking your medication as prescribed.  -HYPERTENSION: Hypertension is high blood pressure. You are taking losartan 50 mg daily to manage this condition. We will review your blood pressure readings during  your next visit.  -SEASONAL ALLERGIES: Seasonal allergies are allergic reactions that occur during certain times of the year. You are managing them with loratadine (Claritin) 10 mg as needed. Be cautious of allergic reactions to medications.  -DRY EYES: Dry eyes occur when your tears aren't able to provide adequate moisture. You are using cyclosporine 0.5% drops, one drop twice daily, to manage this condition.  -BACK PAIN: Chronic back pain can result from various causes, including past medical procedures. You are under the care of [facility] and have seen Dr. Althea Charon. You have chosen to avoid surgery at this time.  -GENERAL HEALTH MAINTENANCE: It's important to have a physical exam for preventive care and to manage chronic diseases. We will schedule your physical exam, review your medications and medical history, and address any gaps in your care.  INSTRUCTIONS:  Schedule a follow-up appointment in one month. Please complete lab work one week before the follow-up appointment, including tests for cholesterol, kidney function, electrolytes, thyroid, hemoglobin A1c, and liver function. Ensure you fast before the lab work. We will discuss the lab results during your follow-up appointment.

## 2023-09-11 NOTE — Progress Notes (Signed)
 Assessment/Plan:    Assessment & Plan Asthma [name]'s asthma is managed with Symbicort (84.5 mcg) two puffs twice daily and an albuterol inhaler as needed. She is under the care of Dr. Delton Coombes, a pulmonologist.  Transient Ischemic Attack (TIA) [name] is on clopidogrel 75 mg daily for TIA. She cannot take aspirin due to gastrointestinal issues and asthma. She is also under the care of Dr. Allyson Sabal for cardiac concerns.  Aortic Atherosclerosis [name]'s aortic atherosclerosis is managed with atorvastatin 40 mg daily. Plans to monitor her liver function due to statin use.  Anemia and Leukopenia [name] has anemia and leukopenia, with a low white blood cell count around 3.4 and fluctuating hemoglobin levels. - Order complete blood count (CBC) to monitor anemia and leukopenia.  Gastroesophageal Reflux Disease (GERD) [name]'s GERD is managed with pantoprazole 40 mg daily. She experiences heartburn and reflux symptoms.  Hypertension [name] is on losartan 50 mg daily for hypertension. No current blood pressure readings or symptoms were discussed.  Seasonal Allergies [name] experiences seasonal allergies and uses loratadine (Claritin) 10 mg as needed. She has a history of allergic reactions to medications.  Dry Eyes [name]'s dry eyes are managed with cyclosporine 0.5% drops, one drop twice daily.  Back Pain [name] has chronic back pain related to a past epidural procedure. She is under the care of [facility] and has seen Dr. Althea Charon, a sports medicine doctor, but wishes to avoid surgery.  General Health Maintenance [name] is a new patient seeking to establish care closer to home. She has not had her annual physical examination for the year and wishes to plan for it. Emphasized the importance of a physical exam for preventive care and chronic disease management. - Schedule a physical examination. - Review medications and medical history. - Address any gaps  in care during the physical examination.  Follow-up [name] prefers to discuss her lab results in person rather than receiving a phone call. Agreed to this approach and planned for lab work to be done prior to the follow-up appointment to allow for discussion of results. - Schedule follow-up appointment in one month. - Order lab work one week before the follow-up appointment, including cholesterol, kidney function, electrolytes, thyroid, hemoglobin A1c, and liver function tests. - Ensure lab work is fasting. - Discuss lab results during the follow-up appointment.      There are no discontinued medications.  Return in about 1 month (around 10/12/2023) for physical (fasting labs 1 week before appointment).    Subjective:   Encounter date: 09/11/2023  [name] is a 88 y.o. female who has Allergic rhinitis; Intrinsic asthma; GERD; CHOLECYSTECTOMY, HX OF; Family history of malignant neoplasm of gastrointestinal tract; History of colonic polyps; Stricture and stenosis of esophagus; TIA (transient ischemic attack); HLD (hyperlipidemia); Headache on top of head; Essential hypertension; Neck pain; Low back pain; Pleurisy; Dyslipidemia; Chest pain; Abnormal EKG; Myalgia; Thrush; Palpitations; Keratoconjunctivitis sicca of both eyes not due to Sjogren's syndrome; Bilateral sciatica; Anemia; and Leucopenia on their problem list..   She  has a past medical history of Arthritis, Asthma, Blood clotting disorder (HCC), Cataract, COPD (chronic obstructive pulmonary disease) (HCC), Depression, Diverticulosis (10-2011), GERD (gastroesophageal reflux disease), Hemorrhoids, internal, colonic polyp, Hyperlipemia, Hypertension, IBS (irritable bowel syndrome), Stricture of esophagus (10-2011), and TIA (transient ischemic attack)..   She presents with chief complaint of Establish Care (Due for physical . Non fasting.  Would like to discuss cologuard. Ongoing back pain was seen by sports medicine, but hasn't  been in awhile.) .  Discussed the use of AI scribe software for clinical note transcription with the patient, who gave verbal consent to proceed.  History of Present Illness [name] is an 88 year old female who presents for an annual physical exam.  She recently moved to the area and is seeking a new primary care provider for convenience and continuity of care. She has not had her yearly physical and wishes to establish care and review her medical history.  She has a recent history of pneumonia and bronchitis, which required hospitalization for a day and a half. During this time, she experienced an allergic reaction to over-the-counter medication, necessitating the use of an EpiPen and an urgent care visit. She was found to have cholesterol in the aorta and was started on medication, which also led to an allergic reaction. She has been struggling to regain her strength due to low white blood cell counts, which have been low for about two years, with a count of 3.4 in December 2024. Her hemoglobin was also low at 11.4 in November 2024 but improved to 12.3 by December 2024.  She has a history of asthma and is under the care of a pulmonologist. She uses Symbicort 84.5 mcg, two puffs twice daily, and has an albuterol inhaler for as-needed use. She also has an EpiPen for allergic reactions.  She has GERD and takes pantoprazole 40 mg daily. For hypertension, she takes losartan 50 mg daily.  She has a history of a TIA and palpitations, for which she saw a cardiologist. She is on clopidogrel 75 mg daily and atorvastatin 40 mg daily for cholesterol and aortic atherosclerosis. She does not take aspirin due to stomach issues and asthma.  She has a history of back issues following an epidural many years ago and is currently seeing specialists at [facility]. She has seen a sports medicine doctor and was referred to a surgeon but has not pursued surgery.  She experiences dry eyes  and uses cyclosporine drops 0.5%, one drop twice daily.  She manages seasonal allergies with loratadine 10 mg as needed. No current chest pain, shortness of breath, dizziness, confusion, weakness, wheezing, cough, runny nose, headache, or blurry vision. She reports having had these symptoms during her recent illness but is currently symptom-free.       Past Surgical History:  Procedure Laterality Date   CESAREAN SECTION  1962   LAPAROSCOPIC CHOLECYSTECTOMY  1995    Outpatient Medications Prior to Visit  Medication Sig Dispense Refill   albuterol (VENTOLIN HFA) 108 (90 Base) MCG/ACT inhaler INHALE TWO PUFFS BY MOUTH INTO LUNGS EVERY SIX HOURS AS NEEDED FOR WHEEZING AND/OR SHORTNESS OF BREATH 8.5 g 2   atorvastatin (LIPITOR) 40 MG tablet Take 1 tablet (40 mg total) by mouth daily. 30 tablet 0   cholecalciferol (VITAMIN D3) 25 MCG (1000 UNIT) tablet Take 1,000 Units by mouth daily.     clopidogrel (PLAVIX) 75 MG tablet Take 1 tablet (75 mg total) by mouth daily. 30 tablet 0   cycloSPORINE (RESTASIS) 0.05 % ophthalmic emulsion Place 1 drop into both eyes 2 (two) times daily.     EPINEPHrine 0.3 mg/0.3 mL IJ SOAJ injection Inject 0.3 mg into the muscle once as needed (for a severe allergic reaction/use as directed).     loratadine (CLARITIN) 10 MG tablet Take 1 tablet (10 mg total) by mouth daily. (Patient taking differently: Take 10 mg by mouth daily as needed.) 30 tablet 5   losartan (COZAAR) 50 MG tablet Take  50 mg by mouth daily.     Multiple Vitamin (MULTIVITAMIN) tablet Take 1 tablet by mouth daily.      pantoprazole (PROTONIX) 40 MG tablet Take 1 tablet (40 mg total) by mouth daily. 30 tablet 0   Probiotic Product (PROBIOTIC PO) Take 1 capsule by mouth daily.      Respiratory Therapy Supplies (FLUTTER) DEVI Use device 2-3 times a day to break up congestion 1 each 0   Spacer/Aero-Holding Chambers (AEROCHAMBER MV) inhaler Use as instructed 1 each 0   SYMBICORT 80-4.5 MCG/ACT inhaler  Inhale 2 puffs into the lungs in the morning and at bedtime. 1 each 12   Propylene Glycol (SYSTANE BALANCE) 0.6 % SOLN Place 1 drop into both eyes 4 (four) times daily as needed (dry eyes). (Patient not taking: Reported on 09/11/2023)     No facility-administered medications prior to visit.    Family History  Problem Relation Age of Onset   Heart disease Mother    Hypertension Mother    Diabetes Mother    Asthma Father    Colon cancer Sister 66   Irritable bowel syndrome Sister    Breast cancer Other        Niece   Stomach cancer Neg Hx     Social History   Socioeconomic History   Marital status: Widowed    Spouse name: Not on file   Number of children: 1   Years of education: college   Highest education level: Not on file  Occupational History   Occupation: retired  Tobacco Use   Smoking status: Former    Current packs/day: 0.00    Average packs/day: 0.5 packs/day for 4.3 years (2.1 ttl pk-yrs)    Types: Cigarettes    Start date: 41    Quit date: 09/28/1959    Years since quitting: 63.9    Passive exposure: Never   Smokeless tobacco: Never  Vaping Use   Vaping status: Never Used  Substance and Sexual Activity   Alcohol use: No    Alcohol/week: 0.0 standard drinks of alcohol   Drug use: No   Sexual activity: Not Currently  Other Topics Concern   Not on file  Social History Narrative   Patient is widowed with one child.   Patient is right handed.   Patient has college education.   Patient does not drink caffeine.   Social Drivers of Corporate investment banker Strain: Not on file  Food Insecurity: Low Risk  (06/08/2023)   Received from [facility]   Hunger Vital Sign    Worried About Running Out of Food in the Last Year: Never true    Ran Out of Food in the Last Year: Never true  Recent Concern: Food Insecurity - Medium Risk (05/18/2023)   [facility]   Hunger Vital Sign    Worried About Running Out of Food in the Last  Year: Patient declined to answer    Ran Out of Food in the Last Year: Sometimes true  Transportation Needs: No Transportation Needs (06/08/2023)   Received from [facility]    In the past 12 months, has lack of reliable transportation kept you from medical appointments, meetings, work or from getting things needed for daily living? : No  Physical Activity: Not on file  Stress: Not on file  Social Connections: Not on file  Intimate Partner Violence: Not At Risk (04/27/2023)   Humiliation, Afraid, Rape, and Kick questionnaire  Fear of Current or Ex-Partner: No    Emotionally Abused: No    Physically Abused: No    Sexually Abused: No                                                                                                  Objective:  Physical Exam: BP 124/76   Pulse 79   Temp (!) 97.5 F (36.4 C) (Temporal)   Ht 4\' 11"  (1.499 m)   Wt 130 lb 9.6 oz (59.2 kg)   SpO2 99%   BMI 26.38 kg/m    Physical Exam GENERAL: Alert, cooperative, well developed, no acute distress. HEENT: Normocephalic, normal oropharynx, moist mucous membranes. CHEST: Clear to auscultation bilaterally, non-labored breathing, no wheezes, rhonchi, or crackles. CARDIOVASCULAR: Normal heart rate and rhythm, S1 and S2 normal without murmurs. ABDOMEN: Soft, non-tender, non-distended, without organomegaly, normal bowel sounds. EXTREMITIES: No cyanosis, edema, or leg swelling. NEUROLOGICAL: Cranial nerves grossly intact, moves all extremities without gross motor or sensory deficit.   Results LABS WBC: 3.4 (05/2023) Hb: 12.3 (05/2023)    CARDIAC EVENT MONITOR Result Date: 07/17/2023 SR/SB Occasional PACs    No results found for this or any previous visit (from the past 2160 hours).      Garner Nash, MD, MS

## 2023-09-30 ENCOUNTER — Other Ambulatory Visit: Payer: Self-pay | Admitting: Emergency Medicine

## 2023-10-03 ENCOUNTER — Other Ambulatory Visit (INDEPENDENT_AMBULATORY_CARE_PROVIDER_SITE_OTHER)

## 2023-10-03 ENCOUNTER — Encounter: Payer: Self-pay | Admitting: Family Medicine

## 2023-10-03 DIAGNOSIS — G459 Transient cerebral ischemic attack, unspecified: Secondary | ICD-10-CM

## 2023-10-03 LAB — LIPID PANEL
Cholesterol: 173 mg/dL (ref 0–200)
HDL: 60.2 mg/dL (ref >39.00)
LDL Cholesterol: 100 mg/dL — ABNORMAL HIGH (ref 0–99)
NonHDL: 112.76
Total CHOL/HDL Ratio: 3
Triglycerides: 65 mg/dL (ref 0.0–149.0)
VLDL: 13 mg/dL (ref 0.0–40.0)

## 2023-10-03 LAB — URINALYSIS, ROUTINE W REFLEX MICROSCOPIC
Bilirubin Urine: NEGATIVE
Ketones, ur: NEGATIVE
Nitrite: NEGATIVE
Specific Gravity, Urine: 1.01 (ref 1.000–1.030)
Total Protein, Urine: NEGATIVE
Urine Glucose: NEGATIVE
Urobilinogen, UA: 0.2 (ref 0.0–1.0)
pH: 6 (ref 5.0–8.0)

## 2023-10-03 LAB — COMPREHENSIVE METABOLIC PANEL WITH GFR
ALT: 14 U/L (ref 0–35)
AST: 21 U/L (ref 0–37)
Albumin: 4.3 g/dL (ref 3.5–5.2)
Alkaline Phosphatase: 55 U/L (ref 39–117)
BUN: 26 mg/dL — ABNORMAL HIGH (ref 6–23)
CO2: 25 meq/L (ref 19–32)
Calcium: 9.4 mg/dL (ref 8.4–10.5)
Chloride: 105 meq/L (ref 96–112)
Creatinine, Ser: 1.12 mg/dL (ref 0.40–1.20)
GFR: 44.61 mL/min — ABNORMAL LOW (ref >60.00)
Glucose, Bld: 78 mg/dL (ref 70–99)
Potassium: 4 meq/L (ref 3.5–5.1)
Sodium: 139 meq/L (ref 135–145)
Total Bilirubin: 0.9 mg/dL (ref 0.2–1.2)
Total Protein: 6.7 g/dL (ref 6.0–8.3)

## 2023-10-03 LAB — CBC WITH DIFFERENTIAL/PLATELET
Basophils Absolute: 0 10*3/uL (ref 0.0–0.1)
Basophils Relative: 0.7 % (ref 0.0–3.0)
Eosinophils Absolute: 0.1 10*3/uL (ref 0.0–0.7)
Eosinophils Relative: 3.4 % (ref 0.0–5.0)
HCT: 37.3 % (ref 36.0–46.0)
Hemoglobin: 12.4 g/dL (ref 12.0–15.0)
Lymphocytes Relative: 33.4 % (ref 12.0–46.0)
Lymphs Abs: 1.3 10*3/uL (ref 0.7–4.0)
MCHC: 33.1 g/dL (ref 30.0–36.0)
MCV: 95 fl (ref 78.0–100.0)
Monocytes Absolute: 0.5 10*3/uL (ref 0.1–1.0)
Monocytes Relative: 12.1 % — ABNORMAL HIGH (ref 3.0–12.0)
Neutro Abs: 1.9 10*3/uL (ref 1.4–7.7)
Neutrophils Relative %: 50.4 % (ref 43.0–77.0)
Platelets: 234 10*3/uL (ref 150.0–400.0)
RBC: 3.93 Mil/uL (ref 3.87–5.11)
RDW: 13.6 % (ref 11.5–15.5)
WBC: 3.8 10*3/uL — ABNORMAL LOW (ref 4.0–10.5)

## 2023-10-03 LAB — MICROALBUMIN / CREATININE URINE RATIO
Creatinine,U: 64.6 mg/dL
Microalb Creat Ratio: UNDETERMINED mg/g (ref 0.0–30.0)
Microalb, Ur: <0.7 mg/dL

## 2023-10-03 LAB — HEMOGLOBIN A1C: Hgb A1c MFr Bld: 5.3 % (ref 4.6–6.5)

## 2023-10-03 LAB — TSH: TSH: 2.04 u[IU]/mL (ref 0.35–5.50)

## 2023-10-09 ENCOUNTER — Ambulatory Visit (INDEPENDENT_AMBULATORY_CARE_PROVIDER_SITE_OTHER): Admitting: Family Medicine

## 2023-10-09 ENCOUNTER — Encounter: Payer: Self-pay | Admitting: Family Medicine

## 2023-10-09 VITALS — BP 126/78 | HR 70 | Temp 97.9°F | Wt 130.2 lb

## 2023-10-09 DIAGNOSIS — K219 Gastro-esophageal reflux disease without esophagitis: Secondary | ICD-10-CM | POA: Diagnosis not present

## 2023-10-09 DIAGNOSIS — Z780 Asymptomatic menopausal state: Secondary | ICD-10-CM | POA: Insufficient documentation

## 2023-10-09 DIAGNOSIS — Z0000 Encounter for general adult medical examination without abnormal findings: Secondary | ICD-10-CM

## 2023-10-09 DIAGNOSIS — N1832 Chronic kidney disease, stage 3b: Secondary | ICD-10-CM | POA: Insufficient documentation

## 2023-10-09 DIAGNOSIS — Z67A1 Duffy null: Secondary | ICD-10-CM | POA: Insufficient documentation

## 2023-10-09 DIAGNOSIS — J4540 Moderate persistent asthma, uncomplicated: Secondary | ICD-10-CM

## 2023-10-09 DIAGNOSIS — I10 Essential (primary) hypertension: Secondary | ICD-10-CM | POA: Diagnosis not present

## 2023-10-09 DIAGNOSIS — E782 Mixed hyperlipidemia: Secondary | ICD-10-CM

## 2023-10-09 DIAGNOSIS — M6580 Other synovitis and tenosynovitis, unspecified site: Secondary | ICD-10-CM | POA: Insufficient documentation

## 2023-10-09 NOTE — Progress Notes (Signed)
 Assessment  Assessment/Plan:  Assessment and Plan Assessment & Plan Wellness Visit Annual wellness visit conducted. Lab work is stable with no significant abnormalities. Reports improved strength and recovery since illness earlier this year. - Schedule follow-up in six months for routine check-up and blood pressure re-evaluation.  Hypertension Blood pressure is well-controlled at 126/78 mmHg with losartan .  Hyperlipidemia Cholesterol levels are stable with total cholesterol at 173 mg/dL and LDL at 161 mg/dL. Atorvastatin  is effective.  Asthma Asthma is well-managed with Symbicort  inhaler. No recent exacerbations.  Gastroesophageal reflux disease (GERD) GERD symptoms are well-controlled with pantoprazole . Occasional heartburn only with spicy foods.  Stenosing tenosynovitis Intermittent locking of fingers, possibly due to trigger finger or osteoarthritis. No significant pain. Discussed potential use of diclofenac (Voltaren) gel for symptomatic relief, noting its localized effect and minimal systemic absorption. - Monitor symptoms and consider referral to orthopedics if condition worsens. - Consider use of diclofenac (Voltaren) gel for symptomatic relief if needed.  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 No signs of infection.  Pneumonia and bronchitis Pneumonia and bronchitis in January and February. Reports improved strength and recovery since illness.  Osteoporosis screening Last bone density test was in 2013. Requests screening due to concerns about bone health. Discussed that bone density tests focus on hip and lower back, not fingers. - Order bone density test.     There are no discontinued medications.  Patient Counseling(The following topics were reviewed and/or handout was given):  -Nutrition: Stressed importance of moderation in sodium/caffeine intake, saturated fat and cholesterol,  caloric balance, sufficient intake of fresh fruits, vegetables, and fiber.  -Stressed the importance of regular exercise.   -Substance Abuse: Discussed cessation/primary prevention of tobacco, alcohol , or other drug use; driving or other dangerous activities under the influence; availability of treatment for abuse.   -Injury prevention: Discussed safety belts, safety helmets, smoke detector, smoking near bedding or upholstery.   -Sexuality: Discussed sexually transmitted diseases, partner selection, use of condoms, avoidance of unintended pregnancy and contraceptive alternatives.   -Dental health: Discussed importance of regular tooth brushing, flossing, and dental visits.  -Health maintenance and immunizations reviewed. Please refer to Health maintenance section.  Return in about 6 months (around 04/09/2024) for BP, HLD.        Subjective:   Encounter date: 10/09/2023  Chief Complaint  Patient presents with   Annual Exam    Discuss labs    Discussed the use of AI scribe software for clinical note transcription with the patient, who gave verbal consent to proceed.  History of Present Illness [name] is an 88 year old female who presents for an annual wellness visit and lab work review.  She has not had a bone density test since 2013 and is concerned about osteoporosis due to the time elapsed since her last test.  She experiences issues with her fingers, describing them as getting 'locked' when she closes her hands or types. This occurs in both hands, affecting various fingers at different times, and is accompanied by mild pain. The fingers 'pull back into place' after getting stuck, and this issue slows her down while typing. No significant pain is reported in her hands, describing it as mild.  She feels achy in her bones but does not associate this with any specific pain or fractures.  Her current medications include Plavix , atorvastatin , pantoprazole , losartan , and  Symbicort . Pantoprazole  effectively manages her heartburn, which only occurs with spicy foods. Her blood  pressure is well-controlled with losartan , and she is managing her asthma with Symbicort .  She had pneumonia and bronchitis earlier in the year, which affected her strength and energy levels, but she has been working on improving her health through diet and exercise.  She has a history of benign familial leukopenia, which is stable, and her recent lab work showed no significant abnormalities.  She has a special needs son and her husband has passed away. She has family support and is active in her church community.       11/05/2019   10:56 AM 07/01/2018    9:56 AM  Depression screen PHQ 2/9  Decreased Interest 0 0  Down, Depressed, Hopeless 0 0  PHQ - 2 Score 0 0  Altered sleeping 2   Tired, decreased energy 0   Change in appetite 0   Feeling bad or failure about yourself  0   Trouble concentrating 0   Moving slowly or fidgety/restless 0   Suicidal thoughts 0   PHQ-9 Score 2   Difficult doing work/chores Not difficult at all         No data to display          There are no preventive care reminders to display for this patient.     PMH:  The following were reviewed and entered/updated in epic: Past Medical History:  Diagnosis Date   Arthritis    Asthma    Blood clotting disorder (HCC)    Cataract    COPD (chronic obstructive pulmonary disease) (HCC)    Depression    Diverticulosis 10-2011   Colonoscopy   GERD (gastroesophageal reflux disease)    Hemorrhoids, internal    Hx of colonic polyp    Hyperlipemia    Hypertension    IBS (irritable bowel syndrome)    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   EGD   TIA (transient ischemic attack)     Patient Active Problem List   Diagnosis Date Noted   Duffy-Null Associated Neutrophil Count [facility]) 10/09/2023   Stenosing tenosynovitis 10/09/2023   CKD stage 3b, GFR 30-44 ml/min (HCC) 10/09/2023   Postmenopausal estrogen  deficiency 10/09/2023   Keratoconjunctivitis sicca of both eyes not due to Sjogren's syndrome 09/11/2023   Bilateral sciatica 09/11/2023   Anemia 09/11/2023   Leucopenia 09/11/2023   Palpitations 04/28/2023   Myalgia 07/21/2019   Thrush 07/21/2019   Dyslipidemia 05/24/2018   Chest pain 05/24/2018   Abnormal EKG 05/24/2018   Pleurisy 09/13/2017   Neck pain 10/21/2014   Low back pain 10/21/2014   Essential hypertension 07/15/2014   HLD (hyperlipidemia) 03/24/2014   Headache on top of head 03/24/2014   TIA (transient ischemic attack) 03/13/2014   Stricture and stenosis of esophagus 12/13/2011   Family history of malignant neoplasm of gastrointestinal tract 11/01/2011   History of colonic polyps 11/01/2011   Allergic rhinitis 09/17/2008   Intrinsic asthma 09/17/2008   GERD 09/17/2008   CHOLECYSTECTOMY, HX OF 09/17/2008    Past Surgical History:  Procedure Laterality Date   CESAREAN SECTION  1962   LAPAROSCOPIC CHOLECYSTECTOMY  1995    Family History  Problem Relation Age of Onset   Heart disease Mother    Hypertension Mother    Diabetes Mother    Asthma Father    Colon cancer Sister 60   Irritable bowel syndrome Sister    Breast cancer Other        Niece   Stomach cancer Neg Hx     Medications- reviewed and updated  Outpatient Medications Prior to Visit  Medication Sig Dispense Refill   albuterol  (VENTOLIN  HFA) 108 (90 Base) MCG/ACT inhaler INHALE TWO PUFFS BY MOUTH INTO LUNGS EVERY SIX HOURS AS NEEDED FOR WHEEZING AND/OR SHORTNESS OF BREATH 8.5 g 2   atorvastatin  (LIPITOR) 40 MG tablet Take 1 tablet (40 mg total) by mouth daily. 30 tablet 0   cholecalciferol (VITAMIN D3) 25 MCG (1000 UNIT) tablet Take 1,000 Units by mouth daily.     clopidogrel  (PLAVIX ) 75 MG tablet Take 1 tablet (75 mg total) by mouth daily. 30 tablet 0   cycloSPORINE  (RESTASIS ) 0.05 % ophthalmic emulsion Place 1 drop into both eyes 2 (two) times daily.     EPINEPHrine 0.3 mg/0.3 mL IJ SOAJ  injection Inject 0.3 mg into the muscle once as needed (for a severe allergic reaction/use as directed).     loratadine  (CLARITIN ) 10 MG tablet Take 1 tablet (10 mg total) by mouth daily. (Patient taking differently: Take 10 mg by mouth daily as needed.) 30 tablet 5   losartan  (COZAAR ) 50 MG tablet Take 50 mg by mouth daily.     Multiple Vitamin (MULTIVITAMIN) tablet Take 1 tablet by mouth daily.      pantoprazole  (PROTONIX ) 40 MG tablet Take 1 tablet (40 mg total) by mouth daily. 30 tablet 0   Probiotic Product (PROBIOTIC PO) Take 1 capsule by mouth daily.      Respiratory Therapy Supplies (FLUTTER) DEVI Use device 2-3 times a day to break up congestion 1 each 0   Spacer/Aero-Holding Chambers (AEROCHAMBER MV) inhaler Use as instructed 1 each 0   SYMBICORT  80-4.5 MCG/ACT inhaler INHALE 2 INHALATIONS BY MOUTH  INTO THE LUNGS IN THE MORNING  AND AT BEDTIME 30.6 g 3   Propylene Glycol (SYSTANE BALANCE) 0.6 % SOLN Place 1 drop into both eyes 4 (four) times daily as needed (dry eyes). (Patient not taking: Reported on 09/11/2023)     No facility-administered medications prior to visit.    Allergies  Allergen Reactions   Cepacol Shortness Of Breath    Difficulty breathing   Contrast Media [Iodinated Contrast Media] Shortness Of Breath and Swelling    Pt reports "I could not breath at all and my face swelled up"    Iodine Shortness Of Breath   Lactose Intolerance (Gi) Shortness Of Breath    All dairy  Causes shortness of breath   Mushroom Extract Complex (Obsolete) Shortness Of Breath   Other Hives, Shortness Of Breath and Swelling    Mushrooms (All mushrooms)   Penicillins Shortness Of Breath and Swelling    DID THE REACTION INVOLVE: Swelling of the face/tongue/throat, SOB, or low BP? Yes  Sudden or severe rash/hives, skin peeling, or the inside of the mouth or nose? No Did it require medical treatment? No When did it last happen? Over 10 years ago   If all above answers are "NO", may proceed  with cephalosporin use.    Sulfamethoxazole Anaphylaxis   Aspirin  Other (See Comments)    "Burns stomach up"   Ciprofloxacin     Other reaction(s): Unknown   Budesonide  Other (See Comments)    Headache    Social History   Socioeconomic History   Marital status: Widowed    Spouse name: Not on file   Number of children: 1   Years of education: college   Highest education level: Not on file  Occupational History   Occupation: retired  Tobacco Use   Smoking status: Former    Current packs/day: 0.00  Average packs/day: 0.5 packs/day for 4.3 years (2.1 ttl pk-yrs)    Types: Cigarettes    Start date: 76    Quit date: 09/28/1959    Years since quitting: 64.0    Passive exposure: Never   Smokeless tobacco: Never  Vaping Use   Vaping status: Never Used  Substance and Sexual Activity   Alcohol  use: No    Alcohol /week: 0.0 standard drinks of alcohol    Drug use: No   Sexual activity: Not Currently  Other Topics Concern   Not on file  Social History Narrative   Patient is widowed with one child.   Patient is right handed.   Patient has college education.   Patient does not drink caffeine.   Social Drivers of Corporate investment banker Strain: Not on file  Food Insecurity: Low Risk  (06/08/2023)   Received from [facility]   Hunger Vital Sign    Worried About Running Out of Food in the Last Year: Never true    Ran Out of Food in the Last Year: Never true  Recent Concern: Food Insecurity - Medium Risk (05/18/2023)   [facility]   Hunger Vital Sign    Worried About Running Out of Food in the Last Year: Patient declined to answer    Ran Out of Food in the Last Year: Sometimes true  Transportation Needs: No Transportation Needs (06/08/2023)   Received from [facility]    In the past 12 months, has lack of reliable transportation kept you from medical appointments, meetings, work or from getting things needed for daily  living? : No  Physical Activity: Not on file  Stress: Not on file  Social Connections: Not on file           Objective:  Physical Exam: BP 126/78   Pulse 70   Temp 97.9 F (36.6 C) (Temporal)   Wt 130 lb 3.2 oz (59.1 kg)   SpO2 98%   BMI 26.30 kg/m   Body mass index is 26.3 kg/m. Wt Readings from Last 3 Encounters:  10/09/23 130 lb 3.2 oz (59.1 kg)  09/11/23 130 lb 9.6 oz (59.2 kg)  08/02/23 129 lb (58.5 kg)     Physical Exam Constitutional:      General: She is not in acute distress.    Appearance: Normal appearance. She is not ill-appearing or toxic-appearing.  HENT:     Head: Normocephalic and atraumatic.     Right Ear: Hearing, tympanic membrane, ear canal and external ear normal. There is no impacted cerumen.     Left Ear: Hearing, tympanic membrane, ear canal and external ear normal. There is no impacted cerumen.     Nose: Nose normal. No congestion.     Mouth/Throat:     Lips: No lesions.     Mouth: Mucous membranes are moist.     Pharynx: Oropharynx is clear. No oropharyngeal exudate.  Eyes:     General: No scleral icterus.       Right eye: No discharge.        Left eye: No discharge.     Conjunctiva/sclera: Conjunctivae normal.     Pupils: Pupils are equal, round, and reactive to light.  Neck:     Thyroid : No thyroid  mass, thyromegaly or thyroid  tenderness.  Cardiovascular:     Rate and Rhythm: Normal rate and regular rhythm.     Pulses: Normal pulses.     Heart sounds: Normal heart sounds.  Pulmonary:     Effort: Pulmonary effort is normal. No respiratory distress.     Breath sounds: Normal breath sounds.  Abdominal:     General: Abdomen is flat. Bowel sounds are normal.     Palpations: Abdomen is soft.  Musculoskeletal:        General: Normal range of motion.     Right hand: No swelling, deformity or tenderness. Normal range of motion.     Left hand: No swelling, deformity or tenderness. Normal range of motion.     Cervical back: Normal range  of motion.     Right lower leg: No edema.     Left lower leg: No edema.  Lymphadenopathy:     Cervical: No cervical adenopathy.  Skin:    General: Skin is warm and dry.     Findings: No rash.  Neurological:     General: No focal deficit present.     Mental Status: She is alert and oriented to person, place, and time. Mental status is at baseline.     Deep Tendon Reflexes:     Reflex Scores:      Patellar reflexes are 2+ on the right side and 2+ on the left side. Psychiatric:        Mood and Affect: Mood normal.        Behavior: Behavior normal.        Thought Content: Thought content normal.        Judgment: Judgment normal.         Prior labs:   Recent Results (from the past 2160 hours)  Comprehensive metabolic panel     Status: Abnormal   Collection Time: 10/03/23 10:12 AM  Result Value Ref Range   Sodium 139 135 - 145 mEq/L   Potassium 4.0 3.5 - 5.1 mEq/L   Chloride 105 96 - 112 mEq/L   CO2 25 19 - 32 mEq/L   Glucose, Bld 78 70 - 99 mg/dL   BUN 26 (H) 6 - 23 mg/dL   Creatinine, Ser 4.09 0.40 - 1.20 mg/dL   Total Bilirubin 0.9 0.2 - 1.2 mg/dL   Alkaline Phosphatase 55 39 - 117 U/L   AST 21 0 - 37 U/L   ALT 14 0 - 35 U/L   Total Protein 6.7 6.0 - 8.3 g/dL   Albumin 4.3 3.5 - 5.2 g/dL   GFR 81.19 (L) >14.78 mL/min    Comment: Calculated using the CKD-EPI Creatinine Equation (2021)   Calcium  9.4 8.4 - 10.5 mg/dL  CBC with Differential/Platelet     Status: Abnormal   Collection Time: 10/03/23 10:12 AM  Result Value Ref Range   WBC 3.8 (L) 4.0 - 10.5 K/uL   RBC 3.93 3.87 - 5.11 Mil/uL   Hemoglobin 12.4 12.0 - 15.0 g/dL   HCT 29.5 62.1 - 30.8 %   MCV 95.0 78.0 - 100.0 fl   MCHC 33.1 30.0 - 36.0 g/dL   RDW 65.7 84.6 - 96.2 %   Platelets 234.0 150.0 - 400.0 K/uL   Neutrophils Relative % 50.4 43.0 - 77.0 %   Lymphocytes Relative 33.4 12.0 - 46.0 %   Monocytes Relative 12.1 (H) 3.0 - 12.0 %   Eosinophils Relative 3.4 0.0 - 5.0 %   Basophils Relative 0.7 0.0 - 3.0  %   Neutro Abs 1.9 1.4 - 7.7 K/uL   Lymphs Abs 1.3 0.7 - 4.0 K/uL   Monocytes Absolute 0.5 0.1 - 1.0 K/uL   Eosinophils Absolute 0.1 0.0 -  0.7 K/uL   Basophils Absolute 0.0 0.0 - 0.1 K/uL  Hemoglobin A1c     Status: None   Collection Time: 10/03/23 10:12 AM  Result Value Ref Range   Hgb A1c MFr Bld 5.3 4.6 - 6.5 %    Comment: Glycemic Control Guidelines for People with Diabetes:Non Diabetic:  <6%Goal of Therapy: <7%Additional Action Suggested:  >8%   Lipid panel     Status: Abnormal   Collection Time: 10/03/23 10:12 AM  Result Value Ref Range   Cholesterol 173 0 - 200 mg/dL    Comment: ATP III Classification       Desirable:  < 200 mg/dL               Borderline High:  200 - 239 mg/dL          High:  > = 098 mg/dL   Triglycerides 11.9 0.0 - 149.0 mg/dL    Comment: Normal:  <147 mg/dLBorderline High:  150 - 199 mg/dL   HDL 82.95 >62.13 mg/dL   VLDL 08.6 0.0 - 57.8 mg/dL   LDL Cholesterol 469 (H) 0 - 99 mg/dL   Total CHOL/HDL Ratio 3     Comment:                Men          Women1/2 Average Risk     3.4          3.3Average Risk          5.0          4.42X Average Risk          9.6          7.13X Average Risk          15.0          11.0                       NonHDL 112.76     Comment: NOTE:  Non-HDL goal should be 30 mg/dL higher than patient's LDL goal (i.e. LDL goal of < 70 mg/dL, would have non-HDL goal of < 100 mg/dL)  TSH     Status: None   Collection Time: 10/03/23 10:12 AM  Result Value Ref Range   TSH 2.04 0.35 - 5.50 uIU/mL  Urinalysis, Routine w reflex microscopic     Status: Abnormal   Collection Time: 10/03/23 10:14 AM  Result Value Ref Range   Color, Urine YELLOW Yellow;Lt. Yellow;Straw;Dark Yellow;Amber;Green;Red;Brown   APPearance CLEAR Clear;Turbid;Slightly Cloudy;Cloudy   Specific Gravity, Urine 1.010 1.000 - 1.030   pH 6.0 5.0 - 8.0   Total Protein, Urine NEGATIVE Negative   Urine Glucose NEGATIVE Negative   Ketones, ur NEGATIVE Negative   Bilirubin Urine  NEGATIVE Negative   Hgb urine dipstick TRACE-INTACT (A) Negative   Urobilinogen, UA 0.2 0.0 - 1.0   Leukocytes,Ua SMALL (A) Negative   Nitrite NEGATIVE Negative   WBC, UA 3-6/hpf (A) 0-2/hpf   RBC / HPF 0-2/hpf 0-2/hpf   Squamous Epithelial / HPF Rare(0-4/hpf) Rare(0-4/hpf)   Bacteria, UA Rare(<10/hpf) (A) None  Microalbumin / creatinine urine ratio     Status: None   Collection Time: 10/03/23 10:14 AM  Result Value Ref Range   Microalb, Ur <0.7 mg/dL   Creatinine,U 62.9 mg/dL   Microalb Creat Ratio Unable to calculate 0.0 - 30.0 mg/g    Comment: Unable to Calculate due to Microalbumin Result of <0.7 mg/dL    Lab Results  Component Value Date   CHOL 173 10/03/2023   CHOL 182 04/28/2023   CHOL 187 03/13/2014   Lab Results  Component Value Date   HDL 60.20 10/03/2023   HDL 54 04/28/2023   HDL 57 03/13/2014   Lab Results  Component Value Date   LDLCALC 100 (H) 10/03/2023   LDLCALC 116 (H) 04/28/2023   LDLCALC 118 (H) 03/13/2014   Lab Results  Component Value Date   TRIG 65.0 10/03/2023   TRIG 62 04/28/2023   TRIG 59 03/13/2014   Lab Results  Component Value Date   CHOLHDL 3 10/03/2023   CHOLHDL 3.4 04/28/2023   CHOLHDL 3.3 03/13/2014   No results found for: "LDLDIRECT"  Last metabolic panel Lab Results  Component Value Date   GLUCOSE 78 10/03/2023   NA 139 10/03/2023   K 4.0 10/03/2023   CL 105 10/03/2023   CO2 25 10/03/2023   BUN 26 (H) 10/03/2023   CREATININE 1.12 10/03/2023   GFR 44.61 (L) 10/03/2023   CALCIUM  9.4 10/03/2023   PROT 6.7 10/03/2023   ALBUMIN 4.3 10/03/2023   BILITOT 0.9 10/03/2023   ALKPHOS 55 10/03/2023   AST 21 10/03/2023   ALT 14 10/03/2023   ANIONGAP 9 05/31/2023    Lab Results  Component Value Date   HGBA1C 5.3 10/03/2023    Last CBC Lab Results  Component Value Date   WBC 3.8 (L) 10/03/2023   HGB 12.4 10/03/2023   HCT 37.3 10/03/2023   MCV 95.0 10/03/2023   MCH 30.8 05/31/2023   RDW 13.6 10/03/2023   PLT 234.0  10/03/2023    Lab Results  Component Value Date   TSH 2.04 10/03/2023    No results found for: "PSA1", "PSA"  Last vitamin D No results found for: "25OHVITD2", "25OHVITD3", "VD25OH"  Lab Results  Component Value Date   BILIRUBINUR NEGATIVE 10/03/2023   PROTEINUR NEGATIVE 04/27/2023   UROBILINOGEN 0.2 10/03/2023   LEUKOCYTESUR SMALL (A) 10/03/2023    Lab Results  Component Value Date   MICROALBUR <0.7 10/03/2023     At today's visit, we discussed treatment options, associated risk and benefits, and engage in counseling as needed.  Additionally the following were reviewed: Past medical records, past medical and surgical history, family and social background, as well as relevant laboratory results, imaging findings, and specialty notes, where applicable.  This message was generated using dictation software, and as a result, it may contain unintentional typos or errors.  Nevertheless, extensive effort was made to accurately convey at the pertinent aspects of the patient visit.    There may have been are other unrelated non-urgent complaints, but due to the busy schedule and the amount of time already spent with her, time does not permit to address these issues at today's visit. Another appointment may have or has been requested to review these additional issues.     Harle Libra, MD, MS

## 2023-10-09 NOTE — Patient Instructions (Addendum)
 VISIT SUMMARY:  Today, you had your annual wellness visit and reviewed your lab work. Your lab results are stable with no significant abnormalities. You have been feeling stronger and recovering well since your illness earlier this year. We discussed your concerns about osteoporosis, the locking of your fingers, and reviewed your current medications and overall health.  YOUR PLAN:  -WELLNESS VISIT: Your annual wellness visit was conducted, and your lab work is stable with no significant abnormalities. You have reported improved strength and recovery since your illness earlier this year. We will schedule a follow-up in six months for a routine check-up and to re-evaluate your blood pressure.  -HYPERTENSION: Your blood pressure is well-controlled at 126/78 mmHg with your current medication, losartan . Hypertension is high blood pressure, which can lead to serious health issues if not managed properly.  -HYPERLIPIDEMIA: Your cholesterol levels are stable with a total cholesterol of 173 mg/dL and LDL of 540 mg/dL. Hyperlipidemia is having high levels of fats (lipids) in your blood, which can increase the risk of heart disease. Your medication, atorvastatin , is effective in managing this condition.  -ASTHMA: Your asthma is well-managed with your Symbicort  inhaler, and you have not had any recent asthma attacks. Asthma is a condition where your airways narrow and swell, making it difficult to breathe.  -GASTROESOPHAGEAL REFLUX DISEASE (GERD): Your GERD symptoms are well-controlled with pantoprazole , and you only experience occasional heartburn with spicy foods. GERD is a digestive disorder where stomach acid irritates the food pipe lining.  -TRIGGER FINGER: You are experiencing intermittent locking of your fingers, which may be due to trigger finger or osteoarthritis. Trigger finger is a condition where your fingers get stuck in a bent position. We discussed using diclofenac (Voltaren) gel for relief if  needed and monitoring your symptoms. If the condition worsens, we may consider a referral to orthopedics.  -BENIGN FAMILIAL LEUKOPENIA: Your consistently low white blood cell count is stable and consistent with benign familial leukopenia, a condition where you have fewer white blood cells than normal but it does not cause health problems. There are no signs of infection.  -PNEUMONIA AND BRONCHITIS: You had pneumonia and bronchitis earlier this year but have reported improved strength and recovery since then. Pneumonia is an infection that inflames the air sacs in one or both lungs, and bronchitis is inflammation of the lining of your bronchial tubes.  -OSTEOPOROSIS SCREENING: You are concerned about osteoporosis since your last bone density test was in 2013. Osteoporosis is a condition where bones become weak and brittle. We will order a bone density test to check your bone health.  INSTRUCTIONS:  Please schedule a follow-up appointment in six months for a routine check-up and to re-evaluate your blood pressure. Additionally, we will order a bone density test to assess your bone health.

## 2023-10-18 ENCOUNTER — Telehealth (HOSPITAL_BASED_OUTPATIENT_CLINIC_OR_DEPARTMENT_OTHER): Payer: Self-pay

## 2023-10-24 ENCOUNTER — Encounter: Payer: Self-pay | Admitting: Emergency Medicine

## 2023-10-24 ENCOUNTER — Ambulatory Visit: Payer: Medicare Other | Admitting: Emergency Medicine

## 2023-10-24 VITALS — BP 104/60 | HR 72 | Ht <= 58 in | Wt 129.8 lb

## 2023-10-24 DIAGNOSIS — J309 Allergic rhinitis, unspecified: Secondary | ICD-10-CM

## 2023-10-24 DIAGNOSIS — J4540 Moderate persistent asthma, uncomplicated: Secondary | ICD-10-CM

## 2023-10-24 DIAGNOSIS — J4550 Severe persistent asthma, uncomplicated: Secondary | ICD-10-CM | POA: Diagnosis not present

## 2023-10-24 DIAGNOSIS — Z7951 Long term (current) use of inhaled steroids: Secondary | ICD-10-CM | POA: Diagnosis not present

## 2023-10-24 MED ORDER — FLUTICASONE PROPIONATE 50 MCG/ACT NA SUSP: 2.0000 | Freq: Every day | NASAL | 2 refills | Status: DC

## 2023-10-24 MED ORDER — SYMBICORT 80-4.5 MCG/ACT IN AERO: 2.0000 | INHALATION_SPRAY | Freq: Two times a day (BID) | RESPIRATORY_TRACT | 3 refills | Status: AC

## 2023-10-24 NOTE — Patient Instructions (Signed)
 Please restart your loratadine  10 mg once daily. Try starting fluticasone nasal spray 2 sprays each nostril once daily. Continue your pantoprazole  (Protonix ) once daily. Continue your Symbicort  2 puffs twice a day.  Rinse and gargle after using. Keep albuterol  available use 2 puffs when needed for shortness of breath, chest tightness, wheezing. Keep your flu shot up-to-date each fall Follow-up in our office in 6 months, call sooner if you have any problems.

## 2023-10-24 NOTE — Progress Notes (Signed)
 Subjective:    Patient ID: Barbara  JADESOLA [name], female    DOB: 09-12-1935, 88 y.o.   MRN: [id number]  Asthma Her past medical history is significant for asthma.   ROV 11/06/19 --[name] is 88, has a history of COPD/asthma with a positive bronchodilator response, intermittent allergic rhinitis and GERD.  We have been managing her on ICS/LABA but she is had significant difficulty with throat, tongue irritation, possible thrush.  She was changed to [facility] in late March to see if this would help and be easier to tolerate.  She had acute exacerbation of COPD 09/23/2019 and was treated with steroids.  Most recently she was tried on Brovana , was unable to get budesonide  so she took low dose pred for a couple weeks, now off.  She has had to go back to generic symbicort . She does rinse and gargle after. She uses albuterol  about 0-1x a day.   ROV 08/04/20 --follow-up visit for 88 year old woman with history of COPD/asthma (positive bronchodilator response), allergic rhinitis, GERD.  I have been managing her on Symbicort  but she had been having difficulty avoiding thrush.  She was tried on Stiolto Respimat  in November 2021 she may benefit at some but she is currently off of it because it has been contributing to cough.  She started using albuterol  neb qd. She has albuterol  which she is using several times a day. Cold air is a trigger.  When she stopped the Stiolto her cough did improve some. She was also dealing with esophageal irritation at the same time. She is using omeprazole  bid since she stopped the Stiolto, still has breakthrough reflux on this.   ROV 11/08/2022 --[name] is 88 with a history of COPD/asthma, positive bronchodilator response.  She also has allergic rhinitis and GERD.  I last saw her 07/2020, most recently seen here by [name].  She has been managed most recently on Symbicort .  Has had trouble with multiple inhalers over the years due to thrush but seems to be tolerating.  Unfortunately she  had COVID-19 about 3 weeks ago. She has increased cough, dyspnea, some fever. Was treated with prednisone , abx. She is feeling better now - no fever, improved chest fullness. She is still having cough, especially at night. Taking OTC cough meds - coricidin. She still feels fatigue and exertional SOB.  Currently managed on Symbicort  80, omeprazole  40 mg daily.  She is using albuterol  occasionally during the day. She coughs when supine. She still feels acid reflux. Allergies not bothersome right now, off loratadine .   ROV 10/24/2023 --follow-up visit for 88 year old woman with a history of COPD/asthma with a positive bronchodilator response, impacted by both allergic rhinitis and GERD.  She has a history of thrush with multiple inhalers.  Last seen here 04/2023. She is having more allergy and exertional SOB, has been on symbicort  80, currently using albuterol  2-3x a day. Not unlike past Spring sx.  Coughs clear mucous during the day esp in the am. Not doing loratadine  right now. She is also having some occasional breakthrough GERD on protonix  every day.    Review of Systems Per HPI     Objective:   Physical Exam Vitals:   10/24/23 1332 10/24/23 1333  BP: (!) 102/59 104/60  Pulse: 72   SpO2: 96%   Weight: 129 lb 12.8 oz (58.9 kg)   Height: 4\' 9"  (1.448 m)     Gen: Pleasant, elderly woman, in no distress,  normal affect  ENT: No lesions,  mouth clear,  no thrush  Neck: No JVD, no stridor  Lungs: No use of accessory muscles, distant, no wheeze or crackles  Cardiovascular: RRR, heart sounds normal, no murmur or gallops, no peripheral edema  Musculoskeletal: No deformities, no cyanosis or clubbing  Neuro: alert, non focal  Skin: Warm, no lesions or rashes      Assessment & Plan:  Intrinsic asthma Continue your Symbicort  2 puffs twice a day.  Rinse and gargle after using. Keep albuterol  available use 2 puffs when needed for shortness of breath, chest tightness, wheezing. Keep your flu  shot up-to-date each fall Follow-up in our office in 6 months, call sooner if you have any problems.  Allergic rhinitis More active over the last month which is her typical pattern during the spring (not so much during the fall).  Need to add back an allergy regimen.  Will restart loratadine  and add fluticasone nasal spray.    Racheal Buddle, MD, PhD 10/24/2023, 1:47 PM [facility] [phone] or if no answer [phone]

## 2023-10-24 NOTE — Assessment & Plan Note (Signed)
 More active over the last month which is her typical pattern during the spring (not so much during the fall).  Need to add back an allergy regimen.  Will restart loratadine  and add fluticasone nasal spray.

## 2023-10-24 NOTE — Assessment & Plan Note (Signed)
 Continue your Symbicort  2 puffs twice a day.  Rinse and gargle after using. Keep albuterol  available use 2 puffs when needed for shortness of breath, chest tightness, wheezing. Keep your flu shot up-to-date each fall Follow-up in our office in 6 months, call sooner if you have any problems.

## 2023-10-24 NOTE — Addendum Note (Signed)
 Addended byGregory Leash, Mushka Laconte A on: 10/24/2023 01:53 PM   Modules accepted: Orders

## 2023-11-02 ENCOUNTER — Ambulatory Visit: Payer: Medicare Other | Admitting: Podiatry

## 2023-11-14 ENCOUNTER — Ambulatory Visit: Payer: Medicare Other | Admitting: Primary Care

## 2023-11-16 ENCOUNTER — Emergency Department (HOSPITAL_COMMUNITY)
Admission: EM | Admit: 2023-11-16 | Discharge: 2023-11-17 | Disposition: A | Discharge status: Home / Self Care | Attending: Emergency Medicine | Admitting: Emergency Medicine

## 2023-11-16 ENCOUNTER — Encounter (HOSPITAL_COMMUNITY): Payer: Self-pay | Admitting: *Deleted

## 2023-11-16 ENCOUNTER — Other Ambulatory Visit: Payer: Self-pay

## 2023-11-16 ENCOUNTER — Emergency Department (HOSPITAL_COMMUNITY)

## 2023-11-16 DIAGNOSIS — R0602 Shortness of breath: Secondary | ICD-10-CM | POA: Diagnosis not present

## 2023-11-16 DIAGNOSIS — R519 Headache, unspecified: Secondary | ICD-10-CM | POA: Insufficient documentation

## 2023-11-16 DIAGNOSIS — T7840XA Allergy, unspecified, initial encounter: Secondary | ICD-10-CM | POA: Diagnosis present

## 2023-11-16 DIAGNOSIS — Z7901 Long term (current) use of anticoagulants: Secondary | ICD-10-CM | POA: Insufficient documentation

## 2023-11-16 NOTE — ED Provider Notes (Signed)
 [place] EMERGENCY DEPARTMENT AT [facility] Provider Note   CSN: [id number] Arrival date & time: 11/16/23  2247     History  Chief Complaint  Patient presents with   pos allergic reaction    Barbara  F [name] is a 88 y.o. female.  Patient presents for evaluation of allergic reaction.  Patient reports that she has multiple food allergies.  She ate salmon with a new type of rub on it tonight and then started feeling abnormal.  She reports numbness of the top of her head and face, pain all over.  She developed increased shortness of breath, wheezing and has used her inhaler multiple times.  Face felt tight earlier but she used her EpiPen and symptoms are improving.       Home Medications Prior to Admission medications   Medication Sig Start Date End Date Taking? Authorizing Provider  cetirizine (ZYRTEC ALLERGY) 10 MG tablet Take 1 tablet (10 mg total) by mouth daily. 11/17/23  Yes Margrette Wynia, Marine Sia, MD  predniSONE  (DELTASONE ) 20 MG tablet Take 1 tablet (20 mg total) by mouth daily. 11/17/23  Yes Daune Colgate, Marine Sia, MD  albuterol  (VENTOLIN  HFA) 108 (90 Base) MCG/ACT inhaler INHALE TWO PUFFS BY MOUTH INTO LUNGS EVERY SIX HOURS AS NEEDED FOR WHEEZING AND/OR SHORTNESS OF BREATH 11/29/22   Denson Flake, MD  atorvastatin  (LIPITOR) 40 MG tablet Take 1 tablet (40 mg total) by mouth daily. 05/30/23   Wess Hammed, NP  cholecalciferol (VITAMIN D3) 25 MCG (1000 UNIT) tablet Take 1,000 Units by mouth daily.    [provider]  clopidogrel  (PLAVIX ) 75 MG tablet Take 1 tablet (75 mg total) by mouth daily. 04/30/23   Verdell Given, MD  cycloSPORINE  (RESTASIS ) 0.05 % ophthalmic emulsion Place 1 drop into both eyes 2 (two) times daily.    [provider]  EPINEPHrine 0.3 mg/0.3 mL IJ SOAJ injection Inject 0.3 mg into the muscle once as needed (for a severe allergic reaction/use as directed). 02/01/18   [provider]  fluticasone  (FLONASE ) 50 MCG/ACT  nasal spray Place 2 sprays into both nostrils daily. 10/24/23   Denson Flake, MD  loratadine  (CLARITIN ) 10 MG tablet Take 1 tablet (10 mg total) by mouth daily. Patient not taking: Reported on 10/24/2023 04/21/22   Antonio Baumgarten, NP  losartan  (COZAAR ) 50 MG tablet Take 50 mg by mouth daily.    [provider]  Multiple Vitamin (MULTIVITAMIN) tablet Take 1 tablet by mouth daily.     [provider]  pantoprazole  (PROTONIX ) 40 MG tablet Take 1 tablet (40 mg total) by mouth daily. 05/31/23   Mordecai Applebaum, MD  Probiotic Product (PROBIOTIC PO) Take 1 capsule by mouth daily.     [provider]  Propylene Glycol (SYSTANE BALANCE) 0.6 % SOLN Place 1 drop into both eyes 4 (four) times daily as needed (dry eyes). 04/29/23   Ivin Marrow, MD  Respiratory Therapy Supplies (FLUTTER) DEVI Use device 2-3 times a day to break up congestion 07/17/18   Antonio Baumgarten, NP  Spacer/Aero-Holding Chambers (AEROCHAMBER MV) inhaler Use as instructed 11/08/22   Byrum, Robert S, MD  SYMBICORT  80-4.5 MCG/ACT inhaler Inhale 2 puffs into the lungs 2 (two) times daily. 10/24/23   Denson Flake, MD      Allergies    Cepacol, Contrast media [iodinated contrast media], Iodine, Lactose intolerance (gi), Mushroom extract complex (obsolete), Other, Penicillins, Sulfamethoxazole, Aspirin , Ciprofloxacin, and Budesonide     Review of Systems  Review of Systems  Physical Exam Updated Vital Signs BP 129/67   Pulse 72   Temp 98.6 F (37 C) (Oral)   Resp 18   Ht 4\' 9"  (1.448 m)   Wt 58.9 kg   SpO2 100%   BMI 28.10 kg/m  Physical Exam Vitals and nursing note reviewed.  Constitutional:      General: She is not in acute distress.    Appearance: She is well-developed.  HENT:     Head: Normocephalic and atraumatic.     Mouth/Throat:     Mouth: Mucous membranes are moist.  Eyes:     General: Vision grossly intact. Gaze aligned appropriately.     Extraocular Movements: Extraocular  movements intact.     Conjunctiva/sclera: Conjunctivae normal.  Cardiovascular:     Rate and Rhythm: Normal rate and regular rhythm.     Pulses: Normal pulses.     Heart sounds: Normal heart sounds, S1 normal and S2 normal. No murmur heard.    No friction rub. No gallop.  Pulmonary:     Effort: Pulmonary effort is normal. No respiratory distress.     Breath sounds: Normal breath sounds.  Abdominal:     General: Bowel sounds are normal.     Palpations: Abdomen is soft.     Tenderness: There is no abdominal tenderness. There is no guarding or rebound.     Hernia: No hernia is present.  Musculoskeletal:        General: No swelling.     Cervical back: Full passive range of motion without pain, normal range of motion and neck supple. No spinous process tenderness or muscular tenderness. Normal range of motion.     Right lower leg: No edema.     Left lower leg: No edema.  Skin:    General: Skin is warm and dry.     Capillary Refill: Capillary refill takes less than 2 seconds.     Findings: No ecchymosis, erythema, rash or wound.  Neurological:     General: No focal deficit present.     Mental Status: She is alert and oriented to person, place, and time.     GCS: GCS eye subscore is 4. GCS verbal subscore is 5. GCS motor subscore is 6.     Cranial Nerves: Cranial nerves 2-12 are intact.     Sensory: Sensation is intact.     Motor: Motor function is intact.     Coordination: Coordination is intact.  Psychiatric:        Attention and Perception: Attention normal.        Mood and Affect: Mood normal.        Speech: Speech normal.        Behavior: Behavior normal.     ED Results / Procedures / Treatments   Labs (all labs ordered are listed, but only abnormal results are displayed) Labs Reviewed  COMPREHENSIVE METABOLIC PANEL WITH GFR - Abnormal; Notable for the following components:      Result Value   Glucose, Bld 101 (*)    BUN 25 (*)    Creatinine, Ser 1.43 (*)    GFR,  Estimated 36 (*)    All other components within normal limits  RESP PANEL BY RT-PCR (RSV, FLU A&B, COVID)  [id number]  CBC WITH DIFFERENTIAL/PLATELET    EKG None  Radiology CT HEAD WO CONTRAST ( ) Result Date: 11/17/2023 CLINICAL DATA:  Headache EXAM: CT HEAD WITHOUT CONTRAST TECHNIQUE: Contiguous axial images were obtained from the base of  the skull through the vertex without intravenous contrast. RADIATION DOSE REDUCTION: This exam was performed according to the departmental dose-optimization program which includes automated exposure control, adjustment of the mA and/or kV according to patient size and/or use of iterative reconstruction technique. COMPARISON:  None Available. FINDINGS: Brain: Normal anatomic configuration. Parenchymal volume loss is commensurate with the patient's age. Mild periventricular white matter changes are present likely reflecting the sequela of small vessel ischemia. No abnormal intra or extra-axial mass lesion or fluid collection. No abnormal mass effect or midline shift. No evidence of acute intracranial hemorrhage or infarct. Ventricular size is normal. Cerebellum unremarkable. Vascular: No asymmetric hyperdense vasculature at the skull base. Skull: Intact Sinuses/Orbits: Paranasal sinuses are clear. Orbits are unremarkable. Other: Mastoid air cells and middle ear cavities are clear. IMPRESSION: 1. No acute intracranial abnormality. 2. Mild periventricular white matter changes likely reflecting the sequela of small vessel ischemia. Electronically Signed   By: Worthy Heads M.D.   On: 11/17/2023 02:02   DG Chest 2 View Result Date: 11/16/2023 CLINICAL DATA:  Shortness of breath EXAM: CHEST - 2 VIEW COMPARISON:  06/23/2023 FINDINGS: Aortic atherosclerosis. No focal airspace disease, pleural effusion or pneumothorax. Normal cardiac size. IMPRESSION: No active cardiopulmonary disease. Electronically Signed   By: Esmeralda Hedge M.D.   On: 11/16/2023 23:54     Procedures Procedures    Medications Ordered in ED Medications  cetirizine HCl (Zyrtec) 5 MG/5ML solution 10 mg (has no administration in time range)  predniSONE  (DELTASONE ) tablet 20 mg (has no administration in time range)    ED Course/ Medical Decision Making/ A&P                                 Medical Decision Making Amount and/or Complexity of Data Reviewed Labs: ordered. Radiology: ordered.   Patient presents to the emergency department with concern over allergic reaction.  Patient reports that she has multiple food allergies.  She ate salmon tonight which she has tolerated in the past.  She does report that there was a rub on it the fish that she has never had before.  Patient self-administered epinephrine at home.  At arrival she has some vague complaints of aches, tingling, headache but no obvious swelling, rash or allergic symptoms.  Workup has been reassuring and unremarkable.  Vital signs remain unremarkable.  She has been monitored for 4 hours without any change in condition.  Will discharge with low-dose prednisone  which will help with her asthma as well as possible allergic symptoms, antihistamines and return precautions.        Final Clinical Impression(s) / ED Diagnoses Final diagnoses:  Allergic reaction, initial encounter    Rx / DC Orders ED Discharge Orders          Ordered    predniSONE  (DELTASONE ) 20 MG tablet  Daily        11/17/23 0317    cetirizine (ZYRTEC ALLERGY) 10 MG tablet  Daily        11/17/23 0317              Keymiah Lyles J, MD 11/17/23 [phone]

## 2023-11-16 NOTE — ED Triage Notes (Signed)
 The pt thinks she is having an allergic reaction around 1600 after she ate fish with a different  rub  at 1600  she started having multiple symptoms around 1700 headache ears ringing face numbness aching all over her body she attempted an epi pen but does not think that she got the meds in  she also has had some sob and she used her inhaler and her breathing is better now  she drove herself here

## 2023-11-16 NOTE — ED Provider Triage Note (Addendum)
 Emergency Medicine Provider Triage Evaluation Note  Barbara  F [name] , a 88 y.o. female  was evaluated in triage.  Pt complains of possible allergic reaction.  Patient states that this afternoon she ate some salmon with some surface paste rub on it.  She began to feel some itchiness and felt short of breath.  She states she used her home albuterol  multiple times and somewhere around 7 to 8 PM she attempted to use her EpiPen.  She is not sure that she properly used her EpiPen.  She currently endorses a cough and some facial numbness.  She denies chest pain, nausea, vomiting, shortness of breath, and abdominal pain  Review of Systems  Positive:  Negative:   Physical Exam  BP (!) 157/67 (BP Location: Right Arm)   Pulse 77   Temp 98.6 F (37 C) (Oral)   Resp 18   Ht 4\' 9"  (1.448 m)   Wt 58.9 kg   SpO2 92%   BMI 28.10 kg/m  Gen:   Awake, no distress   Resp:  Normal effort  MSK:   Moves extremities without difficulty  Other:    Medical Decision Making  Medically screening exam initiated at 11:07 PM.  Appropriate orders placed.  Lyle  F Koogler was informed that the remainder of the evaluation will be completed by another provider, this initial triage assessment does not replace that evaluation, and the importance of remaining in the ED until their evaluation is complete.  I spoke with the charge nurse and requested a bed for observation.  With patient's age feel the patient would not be appropriate to wait in the lobby at this time with possible epinephrine usage.   Elisa Guest, PA-C 11/16/23 2308    Abelardo Hoehn B, PA-C 11/16/23 [phone]

## 2023-11-17 ENCOUNTER — Emergency Department (HOSPITAL_COMMUNITY)

## 2023-11-17 LAB — CBC WITH DIFFERENTIAL/PLATELET
Abs Immature Granulocytes: 0.01 10*3/uL (ref 0.00–0.07)
Basophils Absolute: 0 10*3/uL (ref 0.0–0.1)
Basophils Relative: 0 %
Eosinophils Absolute: 0.1 10*3/uL (ref 0.0–0.5)
Eosinophils Relative: 2 %
HCT: 36.1 % (ref 36.0–46.0)
Hemoglobin: 12 g/dL (ref 12.0–15.0)
Immature Granulocytes: 0 %
Lymphocytes Relative: 19 %
Lymphs Abs: 1.2 10*3/uL (ref 0.7–4.0)
MCH: 30.3 pg (ref 26.0–34.0)
MCHC: 33.2 g/dL (ref 30.0–36.0)
MCV: 91.2 fL (ref 80.0–100.0)
Monocytes Absolute: 0.8 10*3/uL (ref 0.1–1.0)
Monocytes Relative: 14 %
Neutro Abs: 4 10*3/uL (ref 1.7–7.7)
Neutrophils Relative %: 65 %
Platelets: 227 10*3/uL (ref 150–400)
RBC: 3.96 MIL/uL (ref 3.87–5.11)
RDW: 13.2 % (ref 11.5–15.5)
WBC: 6.1 10*3/uL (ref 4.0–10.5)
nRBC: 0 % (ref 0.0–0.2)

## 2023-11-17 LAB — COMPREHENSIVE METABOLIC PANEL WITH GFR
ALT: 22 U/L (ref 0–44)
AST: 28 U/L (ref 15–41)
Albumin: 3.8 g/dL (ref 3.5–5.0)
Alkaline Phosphatase: 51 U/L (ref 38–126)
Anion gap: 14 (ref 5–15)
BUN: 25 mg/dL — ABNORMAL HIGH (ref 8–23)
CO2: 23 mmol/L (ref 22–32)
Calcium: 9.4 mg/dL (ref 8.9–10.3)
Chloride: 102 mmol/L (ref 98–111)
Creatinine, Ser: 1.43 mg/dL — ABNORMAL HIGH (ref 0.44–1.00)
GFR, Estimated: 36 mL/min — ABNORMAL LOW (ref >60)
Glucose, Bld: 101 mg/dL — ABNORMAL HIGH (ref 70–99)
Potassium: 4.4 mmol/L (ref 3.5–5.1)
Sodium: 139 mmol/L (ref 135–145)
Total Bilirubin: 1.1 mg/dL (ref 0.0–1.2)
Total Protein: 7 g/dL (ref 6.5–8.1)

## 2023-11-17 LAB — RESP PANEL BY RT-PCR (RSV, FLU A&B, COVID)  RVPGX2
Influenza A by PCR: NEGATIVE
Influenza B by PCR: NEGATIVE
Resp Syncytial Virus by PCR: NEGATIVE
SARS Coronavirus 2 by RT PCR: NEGATIVE

## 2023-11-17 MED ORDER — PREDNISONE 20 MG PO TABS: 20.0000 mg | ORAL_TABLET | Freq: Every day | ORAL | 0 refills | Status: DC

## 2023-11-17 MED ORDER — PREDNISONE 20 MG PO TABS
20.0000 mg | ORAL_TABLET | Freq: Every day | ORAL | Status: DC
  Administered 2023-11-17: 20 mg via ORAL
  Filled 2023-11-17: qty 1

## 2023-11-17 MED ORDER — CETIRIZINE HCL 10 MG PO TABS: 10.0000 mg | ORAL_TABLET | Freq: Every day | ORAL | 0 refills | Status: DC

## 2023-11-17 MED ORDER — CETIRIZINE HCL 5 MG/5ML PO SOLN
10.0000 mg | Freq: Once | ORAL | Status: AC
  Administered 2023-11-17: 10 mg via ORAL
  Filled 2023-11-17: qty 10

## 2023-11-17 MED ORDER — PREDNISONE 20 MG PO TABS: 20.0000 mg | ORAL_TABLET | Freq: Every day | ORAL | Status: DC

## 2023-11-17 NOTE — ED Notes (Signed)
 Patient transported to CT

## 2023-11-17 NOTE — ED Notes (Signed)
 Patient returned from CT

## 2023-11-19 ENCOUNTER — Inpatient Hospital Stay (HOSPITAL_BASED_OUTPATIENT_CLINIC_OR_DEPARTMENT_OTHER): Admission: RE | Admit: 2023-11-19 | Source: Ambulatory Visit

## 2023-11-23 ENCOUNTER — Ambulatory Visit: Admitting: Podiatry

## 2023-11-26 ENCOUNTER — Other Ambulatory Visit

## 2023-11-29 ENCOUNTER — Encounter: Payer: Self-pay | Admitting: Family Medicine

## 2023-11-29 ENCOUNTER — Ambulatory Visit (INDEPENDENT_AMBULATORY_CARE_PROVIDER_SITE_OTHER): Admitting: Family Medicine

## 2023-11-29 VITALS — BP 122/74 | HR 66 | Temp 97.3°F | Ht <= 58 in | Wt 126.4 lb

## 2023-11-29 DIAGNOSIS — H6993 Unspecified Eustachian tube disorder, bilateral: Secondary | ICD-10-CM

## 2023-11-29 DIAGNOSIS — J301 Allergic rhinitis due to pollen: Secondary | ICD-10-CM

## 2023-11-29 DIAGNOSIS — T782XXA Anaphylactic shock, unspecified, initial encounter: Secondary | ICD-10-CM | POA: Insufficient documentation

## 2023-11-29 DIAGNOSIS — T782XXD Anaphylactic shock, unspecified, subsequent encounter: Secondary | ICD-10-CM

## 2023-11-29 DIAGNOSIS — R519 Headache, unspecified: Secondary | ICD-10-CM

## 2023-11-29 DIAGNOSIS — J4540 Moderate persistent asthma, uncomplicated: Secondary | ICD-10-CM | POA: Diagnosis not present

## 2023-11-29 MED ORDER — EPINEPHRINE 0.3 MG/0.3ML IJ SOAJ: 0.3000 mg | Freq: Once | INTRAMUSCULAR | 1 refills | Status: AC | PRN

## 2023-11-29 MED ORDER — ACETAMINOPHEN 500 MG PO TABS: 500.0000 mg | ORAL_TABLET | Freq: Four times a day (QID) | ORAL | 0 refills | Status: AC | PRN

## 2023-11-29 MED ORDER — CETIRIZINE HCL 10 MG PO TABS
10.0000 mg | ORAL_TABLET | Freq: Every day | ORAL | 3 refills | Status: AC
Start: 2023-11-29 — End: 2024-11-23

## 2023-11-29 MED ORDER — MONTELUKAST SODIUM 10 MG PO TABS: 10.0000 mg | ORAL_TABLET | Freq: Every day | ORAL | 3 refills | Status: DC

## 2023-11-29 MED ORDER — FLUTICASONE PROPIONATE 50 MCG/ACT NA SUSP
2.0000 | Freq: Two times a day (BID) | NASAL | 11 refills | Status: AC
Start: 2023-11-29 — End: 2024-11-23

## 2023-11-29 MED ORDER — AZELASTINE HCL 0.1 % NA SOLN: 2.0000 | Freq: Two times a day (BID) | NASAL | 12 refills | Status: AC

## 2023-11-29 NOTE — Progress Notes (Signed)
 Assessment & Plan   Assessment/Plan:   Anaphylaxis Likely due to some new topical.  Symptoms have resolved status post EpiPen  and steroid course. Recommend avoiding offending agents and keeping a log of any symptoms that may be associated with an allergic reaction Refill epinephrine  pen Referral to allergy  Allergic rhinitis with associated sinus headaches Incomplete control on cetirizine .  Recommend adding intranasal steroids, intranasal antihistamine, and addition of montelukast  - Fluticasone  nasal spray 2 sprays per nostril daily - Azelastine  1 spray per nare twice daily - Montelukast  10 mg nightly - Referral asthma and allergy - Continue OTC acetaminophen  as needed for aches  Asthma Currently exacerbated by ongoing allergies and recent food allergy.  Symptoms improved.   - Continue Symbicort  inhaler -Continue albuterol  inhaler as needed -Start montelukast  10 mg nightly - Referral to asthma and allergy    Medications Discontinued During This Encounter  Medication Reason   predniSONE  (DELTASONE ) 20 MG tablet    loratadine  (CLARITIN ) 10 MG tablet    EPINEPHrine  0.3 mg/0.3 mL IJ SOAJ injection Reorder   fluticasone  (FLONASE ) 50 MCG/ACT nasal spray Reorder   cetirizine  (ZYRTEC  ALLERGY) 10 MG tablet Reorder    Return in about 1 week (around 12/06/2023), or if symptoms worsen or fail to improve.        Subjective:   Encounter date: 11/29/2023  [name] is a 88 y.o. female who has Allergic rhinitis; Intrinsic asthma; GERD; CHOLECYSTECTOMY, HX OF; Family history of malignant neoplasm of gastrointestinal tract; History of colonic polyps; Stricture and stenosis of esophagus; TIA (transient ischemic attack); HLD (hyperlipidemia); Headache on top of head; Essential hypertension; Neck pain; Low back pain; Pleurisy; Dyslipidemia; Chest pain; Abnormal EKG; Myalgia; Thrush; Palpitations; Keratoconjunctivitis sicca of both eyes not due to Sjogren's syndrome; Bilateral  sciatica; Anemia; Leucopenia; Duffy-Null Associated Neutrophil [facility]); Stenosing tenosynovitis; CKD stage 3b, GFR 30-44 ml/min (HCC); Postmenopausal estrogen deficiency; and Anaphylactic syndrome on their problem list..   She  has a past medical history of Arthritis, Asthma, Blood clotting disorder (HCC), Cataract, COPD (chronic obstructive pulmonary disease) (HCC), Depression, Diverticulosis (10-2011), GERD (gastroesophageal reflux disease), Hemorrhoids, internal, colonic polyp, Hyperlipemia, Hypertension, IBS (irritable bowel syndrome), Stricture of esophagus (10-2011), and TIA (transient ischemic attack)..   She presents with chief complaint of Follow-up (Recent ED visit ) .   Patient presented to the Emergency Department on 11/16/2023 for possible anaphylactic event.  Patient has a significant history of food allergies.  She is not exactly sure what the trigger is, however, she believes it was some new type of topical ointment she had tried.  She reports that she developed numbness and tingling of the face with some pain and swelling of her tongue mouth as well as difficulty breathing and wheezing.  She did use her inhaler and her EpiPen .  The emergency department patient was given course of steroids and antihistamines at discharge.  Patient reports that she is doing much better from this and.  Saying that she does not refill her EpiPen  but she has no other signs of anaphylaxis or difficulty breathing at the moment.  Says that her asthma is well-controlled.  She does have a history of allergic rhinitis and is worsened this past season.  She is currently taking cetirizine  and reports ongoing sinus headaches.  She has no fevers or chills.  Pain is improved with Tylenol .    ROS  Past Surgical History:  Procedure Laterality Date   CESAREAN SECTION  1962   LAPAROSCOPIC CHOLECYSTECTOMY  1995  Outpatient Medications Prior to Visit  Medication Sig Dispense Refill   albuterol  (VENTOLIN  HFA)  108 (90 Base) MCG/ACT inhaler INHALE TWO PUFFS BY MOUTH INTO LUNGS EVERY SIX HOURS AS NEEDED FOR WHEEZING AND/OR SHORTNESS OF BREATH 8.5 g 2   atorvastatin  (LIPITOR) 40 MG tablet Take 1 tablet (40 mg total) by mouth daily. 30 tablet 0   cholecalciferol (VITAMIN D3) 25 MCG (1000 UNIT) tablet Take 1,000 Units by mouth daily.     clopidogrel  (PLAVIX ) 75 MG tablet Take 1 tablet (75 mg total) by mouth daily. 30 tablet 0   cycloSPORINE  (RESTASIS ) 0.05 % ophthalmic emulsion Place 1 drop into both eyes 2 (two) times daily. (Patient taking differently: Place 1 drop into both eyes as needed.)     losartan  (COZAAR ) 50 MG tablet Take 50 mg by mouth daily.     Multiple Vitamin (MULTIVITAMIN) tablet Take 1 tablet by mouth daily.      pantoprazole  (PROTONIX ) 40 MG tablet Take 1 tablet (40 mg total) by mouth daily. 30 tablet 0   Probiotic Product (PROBIOTIC PO) Take 1 capsule by mouth daily.      Propylene Glycol (SYSTANE BALANCE) 0.6 % SOLN Place 1 drop into both eyes 4 (four) times daily as needed (dry eyes).     SYMBICORT  80-4.5 MCG/ACT inhaler Inhale 2 puffs into the lungs 2 (two) times daily. 30.6 g 3   cetirizine  (ZYRTEC  ALLERGY) 10 MG tablet Take 1 tablet (10 mg total) by mouth daily. 15 tablet 0   EPINEPHrine  0.3 mg/0.3 mL IJ SOAJ injection Inject 0.3 mg into the muscle once as needed (for a severe allergic reaction/use as directed).     fluticasone  (FLONASE ) 50 MCG/ACT nasal spray Place 2 sprays into both nostrils daily. (Patient taking differently: Place 2 sprays into both nostrils as needed for allergies or rhinitis.) 11.1 mL 2   predniSONE  (DELTASONE ) 20 MG tablet Take 1 tablet (20 mg total) by mouth daily. 4 tablet 0   Respiratory Therapy Supplies (FLUTTER) DEVI Use device 2-3 times a day to break up congestion 1 each 0   Spacer/Aero-Holding Chambers (AEROCHAMBER MV) inhaler Use as instructed 1 each 0   loratadine  (CLARITIN ) 10 MG tablet Take 1 tablet (10 mg total) by mouth daily. (Patient not taking:  Reported on 10/24/2023) 30 tablet 5   No facility-administered medications prior to visit.    Family History  Problem Relation Age of Onset   Heart disease Mother    Hypertension Mother    Diabetes Mother    Asthma Father    Colon cancer Sister 20   Irritable bowel syndrome Sister    Breast cancer Other        Niece   Stomach cancer Neg Hx     Social History   Socioeconomic History   Marital status: Widowed    Spouse name: Not on file   Number of children: 1   Years of education: college   Highest education level: Not on file  Occupational History   Occupation: retired  Tobacco Use   Smoking status: Former    Current packs/day: 0.00    Average packs/day: 0.5 packs/day for 4.3 years (2.1 ttl pk-yrs)    Types: Cigarettes    Start date: 68    Quit date: 09/28/1959    Years since quitting: 64.2    Passive exposure: Never   Smokeless tobacco: Never  Vaping Use   Vaping status: Never Used  Substance and Sexual Activity   Alcohol  use: No  Alcohol /week: 0.0 standard drinks of alcohol    Drug use: No   Sexual activity: Not Currently  Other Topics Concern   Not on file  Social History Narrative   Patient is widowed with one child.   Patient is right handed.   Patient has college education.   Patient does not drink caffeine.   Social Drivers of Corporate investment banker Strain: Not on file  Food Insecurity: Low Risk  (06/08/2023)   Received from [facility]   Hunger Vital Sign    Within the past 12 months, you worried that your food would run out before you got money to buy more: Never true    Within the past 12 months, the food you bought just didn't last and you didn't have money to get more. : Never true  Recent Concern: Food Insecurity - Medium Risk (05/18/2023)   Received from [facility]   Hunger Vital Sign    Worried About Running Out of Food in the Last Year: Patient declined to answer    Ran Out of Food in the Last Year: Sometimes true   Transportation Needs: No Transportation Needs (06/08/2023)   Received from [facility]    In the past 12 months, has lack of reliable transportation kept you from medical appointments, meetings, work or from getting things needed for daily living? : No  Physical Activity: Not on file  Stress: Not on file  Social Connections: Not on file  Intimate Partner Violence: Not At Risk (04/27/2023)   Humiliation, Afraid, Rape, and Kick questionnaire    Fear of Current or Ex-Partner: No    Emotionally Abused: No    Physically Abused: No    Sexually Abused: No                                                                                                  Objective:  Physical Exam: BP 122/74 (BP Location: Left Arm, Patient Position: Sitting, Cuff Size: Normal)   Pulse 66   Temp (!) 97.3 F (36.3 C) (Temporal)   Ht 4' 8 (1.422 m)   Wt 126 lb 6.4 oz (57.3 kg)   SpO2 98%   BMI 28.34 kg/m     Physical Exam Constitutional:      General: She is not in acute distress.    Appearance: Normal appearance. She is not ill-appearing or toxic-appearing.  HENT:     Head: Normocephalic and atraumatic.     Nose: Nose normal. No congestion.   Eyes:     General: No scleral icterus.    Extraocular Movements: Extraocular movements intact.    Cardiovascular:     Rate and Rhythm: Normal rate and regular rhythm.     Pulses: Normal pulses.     Heart sounds: Normal heart sounds.  Pulmonary:     Effort: Pulmonary effort is normal. No respiratory distress.     Breath sounds: Normal breath sounds.  Abdominal:     General: Abdomen is flat. Bowel sounds are normal.     Palpations: Abdomen is soft.   Musculoskeletal:  General: Normal range of motion.  Lymphadenopathy:     Cervical: No cervical adenopathy.   Skin:    General: Skin is warm and dry.     Findings: No rash.   Neurological:     General: No focal deficit present.     Mental Status: She is alert and oriented  to person, place, and time. Mental status is at baseline.   Psychiatric:        Mood and Affect: Mood normal.        Behavior: Behavior normal.        Thought Content: Thought content normal.        Judgment: Judgment normal.     CT HEAD WO CONTRAST ( ) Result Date: 11/17/2023 CLINICAL DATA:  Headache EXAM: CT HEAD WITHOUT CONTRAST TECHNIQUE: Contiguous axial images were obtained from the base of the skull through the vertex without intravenous contrast. RADIATION DOSE REDUCTION: This exam was performed according to the departmental dose-optimization program which includes automated exposure control, adjustment of the mA and/or kV according to patient size and/or use of iterative reconstruction technique. COMPARISON:  None Available. FINDINGS: Brain: Normal anatomic configuration. Parenchymal volume loss is commensurate with the patient's age. Mild periventricular white matter changes are present likely reflecting the sequela of small vessel ischemia. No abnormal intra or extra-axial mass lesion or fluid collection. No abnormal mass effect or midline shift. No evidence of acute intracranial hemorrhage or infarct. Ventricular size is normal. Cerebellum unremarkable. Vascular: No asymmetric hyperdense vasculature at the skull base. Skull: Intact Sinuses/Orbits: Paranasal sinuses are clear. Orbits are unremarkable. Other: Mastoid air cells and middle ear cavities are clear. IMPRESSION: 1. No acute intracranial abnormality. 2. Mild periventricular white matter changes likely reflecting the sequela of small vessel ischemia. Electronically Signed   By: Worthy Heads M.D.   On: 11/17/2023 02:02   DG Chest 2 View Result Date: 11/16/2023 CLINICAL DATA:  Shortness of breath EXAM: CHEST - 2 VIEW COMPARISON:  06/23/2023 FINDINGS: Aortic atherosclerosis. No focal airspace disease, pleural effusion or pneumothorax. Normal cardiac size. IMPRESSION: No active cardiopulmonary disease. Electronically Signed   By: Esmeralda Hedge M.D.   On: 11/16/2023 23:54    Recent Results (from the past 2160 hours)  Comprehensive metabolic panel     Status: Abnormal   Collection Time: 10/03/23 10:12 AM  Result Value Ref Range   Sodium 139 135 - 145 mEq/L   Potassium 4.0 3.5 - 5.1 mEq/L   Chloride 105 96 - 112 mEq/L   CO2 25 19 - 32 mEq/L   Glucose, Bld 78 70 - 99 mg/dL   BUN 26 (H) 6 - 23 mg/dL   Creatinine, Ser 2.13 0.40 - 1.20 mg/dL   Total Bilirubin 0.9 0.2 - 1.2 mg/dL   Alkaline Phosphatase 55 39 - 117 U/L   AST 21 0 - 37 U/L   ALT 14 0 - 35 U/L   Total Protein 6.7 6.0 - 8.3 g/dL   Albumin 4.3 3.5 - 5.2 g/dL   GFR 08.65 (L) >78.46 mL/min    Comment: Calculated using the CKD-EPI Creatinine Equation (2021)   Calcium  9.4 8.4 - 10.5 mg/dL  CBC with Differential/Platelet     Status: Abnormal   Collection Time: 10/03/23 10:12 AM  Result Value Ref Range   WBC 3.8 (L) 4.0 - 10.5 K/uL   RBC 3.93 3.87 - 5.11 Mil/uL   Hemoglobin 12.4 12.0 - 15.0 g/dL   HCT 96.2 95.2 - 84.1 %   MCV 95.0 78.0 -  100.0 fl   MCHC 33.1 30.0 - 36.0 g/dL   RDW 16.1 09.6 - 04.5 %   Platelets 234.0 150.0 - 400.0 K/uL   Neutrophils Relative % 50.4 43.0 - 77.0 %   Lymphocytes Relative 33.4 12.0 - 46.0 %   Monocytes Relative 12.1 (H) 3.0 - 12.0 %   Eosinophils Relative 3.4 0.0 - 5.0 %   Basophils Relative 0.7 0.0 - 3.0 %   Neutro Abs 1.9 1.4 - 7.7 K/uL   Lymphs Abs 1.3 0.7 - 4.0 K/uL   Monocytes Absolute 0.5 0.1 - 1.0 K/uL   Eosinophils Absolute 0.1 0.0 - 0.7 K/uL   Basophils Absolute 0.0 0.0 - 0.1 K/uL  Hemoglobin A1c     Status: None   Collection Time: 10/03/23 10:12 AM  Result Value Ref Range   Hgb A1c MFr Bld 5.3 4.6 - 6.5 %    Comment: Glycemic Control Guidelines for People with Diabetes:Non Diabetic:  <6%Goal of Therapy: <7%Additional Action Suggested:  >8%   Lipid panel     Status: Abnormal   Collection Time: 10/03/23 10:12 AM  Result Value Ref Range   Cholesterol 173 0 - 200 mg/dL    Comment: ATP III Classification        Desirable:  < 200 mg/dL               Borderline High:  200 - 239 mg/dL          High:  > = 409 mg/dL   Triglycerides 81.1 0.0 - 149.0 mg/dL    Comment: Normal:  <914 mg/dLBorderline High:  150 - 199 mg/dL   HDL 78.29 >56.21 mg/dL   VLDL 30.8 0.0 - 65.7 mg/dL   LDL Cholesterol 846 (H) 0 - 99 mg/dL   Total CHOL/HDL Ratio 3     Comment:                Men          Women1/2 Average Risk     3.4          3.3Average Risk          5.0          4.42X Average Risk          9.6          7.13X Average Risk          15.0          11.0                       NonHDL 112.76     Comment: NOTE:  Non-HDL goal should be 30 mg/dL higher than patient's LDL goal (i.e. LDL goal of < 70 mg/dL, would have non-HDL goal of < 100 mg/dL)  TSH     Status: None   Collection Time: 10/03/23 10:12 AM  Result Value Ref Range   TSH 2.04 0.35 - 5.50 uIU/mL  Urinalysis, Routine w reflex microscopic     Status: Abnormal   Collection Time: 10/03/23 10:14 AM  Result Value Ref Range   Color, Urine YELLOW Yellow;Lt. Yellow;Straw;Dark Yellow;Amber;Green;Red;Brown   APPearance CLEAR Clear;Turbid;Slightly Cloudy;Cloudy   Specific Gravity, Urine 1.010 1.000 - 1.030   pH 6.0 5.0 - 8.0   Total Protein, Urine NEGATIVE Negative   Urine Glucose NEGATIVE Negative   Ketones, ur NEGATIVE Negative   Bilirubin Urine NEGATIVE Negative   Hgb urine dipstick TRACE-INTACT (A) Negative   Urobilinogen, UA 0.2 0.0 -  1.0   Leukocytes,Ua SMALL (A) Negative   Nitrite NEGATIVE Negative   WBC, UA 3-6/hpf (A) 0-2/hpf   RBC / HPF 0-2/hpf 0-2/hpf   Squamous Epithelial / HPF Rare(0-4/hpf) Rare(0-4/hpf)   Bacteria, UA Rare(<10/hpf) (A) None  Microalbumin / creatinine urine ratio     Status: None   Collection Time: 10/03/23 10:14 AM  Result Value Ref Range   Microalb, Ur <0.7 mg/dL   Creatinine,U 21.3 mg/dL   Microalb Creat Ratio Unable to calculate 0.0 - 30.0 mg/g    Comment: Unable to Calculate due to Microalbumin Result of <0.7 mg/dL  CBC with  Differential/Platelet     Status: None   Collection Time: 11/17/23 12:40 AM  Result Value Ref Range   WBC 6.1 4.0 - 10.5 K/uL   RBC 3.96 3.87 - 5.11 MIL/uL   Hemoglobin 12.0 12.0 - 15.0 g/dL   HCT 08.6 57.8 - 46.9 %   MCV 91.2 80.0 - 100.0 fL   MCH 30.3 26.0 - 34.0 pg   MCHC 33.2 30.0 - 36.0 g/dL   RDW 62.9 52.8 - 41.3 %   Platelets 227 150 - 400 K/uL   nRBC 0.0 0.0 - 0.2 %   Neutrophils Relative % 65 %   Neutro Abs 4.0 1.7 - 7.7 K/uL   Lymphocytes Relative 19 %   Lymphs Abs 1.2 0.7 - 4.0 K/uL   Monocytes Relative 14 %   Monocytes Absolute 0.8 0.1 - 1.0 K/uL   Eosinophils Relative 2 %   Eosinophils Absolute 0.1 0.0 - 0.5 K/uL   Basophils Relative 0 %   Basophils Absolute 0.0 0.0 - 0.1 K/uL   Immature Granulocytes 0 %   Abs Immature Granulocytes 0.01 0.00 - 0.07 K/uL    Comment: Performed at [facility], [address] [place], [place] [address]  Comprehensive metabolic panel with GFR     Status: Abnormal   Collection Time: 11/17/23 12:40 AM  Result Value Ref Range   Sodium 139 135 - 145 mmol/L   Potassium 4.4 3.5 - 5.1 mmol/L   Chloride 102 98 - 111 mmol/L   CO2 23 22 - 32 mmol/L   Glucose, Bld 101 (H) 70 - 99 mg/dL    Comment: Glucose reference range applies only to samples taken after fasting for at least 8 hours.   BUN 25 (H) 8 - 23 mg/dL   Creatinine, Ser 0.27 (H) 0.44 - 1.00 mg/dL   Calcium  9.4 8.9 - 10.3 mg/dL   Total Protein 7.0 6.5 - 8.1 g/dL   Albumin 3.8 3.5 - 5.0 g/dL   AST 28 15 - 41 U/L   ALT 22 0 - 44 U/L   Alkaline Phosphatase 51 38 - 126 U/L   Total Bilirubin 1.1 0.0 - 1.2 mg/dL   GFR, Estimated 36 (L) >60 mL/min    Comment: (NOTE) Calculated using the CKD-EPI Creatinine Equation (2021)    Anion gap 14 5 - 15    Comment: Performed at [facility], [address] [place], [place] [address]  Resp panel by RT-PCR (RSV, Flu A&B, Covid) Anterior Nasal Swab     Status: None   Collection Time: 11/17/23 12:50 AM   Specimen: Anterior Nasal  Swab  Result Value Ref Range   SARS Coronavirus 2 by RT PCR NEGATIVE NEGATIVE   Influenza A by PCR NEGATIVE NEGATIVE   Influenza B by PCR NEGATIVE NEGATIVE    Comment: (NOTE) The Xpert Xpress SARS-CoV-2/FLU/RSV plus assay is intended as  an aid in the diagnosis of influenza from Nasopharyngeal swab specimens and should not be used as a sole basis for treatment. Nasal washings and aspirates are unacceptable for Xpert Xpress SARS-CoV-2/FLU/RSV testing.  Fact Sheet for Patients: BloggerCourse.com  Fact Sheet for Healthcare Providers: SeriousBroker.it  This test is not yet approved or cleared by the [place]  FDA and has been authorized for detection and/or diagnosis of SARS-CoV-2 by FDA under an Emergency Use Authorization (EUA). This EUA will remain in effect (meaning this test can be used) for the duration of the COVID-19 declaration under Section 564(b)(1) of the Act, 21 U.S.C. section 360bbb-3(b)(1), unless the authorization is terminated or revoked.     Resp Syncytial Virus by PCR NEGATIVE NEGATIVE    Comment: (NOTE) Fact Sheet for Patients: BloggerCourse.com  Fact Sheet for Healthcare Providers: SeriousBroker.it  This test is not yet approved or cleared by the [place]  FDA and has been authorized for detection and/or diagnosis of SARS-CoV-2 by FDA under an Emergency Use Authorization (EUA). This EUA will remain in effect (meaning this test can be used) for the duration of the COVID-19 declaration under Section 564(b)(1) of the Act, 21 U.S.C. section 360bbb-3(b)(1), unless the authorization is terminated or revoked.  Performed at [facility], [address] [place], [place] [address]         Carnell Christian, MD, MS

## 2023-11-29 NOTE — Patient Instructions (Addendum)
 For allergies and sinus congestion and for drainage of ears, please take montelukast  10 mg by mouth at night, cetirizine  10 mg by mouth at night, fluticasone  nasal spray 2 sprays in each nostril 2 times a day, azelastine nasal spray 2 sprays in each nostril 2 times a day.  For allergies and asthma, we have referred you to specialist.  They will reach out to you.  Please follow-up with us  if you have not heard back weeks.  For pain, you may take acetaminophen  500 mg - 1000 mg every 6 hours hours as needed.

## 2023-12-06 ENCOUNTER — Encounter: Payer: Self-pay | Admitting: Family Medicine

## 2023-12-06 ENCOUNTER — Ambulatory Visit (INDEPENDENT_AMBULATORY_CARE_PROVIDER_SITE_OTHER): Admitting: Family Medicine

## 2023-12-06 VITALS — BP 120/60 | HR 65 | Temp 98.4°F | Ht <= 58 in | Wt 126.8 lb

## 2023-12-06 DIAGNOSIS — J4540 Moderate persistent asthma, uncomplicated: Secondary | ICD-10-CM

## 2023-12-06 DIAGNOSIS — K219 Gastro-esophageal reflux disease without esophagitis: Secondary | ICD-10-CM

## 2023-12-06 DIAGNOSIS — J301 Allergic rhinitis due to pollen: Secondary | ICD-10-CM

## 2023-12-06 MED ORDER — PANTOPRAZOLE SODIUM 40 MG PO TBEC
40.0000 mg | DELAYED_RELEASE_TABLET | Freq: Every day | ORAL | 3 refills | Status: DC
Start: 2023-12-06 — End: 2024-03-06

## 2023-12-06 NOTE — Progress Notes (Signed)
 Assessment & Plan   Assessment/Plan:    Problem List Items Addressed This Visit       Respiratory   Allergic rhinitis   Intrinsic asthma   Laryngopharyngeal reflux disease - Primary   Relevant Medications   pantoprazole  (PROTONIX ) 40 MG tablet         Assessment & Plan Chronic Rhinitis Chronic rhinitis with a mixed picture of allergic rhinitis. Symptoms include fluid in the head, headaches, and numbness, which have improved with treatment. Nasal spray has been effective in clearing symptoms, although some symptoms like ear sizzling have recurred. Overall, the condition is well controlled with current medications. Discussed the use of nasal spray during allergy season and the possibility of year-round allergies. - Continue cetirizine  10 mg daily - Continue fluticasone  nasal spray 50 mcg, two sprays daily - Continue azelastine  spray, one spray per nostril twice daily - Continue montelukast   Laryngeal Reflux Laryngeal reflux has contributed to rhinitis symptoms in the past. Currently well controlled with pantoprazole  40 mg. Discussed the possibility of trialing off the medication to assess symptom control, with the option to resume if symptoms worsen. Explained that pantoprazole  can be taken long-term without causing damage, unlike steroids which require short bursts. - Continue pantoprazole  40 mg - Ensure refills for pantoprazole  are available - Consider trial off pantoprazole  to assess symptom control  Asthma Asthma is well-managed with Symbicort  and montelukast . Breathing has improved with current treatment regimen. She follows up with pulmonology for asthma management. - Continue Symbicort  80/4.5 mcg, two puffs twice daily - Continue montelukast  - Follow up with pulmonology as needed  General Health Maintenance Cholesterol levels were checked in April and were within normal limits. Kidney function is stable. No changes needed in current health maintenance  plan.  Follow-up Follow-up as needed for chronic conditions. - Follow up as needed for chronic conditions      Medications Discontinued During This Encounter  Medication Reason   pantoprazole  (PROTONIX ) 40 MG tablet Reorder    Return if symptoms worsen or fail to improve.        Subjective:   Encounter date: 12/06/2023  Barbara  F [name] is a 88 y.o. female who has Allergic rhinitis; Intrinsic asthma; Laryngopharyngeal reflux disease; CHOLECYSTECTOMY, HX OF; Family history of malignant neoplasm of gastrointestinal tract; History of colonic polyps; Stricture and stenosis of esophagus; TIA (transient ischemic attack); HLD (hyperlipidemia); Headache on top of head; Essential hypertension; Neck pain; Low back pain; Pleurisy; Dyslipidemia; Chest pain; Abnormal EKG; Myalgia; Thrush; Palpitations; Keratoconjunctivitis sicca of both eyes not due to Sjogren's syndrome; Bilateral sciatica; Anemia; Leucopenia; Duffy-Null Associated Neutrophil Count [facility]); Stenosing tenosynovitis; CKD stage 3b, GFR 30-44 ml/min (HCC); Postmenopausal estrogen deficiency; and Anaphylactic syndrome on their problem list..   She  has a past medical history of Arthritis, Asthma, Blood clotting disorder (HCC), Cataract, COPD (chronic obstructive pulmonary disease) (HCC), Depression, Diverticulosis (10-2011), GERD (gastroesophageal reflux disease), Hemorrhoids, internal, colonic polyp, Hyperlipemia, Hypertension, IBS (irritable bowel syndrome), Stricture of esophagus (10-2011), and TIA (transient ischemic attack)..   She presents with chief complaint of Follow-up .   Discussed the use of AI scribe software for clinical note transcription with the patient, who gave verbal consent to proceed.  History of Present Illness Barbara  F [name] is an 88 year old female with chronic rhinitis and laryngeal reflux who presents for follow-up of her allergy symptoms and medication management.  She has experienced significant  improvement in her allergy symptoms since starting treatment. Previously, she had fluid in her head, headaches, numbness,  and a 'sizzling' sensation in her ear. The nasal spray has alleviated the fluid and headaches, although the ear sensation has returned. She no longer hears fluid when turning her head.  She is inquiring about the duration of use for her nasal spray and Protonix  (pantoprazole ), which she takes for laryngeal reflux. She is currently on pantoprazole  40 mg and has recently picked up a prescription but is unsure of the remaining refills. The reflux was thought to contribute to her rhinitis symptoms.  She is using Symbicort  80/4.5 mcg, two puffs twice daily, and montelukast , which has improved her breathing. No chest pain or shortness of breath.  She mentions a previous emergency room visit where blood tests were conducted and inquires about her cholesterol levels, which were last checked in April and were reported as good.       Past Surgical History:  Procedure Laterality Date   CESAREAN SECTION  1962   LAPAROSCOPIC CHOLECYSTECTOMY  1995    Outpatient Medications Prior to Visit  Medication Sig Dispense Refill   acetaminophen  (TYLENOL ) 500 MG tablet Take 1-2 tablets (500-1,000 mg total) by mouth every 6 (six) hours as needed for headache. 30 tablet 0   albuterol  (VENTOLIN  HFA) 108 (90 Base) MCG/ACT inhaler INHALE TWO PUFFS BY MOUTH INTO LUNGS EVERY SIX HOURS AS NEEDED FOR WHEEZING AND/OR SHORTNESS OF BREATH 8.5 g 2   atorvastatin  (LIPITOR) 40 MG tablet Take 1 tablet (40 mg total) by mouth daily. 30 tablet 0   azelastine  (ASTELIN ) 0.1 % nasal spray Place 2 sprays into both nostrils 2 (two) times daily. 30 mL 12   cetirizine  (ZYRTEC  ALLERGY) 10 MG tablet Take 1 tablet (10 mg total) by mouth at bedtime. 90 tablet 3   cholecalciferol (VITAMIN D3) 25 MCG (1000 UNIT) tablet Take 1,000 Units by mouth daily.     clopidogrel  (PLAVIX ) 75 MG tablet Take 1 tablet (75 mg total) by mouth  daily. 30 tablet 0   cycloSPORINE  (RESTASIS ) 0.05 % ophthalmic emulsion Place 1 drop into both eyes 2 (two) times daily.     EPINEPHrine  0.3 mg/0.3 mL IJ SOAJ injection Inject 0.3 mg into the muscle once as needed (for a severe allergic reaction/use as directed). 1 each 1   fluticasone  (FLONASE ) 50 MCG/ACT nasal spray Place 2 sprays into both nostrils 2 (two) times daily. 18.2 mL 11   losartan  (COZAAR ) 50 MG tablet Take 50 mg by mouth daily.     montelukast  (SINGULAIR ) 10 MG tablet Take 1 tablet (10 mg total) by mouth at bedtime. 90 tablet 3   Multiple Vitamin (MULTIVITAMIN) tablet Take 1 tablet by mouth daily.      Probiotic Product (PROBIOTIC PO) Take 1 capsule by mouth daily.      Propylene Glycol (SYSTANE BALANCE) 0.6 % SOLN Place 1 drop into both eyes 4 (four) times daily as needed (dry eyes).     Respiratory Therapy Supplies (FLUTTER) DEVI Use device 2-3 times a day to break up congestion 1 each 0   Spacer/Aero-Holding Chambers (AEROCHAMBER MV) inhaler Use as instructed 1 each 0   SYMBICORT  80-4.5 MCG/ACT inhaler Inhale 2 puffs into the lungs 2 (two) times daily. 30.6 g 3   pantoprazole  (PROTONIX ) 40 MG tablet Take 1 tablet (40 mg total) by mouth daily. 30 tablet 0   No facility-administered medications prior to visit.    Family History  Problem Relation Age of Onset   Heart disease Mother    Hypertension Mother    Diabetes Mother  Asthma Father    Colon cancer Sister 34   Irritable bowel syndrome Sister    Breast cancer Other        Niece   Stomach cancer Neg Hx     Social History   Socioeconomic History   Marital status: Widowed    Spouse name: Not on file   Number of children: 1   Years of education: college   Highest education level: Not on file  Occupational History   Occupation: retired  Tobacco Use   Smoking status: Former    Current packs/day: 0.00    Average packs/day: 0.5 packs/day for 4.3 years (2.1 ttl pk-yrs)    Types: Cigarettes    Start date: 72     Quit date: 09/28/1959    Years since quitting: 64.2    Passive exposure: Never   Smokeless tobacco: Never  Vaping Use   Vaping status: Never Used  Substance and Sexual Activity   Alcohol  use: No    Alcohol /week: 0.0 standard drinks of alcohol    Drug use: No   Sexual activity: Not Currently  Other Topics Concern   Not on file  Social History Narrative   Patient is widowed with one child.   Patient is right handed.   Patient has college education.   Patient does not drink caffeine.   Social Drivers of Corporate investment banker Strain: Not on file  Food Insecurity: Low Risk  (06/08/2023)   Received from [facility]   Hunger Vital Sign    Within the past 12 months, you worried that your food would run out before you got money to buy more: Never true    Within the past 12 months, the food you bought just didn't last and you didn't have money to get more. : Never true  Recent Concern: Food Insecurity - Medium Risk (05/18/2023)   Received from [facility]   Hunger Vital Sign    Worried About Running Out of Food in the Last Year: Patient declined to answer    Ran Out of Food in the Last Year: Sometimes true  Transportation Needs: No Transportation Needs (06/08/2023)   Received from [facility]    In the past 12 months, has lack of reliable transportation kept you from medical appointments, meetings, work or from getting things needed for daily living? : No  Physical Activity: Not on file  Stress: Not on file  Social Connections: Not on file  Intimate Partner Violence: Not At Risk (04/27/2023)   Humiliation, Afraid, Rape, and Kick questionnaire    Fear of Current or Ex-Partner: No    Emotionally Abused: No    Physically Abused: No    Sexually Abused: No                                                                                                  Objective:  Physical Exam: BP 120/60   Pulse 65   Temp 98.4 F (36.9 C) (Temporal)   Ht 4' 8  (1.422 m)   Wt 126 lb 12.8 oz (57.5 kg)  SpO2 99%   BMI 28.43 kg/m    Physical Exam GENERAL: Alert, cooperative, well developed, no acute distress HEENT: Normocephalic, normal oropharynx, moist mucous membranes CHEST: Clear to auscultation bilaterally, no wheezes, rhonchi, or crackles CARDIOVASCULAR: Normal heart rate and rhythm, S1 and S2 normal without murmurs ABDOMEN: Soft, non-tender, non-distended, without organomegaly, normal bowel sounds EXTREMITIES: No cyanosis or edema NEUROLOGICAL: Cranial nerves grossly intact, moves all extremities without gross motor or sensory deficit   Physical Exam  CT HEAD WO CONTRAST ( ) Result Date: 11/17/2023 CLINICAL DATA:  Headache EXAM: CT HEAD WITHOUT CONTRAST TECHNIQUE: Contiguous axial images were obtained from the base of the skull through the vertex without intravenous contrast. RADIATION DOSE REDUCTION: This exam was performed according to the departmental dose-optimization program which includes automated exposure control, adjustment of the mA and/or kV according to patient size and/or use of iterative reconstruction technique. COMPARISON:  None Available. FINDINGS: Brain: Normal anatomic configuration. Parenchymal volume loss is commensurate with the patient's age. Mild periventricular white matter changes are present likely reflecting the sequela of small vessel ischemia. No abnormal intra or extra-axial mass lesion or fluid collection. No abnormal mass effect or midline shift. No evidence of acute intracranial hemorrhage or infarct. Ventricular size is normal. Cerebellum unremarkable. Vascular: No asymmetric hyperdense vasculature at the skull base. Skull: Intact Sinuses/Orbits: Paranasal sinuses are clear. Orbits are unremarkable. Other: Mastoid air cells and middle ear cavities are clear. IMPRESSION: 1. No acute intracranial abnormality. 2. Mild periventricular white matter changes likely reflecting the sequela of small vessel ischemia.  Electronically Signed   By: Worthy Heads M.D.   On: 11/17/2023 02:02   DG Chest 2 View Result Date: 11/16/2023 CLINICAL DATA:  Shortness of breath EXAM: CHEST - 2 VIEW COMPARISON:  06/23/2023 FINDINGS: Aortic atherosclerosis. No focal airspace disease, pleural effusion or pneumothorax. Normal cardiac size. IMPRESSION: No active cardiopulmonary disease. Electronically Signed   By: Esmeralda Hedge M.D.   On: 11/16/2023 23:54    Recent Results (from the past 2160 hours)  Comprehensive metabolic panel     Status: Abnormal   Collection Time: 10/03/23 10:12 AM  Result Value Ref Range   Sodium 139 135 - 145 mEq/L   Potassium 4.0 3.5 - 5.1 mEq/L   Chloride 105 96 - 112 mEq/L   CO2 25 19 - 32 mEq/L   Glucose, Bld 78 70 - 99 mg/dL   BUN 26 (H) 6 - 23 mg/dL   Creatinine, Ser 1.61 0.40 - 1.20 mg/dL   Total Bilirubin 0.9 0.2 - 1.2 mg/dL   Alkaline Phosphatase 55 39 - 117 U/L   AST 21 0 - 37 U/L   ALT 14 0 - 35 U/L   Total Protein 6.7 6.0 - 8.3 g/dL   Albumin 4.3 3.5 - 5.2 g/dL   GFR 09.60 (L) >45.40 mL/min    Comment: Calculated using the CKD-EPI Creatinine Equation (2021)   Calcium  9.4 8.4 - 10.5 mg/dL  CBC with Differential/Platelet     Status: Abnormal   Collection Time: 10/03/23 10:12 AM  Result Value Ref Range   WBC 3.8 (L) 4.0 - 10.5 K/uL   RBC 3.93 3.87 - 5.11 Mil/uL   Hemoglobin 12.4 12.0 - 15.0 g/dL   HCT 98.1 19.1 - 47.8 %   MCV 95.0 78.0 - 100.0 fl   MCHC 33.1 30.0 - 36.0 g/dL   RDW 29.5 62.1 - 30.8 %   Platelets 234.0 150.0 - 400.0 K/uL   Neutrophils Relative % 50.4 43.0 - 77.0 %  Lymphocytes Relative 33.4 12.0 - 46.0 %   Monocytes Relative 12.1 (H) 3.0 - 12.0 %   Eosinophils Relative 3.4 0.0 - 5.0 %   Basophils Relative 0.7 0.0 - 3.0 %   Neutro Abs 1.9 1.4 - 7.7 K/uL   Lymphs Abs 1.3 0.7 - 4.0 K/uL   Monocytes Absolute 0.5 0.1 - 1.0 K/uL   Eosinophils Absolute 0.1 0.0 - 0.7 K/uL   Basophils Absolute 0.0 0.0 - 0.1 K/uL  Hemoglobin A1c     Status: None   Collection  Time: 10/03/23 10:12 AM  Result Value Ref Range   Hgb A1c MFr Bld 5.3 4.6 - 6.5 %    Comment: Glycemic Control Guidelines for People with Diabetes:Non Diabetic:  <6%Goal of Therapy: <7%Additional Action Suggested:  >8%   Lipid panel     Status: Abnormal   Collection Time: 10/03/23 10:12 AM  Result Value Ref Range   Cholesterol 173 0 - 200 mg/dL    Comment: ATP III Classification       Desirable:  < 200 mg/dL               Borderline High:  200 - 239 mg/dL          High:  > = 981 mg/dL   Triglycerides 19.1 0.0 - 149.0 mg/dL    Comment: Normal:  <478 mg/dLBorderline High:  150 - 199 mg/dL   HDL 29.56 >21.30 mg/dL   VLDL 86.5 0.0 - 78.4 mg/dL   LDL Cholesterol 696 (H) 0 - 99 mg/dL   Total CHOL/HDL Ratio 3     Comment:                Men          Women1/2 Average Risk     3.4          3.3Average Risk          5.0          4.42X Average Risk          9.6          7.13X Average Risk          15.0          11.0                       NonHDL 112.76     Comment: NOTE:  Non-HDL goal should be 30 mg/dL higher than patient's LDL goal (i.e. LDL goal of < 70 mg/dL, would have non-HDL goal of < 100 mg/dL)  TSH     Status: None   Collection Time: 10/03/23 10:12 AM  Result Value Ref Range   TSH 2.04 0.35 - 5.50 uIU/mL  Urinalysis, Routine w reflex microscopic     Status: Abnormal   Collection Time: 10/03/23 10:14 AM  Result Value Ref Range   Color, Urine YELLOW Yellow;Lt. Yellow;Straw;Dark Yellow;Amber;Green;Red;Brown   APPearance CLEAR Clear;Turbid;Slightly Cloudy;Cloudy   Specific Gravity, Urine 1.010 1.000 - 1.030   pH 6.0 5.0 - 8.0   Total Protein, Urine NEGATIVE Negative   Urine Glucose NEGATIVE Negative   Ketones, ur NEGATIVE Negative   Bilirubin Urine NEGATIVE Negative   Hgb urine dipstick TRACE-INTACT (A) Negative   Urobilinogen, UA 0.2 0.0 - 1.0   Leukocytes,Ua SMALL (A) Negative   Nitrite NEGATIVE Negative   WBC, UA 3-6/hpf (A) 0-2/hpf   RBC / HPF 0-2/hpf 0-2/hpf   Squamous Epithelial  / HPF Rare(0-4/hpf) Rare(0-4/hpf)   Bacteria,  UA Rare(<10/hpf) (A) None  Microalbumin / creatinine urine ratio     Status: None   Collection Time: 10/03/23 10:14 AM  Result Value Ref Range   Microalb, Ur <0.7 mg/dL   Creatinine,U 40.9 mg/dL   Microalb Creat Ratio Unable to calculate 0.0 - 30.0 mg/g    Comment: Unable to Calculate due to Microalbumin Result of <0.7 mg/dL  CBC with Differential/Platelet     Status: None   Collection Time: 11/17/23 12:40 AM  Result Value Ref Range   WBC 6.1 4.0 - 10.5 K/uL   RBC 3.96 3.87 - 5.11 MIL/uL   Hemoglobin 12.0 12.0 - 15.0 g/dL   HCT 81.1 91.4 - 78.2 %   MCV 91.2 80.0 - 100.0 fL   MCH 30.3 26.0 - 34.0 pg   MCHC 33.2 30.0 - 36.0 g/dL   RDW 95.6 21.3 - 08.6 %   Platelets 227 150 - 400 K/uL   nRBC 0.0 0.0 - 0.2 %   Neutrophils Relative % 65 %   Neutro Abs 4.0 1.7 - 7.7 K/uL   Lymphocytes Relative 19 %   Lymphs Abs 1.2 0.7 - 4.0 K/uL   Monocytes Relative 14 %   Monocytes Absolute 0.8 0.1 - 1.0 K/uL   Eosinophils Relative 2 %   Eosinophils Absolute 0.1 0.0 - 0.5 K/uL   Basophils Relative 0 %   Basophils Absolute 0.0 0.0 - 0.1 K/uL   Immature Granulocytes 0 %   Abs Immature Granulocytes 0.01 0.00 - 0.07 K/uL    Comment: Performed at [facility], [address], [place], [place] [address]  Comprehensive metabolic panel with GFR     Status: Abnormal   Collection Time: 11/17/23 12:40 AM  Result Value Ref Range   Sodium 139 135 - 145 mmol/L   Potassium 4.4 3.5 - 5.1 mmol/L   Chloride 102 98 - 111 mmol/L   CO2 23 22 - 32 mmol/L   Glucose, Bld 101 (H) 70 - 99 mg/dL    Comment: Glucose reference range applies only to samples taken after fasting for at least 8 hours.   BUN 25 (H) 8 - 23 mg/dL   Creatinine, Ser 9.62 (H) 0.44 - 1.00 mg/dL   Calcium  9.4 8.9 - 10.3 mg/dL   Total Protein 7.0 6.5 - 8.1 g/dL   Albumin 3.8 3.5 - 5.0 g/dL   AST 28 15 - 41 U/L   ALT 22 0 - 44 U/L   Alkaline Phosphatase 51 38 - 126 U/L   Total Bilirubin 1.1  0.0 - 1.2 mg/dL   GFR, Estimated 36 (L) >60 mL/min    Comment: (NOTE) Calculated using the CKD-EPI Creatinine Equation (2021)    Anion gap 14 5 - 15    Comment: Performed at [facility], [address], [place], [place] [address]  Resp panel by RT-PCR (RSV, Flu A&B, Covid) Anterior Nasal Swab     Status: None   Collection Time: 11/17/23 12:50 AM   Specimen: Anterior Nasal Swab  Result Value Ref Range   SARS Coronavirus 2 by RT PCR NEGATIVE NEGATIVE   Influenza A by PCR NEGATIVE NEGATIVE   Influenza B by PCR NEGATIVE NEGATIVE    Comment: (NOTE) The Xpert Xpress SARS-CoV-2/FLU/RSV plus assay is intended as an aid in the diagnosis of influenza from Nasopharyngeal swab specimens and should not be used as a sole basis for treatment. Nasal washings and aspirates are unacceptable for Xpert Xpress SARS-CoV-2/FLU/RSV testing.  Fact Sheet for Patients:  BloggerCourse.com  Fact Sheet for Healthcare Providers: SeriousBroker.it  This test is not yet approved or cleared by the [place]  FDA and has been authorized for detection and/or diagnosis of SARS-CoV-2 by FDA under an Emergency Use Authorization (EUA). This EUA will remain in effect (meaning this test can be used) for the duration of the COVID-19 declaration under Section 564(b)(1) of the Act, 21 U.S.C. section 360bbb-3(b)(1), unless the authorization is terminated or revoked.     Resp Syncytial Virus by PCR NEGATIVE NEGATIVE    Comment: (NOTE) Fact Sheet for Patients: BloggerCourse.com  Fact Sheet for Healthcare Providers: SeriousBroker.it  This test is not yet approved or cleared by the [place]  FDA and has been authorized for detection and/or diagnosis of SARS-CoV-2 by FDA under an Emergency Use Authorization (EUA). This EUA will remain in effect (meaning this test can be used) for the duration of the COVID-19  declaration under Section 564(b)(1) of the Act, 21 U.S.C. section 360bbb-3(b)(1), unless the authorization is terminated or revoked.  Performed at [facility], [address] [place], [place] [address]         Carnell Christian, MD, MS

## 2023-12-12 ENCOUNTER — Ambulatory Visit

## 2023-12-12 DIAGNOSIS — Z780 Asymptomatic menopausal state: Secondary | ICD-10-CM | POA: Diagnosis not present

## 2023-12-13 ENCOUNTER — Ambulatory Visit: Admitting: Podiatry

## 2023-12-13 ENCOUNTER — Ambulatory Visit: Payer: Self-pay | Admitting: Family Medicine

## 2023-12-13 ENCOUNTER — Encounter: Payer: Self-pay | Admitting: Podiatry

## 2023-12-13 DIAGNOSIS — M810 Age-related osteoporosis without current pathological fracture: Secondary | ICD-10-CM | POA: Insufficient documentation

## 2023-12-13 DIAGNOSIS — B351 Tinea unguium: Secondary | ICD-10-CM | POA: Diagnosis not present

## 2023-12-13 DIAGNOSIS — Z7901 Long term (current) use of anticoagulants: Secondary | ICD-10-CM | POA: Diagnosis not present

## 2023-12-13 DIAGNOSIS — M79675 Pain in left toe(s): Secondary | ICD-10-CM | POA: Diagnosis not present

## 2023-12-13 DIAGNOSIS — L84 Corns and callosities: Secondary | ICD-10-CM

## 2023-12-13 DIAGNOSIS — M79674 Pain in right toe(s): Secondary | ICD-10-CM | POA: Diagnosis not present

## 2023-12-13 NOTE — Progress Notes (Signed)
  Subjective:  Patient ID: Barbara  [name], female    DOB: [birth year],  MRN: [id number]  Chief Complaint  Patient presents with   Nail Problem    RFC    88 y.o. female presents with the above complaint. History confirmed with patient. Patient presenting with pain related to dystrophic thickened elongated nails. Patient is unable to trim own nails related to nail dystrophy and/or mobility issues. Patient does not have a history of T2DM. She does have history of PAD and does report taking plavix . Patient does have callus present located at the right sub 5th and 2nd met heads causing pain.    Objective:  Physical Exam: warm to cool, diminished capillary refill, pedal skin atrophic, pedal hair growth absent nail exam onychomycosis of the toenails protective sensation intact, DP and PT pulses very faint, subjective paresthesias Left Foot:  Pain with palpation of nails due to elongation and dystrophic growth.  Right Foot: Pain with palpation of nails due to elongation and dystrophic growth.  Right foot preulcerative callus right subfifth metatarsal head, subsecond metatarsal head. Assessment:   1. Chronic anticoagulation   2. Pain due to onychomycosis of toenails of both feet   3. Callus      Plan:  Patient was evaluated and treated and all questions answered.  #Hyperkeratotic lesions/pre ulcerative calluses present subfifth and subsecond metatarsal head right foot All symptomatic hyperkeratoses x 2 separate lesions were safely debrided as a courtesy with a sterile #312 blade to patient's level of comfort without incident. We discussed preventative and palliative care of these lesions including supportive and accommodative shoegear, padding, prefabricated and custom molded accommodative orthoses, use of a pumice stone and lotions/creams daily. - High risk footcare due to diminished pulses, paresthesias to feet, chronic Plavix  use causing coagulation defect  #Onychomycosis with pain   -Nails palliatively debrided as below. 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 Instrumentation: Nail nipper, rotary burr. Number of Nails: 10    Return in about 3 months (around 03/14/2024) for Routine Foot Care.         [name], DPM [facility] / [facility]

## 2023-12-19 ENCOUNTER — Telehealth: Payer: Self-pay | Admitting: Neurology

## 2023-12-19 NOTE — Telephone Encounter (Signed)
 Pt has called to r/s due to a conflict

## 2023-12-24 ENCOUNTER — Ambulatory Visit: Payer: Self-pay

## 2023-12-24 NOTE — Progress Notes (Unsigned)
 Barbara  JACQUELENE [name], female    DOB: 1935/07/05    MRN: [id number]   Brief patient profile:  70  yobf  quit smoking 1961 with  Byrum pt  pt self-referred acutely  to pulmonary clinic    12/26/2023  for asthma  starting in childhood that persisted and  blossomed  around her 73s with allergy w/u planned for Aug 2025      History of Present Illness  12/26/2023  Pulmonary/ ACUTE  office eval/ [name] / [place]  Office on symbicort  80 /  Chief Complaint  Patient presents with   Follow-up    Cough. Pt states the cough has been present for roughly 2 weeks. Has gotten worse over 2 weeks.  Pt states she is producing cloudy phlegm. Worse at  night.  Dyspnea:  walking inside house  Cough: worse when lie down/ keeps her up x sev weeks Sleep: bed is electricx  x 30 degrees SABA use: twice daily  02 ldz:wnwz  LDSCT:none   No obvious day to day or daytime pattern/variability or assoc excess/ purulent sputum or mucus plugs or hemoptysis or cp or chest tightness, subjective wheeze or overt   hb symptoms.    Also denies any obvious fluctuation of symptoms with weather or environmental changes or other aggravating or alleviating factors except as outlined above   No unusual exposure hx or h/o childhood pna  or knowledge of premature birth.  Current Allergies, Complete Past Medical History, Past Surgical History, Family History, and Social History were reviewed in [employer] record.  ROS  The following are not active complaints unless bolded Hoarseness, sore throat, dysphagia, dental problems, itching, sneezing,  nasal congestion or discharge of excess mucus or purulent secretions, ear ache,   fever, chills, sweats, unintended wt loss or wt gain, classically pleuritic or exertional cp,  orthopnea pnd or arm/hand swelling  or leg swelling, presyncope, palpitations, abdominal pain, anorexia, nausea, vomiting, diarrhea  or change in bowel habits or change in bladder habits, change in stools or  change in urine, dysuria, hematuria,  rash, arthralgias, visual complaints, headache, numbness, weakness or ataxia or problems with walking or coordination,  change in mood or  memory.            Outpatient Medications Prior to Visit  Medication Sig Dispense Refill   acetaminophen  (TYLENOL ) 500 MG tablet Take 1-2 tablets (500-1,000 mg total) by mouth every 6 (six) hours as needed for headache. 30 tablet 0   albuterol  (VENTOLIN  HFA) 108 (90 Base) MCG/ACT inhaler INHALE TWO PUFFS BY MOUTH INTO LUNGS EVERY SIX HOURS AS NEEDED FOR WHEEZING AND/OR SHORTNESS OF BREATH 8.5 g 2   atorvastatin  (LIPITOR) 40 MG tablet Take 1 tablet (40 mg total) by mouth daily. 30 tablet 0   azelastine  (ASTELIN ) 0.1 % nasal spray Place 2 sprays into both nostrils 2 (two) times daily. 30 mL 12   cetirizine  (ZYRTEC  ALLERGY) 10 MG tablet Take 1 tablet (10 mg total) by mouth at bedtime. 90 tablet 3   cholecalciferol (VITAMIN D3) 25 MCG (1000 UNIT) tablet Take 1,000 Units by mouth daily.     clopidogrel  (PLAVIX ) 75 MG tablet Take 1 tablet (75 mg total) by mouth daily. 30 tablet 0   cycloSPORINE  (RESTASIS ) 0.05 % ophthalmic emulsion Place 1 drop into both eyes 2 (two) times daily.     EPINEPHrine  0.3 mg/0.3 mL IJ SOAJ injection Inject 0.3 mg into the muscle once as needed (for a severe allergic reaction/use as directed). 1  each 1   fluticasone  (FLONASE ) 50 MCG/ACT nasal spray Place 2 sprays into both nostrils 2 (two) times daily. 18.2 mL 11   losartan  (COZAAR ) 50 MG tablet Take 50 mg by mouth daily.     montelukast  (SINGULAIR ) 10 MG tablet Take 1 tablet (10 mg total) by mouth at bedtime. 90 tablet 3   Multiple Vitamin (MULTIVITAMIN) tablet Take 1 tablet by mouth daily.      pantoprazole  (PROTONIX ) 40 MG tablet Take 1 tablet (40 mg total) by mouth daily. 90 tablet 3   Probiotic Product (PROBIOTIC PO) Take 1 capsule by mouth daily.      Propylene Glycol (SYSTANE BALANCE) 0.6 % SOLN Place 1 drop into both eyes 4 (four) times daily  as needed (dry eyes).     Respiratory Therapy Supplies (FLUTTER) DEVI Use device 2-3 times a day to break up congestion 1 each 0   Spacer/Aero-Holding Chambers (AEROCHAMBER MV) inhaler Use as instructed 1 each 0   SYMBICORT  80-4.5 MCG/ACT inhaler Inhale 2 puffs into the lungs 2 (two) times daily. 30.6 g 3   No facility-administered medications prior to visit.    Past Medical History:  Diagnosis Date   Arthritis    Asthma    Blood clotting disorder (HCC)    Cataract    COPD (chronic obstructive pulmonary disease) (HCC)    Depression    Diverticulosis 10-2011   Colonoscopy   GERD (gastroesophageal reflux disease)    Hemorrhoids, internal    Hx of colonic polyp    Hyperlipemia    Hypertension    IBS (irritable bowel syndrome)    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   EGD   TIA (transient ischemic attack)       Objective:     BP 132/62 (BP Location: Left Arm, Cuff Size: Normal)   Pulse 73   Temp 97.6 F (36.4 C) (Temporal)   Ht 4' 8 (1.422 m)   Wt 131 lb 3.2 oz (59.5 kg)   SpO2 97% Comment: RA  BMI 29.41 kg/m   SpO2: 97 % (RA)   Pleasant amb bf nad   HEENT : Oropharynx  clear   Nasal turbinates nl    NECK :  without  apparent JVD/ palpable Nodes/TM    LUNGS: no acc muscle use,  Min barrel  contour chest wall with bilateral inps/ exp rhonchi and a  few wheezes  and  with  cough on insp < exp maneuvers and min  Hyperresonant  to  percussion bilaterally    CV:  RRR  no s3 or murmur or increase in P2, and no edema   ABD:  soft and nontender with pos end  insp Hoover's  in the supine position.  No bruits or organomegaly appreciated   MS:  Nl gait/ ext warm without deformities Or obvious joint restrictions  calf tenderness, cyanosis or clubbing     SKIN: warm and dry without lesions    NEURO:  alert, approp, nl sensorium with  no motor or cerebellar deficits apparent.          I personally reviewed images and agree with radiology impression as follows:  CXR:   pa  and lateral 11/16/23 No active dz  Sinus CT 11/17/23 neg       Assessment   Asthmatic bronchitis , chronic (HCC) Quit smoking 1961 - PFT's  10/18/17   FEV1 1.13 (104 % ) ratio 0.55  p 17 % improvement from saba p 0 prior to study with DLCO  15.30 (90%)   and FV curve slt concave  - 12/26/2023  After extensive coaching inhaler device,  effectiveness =    60% with a baseline < 25 % s spacer which she does not typically take with her and excess need for saba even when better between coldss so rec zpak/ pred x 6 d and increase symbicort  to 160 and be consistent with or without spacer in terms of technique   She has features of both AB and copd but most concern are the freq flares and excess use of saba even when not flareing so rec  1) zpak/ pred x 6 d 2) try symbicort  160 with better insp technique, with or without spacer  3) proceed with allergy eval as planned   F/u with DR Byrum as per usual schedule          Each maintenance medication was reviewed in detail including emphasizing most importantly the difference between maintenance and prns and under what circumstances the prns are to be triggered using an action plan format where appropriate.  Total time for H and P, chart review, counseling, reviewing hfa/spacer device(s) and generating customized AVS unique to this office visit / same day charting = 40 min acute eval with pt new to me           Ozell America, MD 12/26/2023

## 2023-12-24 NOTE — Telephone Encounter (Signed)
 FYI pt has appt with you on 12/26/2023

## 2023-12-24 NOTE — Telephone Encounter (Addendum)
 FYI Only or Action Required?: FYI only for provider.  Patient is followed in Pulmonology for asthma, last seen on 10/24/2023 by Barbara Lamar RAMAN, MD. Called Nurse Triage reporting Shortness of Breath. Symptoms began several weeks ago. Interventions attempted: OTC medications: corcidin, Rescue inhaler, and Maintenance inhaler. Symptoms are: gradually worsening.  Triage Disposition: See Within 3 Days in Office  Patient/caregiver understands and will follow disposition?: Yes   Copied from CRM [id number]. Topic: Clinical - Red Word Triage >> Dec 24, 2023  9:17 AM [name] wrote: [facility] that prompted transfer to Nurse Triage: Experiencing breathing problems, SOB Reason for Disposition  SEVERE coughing spells (e.g., whooping sound after coughing, vomiting after coughing)  Additional Information  Commented on: Answer Assessment    Scheduled patient on the next available acute appt on December 26, 2023 with Dr. [name] .  Answer Assessment - Initial Assessment Questions E2C2 Pulmonary Triage - Initial Assessment Questions Chief Complaint (e.g., cough, sob, wheezing, fever, chills, sweat or additional symptoms) *Go to specific symptom protocol after initial questions. Cough - I cannot bring it up Wheezing Pressure in back - between shoulders  How long have symptoms been present? X 1 week Reports URI a week ago - worsened  Have you tested for COVID or Flu? Note: If not, ask patient if a home test can be taken. If so, instruct patient to call back for positive results. No  MEDICINES:   Have you used any OTC meds to help with symptoms? Yes If yes, ask What medications? coresetin  Have you used your inhalers/maintenance medication? Yes If yes, What medications? Symbicort  - 2 puffs twice daily Albuterol  PRN - Ive used it many times without relief  If inhaler, ask How many puffs and how often? Note: Review instructions on medication in the chart. See above  OXYGEN: Do you wear  supplemental oxygen? No If yes, How many liters are you supposed to use? N/a  Do you monitor your oxygen levels? No If yes, What is your reading (oxygen level) today? N/a - does not have pulse ox  What is your usual oxygen saturation reading?  (Note: Pulmonary O2 sats should be 90% or greater) N/a    1. RESPIRATORY STATUS: Describe your breathing? (e.g., wheezing, shortness of breath, unable to speak, severe coughing)      See above 2. ONSET: When did this breathing problem begin?      See above 3. PATTERN Does the difficult breathing come and go, or has it been constant since it started?      constant 4. SEVERITY: How bad is your breathing? (e.g., mild, moderate, severe)    - MILD: No SOB at rest, mild SOB with walking, speaks normally in sentences, can lie down, no retractions, pulse < 100.    - MODERATE: SOB at rest, SOB with minimal exertion and prefers to sit, cannot lie down flat, speaks in phrases, mild retractions, audible wheezing, pulse 100-120.    - SEVERE: Very SOB at rest, speaks in single words, struggling to breathe, sitting hunched forward, retractions, pulse > 120      Mild Triager does not appreciate audible SOB/wheezing during call. Pt is speaking in full sentences. Triager does appreciate coughing during call  5. RECURRENT SYMPTOM: Have you had difficulty breathing before? If Yes, ask: When was the last time? and What happened that time?      unknown 6. CARDIAC HISTORY: Do you have any history of heart disease? (e.g., heart attack, angina, bypass surgery, angioplasty)  denies 7. LUNG HISTORY: Do you have any history of lung disease?  (e.g., pulmonary embolus, asthma, emphysema)     asthma 8. CAUSE: What do you think is causing the breathing problem?      unknown 9. OTHER SYMPTOMS: Do you have any other symptoms? (e.g., dizziness, runny nose, cough, chest pain, fever)     Denies, only cough currently Endorses little fever and  runny nose in the beginning 10. O2 SATURATION MONITOR:  Do you use an oxygen saturation monitor (pulse oximeter) at home? If Yes, ask: What is your reading (oxygen level) today? What is your usual oxygen saturation reading? (e.g., 95%)       N/a 11. PREGNANCY: Is there any chance you are pregnant? When was your last menstrual period?       N/a 12. TRAVEL: Have you traveled out of the country in the last month? (e.g., travel history, exposures)       N/a  Protocols used: Breathing Difficulty-A-AH, Cough - Acute Non-Productive-A-AH

## 2023-12-26 ENCOUNTER — Encounter: Payer: Self-pay | Admitting: Internal Medicine

## 2023-12-26 ENCOUNTER — Ambulatory Visit: Admitting: Internal Medicine

## 2023-12-26 VITALS — BP 132/62 | HR 73 | Temp 97.6°F | Ht <= 58 in | Wt 131.2 lb

## 2023-12-26 DIAGNOSIS — J4489 Other specified chronic obstructive pulmonary disease: Secondary | ICD-10-CM | POA: Diagnosis not present

## 2023-12-26 MED ORDER — AZITHROMYCIN 250 MG PO TABS
ORAL_TABLET | ORAL | 0 refills | Status: DC
Start: 2023-12-26 — End: 2024-03-06

## 2023-12-26 MED ORDER — BUDESONIDE-FORMOTEROL FUMARATE 160-4.5 MCG/ACT IN AERO
INHALATION_SPRAY | RESPIRATORY_TRACT | 12 refills | Status: DC
Start: 2023-12-26 — End: 2024-03-06

## 2023-12-26 MED ORDER — PREDNISONE 10 MG PO TABS
ORAL_TABLET | ORAL | 0 refills | Status: DC
Start: 2023-12-26 — End: 2024-03-06

## 2023-12-26 NOTE — Assessment & Plan Note (Signed)
 Quit smoking 1961 - PFT's  10/18/17   FEV1 1.13 (104 % ) ratio 0.55  p 17 % improvement from saba p 0 prior to study with DLCO  15.30 (90%)   and FV curve slt concave  - 12/26/2023  After extensive coaching inhaler device,  effectiveness =    60% with a baseline < 25 % s spacer which she does not typically take with her and excess need for saba even when better between coldss so rec zpak/ pred x 6 d and increase symbicort  to 160 and be consistent with or without spacer in terms of technique   She has features of both AB and copd but most concern are the freq flares and excess use of saba even when not flareing so rec  1) zpak/ pred x 6 d 2) try symbicort  160 with better insp technique, with or without spacer  3) proceed with allergy eval as planned   F/u with DR Byrum as per usual schedule          Each maintenance medication was reviewed in detail including emphasizing most importantly the difference between maintenance and prns and under what circumstances the prns are to be triggered using an action plan format where appropriate.  Total time for H and P, chart review, counseling, reviewing hfa/spacer device(s) and generating customized AVS unique to this office visit / same day charting = 40 min acute eval with pt new to me

## 2023-12-26 NOTE — Patient Instructions (Addendum)
 Plan A = Automatic = Always=    symbicort  increase to 160 Take 2 puffs first thing in am and then another 2 puffs about 12 hours later.   Work on inhaler technique:  relax and gently blow all the way out then take a nice smooth full deep breath back in, triggering the inhaler at same time you start breathing in.  Hold breath in for at least  5 seconds if you can. Blow out symbicort   thru nose. Rinse and gargle with water when done.  If mouth or throat bother you at all,  try brushing teeth/gums/tongue with arm and hammer toothpaste/ make a slurry and gargle and spit out.     Plan B = Backup (to supplement plan A, not to replace it) Only use your albuterol  inhaler as a rescue medication to be used if you can't catch your breath by resting or doing a relaxed purse lip breathing pattern.  - The less you use it, the better it will work when you need it. - Ok to use the inhaler up to 2 puffs  every 4 hours if you must but call for appointment if use goes up over your usual need - Don't leave home without it !!  (think of it like the spare tire for your car)   Zpak  Prednisone  10 mg take  4 each am x 2 days,   2 each am x 2 days,  1 each am x 2 days and stop   Keep appt with allergy as planned

## 2024-01-01 ENCOUNTER — Ambulatory Visit: Payer: Medicare Other | Admitting: Neurology

## 2024-01-02 ENCOUNTER — Ambulatory Visit

## 2024-01-02 ENCOUNTER — Ambulatory Visit: Payer: Self-pay

## 2024-01-02 ENCOUNTER — Ambulatory Visit: Admitting: Internal Medicine

## 2024-01-02 ENCOUNTER — Encounter: Payer: Self-pay | Admitting: Internal Medicine

## 2024-01-02 VITALS — BP 149/80 | HR 68 | Ht <= 58 in | Wt 128.0 lb

## 2024-01-02 DIAGNOSIS — J069 Acute upper respiratory infection, unspecified: Secondary | ICD-10-CM

## 2024-01-02 DIAGNOSIS — J4540 Moderate persistent asthma, uncomplicated: Secondary | ICD-10-CM

## 2024-01-02 MED ORDER — ALBUTEROL SULFATE (2.5 MG/3ML) 0.083% IN NEBU
2.5000 mg | INHALATION_SOLUTION | RESPIRATORY_TRACT | Status: AC
  Administered 2024-01-02: 2.5 mg via RESPIRATORY_TRACT

## 2024-01-02 MED ORDER — ALBUTEROL SULFATE (2.5 MG/3ML) 0.083% IN NEBU: 2.5000 mg | INHALATION_SOLUTION | Freq: Four times a day (QID) | RESPIRATORY_TRACT | 12 refills | Status: AC | PRN

## 2024-01-02 MED ORDER — HYDROCODONE BIT-HOMATROP MBR 5-1.5 MG/5ML PO SOLN: 5.0000 mL | Freq: Four times a day (QID) | ORAL | 0 refills | Status: AC | PRN

## 2024-01-02 NOTE — Progress Notes (Signed)
 [name]    [id number]    [birth year]  Primary Care Physician:Sebastian Beverley NOVAK, MD Date of Appointment: 01/02/2024 Established Patient Visit  Chief complaint:   Chief Complaint  Patient presents with   Cough    Patient states had cough for about 4wks      HPI: [name] is a 88 y.o. woman with history of tobacco use disorder and asthma. Patient of Dr. Shelah.   Interval Updates: Here for acute visit. Cough, shortness of breath, nasal congestion and drainage x 4 weeks.   Saw Dr. [name] for same issue last week. Took prednisone  and antibiotic. This did help. She   Has cough that is hard to bring up. Feeling weak.   Takes symbicort  2 puffs twice daily and albuterol  prn up to 4 times a day. Doesn't feel it is helping. The symbicort  is helping.   No longer taking over counter remedies.   She has a nebulizer machine but no medicine.   I have reviewed the patient's family social and past medical history and updated as appropriate.   Past Medical History:  Diagnosis Date   Arthritis    Asthma    Blood clotting disorder (HCC)    Cataract    COPD (chronic obstructive pulmonary disease) (HCC)    Depression    Diverticulosis 10-2011   Colonoscopy   GERD (gastroesophageal reflux disease)    Hemorrhoids, internal    Hx of colonic polyp    Hyperlipemia    Hypertension    IBS (irritable bowel syndrome)    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   EGD   TIA (transient ischemic attack)     Past Surgical History:  Procedure Laterality Date   CESAREAN SECTION  1962   LAPAROSCOPIC CHOLECYSTECTOMY  1995    Family History  Problem Relation Age of Onset   Heart disease Mother    Hypertension Mother    Diabetes Mother    Asthma Father    Colon cancer Sister 58   Irritable bowel syndrome Sister    Breast cancer Other        Niece   Stomach cancer Neg Hx     Social History   Occupational History   Occupation: retired  Tobacco Use   Smoking status:  Former    Current packs/day: 0.00    Average packs/day: 0.5 packs/day for 4.3 years (2.1 ttl pk-yrs)    Types: Cigarettes    Start date: 32    Quit date: 09/28/1959    Years since quitting: 64.3    Passive exposure: Never   Smokeless tobacco: Never  Vaping Use   Vaping status: Never Used  Substance and Sexual Activity   Alcohol  use: No    Alcohol /week: 0.0 standard drinks of alcohol    Drug use: No   Sexual activity: Not Currently     Physical Exam: Blood pressure (!) 149/80, pulse 68, height 4' 7 (1.397 m), weight 128 lb (58.1 kg), SpO2 98%.  Gen:      No acute distress, frequent cough ENT:  wheezing best auscultated at upper airway, no nasal polyps, mucus membranes moist Lungs:    No increased respiratory effort, symmetric chest wall excursion, clear to auscultation bilaterally, transmitted wheezing CV:         Regular rate and rhythm; no murmurs, rubs, or gallops.  No pedal edema   Data Reviewed: Imaging: I have personally reviewed the chest xray May 2025 - no acute process.  PFTs:     Latest Ref Rng & Units 10/18/2017   10:37 AM  PFT Results  FVC-Pre L 1.91   FVC-Predicted Pre % 134   FVC-Post L 2.08   FVC-Predicted Post % 146   Pre FEV1/FVC % % 50   Post FEV1/FCV % % 54   FEV1-Pre L 0.96   FEV1-Predicted Pre % 89   FEV1-Post L 1.13   DLCO uncorrected ml/min/mmHg 15.30   DLCO UNC% % 90   DLVA Predicted % 95   TLC L 5.22   TLC % Predicted % 123   RV % Predicted % 157    I have personally reviewed the patient's PFTs and mild airflow limitation with air trapping.   Labs: Lab Results  Component Value Date   NA 139 11/17/2023   K 4.4 11/17/2023   CO2 23 11/17/2023   GLUCOSE 101 (H) 11/17/2023   BUN 25 (H) 11/17/2023   CREATININE 1.43 (H) 11/17/2023   CALCIUM  9.4 11/17/2023   GFR 44.61 (L) 10/03/2023   GFRNONAA 36 (L) 11/17/2023   Lab Results  Component Value Date   WBC 6.1 11/17/2023   HGB 12.0 11/17/2023   HCT 36.1 11/17/2023   MCV 91.2  11/17/2023   PLT 227 11/17/2023    Immunization status: Immunization History  Administered Date(s) Administered   Fluad Quad(high Dose 65+) 03/11/2022   Influenza Inj Mdck Quad Pf 06/16/2018   Influenza, High Dose Seasonal PF 05/29/2023   Influenza,inj,Quad PF,6+ Mos 04/01/2019   Influenza,inj,Quad PF,6-35 Mos 06/03/2018   Influenza-Unspecified 04/19/2020, 03/03/2022   PFIZER Comirnaty(Gray Top)Covid-19 Tri-Sucrose Vaccine 03/28/2023   PFIZER(Purple Top)SARS-COV-2 Vaccination 09/02/2019, 09/23/2019, 10/14/2019, 04/19/2020, 05/04/2020   PPD Test 11/04/2019   Pfizer(Comirnaty)Fall Seasonal Vaccine 12 years and older 03/31/2022   Pneumococcal Conjugate-13 03/20/2016   Pneumococcal Polysaccharide-23 12/23/2004   Respiratory Syncytial Virus Vaccine,Recomb Aduvanted(Arexvy) 04/22/2022   Td 12/23/2004   Zoster Recombinant(Shingrix) 08/28/2023    External Records Personally Reviewed: pumonary  Assessment:  Acute viral URI Moderate persistent asthma with exacerbation.   Plan/Recommendations:  Sorry to hear you have not been feeling well.    You have a respiratory infection which is still resolving.  It can take up to 8 weeks for the cough to fully go away.  Most of your cough right now is related to postnasal drainage.  You can try some saline nasal spray over-the-counter to help move this along.  I am sending some cough syrup to your pharmacy.  This cough syrup has hydrocodone  in it and it can make you sleepy.  So do not drive after using the syrup.  Reserve it for nighttime to help you sleep.  I gave you an albuterol  breathing treatment the office today and I have sent additional albuterol  nebulizer treatments to your pharmacy.  You can take these up to 4 times a day as needed for wheezing, chest tightness, shortness of breath.  Continue Symbicort  2 puffs twice a day.  I will call you if you need any further treatment for this based on your chest x-ray today.   Return to  Care: Follow up with Dr. Shelah as scheduled in September.    Verdon Gore, MD Pulmonary and Critical Care Medicine [facility] [phone]

## 2024-01-02 NOTE — Telephone Encounter (Signed)
 FYI Only or Action Required?: FYI only for provider.  Patient is followed in Pulmonology for asthma, last seen on 12/26/2023 by [name] Ozell NOVAK, MD.  Called Nurse Triage reporting Shortness of Breath and Cough.  Symptoms began several weeks ago.  Interventions attempted: Prescription medications: Z-pak and prednisone , Rescue inhaler, and Maintenance inhaler.  Symptoms are: unchanged.  Triage Disposition: See HCP Within 4 Hours (Or PCP Triage)  Patient/caregiver understands and will follow disposition?: Yes                             Copied from CRM [id number]. Topic: Clinical - Red Word Triage >> Jan 02, 2024  8:24 AM Russell PARAS wrote: Red Word that prompted transfer to Nurse Triage:   Lungs feeling congested SOB, unable to be physically active for longer than a few minutes without difficulty breathing No chest pain  Finished regimen prescribed by Wert on 07/09, still having symptoms. Reason for Disposition  [1] MILD difficulty breathing (e.g., minimal/no SOB at rest, SOB with walking, pulse < 100) AND [2] still present when not coughing  Answer Assessment - Initial Assessment Questions E2C2 Pulmonary Triage - Initial Assessment Questions  1. Chief Complaint (e.g., cough, sob, wheezing, fever, chills, sweat or additional symptoms) *Go to specific symptom protocol after initial questions. Productive cough and mild SOB   2. How long have symptoms been present? 3-4 weeks   3. Have you used your inhalers/maintenance medication? Albuterol  3-4 x per day, Symbicort  q12h, Z-pak and prednisone  OV on 7/9 and symptoms are persisting after medication regimen  4. Do you wear supplemental oxygen?  Denies   5. Do you monitor your oxygen levels? Denies    1. ONSET: When did the cough begin?      3-4 weeks 2. SEVERITY: How bad is the cough today?      Frequent coughing spells that are making her feel weak 3. SPUTUM: Describe the color of your  sputum (e.g., none, dry cough; clear, white, yellow, green)     Clear 5. DIFFICULTY BREATHING: Are you having difficulty breathing? If Yes, ask: How bad is it? (e.g., mild, moderate, severe)      SOB upon exertion and during coughing spells, states she can breathe normally once she coughs congestion up 6. FEVER: Do you have a fever? If Yes, ask: What is your temperature, how was it measured, and when did it start?     Denies 8. LUNG HISTORY: Do you have any history of lung disease?  (e.g., pulmonary embolus, asthma, emphysema)     Asthma 10. OTHER SYMPTOMS: Do you have any other symptoms? (e.g., runny nose, wheezing, chest pain)    Wheezing, sore throat, denies chest pain  States she does not have a means to monitor O2 saturation  Protocols used: Cough - Acute Productive-A-AH

## 2024-01-02 NOTE — Addendum Note (Signed)
 Addended by: Daris Harkins M on: 01/02/2024 11:03 AM   Modules accepted: Orders

## 2024-01-02 NOTE — Patient Instructions (Addendum)
 It was a pleasure to see you today!  Please schedule follow up with Dr Shelah as scheduled in September.   Sorry to hear you have not been feeling well.    You have a respiratory infection which is still resolving.  It can take up to 8 weeks for the cough to fully go away.  Most of your cough right now is related to postnasal drainage.  You can try some saline nasal spray over-the-counter to help move this along.  I am sending some cough syrup to your pharmacy.  This cough syrup has hydrocodone  in it and it can make you sleepy.  So do not drive after using the syrup.  Reserve it for nighttime to help you sleep.  I gave you an albuterol  breathing treatment the office today and I have sent additional albuterol  nebulizer treatments to your pharmacy.  You can take these up to 4 times a day as needed for wheezing, chest tightness, shortness of breath.  Continue Symbicort  2 puffs twice a day.  I will call you if you need any further treatment for this based on your chest x-ray today.

## 2024-01-03 ENCOUNTER — Other Ambulatory Visit: Payer: Self-pay | Admitting: Emergency Medicine

## 2024-01-03 NOTE — Telephone Encounter (Signed)
 Copied from CRM [id number]. Topic: Clinical - Medication Refill >> Jan 03, 2024 11:01 AM [name] wrote: Medication: SYMBICORT  80-4.5 MCG/ACT inhaler [[id number]]  Has the patient contacted their pharmacy? Yes (Agent: If no, request that the patient contact the pharmacy for the refill. If patient does not wish to contact the pharmacy document the reason why and proceed with request.) (Agent: If yes, when and what did the pharmacy advise?)  This is the patient's preferred pharmacy:  [facility] [address], [place] - [address] [place] [address] Phone: [phone] Fax: [phone]  [facility] [place], [place] - [address] [place] [phone] Phone: [phone] Fax: [phone]  [facility] - [place], [place] - [address] [place] [address] Phone: [phone] Fax: [phone]  Is this the correct pharmacy for this prescription? Yes If no, delete pharmacy and type the correct one.   Has the prescription been filled recently? Yes  Is the patient out of the medication? Yes  Has the patient been seen for an appointment in the last year OR does the patient have an upcoming appointment? Yes  Can we respond through MyChart? No  Agent: Please be advised that Rx refills may take up to 3 business days. We ask that you follow-up with your pharmacy.

## 2024-01-07 ENCOUNTER — Ambulatory Visit: Payer: Self-pay | Admitting: Internal Medicine

## 2024-01-14 ENCOUNTER — Other Ambulatory Visit: Payer: Self-pay | Admitting: Family Medicine

## 2024-01-14 NOTE — Telephone Encounter (Signed)
 Copied from CRM [id number]. Topic: Clinical - Medication Refill >> Jan 14, 2024 10:28 AM Pinkey ORN wrote: Medication: clopidogrel  (PLAVIX ) 75 MG tablet  Has the patient contacted their pharmacy? Yes (Agent: If no, request that the patient contact the pharmacy for the refill. If patient does not wish to contact the pharmacy document the reason why and proceed with request.) (Agent: If yes, when and what did the pharmacy advise?)  This is the patient's preferred pharmacy:  [facility] [address] [place] - [address] [place] [address] Phone: [phone] Fax: [phone]   Is this the correct pharmacy for this prescription? Yes If no, delete pharmacy and type the correct one.   Has the prescription been filled recently? No  Is the patient out of the medication? Yes  Has the patient been seen for an appointment in the last year OR does the patient have an upcoming appointment? Yes  Can we respond through MyChart? Yes  Agent: Please be advised that Rx refills may take up to 3 business days. We ask that you follow-up with your pharmacy.

## 2024-01-14 NOTE — Therapy (Signed)
 OUTPATIENT PHYSICAL THERAPY LOWER EXTREMITY EVALUATION   Patient Name: [name] MRN: [id number] DOB:02-10-1936, 88 y.o., female Today's Date: 01/15/2024  END OF SESSION:  PT End of Session - 01/15/24 1724     Visit Number 1    Date for PT Re-Evaluation 03/11/24    Authorization Type [employer] Medicare    Authorization Time Period (auth requested)    Progress Note Due on Visit 10    PT Start Time 1533    PT Stop Time 1603    PT Time Calculation (min) 30 min    Activity Tolerance Patient tolerated treatment well    Behavior During Therapy WFL for tasks assessed/performed          Past Medical History:  Diagnosis Date   Arthritis    Asthma    Blood clotting disorder (HCC)    Cataract    COPD (chronic obstructive pulmonary disease) (HCC)    Depression    Diverticulosis 10-2011   Colonoscopy   GERD (gastroesophageal reflux disease)    Hemorrhoids, internal    Hx of colonic polyp    Hyperlipemia    Hypertension    IBS (irritable bowel syndrome)    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   EGD   TIA (transient ischemic attack)    Past Surgical History:  Procedure Laterality Date   CESAREAN SECTION  1962   LAPAROSCOPIC CHOLECYSTECTOMY  1995   Patient Active Problem List   Diagnosis Date Noted   Asthmatic bronchitis , chronic (HCC) 12/26/2023   Age-related osteoporosis without current pathological fracture 12/13/2023   Anaphylactic syndrome 11/29/2023   Duffy-Null Associated Neutrophil Count (DNANC) 10/09/2023   Stenosing tenosynovitis 10/09/2023   CKD stage 3b, GFR 30-44 ml/min (HCC) 10/09/2023   Postmenopausal estrogen deficiency 10/09/2023   Keratoconjunctivitis sicca of both eyes not due to Sjogren's syndrome 09/11/2023   Bilateral sciatica 09/11/2023   Anemia 09/11/2023   Leucopenia 09/11/2023   Palpitations 04/28/2023   Myalgia 07/21/2019   Thrush 07/21/2019   Dyslipidemia 05/24/2018   Chest pain 05/24/2018   Abnormal EKG 05/24/2018   Pleurisy 09/13/2017    Neck pain 10/21/2014   Low back pain 10/21/2014   Essential hypertension 07/15/2014   HLD (hyperlipidemia) 03/24/2014   Headache on top of head 03/24/2014   TIA (transient ischemic attack) 03/13/2014   Stricture and stenosis of esophagus 12/13/2011   Family history of malignant neoplasm of gastrointestinal tract 11/01/2011   History of colonic polyps 11/01/2011   Allergic rhinitis 09/17/2008   Intrinsic asthma 09/17/2008   Laryngopharyngeal reflux disease 09/17/2008   CHOLECYSTECTOMY, HX OF 09/17/2008    PCP: Sebastian Beverley NOVAK, MD  REFERRING PROVIDER: Sebastian Beverley NOVAK, MD  REFERRING DIAG:  Diagnosis  M81.0 (ICD-10-CM) - Age-related osteoporosis without current pathological fracture    THERAPY DIAG:  Muscle weakness (generalized)  Other low back pain  Abnormal posture  Rationale for Evaluation and Treatment: Rehabilitation  ONSET DATE: 12/12/2023  SUBJECTIVE:   SUBJECTIVE STATEMENT: Patient presents with age related osteoporosis. The MD did not explain to her what is. She reports having degenerative joint disease in her back and she wears a back brace everyday. She wears a back brace when she lifts things like pots. She sometimes has tinglinh that goes down her legs. Her back pain started when she got an epidural during child birth.  PERTINENT HISTORY: Osteoporosis; OA; asthma; COPD; depression; HTN; see DEXA results below PAIN:  Are you having pain? Yes: NPRS scale: 8(currently) 9(worst) /10 Pain location: lumbar spine into  buttocks Pain description: achy; sharp Aggravating factors: bending; walking ; lifting Relieving factors: medication; forward trunk bend to touch her toes  PRECAUTIONS: None  RED FLAGS: None   WEIGHT BEARING RESTRICTIONS: No  FALLS:  Has patient fallen in last 6 months? No  LIVING ENVIRONMENT: Lives with: lives with their son Lives in: House/apartment Stairs: Yes stairs but she does not use them. She stays on the first floor Has  following equipment at home: None  OCCUPATION: Retired  PLOF: Independent, Independent with basic ADLs, Independent with household mobility without device, Independent with community mobility without device, Independent with gait, and Independent with transfers  PATIENT GOALS: To maintain independence  NEXT MD VISIT: October 2025  OBJECTIVE:  Note: Objective measures were completed at Evaluation unless otherwise noted.  DIAGNOSTIC FINDINGS: DEXA 6/25 LEFT FEMORAL NECK: T-score: -1.9  LEFT TOTAL HIP: T-score: -1.3   RIGHT FEMORAL NECK: T-score: -2.4  RIGHT TOTAL HIP: T-score: -1.6  LEFT FOREARM (RADIUS 33%): T-score: -2.5   PATIENT SURVEYS:  Modified Oswestry: 13/50 26% moderate disability  Minimally Clinically Important Difference (MCID) = 12.8%  COGNITION: Overall cognitive status: Within functional limits for tasks assessed     SENSATION: Patient has occasional tingling in her legs   MUSCLE LENGTH: Hamstrings:decreased bilateral   POSTURE: rounded shoulders and forward head   LOWER EXTREMITY ROM:WFL bilateral    LOWER EXTREMITY MMT:  MMT Right eval Left eval  Hip flexion 4- 4-  Hip extension    Hip abduction 4- 4-  Hip adduction 4- 4-  Hip internal rotation    Hip external rotation    Knee flexion 4 4  Knee extension 4 4  Ankle dorsiflexion    Ankle plantarflexion    Ankle inversion    Ankle eversion     (Blank rows = not tested)   FUNCTIONAL TESTS:  5 times sit to stand: 24.91 sec with UE support Timed up and go (TUG): 10.85 sec  GAIT:  Comments: decreased cadence; narrow BOS                                                                                                                                TREATMENT DATE:  01/15/2024 Initial Evaluation & HEP created  Reviewed Osteoporosis handout. Provided patient with a copy and added it to patient instructions. Spoke about patient's back pain and how we can address with with  therapy  PATIENT EDUCATION:  Education details: Provided patient education and discussion on quality of life and stress management. Discussed motivators, challenges, and goals for future.  Person educated: Patient Education method: Explanation, Demonstration, and Handouts Education comprehension: verbalized understanding, returned demonstration, and needs further education  HOME EXERCISE PROGRAM: Access Code: [email or web address] URL: [email or web address] Date: 01/15/2024 Prepared by: Kristeen Sar  Exercises - Seated Hamstring Stretch  - 1-2 x daily - 7 x weekly - 2 sets - 20-30s hold - Seated Figure 4 Piriformis Stretch  - 1-2 x daily -  7 x weekly - 2 sets - 20-30 hold - Sit to Stand with Armchair  - 1-2 x daily - 7 x weekly - 3 sets - 10 reps - Side Stepping with Resistance at Thighs  - 1-2 x daily - 7 x weekly - 3 sets - 10 reps - Heel Raises with Counter Support  - 1-2 x daily - 7 x weekly - 1-2 sets - 10 reps  ASSESSMENT:  CLINICAL IMPRESSION: Patient is a 88 y.o. female who was seen today for physical therapy evaluation and treatment for age related osteoporosis. Sherman  presents to skilled therapy with osteoporosis and back pain. Reviewed patient's T-scored with her and educated how physical therapy can help. Provided osteoporosis educational handout and reviewed it with patient. Patient verbalized increased back pain as well so educated how we can address that with physical therapy. Based on evaluation noted decreased functional mobility, muscle weakness, and poor posture. Patient is motivated and wants to get better and maintain independence. Patient will benefit from skilled PT to address the below impairments and improve overall function.   OBJECTIVE IMPAIRMENTS: Abnormal gait, decreased balance, decreased mobility, decreased strength, hypomobility, increased muscle spasms, impaired flexibility, improper body mechanics, postural dysfunction, and pain.   ACTIVITY  LIMITATIONS: lifting, bending, standing, squatting, sleeping, stairs, transfers, and locomotion level  PARTICIPATION LIMITATIONS: meal prep, cleaning, laundry, driving, shopping, and community activity  PERSONAL FACTORS: Age, Fitness, Time since onset of injury/illness/exacerbation, and 3+ comorbidities: Osteoporosis; OA; asthma; COPD; depression; HTN;  are also affecting patient's functional outcome.   REHAB POTENTIAL: Good  CLINICAL DECISION MAKING: Evolving/moderate complexity  EVALUATION COMPLEXITY: Moderate   GOALS: Goals reviewed with patient? Yes  SHORT TERM GOALS: Target date: 02/12/2024  Patient will be independent with initial HEP. Baseline:  Goal status: INITIAL  2.  Patient will demonstrate improved upright posture and spinal alignment during standing and sitting task to reduce mechanical strain.  Baseline:  Goal status: INITIAL  3.   Patient will be able to participate in 3/6 minute walk test to establish a baseline measurement. Baseline:  Goal status: INITIAL  4.  Patient will perform 5STS in < or = to 20 sec due to improved LE strength and functional mobility to maintain independence. Baseline: 24.91 sec Goal status: INITIAL   LONG TERM GOALS: Target date: 03/11/2024  Patient will demonstrate independence in advanced HEP. Baseline:  Goal status: INITIAL  2.  Patient will report > or = to 50% improvement in back pain since starting PT. Baseline:  Goal status: INITIAL  3.  Patient demonstrate correct lifting technique to prevent spontaneous fractures and mechanical strain. Baseline:  Goal status: INITIAL   4.  Patient will score < or = to 26/50 on ODI due to decreased self perceived disability. Baseline: 13/50 26% Goal status: INITIAL  5.  Patient will perform 5STS in < or = 16 sec for improved functional mobility and independence. Baseline: 24.91 sec Goal status: INITIAL  6.  Patient will demonstrate at least 4+/5 hip strength to improve  performance of bending and lifting. Baseline:  Goal status: INITIAL   PLAN:  PT FREQUENCY: 1-2x/week  PT DURATION: 8 weeks  PLANNED INTERVENTIONS: 97164- PT Re-evaluation, 97110-Therapeutic exercises, 97530- Therapeutic activity, 97112- Neuromuscular re-education, 97535- Self Care, 02859- Manual therapy, [phone]- Gait training, [phone]- Canalith repositioning, [id number]- Aquatic Therapy, [phone]- Electrical stimulation (unattended), [phone]- Electrical stimulation (manual), [phone] (1-2 muscles), [id number] (3+ muscles)- Dry Needling, Patient/Family education, Balance training, Stair training, Taping, Joint mobilization, Joint manipulation, Vestibular training, Cryotherapy, and Moist  heat  PLAN FOR NEXT SESSION: Review HEP; NuStep; Step ups; weight bearing activities; see if patient has questions about osteoporosis handout; posture when standing, sitting and lifting   Kristeen Sar, PT 01/15/24 5:25 PM [facility] [address] [place], [place] [address] Phone # [phone] Fax [phone]

## 2024-01-15 ENCOUNTER — Encounter: Payer: Self-pay | Admitting: Physical Therapy

## 2024-01-15 ENCOUNTER — Other Ambulatory Visit: Payer: Self-pay

## 2024-01-15 ENCOUNTER — Ambulatory Visit: Attending: Family Medicine | Admitting: Physical Therapy

## 2024-01-15 DIAGNOSIS — M5459 Other low back pain: Secondary | ICD-10-CM | POA: Diagnosis present

## 2024-01-15 DIAGNOSIS — M6281 Muscle weakness (generalized): Secondary | ICD-10-CM | POA: Diagnosis present

## 2024-01-15 DIAGNOSIS — R293 Abnormal posture: Secondary | ICD-10-CM | POA: Diagnosis present

## 2024-01-15 NOTE — Patient Instructions (Signed)
 Osteoporosis   What is Osteoporosis?  A silent disease in which the skeleton is weakened by decreased bone density. Characterized by low bone mass, deterioration of bone, and increased risk of fracture postmenopausal (primary) or the result of an identifiable condition/event (secondary) Commonly found in the wrists, spine, and hips; these are high-risk stress areas and very susceptible to fractures.  The Facts: There are 1.5 million fractures/year 500,000 spine; 250,000 hip with over 60,000 nursing home admissions secondary to hip fracture; and 200,000 wrist After hip fracture, only 50% of people able to walk independently prior to the fracture return to independent ambulation. Bone mass: Peaks at age 4-30, and begins declining at age 73-50.   Osteoporosis is defined by the World Health Organization [facility]) as:  NOF/WHO Criteria for Interpreting Results of Bone Density Assessment  Results Diagnosis  Within 1 standard deviation (SD) of young adult mean Normal  Between 1 and -2.5 SD below mean, repeat in 2 years Low bone mass (osteopenia)  Greater than -2.5 SD below mean Osteoporosis  Greater than -2.5 SD below mean and one or more fragility fractures exist Severe Osteoporosis  *Results can be affected by positioning of the body in the DEXA scan, presence of current or old fractures, arthritis, extraneous calcifications.    Osteoporosis is not just a women's disease!  30-40% of women will develop osteoporosis 5-15% of males will develop osteoporosis   What are the risk factors?  Female Thin, small frame Caucasian, Asian race Early menopause (<67 years old)/amenorrhea/delayed puberty Old age Family history (fractures, stooped posture)\ Low calcium diet Sedentary lifestyle Alcohol, Caffeine, Smoking Malnutrition, GI Disease Prolonged use of Glucocorticoids (Prednisone), Meds to treat asthma, arthritis, cancers, thyroid, and anti-seizure meds.  How do I know for  sure?  Get a BONE DENSITY TEST!  This measures bone loss and it's painless, non-invasive, and only takes 5-10 minutes!  What can I do about it?  Decrease your risk factors (alcohol, caffeine, smoking) Helpful medications (see next page) Adequate Calcium and Vitamin D intake Get active! Proper posture - Sit and stand tall! No slouching or twisting Weight-Bearing Exercise - walking, stair climbing, elliptical; NO jogging or high-impact exercise. Resistive Exercise - Cybex weight equipment, Nautilus, dumbbells, therabands  **Be sure to maintain proper alignment when lifting any weight!!  **When using equipment, avoid abdominal exercises which involve "crunching" or curling or twisting the trunk, biceps machines, cross-country machines, moving handlebars, or ANY MACHINE WITH ROTATION OR FORWARD BENDING!!!           Approved Pharmacologic Management of Osteoporosis  Agent Approved for prevention Approved for treatment BMD increased spine/hip Fracture reduction  Estrogen/Hormone Therapy (Estrace, Estratab, Ogen, Premarin, Vivell, Prempro, Femhert, Orthoest) Yes Yes 3-6% 35% spine and hip  Bisphosphonates  (Fosamax, Actonel, Boniva) Yes Yes 3-8% 35-50% spine and non-spine  Calcitonin (Miacalcin, Calcimar, Fortical) No Yes 0-3% None stated  Raloxifene (Evista) Yes Yes 2-3% 30-55%  Parathyroid Hormone (Forteo) No Yes, only in those at high risk for fracture None stated 53-65%     Recommended Daily Calcium Intakes   Population Group NIH/NOF* (mg elemental calcium)  Children 1-10 years [phone]  Children 11-24 years 1200-1500  Men and Women 25-64 years At least 1200  Pregnant/Lactating At least 1200  Postmenopausal women with hormone replacement therapy At least 1200  Postmenopausal women without   hormone replacement therapy At least 1200  Men and women 65 + At least 1200  *In 1987, 1990, 1994, and 2000, the NIH held consensus conferences  on osteoporosis and calcium.   This column shows the most recent recommendations regarding calcium intak for preventing and managing osteoporosis.          Calcium Content of Selected Foods  Dairy Foods Calcium Content (mg) Non-Dairy Foods Calcium Content (mg)  Buttermilk, 1 cup 300 Calcium-fortified juice, 1 cup 300  Milk, 1 cup 300 Salmon, canned with Bones, 2 oz 100  Lactaid milk, 1 cup 300-500 Oysters, raw 13-19 medium 226  Soy milk, 1 cup 200-300 Sardines, canned with bones, 3 oz 372  Yogurt (plain, lowfat) 1 cup 250-300 Shrimp, canned 3 oz 98  Frozen yogurt (fruit) 1 cup 200-600 Collard greens, cooked 1 cup 357  Cheddar, mozzarella, or [place] cheese, 1oz 205 Broccoli, cooked 1 cup 78  Cottage cheese (lowfat) 4 oz 200 Soybeans, cooked 1 cup 131  Part-skim ricotta cheese, 4oz 335 Tofu, 4oz* *  Vanilla ice cream, 1 cup 120-300    *Calcium content of tofu varies depending on processing method; check nutritional label on package for precise calcium content.     Suggested Guidelines for Calcium Supplement Use:  Calcium is absorbed most efficiently if taken in small amounts throughout the day.  Always divide the daily dose into smaller amounts if the total daily dose is 500mg  or more per day.  The body cannot use more than 500mg  Calcium at any one time. The use of manufactured supplements is encouraged.  Calcium as bone meal or dolomite may contain lead or other heavy metals as contaminants. Calcium supplements should not be taken with high fiber meals or with bulk forming laxatives. If calcium carbonate is used as the supplement form, it should be taken with meals to assure that stomach acid production is present to facilitate optimal dissolution and absorption of calcium.  This is important if atrophic gastritis with hypo- or achlorhydria is present, which it is in 20-50% of older individuals. It is important to drink plenty of fluids while using the supplement to help reduce problems with side  effects like constipation or bloating.  If these symptoms become a problem, switching to another form of supplement may be the answer. Another alternative is calcium-fortified foods, including fruit juices, cereals, and breads.  These foods are now marketed with added calcium and may be less likely to cause side effects. Those with personal or family histories of kidney stones should be monitored to assure that hypercalcuria does not occur. CALCIUM INTAKE QUIZ  Dairy products are the primary source of calcium for most people.  For a quick estimate of your daily calcium intake, complete the following steps:  Use the chart below to determine your daily intake of calcium from diary foods. Servings of dairy per day 1 2 3 4 5 6 7 8   Milligrams (mg) of calcium: 250 [phone] 1250 1500 1750 2000   2.  Enter your total daily calcium intake from dairy foods:     _____mg  3.  Add 350 mg, which is the average for all other dietary sources:                 +            350 mg  4.  The sum of your total daily calcium intake:               ______mg  5.  Enter the recommended calcium intake for your age from the chart below;         ______mg  6.  Enter your daily intake from step 4 above and subtract:                             -        _______mg  7.  The result is how much additional calcium you need:                                          ______mg      Recommended Daily Calcium Intake  Population Calcium (mg)  Children 1-10 years [phone]  Children 11-24 years 1200-1500  Men and women 25-64 At least 1200  Pregnant/Lactating At least 1200  Postmenopausal women with hormone replacement therapy At least 1200  Postmenopausal women without hormone replacement therapy At least 1200  Men and women 65+ At least 1200       SAFETY TIPS FOR FALL PREVENTION   Remove throw rugs and make certain carpet edges are securely fastened to the floor.  Reduce clutter, especially in traffic areas of  the home.   Install/maintain sturdy handrails at stairs.  Increase wattage of lighting in hallways, bathrooms, kitchens, stairwells, and entrances to home.  Use night-lights near bed, in hallways, and in bathroom to improve night safety.  Install safety handrails in shower, tub, and around toilet.  Bathtubs and shower stalls should have non-skid surfaces.  When you must reach for something high, use a safety step stool, one with wide steps and a friction surface to stand on.  A type equipped with a high handrail is preferred.  If a cane or other walking aid has been recommended, use it to help increase your stability.  Wear supportive, cushioned, low-heeled shoes.  Avoid "scuffs" (backless bedroom slippers) and high heels.  Avoid rushing to answer a phone, doorbell, or anything else!  A portable phone that you can take from room to room with you is a good idea for security and safety.  Exercise regularly and stay active!!    Resources  National Osteoporosis [employer].NOF.org   Exercise for Osteoporosis; A Safe and Effective Way to Build Bone Density and Muscle Strength By: Myra Gianotti, M.A.

## 2024-01-16 ENCOUNTER — Other Ambulatory Visit: Payer: Self-pay | Admitting: Family

## 2024-01-16 MED ORDER — CLOPIDOGREL BISULFATE 75 MG PO TABS: 75.0000 mg | ORAL_TABLET | Freq: Every day | ORAL | 0 refills | Status: AC

## 2024-01-16 NOTE — Telephone Encounter (Signed)
 LOV 12/06/2023  FOV 04/15/2024

## 2024-01-16 NOTE — Telephone Encounter (Signed)
 Noted. Patient will be informed that RX was sent to pharmacy.

## 2024-01-23 ENCOUNTER — Ambulatory Visit: Admitting: Allergy

## 2024-01-23 ENCOUNTER — Other Ambulatory Visit: Payer: Self-pay

## 2024-01-23 ENCOUNTER — Encounter: Payer: Self-pay | Admitting: Allergy

## 2024-01-23 VITALS — BP 122/76 | HR 66 | Temp 98.1°F | Resp 15 | Ht 59.25 in | Wt 132.3 lb

## 2024-01-23 DIAGNOSIS — T7840XD Allergy, unspecified, subsequent encounter: Secondary | ICD-10-CM

## 2024-01-23 DIAGNOSIS — K219 Gastro-esophageal reflux disease without esophagitis: Secondary | ICD-10-CM | POA: Diagnosis not present

## 2024-01-23 DIAGNOSIS — J4489 Other specified chronic obstructive pulmonary disease: Secondary | ICD-10-CM | POA: Diagnosis not present

## 2024-01-23 DIAGNOSIS — J310 Chronic rhinitis: Secondary | ICD-10-CM | POA: Diagnosis not present

## 2024-01-23 NOTE — Progress Notes (Signed)
 New Patient Note  RE: [name] MRN: [id number] DOB: 1936/01/20 Date of Office Visit: 01/23/2024  Primary care provider: Sebastian Beverley NOVAK, MD  Chief Complaint: allergies  History of present illness: Barbara  F [name] is a 88 y.o. female presenting today for evaluation of seasonal rhinitis, asthma and anaphylaxis.   Discussed the use of AI scribe software for clinical note transcription with the patient, who gave verbal consent to proceed.  She has experienced two significant allergic reactions since December, both requiring emergency room visits. The most recent reaction occurred after consuming salmon seasoned with a new seasoning from [employer], identified as 'salmon rub'. Symptoms included difficulty breathing, heart palpitations, headache, nausea, and visual disturbances. She used her EpiPen , which provided partial relief, before seeking emergency care. She suspects the seasoning as the trigger, having consumed salmon without issues previously.  However she state the symptoms started about 4 to 5 hours after eating this food.  She went to ED 11/16/23 for a reaction.  She was treated with zyrtec  and prednisone .    Her history of allergic reactions includes a severe reaction years ago after ingesting aloe vera juice and accidentally swallowing a liquid that caused esophageal discomfort, leading to emergency medical intervention. She has experienced allergic reactions throughout her life, with symptoms such as difficulty breathing and swelling sensations. Known allergies include synthetic carpet, insulation materials, and mold, which have previously caused significant reactions, including swelling and emergency room visits. She avoids sugar and certain seasonings due to fear of reactions and uses garlic and onion for seasoning, avoiding onion powder due to reflux issues.  She experiences seasonal allergy symptoms such as itchy, watery eyes, rhinorrhea, nasal congestion, and  sneezing, particularly in spring and fall. She uses Zyrtec , Flonase , azelastine  nasal spray, and Singulair  to manage these symptoms.  However noting she does not use these medications all the time and has not been using any nasal sprays recently.  She does state these medications do help when she uses them.  She has a history of acid reflux, which complicates her ability to distinguish between asthma and reflux symptoms. She takes Protonix  for reflux and uses Symbicort  80mcg inhaler twice daily for asthma management. She has experienced a persistent cough following bronchitis, which was treated with a Z-Pak and prednisone .  She states with her most recent respiratory symptoms her Symbicort  was bumped up to the 160 mcg dosing but she states this was too strong and she was taken back down to the 80 mcg dosing.     Review of systems: 10pt ROS negative unless noted above in HPI  Past medical history: Past Medical History:  Diagnosis Date   Arthritis    Asthma    Blood clotting disorder (HCC)    Cataract    COPD (chronic obstructive pulmonary disease) (HCC)    Depression    Diverticulosis 10-2011   Colonoscopy   GERD (gastroesophageal reflux disease)    Hemorrhoids, internal    Hx of colonic polyp    Hyperlipemia    Hypertension    IBS (irritable bowel syndrome)    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   EGD   TIA (transient ischemic attack)     Past surgical history: Past Surgical History:  Procedure Laterality Date   CESAREAN SECTION  1962   LAPAROSCOPIC CHOLECYSTECTOMY  1995    Family history:  Family History  Problem Relation Age of Onset   Heart disease Mother    Hypertension Mother    Diabetes Mother  Asthma Father    Colon cancer Sister 63   Irritable bowel syndrome Sister    Breast cancer Other        Niece   Stomach cancer Neg Hx     Social history: Lives in a townhome without carpeting with electric heating and central cooling.  No pets in the home.  There is no  concern for water damage, mildew or roaches in the home.  She is retired.  She has no smoke exposure at this time. Tobacco Use   Smoking status: Former    Current packs/day: 0.00    Average packs/day: 0.5 packs/day for 4.3 years (2.1 ttl pk-yrs)    Types: Cigarettes    Start date: 27    Quit date: 09/28/1959    Years since quitting: 64.3    Passive exposure: Never   Smokeless tobacco: Never  Vaping Use   Vaping status: Never Used    Medication List: Current Outpatient Medications  Medication Sig Dispense Refill   acetaminophen  (TYLENOL ) 500 MG tablet Take 1-2 tablets (500-1,000 mg total) by mouth every 6 (six) hours as needed for headache. 30 tablet 0   albuterol  (PROVENTIL ) (2.5 MG/3ML) 0.083% nebulizer solution Take 3 mLs (2.5 mg total) by nebulization every 6 (six) hours as needed for wheezing or shortness of breath. 75 mL 12   albuterol  (VENTOLIN  HFA) 108 (90 Base) MCG/ACT inhaler INHALE TWO PUFFS BY MOUTH INTO LUNGS EVERY SIX HOURS AS NEEDED FOR WHEEZING AND/OR SHORTNESS OF BREATH 8.5 g 2   atorvastatin  (LIPITOR) 40 MG tablet Take 1 tablet (40 mg total) by mouth daily. 30 tablet 0   azelastine  (ASTELIN ) 0.1 % nasal spray Place 2 sprays into both nostrils 2 (two) times daily. 30 mL 12   budesonide -formoterol  (SYMBICORT ) 160-4.5 MCG/ACT inhaler Take 2 puffs first thing in am and then another 2 puffs about 12 hours later. 1 each 12   cetirizine  (ZYRTEC  ALLERGY) 10 MG tablet Take 1 tablet (10 mg total) by mouth at bedtime. 90 tablet 3   cholecalciferol (VITAMIN D3) 25 MCG (1000 UNIT) tablet Take 1,000 Units by mouth daily.     clopidogrel  (PLAVIX ) 75 MG tablet Take 1 tablet (75 mg total) by mouth daily. 90 tablet 0   cycloSPORINE  (RESTASIS ) 0.05 % ophthalmic emulsion Place 1 drop into both eyes 2 (two) times daily.     EPINEPHrine  0.3 mg/0.3 mL IJ SOAJ injection Inject 0.3 mg into the muscle once as needed (for a severe allergic reaction/use as directed). 1 each 1   fluticasone   (FLONASE ) 50 MCG/ACT nasal spray Place 2 sprays into both nostrils 2 (two) times daily. 18.2 mL 11   HYDROcodone  bit-homatropine (HYCODAN) 5-1.5 MG/5ML syrup Take 5 mLs by mouth every 6 (six) hours as needed for cough. 240 mL 0   losartan  (COZAAR ) 50 MG tablet Take 50 mg by mouth daily.     montelukast  (SINGULAIR ) 10 MG tablet Take 1 tablet (10 mg total) by mouth at bedtime. 90 tablet 3   Multiple Vitamin (MULTIVITAMIN) tablet Take 1 tablet by mouth daily.      pantoprazole  (PROTONIX ) 40 MG tablet Take 1 tablet (40 mg total) by mouth daily. 90 tablet 3   Probiotic Product (PROBIOTIC PO) Take 1 capsule by mouth daily.      Propylene Glycol (SYSTANE BALANCE) 0.6 % SOLN Place 1 drop into both eyes 4 (four) times daily as needed (dry eyes).     Respiratory Therapy Supplies (FLUTTER) DEVI Use device 2-3 times a day to  break up congestion 1 each 0   Spacer/Aero-Holding Chambers (AEROCHAMBER MV) inhaler Use as instructed 1 each 0   SYMBICORT  80-4.5 MCG/ACT inhaler Inhale 2 puffs into the lungs 2 (two) times daily. 30.6 g 3   azithromycin  (ZITHROMAX ) 250 MG tablet Take 2 on day one then 1 daily x 4 days (Patient not taking: Reported on 01/23/2024) 6 tablet 0   predniSONE  (DELTASONE ) 10 MG tablet Take  4 each am x 2 days,   2 each am x 2 days,  1 each am x 2 days and stop (Patient not taking: Reported on 01/23/2024) 14 tablet 0   No current facility-administered medications for this visit.    Known medication allergies: Allergies  Allergen Reactions   Cepacol Shortness Of Breath    Difficulty breathing   Contrast Media [Iodinated Contrast Media] Shortness Of Breath and Swelling    Pt reports I could not breath at all and my face swelled up    Iodine Shortness Of Breath   Lactose Intolerance (Gi) Shortness Of Breath    All dairy  Causes shortness of breath   Mushroom Extract Complex (Obsolete) Shortness Of Breath   Other Hives, Shortness Of Breath and Swelling    Mushrooms (All mushrooms)    Penicillins Shortness Of Breath and Swelling    DID THE REACTION INVOLVE: Swelling of the face/tongue/throat, SOB, or low BP? Yes  Sudden or severe rash/hives, skin peeling, or the inside of the mouth or nose? No Did it require medical treatment? No When did it last happen? Over 10 years ago   If all above answers are "NO", may proceed with cephalosporin use.    Sulfamethoxazole Anaphylaxis   Aspirin  Other (See Comments)    Burns stomach up   Ciprofloxacin Other (See Comments)    Other reaction(s): Unknown   Budesonide  Other (See Comments)    Headache     Physical examination: Blood pressure 122/76, pulse 66, temperature 98.1 F (36.7 C), temperature source Temporal, resp. rate 15, height 4' 11.25 (1.505 m), weight 132 lb 4.8 oz (60 kg), SpO2 94%.  General: Alert, interactive, in no acute distress. HEENT: PERRLA, TMs pearly gray, turbinates non-edematous without discharge, post-pharynx non erythematous. Neck: Supple without lymphadenopathy. Lungs: Clear to auscultation without wheezing, rhonchi or rales. {no increased work of breathing. CV: Normal S1, S2 without murmurs. Abdomen: Nondistended, nontender. Skin: Warm and dry, without lesions or rashes. Extremities:  No clubbing, cyanosis or edema. Neuro:   Grossly intact.  Diagnostics/Labs:  Spirometry: FEV1: 0.86L 69%, FVC: 1.64L 100% predicted.  Assessment and plan: Allergic reactions  Environmental allergies Asthma GERD  Recurrent allergic reactions to foods and environmental allergens, including a recent reaction to a seasoning on salmon.   - Order blood work for food and environmental allergy testing. - Advise avoidance of specific seasoning and identified allergens. - Continue Zyrtec  10mg  daily and Singulair  10mg  daily at bedtime. - Continue Flonase  2 sprays each nostril daily for 1-2 weeks at a time before stopping once nasal congestion improves for maximum benefit - Use nasal Astelin  2 sprays each nostril twice  a day for nasal drainage control.   With using nasal sprays point tip of bottle toward eye on same side nostril and lean head slightly forward for best technique.   - Continue avoidance of mushroom, salmon rub seasoning and other foods that cause symptoms.   - Have access to self-injectable epinephrine  (Epipen ) 0.3mg  at all times - Follow emergency action plan in case of allergic reaction - Continue  Symbicort  inhaler at current dose as directed by your pulmonologist - Continue Protonix  40mg  daily for reflux control  Follow-up in 4-6 months or sooner if needed  I appreciate the opportunity to take part in Deserae 's care. Please do not hesitate to contact me with questions.  Sincerely,   Danita Brain, MD Allergy/Immunology [facility]

## 2024-01-23 NOTE — Patient Instructions (Addendum)
 Allergic reactions  Environmental allergies Asthma GERD  Recurrent allergic reactions to foods and environmental allergens, including a recent reaction to a seasoning on salmon.   - Order blood work for food and environmental allergy testing. - Advise avoidance of specific seasoning and identified allergens. - Continue Zyrtec  10mg  daily and Singulair  10mg  daily at bedtime. - Continue Flonase  2 sprays each nostril daily for 1-2 weeks at a time before stopping once nasal congestion improves for maximum benefit - Use nasal Astelin  2 sprays each nostril twice a day for nasal drainage control.   With using nasal sprays point tip of bottle toward eye on same side nostril and lean head slightly forward for best technique.   - Continue avoidance of mushroom, salmon rub seasoning and other foods that cause symptoms.   - Have access to self-injectable epinephrine  (Epipen ) 0.3mg  at all times - Follow emergency action plan in case of allergic reaction - Continue Symbicort  inhaler at current dose as directed by your pulmonologist - Continue Protonix  40mg  daily for reflux control  Follow-up in 4-6 months or sooner if needed

## 2024-01-28 LAB — ALLERGENS W/TOTAL IGE AREA 2
Alternaria Alternata IgE: <0.1 kU/L
Aspergillus Fumigatus IgE: <0.1 kU/L
Bermuda Grass IgE: <0.1 kU/L
Cat Dander IgE: 0.2 kU/L — AB
Cedar, Mountain IgE: 0.26 kU/L — AB
Cladosporium Herbarum IgE: <0.1 kU/L
Cockroach, German IgE: <0.1 kU/L
Common Silver Birch IgE: 14.1 kU/L — AB
Cottonwood IgE: 0.48 kU/L — AB
D Farinae IgE: 1.74 kU/L — AB
D Pteronyssinus IgE: 1.58 kU/L — AB
Dog Dander IgE: 12 kU/L — AB
Elm, American IgE: 0.39 kU/L — AB
Johnson Grass IgE: <0.1 kU/L
Maple/Box Elder IgE: 0.31 kU/L — AB
Mouse Urine IgE: 0.13 kU/L — AB
Oak, White IgE: 3.37 kU/L — AB
Pecan, Hickory IgE: 0.71 kU/L — AB
Penicillium Chrysogen IgE: <0.1 kU/L
Pigweed, Rough IgE: <0.1 kU/L
Ragweed, Short IgE: 6.04 kU/L — AB
Sheep Sorrel IgE Qn: <0.1 kU/L
Timothy Grass IgE: <0.1 kU/L
White Mulberry IgE: <0.1 kU/L

## 2024-01-28 LAB — ALLERGEN, MUSHROOM, RF212: Mushroom IgE: <0.1 kU/L

## 2024-01-28 LAB — ALPHA-GAL PANEL
Allergen Lamb IgE: <0.1 kU/L
Beef IgE: <0.1 kU/L
IgE (Immunoglobulin E), Serum: 103 [IU]/mL (ref 6–495)
O215-IgE Alpha-Gal: <0.1 kU/L
Pork IgE: <0.1 kU/L

## 2024-01-28 LAB — F332-IGE MINT: Mint IgE: <0.1 kU/L

## 2024-01-28 LAB — TRYPTASE: Tryptase: 5.8 ug/L (ref 2.2–13.2)

## 2024-01-28 LAB — ALLERGEN,GRN PEPPER,PAPRIKA,F218: Paprika IgE: <0.1 kU/L

## 2024-01-28 LAB — ALLERGEN, RICE, F9: Allergen Rice IgE: <0.1 kU/L

## 2024-02-01 ENCOUNTER — Ambulatory Visit: Payer: Self-pay | Admitting: Allergy

## 2024-02-05 ENCOUNTER — Ambulatory Visit: Attending: Family Medicine | Admitting: Physical Therapy

## 2024-02-05 ENCOUNTER — Encounter: Payer: Self-pay | Admitting: Physical Therapy

## 2024-02-05 DIAGNOSIS — M6281 Muscle weakness (generalized): Secondary | ICD-10-CM | POA: Insufficient documentation

## 2024-02-05 DIAGNOSIS — M5459 Other low back pain: Secondary | ICD-10-CM | POA: Diagnosis present

## 2024-02-05 DIAGNOSIS — R293 Abnormal posture: Secondary | ICD-10-CM | POA: Diagnosis present

## 2024-02-05 NOTE — Therapy (Signed)
 OUTPATIENT PHYSICAL THERAPY LOWER EXTREMITY TREATMENT   Patient Name: [name] MRN: [id number] DOB:05/14/1936, 88 y.o., female Today's Date: 02/05/2024  END OF SESSION:  PT End of Session - 02/05/24 1531     Visit Number 2    Date for PT Re-Evaluation 03/11/24    Authorization Type [employer]   [employer] APPROVED 16 VISITS 01/15/2024 - 03/11/2024 #[id number]    Authorization Time Period 01/15/2024 - 03/11/2024    Authorization - Visit Number 2    Authorization - Number of Visits 16    Progress Note Due on Visit 10    PT Start Time 1435    PT Stop Time 1523    PT Time Calculation (min) 48 min    Activity Tolerance Patient tolerated treatment well    Behavior During Therapy WFL for tasks assessed/performed           Past Medical History:  Diagnosis Date   Arthritis    Asthma    Blood clotting disorder (HCC)    Cataract    COPD (chronic obstructive pulmonary disease) (HCC)    Depression    Diverticulosis 10-2011   Colonoscopy   GERD (gastroesophageal reflux disease)    Hemorrhoids, internal    Hx of colonic polyp    Hyperlipemia    Hypertension    IBS (irritable bowel syndrome)    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   EGD   TIA (transient ischemic attack)    Past Surgical History:  Procedure Laterality Date   CESAREAN SECTION  1962   LAPAROSCOPIC CHOLECYSTECTOMY  1995   Patient Active Problem List   Diagnosis Date Noted   Asthmatic bronchitis , chronic (HCC) 12/26/2023   Age-related osteoporosis without current pathological fracture 12/13/2023   Anaphylactic syndrome 11/29/2023   Duffy-Null Associated Neutrophil Count (DNANC) 10/09/2023   Stenosing tenosynovitis 10/09/2023   CKD stage 3b, GFR 30-44 ml/min (HCC) 10/09/2023   Postmenopausal estrogen deficiency 10/09/2023   Keratoconjunctivitis sicca of both eyes not due to Sjogren's syndrome 09/11/2023   Bilateral sciatica 09/11/2023   Anemia 09/11/2023   Leucopenia 09/11/2023   Palpitations 04/28/2023    Myalgia 07/21/2019   Thrush 07/21/2019   Dyslipidemia 05/24/2018   Chest pain 05/24/2018   Abnormal EKG 05/24/2018   Pleurisy 09/13/2017   Neck pain 10/21/2014   Low back pain 10/21/2014   Essential hypertension 07/15/2014   HLD (hyperlipidemia) 03/24/2014   Headache on top of head 03/24/2014   TIA (transient ischemic attack) 03/13/2014   Stricture and stenosis of esophagus 12/13/2011   Family history of malignant neoplasm of gastrointestinal tract 11/01/2011   History of colonic polyps 11/01/2011   Allergic rhinitis 09/17/2008   Intrinsic asthma 09/17/2008   Laryngopharyngeal reflux disease 09/17/2008   CHOLECYSTECTOMY, HX OF 09/17/2008    PCP: Sebastian Beverley NOVAK, MD  REFERRING PROVIDER: Sebastian Beverley NOVAK, MD  REFERRING DIAG:  Diagnosis  M81.0 (ICD-10-CM) - Age-related osteoporosis without current pathological fracture    THERAPY DIAG:  Muscle weakness (generalized)  Other low back pain  Abnormal posture  Rationale for Evaluation and Treatment: Rehabilitation  ONSET DATE: 12/12/2023  SUBJECTIVE:   SUBJECTIVE STATEMENT: Patient reports she has not had a chance to take care of herself like she should because her son had emergency surgery.   From Eval: Patient presents with age related osteoporosis. The MD did not explain to her what is. She reports having degenerative joint disease in her back and she wears a back brace everyday. She wears a back brace when she  lifts things like pots. She sometimes has tinglinh that goes down her legs. Her back pain started when she got an epidural during child birth.  PERTINENT HISTORY: Osteoporosis; OA; asthma; COPD; depression; HTN; see DEXA results below PAIN:  Are you having pain? Yes: NPRS scale: 8(currently) 9(worst) /10 Pain location: lumbar spine into buttocks Pain description: achy; sharp Aggravating factors: bending; walking ; lifting Relieving factors: medication; forward trunk bend to touch her toes  PRECAUTIONS:  None  RED FLAGS: None   WEIGHT BEARING RESTRICTIONS: No  FALLS:  Has patient fallen in last 6 months? No  LIVING ENVIRONMENT: Lives with: lives with their son Lives in: House/apartment Stairs: Yes stairs but she does not use them. She stays on the first floor Has following equipment at home: None  OCCUPATION: Retired  PLOF: Independent, Independent with basic ADLs, Independent with household mobility without device, Independent with community mobility without device, Independent with gait, and Independent with transfers  PATIENT GOALS: To maintain independence  NEXT MD VISIT: October 2025  OBJECTIVE:  Note: Objective measures were completed at Evaluation unless otherwise noted.  DIAGNOSTIC FINDINGS: DEXA 6/25 LEFT FEMORAL NECK: T-score: -1.9  LEFT TOTAL HIP: T-score: -1.3   RIGHT FEMORAL NECK: T-score: -2.4  RIGHT TOTAL HIP: T-score: -1.6  LEFT FOREARM (RADIUS 33%): T-score: -2.5   PATIENT SURVEYS:  Modified Oswestry: 13/50 26% moderate disability  Minimally Clinically Important Difference (MCID) = 12.8%  COGNITION: Overall cognitive status: Within functional limits for tasks assessed     SENSATION: Patient has occasional tingling in her legs   MUSCLE LENGTH: Hamstrings:decreased bilateral   POSTURE: rounded shoulders and forward head   LOWER EXTREMITY ROM:WFL bilateral    LOWER EXTREMITY MMT:  MMT Right eval Left eval  Hip flexion 4- 4-  Hip extension    Hip abduction 4- 4-  Hip adduction 4- 4-  Hip internal rotation    Hip external rotation    Knee flexion 4 4  Knee extension 4 4  Ankle dorsiflexion    Ankle plantarflexion    Ankle inversion    Ankle eversion     (Blank rows = not tested)   FUNCTIONAL TESTS:  5 times sit to stand: 24.91 sec with UE support Timed up and go (TUG): 10.85 sec  GAIT:  Comments: decreased cadence; narrow BOS                                                                                                                                 TREATMENT DATE:  02/05/2024 NuStep Level 3 - 5 mins- PT present to discuss status Seated hamstring stretch 2 x 20 sec bilateral  Seated figure four 2 x 20 sec bilateral  Sit to Stand x 10 Side stepping with yellow loop x 3 laps at counter Heel Raises 2 x 10 Seated ball press 2 x 10 Seated TA activation + ball squeeze 2 x 10 Seated modified deadlift 4# x 10 Standing  row & extension with red TB 2 x 10 6 inch step ups x 10 bilateral (forwards & lateral) Standing shoulder press 2# 2 x 10 Standing biceps curl 2# 2 x 10   01/15/2024 Initial Evaluation & HEP created  Reviewed Osteoporosis handout. Provided patient with a copy and added it to patient instructions. Spoke about patient's back pain and how we can address with with therapy  PATIENT EDUCATION:  Education details: Provided patient education and discussion on quality of life and stress management. Discussed motivators, challenges, and goals for future.  Person educated: Patient Education method: Explanation, Demonstration, and Handouts Education comprehension: verbalized understanding, returned demonstration, and needs further education  HOME EXERCISE PROGRAM: Access Code: [email or web address] URL: [email or web address] Date: 02/05/2024 Prepared by: Kristeen Sar  Exercises - Seated Hamstring Stretch  - 1-2 x daily - 7 x weekly - 2 sets - 20-30s hold - Seated Figure 4 Piriformis Stretch  - 1-2 x daily - 7 x weekly - 2 sets - 20-30 hold - Sit to Stand with Armchair  - 1-2 x daily - 7 x weekly - 1-2 sets - 10 reps - Side Stepping with Resistance at Thighs  - 1-2 x daily - 7 x weekly - 3 sets - 10 reps - Heel Raises with Counter Support  - 1-2 x daily - 7 x weekly - 1-2 sets - 10 reps  ASSESSMENT:  CLINICAL IMPRESSION: Abbegayle  presents to first follow up appointment since evaluation. She verbalized poor compliance to HEP due to her son having emergency surgery. Reviewed HEP and provided  verbal and visual cues for correct exercise performance. Sit stepping with resistance band was a good challenge for patient. Reviewed osteoporosis handout and answered patient's questions regarding how physical therapy can help her T-score numbers. Overall, patient should respond well to skilled therapy. Patient will benefit from skilled PT to address the below impairments and improve overall function.    OBJECTIVE IMPAIRMENTS: Abnormal gait, decreased balance, decreased mobility, decreased strength, hypomobility, increased muscle spasms, impaired flexibility, improper body mechanics, postural dysfunction, and pain.   ACTIVITY LIMITATIONS: lifting, bending, standing, squatting, sleeping, stairs, transfers, and locomotion level  PARTICIPATION LIMITATIONS: meal prep, cleaning, laundry, driving, shopping, and community activity  PERSONAL FACTORS: Age, Fitness, Time since onset of injury/illness/exacerbation, and 3+ comorbidities: Osteoporosis; OA; asthma; COPD; depression; HTN;  are also affecting patient's functional outcome.   REHAB POTENTIAL: Good  CLINICAL DECISION MAKING: Evolving/moderate complexity  EVALUATION COMPLEXITY: Moderate   GOALS: Goals reviewed with patient? Yes  SHORT TERM GOALS: Target date: 02/12/2024  Patient will be independent with initial HEP. Baseline:  Goal status: INITIAL  2.  Patient will demonstrate improved upright posture and spinal alignment during standing and sitting task to reduce mechanical strain.  Baseline:  Goal status: INITIAL  3.   Patient will be able to participate in 3/6 minute walk test to establish a baseline measurement. Baseline:  Goal status: INITIAL  4.  Patient will perform 5STS in < or = to 20 sec due to improved LE strength and functional mobility to maintain independence. Baseline: 24.91 sec Goal status: INITIAL   LONG TERM GOALS: Target date: 03/11/2024  Patient will demonstrate independence in advanced HEP. Baseline:   Goal status: INITIAL  2.  Patient will report > or = to 50% improvement in back pain since starting PT. Baseline:  Goal status: INITIAL  3.  Patient demonstrate correct lifting technique to prevent spontaneous fractures and mechanical strain. Baseline:  Goal status: INITIAL   4.  Patient will  score < or = to 26/50 on ODI due to decreased self perceived disability. Baseline: 13/50 26% Goal status: INITIAL  5.  Patient will perform 5STS in < or = 16 sec for improved functional mobility and independence. Baseline: 24.91 sec Goal status: INITIAL  6.  Patient will demonstrate at least 4+/5 hip strength to improve performance of bending and lifting. Baseline:  Goal status: INITIAL   PLAN:  PT FREQUENCY: 1-2x/week  PT DURATION: 8 weeks  PLANNED INTERVENTIONS: [id number]- PT Re-evaluation, 97110-Therapeutic exercises, 97530- Therapeutic activity, 97112- Neuromuscular re-education, 97535- Self Care, [id number]- Manual therapy, [phone]- Gait training, [phone]- Canalith repositioning, [id number]- Aquatic Therapy, [phone]- Electrical stimulation (unattended), [phone]- Electrical stimulation (manual), [id number] (1-2 muscles), [id number] (3+ muscles)- Dry Needling, Patient/Family education, Balance training, Stair training, Taping, Joint mobilization, Joint manipulation, Vestibular training, Cryotherapy, and Moist heat  PLAN FOR NEXT SESSION: assess tolerance to treatment session & HEP compliance;  weight bearing activities; see if patient has questions about osteoporosis handout; posture when standing, sitting and lifting   Kristeen Sar, PT 02/05/24 3:32 PM [facility] [address], [address] [place], [place] [address] Phone # [phone] Fax [phone]

## 2024-02-08 ENCOUNTER — Ambulatory Visit: Admitting: Physical Therapy

## 2024-02-08 DIAGNOSIS — M6281 Muscle weakness (generalized): Secondary | ICD-10-CM

## 2024-02-08 DIAGNOSIS — R293 Abnormal posture: Secondary | ICD-10-CM

## 2024-02-08 DIAGNOSIS — M5459 Other low back pain: Secondary | ICD-10-CM

## 2024-02-08 NOTE — Patient Instructions (Signed)
 OSTEOPOROSIS   Significant research has been done over the last few years that have made great strides in osteoporosis recommendations.  A few key concepts about bone loading: 1) be specific and target those areas of lowest T scores like the femoral neck or lumbar spine 2) be progressive and patient. It takes 6-8 months to make a change in bones 3)  the areas that have the worst T score benefit the most and the biggest improvements are seen! 4)  Even if you stop exercise, there will be some decline although some benefits will maintain  POWER LIFTING: One study called the LIFTMOR study really expands on the benefit of  lifting heavier loads:  dead lifting, carries   PROGRESSIVE RESISTANCE TRAINING: can be your own body weight against gravity, elastic bands or weights for resistance.  A basic muscle strengthening program can include:   Squat, lunge, hinge or bridge exercises to improve leg strength Push, pull and press exercises for upper body and shoulder muscles such as pull downs, rows and counter or floor push ups Planks, side planks and bird dog exercises to target abdominal and back extensor muscles and improve posture   IMPACT EXERCISE:  a study in 2019 measured ground reactive forces relative to bodyweight to find which exercises stimulated the bone the most.  Some of the most surprisingly high beneficial forces are: Heel drops Hopping Foot Stomping Side to side jump over rope   BALANCE:  balance exercises involve staying steady during movements that make you unstable. You should practice: Leaning forward, backward or side to side Unusual walking or dance patterns, such as walking heel to toe or sideways or using an agility ladder Reacting to things that upset your balance, like stopping or changing directions Tai Chi  Practice exercises that improve functional abilities such as: Sit to  stands or squats Stair climbing or toe taps on a step   Aim for 2-3 times per week and increase difficulty over time.  ACTIVITY                                                              GRF'S RELATIVE TO BODYWEIGHT  Lunge                                                                                               1.1  Walking                                                                                            1.2  Side Lunge  1.2  Marching on the spot                                                                        1.5  Stride jump                                                                                       2.1  Lateral step-ups (15 cm)                                                                   2.1  Forward step-ups (15 cm)      2.2  Running        2.6  Dance Step        2.7  Step up (30 cm)       2.7  Lateral step ups (30 cm)      3.1  Single leg forward leap      3.1  Hopping on single leg       3.4  Jump take off        3.5  Heel drop        3.6  Jump squat        3.8  Side to side jumps       3.9  Star jumps        4.3  Foot stomp        4.6  Vertical jump        4.7  Tuck jump        4.8  Side to side jump over rope      5.1  Depth jump (30 cm)       5.2  Forward/backward squat jump     6.3  Vertical squat jump       7.1  (Daily et al, 2019)

## 2024-02-08 NOTE — Therapy (Signed)
 OUTPATIENT PHYSICAL THERAPY LOWER EXTREMITY TREATMENT   Patient Name: [name] MRN: [id number] DOB:1935-11-26, 88 y.o., female Today's Date: 02/08/2024  END OF SESSION:  PT End of Session - 02/08/24 1104     Visit Number 3    Date for PT Re-Evaluation 03/11/24    Authorization Type [employer]   [employer] APPROVED 16 VISITS 01/15/2024 - 03/11/2024 #[id number]    Authorization Time Period 01/15/2024 - 03/11/2024    Authorization - Visit Number 3    Authorization - Number of Visits 16    Progress Note Due on Visit 10    PT Start Time 1101    PT Stop Time 1145    PT Time Calculation (min) 44 min    Activity Tolerance Patient tolerated treatment well           Past Medical History:  Diagnosis Date   Arthritis    Asthma    Blood clotting disorder (HCC)    Cataract    COPD (chronic obstructive pulmonary disease) (HCC)    Depression    Diverticulosis 10-2011   Colonoscopy   GERD (gastroesophageal reflux disease)    Hemorrhoids, internal    Hx of colonic polyp    Hyperlipemia    Hypertension    IBS (irritable bowel syndrome)    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   EGD   TIA (transient ischemic attack)    Past Surgical History:  Procedure Laterality Date   CESAREAN SECTION  1962   LAPAROSCOPIC CHOLECYSTECTOMY  1995   Patient Active Problem List   Diagnosis Date Noted   Asthmatic bronchitis , chronic (HCC) 12/26/2023   Age-related osteoporosis without current pathological fracture 12/13/2023   Anaphylactic syndrome 11/29/2023   Duffy-Null Associated Neutrophil Count (DNANC) 10/09/2023   Stenosing tenosynovitis 10/09/2023   CKD stage 3b, GFR 30-44 ml/min (HCC) 10/09/2023   Postmenopausal estrogen deficiency 10/09/2023   Keratoconjunctivitis sicca of both eyes not due to Sjogren's syndrome 09/11/2023   Bilateral sciatica 09/11/2023   Anemia 09/11/2023   Leucopenia 09/11/2023   Palpitations 04/28/2023   Myalgia 07/21/2019   Thrush 07/21/2019   Dyslipidemia  05/24/2018   Chest pain 05/24/2018   Abnormal EKG 05/24/2018   Pleurisy 09/13/2017   Neck pain 10/21/2014   Low back pain 10/21/2014   Essential hypertension 07/15/2014   HLD (hyperlipidemia) 03/24/2014   Headache on top of head 03/24/2014   TIA (transient ischemic attack) 03/13/2014   Stricture and stenosis of esophagus 12/13/2011   Family history of malignant neoplasm of gastrointestinal tract 11/01/2011   History of colonic polyps 11/01/2011   Allergic rhinitis 09/17/2008   Intrinsic asthma 09/17/2008   Laryngopharyngeal reflux disease 09/17/2008   CHOLECYSTECTOMY, HX OF 09/17/2008    PCP: Sebastian Beverley NOVAK, MD  REFERRING PROVIDER: Sebastian Beverley NOVAK, MD  REFERRING DIAG:  Diagnosis  M81.0 (ICD-10-CM) - Age-related osteoporosis without current pathological fracture    THERAPY DIAG:  Muscle weakness (generalized)  Other low back pain  Abnormal posture  Rationale for Evaluation and Treatment: Rehabilitation  ONSET DATE: 12/12/2023  SUBJECTIVE:   SUBJECTIVE STATEMENT: Patient reports she was sore 2 days after last session.  Then I fell out of bed reaching for my phone.  Reports that if she is on her feet too long it will hurt her back.   From Eval: Patient presents with age related osteoporosis. The MD did not explain to her what is. She reports having degenerative joint disease in her back and she wears a back brace everyday. She wears  a back brace when she lifts things like pots. She sometimes has tinglinh that goes down her legs. Her back pain started when she got an epidural during child birth.  PERTINENT HISTORY: Osteoporosis; OA; asthma; COPD; depression; HTN; see DEXA results below PAIN:  Are you having pain? Yes: NPRS scale: 0/10 Pain location: lumbar spine into buttocks Pain description: achy; sharp Aggravating factors: bending; walking ; lifting Relieving factors: medication; forward trunk bend to touch her toes  PRECAUTIONS: None  RED  FLAGS: None   WEIGHT BEARING RESTRICTIONS: No  FALLS:  Has patient fallen in last 6 months? No  LIVING ENVIRONMENT: Lives with: lives with their son Lives in: House/apartment Stairs: Yes stairs but she does not use them. She stays on the first floor Has following equipment at home: None  OCCUPATION: Retired  PLOF: Independent, Independent with basic ADLs, Independent with household mobility without device, Independent with community mobility without device, Independent with gait, and Independent with transfers  PATIENT GOALS: To maintain independence  NEXT MD VISIT: October 2025  OBJECTIVE:  Note: Objective measures were completed at Evaluation unless otherwise noted.  DIAGNOSTIC FINDINGS: DEXA 6/25 LEFT FEMORAL NECK: T-score: -1.9  LEFT TOTAL HIP: T-score: -1.3   RIGHT FEMORAL NECK: T-score: -2.4  RIGHT TOTAL HIP: T-score: -1.6  LEFT FOREARM (RADIUS 33%): T-score: -2.5   PATIENT SURVEYS:  Modified Oswestry: 13/50 26% moderate disability  Minimally Clinically Important Difference (MCID) = 12.8%  COGNITION: Overall cognitive status: Within functional limits for tasks assessed     SENSATION: Patient has occasional tingling in her legs   MUSCLE LENGTH: Hamstrings:decreased bilateral   POSTURE: rounded shoulders and forward head   LOWER EXTREMITY ROM:WFL bilateral    LOWER EXTREMITY MMT:  MMT Right eval Left eval  Hip flexion 4- 4-  Hip extension    Hip abduction 4- 4-  Hip adduction 4- 4-  Hip internal rotation    Hip external rotation    Knee flexion 4 4  Knee extension 4 4  Ankle dorsiflexion    Ankle plantarflexion    Ankle inversion    Ankle eversion     (Blank rows = not tested)   FUNCTIONAL TESTS:  5 times sit to stand: 24.91 sec with UE support Timed up and go (TUG): 10.85 sec  GAIT:  Comments: decreased cadence; narrow BOS                                                                                                                                 TREATMENT DATE:  02/05/2024 NuStep Level 4 - 8 mins- PT present to discuss status  Osteoporosis education with handouts per patient instructions on types of ex's beneficial for bone health  Heel drops 10x Foot stomps 10X Seated red band row 2x 10 Sit to stand 10x  Wall push ups 10x (Added to HEP- see below) Dead lift 5# kettlebell 2 sets of 5 (verbal cues for forward gaze) (Added  to HEP- see below)    01/15/2024 Initial Evaluation & HEP created  Reviewed Osteoporosis handout. Provided patient with a copy and added it to patient instructions. Spoke about patient's back pain and how we can address with with therapy  PATIENT EDUCATION:  Education details: Provided patient education and discussion on quality of life and stress management. Discussed motivators, challenges, and goals for future.  Person educated: Patient Education method: Explanation, Demonstration, and Handouts Education comprehension: verbalized understanding, returned demonstration, and needs further education  HOME EXERCISE PROGRAM: Access Code: [email or web address] URL: [email or web address] Date: 02/08/2024 Prepared by: Glade Pesa  Exercises - Seated Hamstring Stretch  - 1-2 x daily - 7 x weekly - 2 sets - 20-30s hold - Seated Figure 4 Piriformis Stretch  - 1-2 x daily - 7 x weekly - 2 sets - 20-30 hold - Sit to Stand with Armchair  - 1-2 x daily - 7 x weekly - 1-2 sets - 10 reps - Side Stepping with Resistance at Thighs  - 1-2 x daily - 7 x weekly - 3 sets - 10 reps - Heel Raises with Counter Support  - 1-2 x daily - 7 x weekly - 1-2 sets - 10 reps - Wall Push Up  - 1 x daily - 7 x weekly - 3 sets - 10 reps - Half Deadlift with Kettlebell  - 1 x daily - 7 x weekly - 2 sets - 5 reps   ASSESSMENT:  CLINICAL IMPRESSION: Therapist educating patient on the best type of ex's for bone mineral density building.  She does well with alternating sitting and standing ex's to avoid  aggravating her chronic back pain with standing too long.  Therapist providing verbal cues to optimize technique with  exercises in order to achieve the greatest benefit.  Therapist closely monitoring response with pt reporting end of session, I feel great!   OBJECTIVE IMPAIRMENTS: Abnormal gait, decreased balance, decreased mobility, decreased strength, hypomobility, increased muscle spasms, impaired flexibility, improper body mechanics, postural dysfunction, and pain.   ACTIVITY LIMITATIONS: lifting, bending, standing, squatting, sleeping, stairs, transfers, and locomotion level  PARTICIPATION LIMITATIONS: meal prep, cleaning, laundry, driving, shopping, and community activity  PERSONAL FACTORS: Age, Fitness, Time since onset of injury/illness/exacerbation, and 3+ comorbidities: Osteoporosis; OA; asthma; COPD; depression; HTN;  are also affecting patient's functional outcome.   REHAB POTENTIAL: Good  CLINICAL DECISION MAKING: Evolving/moderate complexity  EVALUATION COMPLEXITY: Moderate   GOALS: Goals reviewed with patient? Yes  SHORT TERM GOALS: Target date: 02/12/2024  Patient will be independent with initial HEP. Baseline:  Goal status: INITIAL  2.  Patient will demonstrate improved upright posture and spinal alignment during standing and sitting task to reduce mechanical strain.  Baseline:  Goal status: INITIAL  3.   Patient will be able to participate in 3/6 minute walk test to establish a baseline measurement. Baseline:  Goal status: INITIAL  4.  Patient will perform 5STS in < or = to 20 sec due to improved LE strength and functional mobility to maintain independence. Baseline: 24.91 sec Goal status: INITIAL   LONG TERM GOALS: Target date: 03/11/2024  Patient will demonstrate independence in advanced HEP. Baseline:  Goal status: INITIAL  2.  Patient will report > or = to 50% improvement in back pain since starting PT. Baseline:  Goal status: INITIAL  3.   Patient demonstrate correct lifting technique to prevent spontaneous fractures and mechanical strain. Baseline:  Goal status: INITIAL   4.  Patient will score < or = to 26/50  on ODI due to decreased self perceived disability. Baseline: 13/50 26% Goal status: INITIAL  5.  Patient will perform 5STS in < or = 16 sec for improved functional mobility and independence. Baseline: 24.91 sec Goal status: INITIAL  6.  Patient will demonstrate at least 4+/5 hip strength to improve performance of bending and lifting. Baseline:  Goal status: INITIAL   PLAN:  PT FREQUENCY: 1-2x/week  PT DURATION: 8 weeks  PLANNED INTERVENTIONS: [id number]- PT Re-evaluation, 97110-Therapeutic exercises, 97530- Therapeutic activity, 97112- Neuromuscular re-education, 97535- Self Care, [id number]- Manual therapy, [phone]- Gait training, [phone]- Canalith repositioning, [id number]- Aquatic Therapy, [phone]- Electrical stimulation (unattended), [phone]- Electrical stimulation (manual), 20560 (1-2 muscles), 20561 (3+ muscles)- Dry Needling, Patient/Family education, Balance training, Stair training, Taping, Joint mobilization, Joint manipulation, Vestibular training, Cryotherapy, and Moist heat  PLAN FOR NEXT SESSION: assess tolerance to treatment session & HEP compliance;  weight bearing activities; see if patient has questions about osteoporosis handout; posture when standing, sitting and lifting   Glade Pesa, PT 02/08/24 5:27 PM Phone: [phone] Fax: [phone]

## 2024-02-12 ENCOUNTER — Encounter: Payer: Self-pay | Admitting: Rehabilitative and Restorative Service Providers"

## 2024-02-12 ENCOUNTER — Ambulatory Visit: Admitting: Rehabilitative and Restorative Service Providers"

## 2024-02-12 DIAGNOSIS — M6281 Muscle weakness (generalized): Secondary | ICD-10-CM | POA: Diagnosis not present

## 2024-02-12 DIAGNOSIS — M5459 Other low back pain: Secondary | ICD-10-CM

## 2024-02-12 DIAGNOSIS — R293 Abnormal posture: Secondary | ICD-10-CM

## 2024-02-12 NOTE — Therapy (Signed)
 OUTPATIENT PHYSICAL THERAPY TREATMENT NOTE   Patient Name: [name] MRN: [id number] DOB:[birth year], 88 y.o., female Today's Date: 02/12/2024  END OF SESSION:  PT End of Session - 02/12/24 1548     Visit Number 4    Date for PT Re-Evaluation 03/11/24    Authorization Type [employer] Medicare   [employer] APPROVED 16 VISITS 01/15/2024 - 03/11/2024 #[id number]    Authorization Time Period 01/15/2024 - 03/11/2024    Authorization - Visit Number 4    Authorization - Number of Visits 16    Progress Note Due on Visit 10    PT Start Time 0245    PT Stop Time 0330    PT Time Calculation (min) 45 min    Activity Tolerance Patient tolerated treatment well    Behavior During Therapy WFL for tasks assessed/performed            Past Medical History:  Diagnosis Date   Arthritis    Asthma    Blood clotting disorder (HCC)    Cataract    COPD (chronic obstructive pulmonary disease) (HCC)    Depression    Diverticulosis 10-2011   Colonoscopy   GERD (gastroesophageal reflux disease)    Hemorrhoids, internal    Hx of colonic polyp    Hyperlipemia    Hypertension    IBS (irritable bowel syndrome)    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   EGD   TIA (transient ischemic attack)    Past Surgical History:  Procedure Laterality Date   CESAREAN SECTION  1962   LAPAROSCOPIC CHOLECYSTECTOMY  1995   Patient Active Problem List   Diagnosis Date Noted   Asthmatic bronchitis , chronic (HCC) 12/26/2023   Age-related osteoporosis without current pathological fracture 12/13/2023   Anaphylactic syndrome 11/29/2023   Duffy-Null Associated Neutrophil Count (DNANC) 10/09/2023   Stenosing tenosynovitis 10/09/2023   CKD stage 3b, GFR 30-44 ml/min (HCC) 10/09/2023   Postmenopausal estrogen deficiency 10/09/2023   Keratoconjunctivitis sicca of both eyes not due to Sjogren's syndrome 09/11/2023   Bilateral sciatica 09/11/2023   Anemia 09/11/2023   Leucopenia 09/11/2023   Palpitations 04/28/2023   Myalgia  07/21/2019   Thrush 07/21/2019   Dyslipidemia 05/24/2018   Chest pain 05/24/2018   Abnormal EKG 05/24/2018   Pleurisy 09/13/2017   Neck pain 10/21/2014   Low back pain 10/21/2014   Essential hypertension 07/15/2014   HLD (hyperlipidemia) 03/24/2014   Headache on top of head 03/24/2014   TIA (transient ischemic attack) 03/13/2014   Stricture and stenosis of esophagus 12/13/2011   Family history of malignant neoplasm of gastrointestinal tract 11/01/2011   History of colonic polyps 11/01/2011   Allergic rhinitis 09/17/2008   Intrinsic asthma 09/17/2008   Laryngopharyngeal reflux disease 09/17/2008   CHOLECYSTECTOMY, HX OF 09/17/2008    PCP: Sebastian Beverley NOVAK, MD  REFERRING PROVIDER: Sebastian Beverley NOVAK, MD  REFERRING DIAG:  Diagnosis  M81.0 (ICD-10-CM) - Age-related osteoporosis without current pathological fracture    THERAPY DIAG:  Other low back pain  Muscle weakness (generalized)  Abnormal posture  Rationale for Evaluation and Treatment: Rehabilitation  ONSET DATE: 12/12/2023  SUBJECTIVE:   SUBJECTIVE STATEMENT: Pt reports no pain today, however had 5/10 pain in bilateral hips this morning that went away after 30 in of moving around.   From Eval: Patient presents with age related osteoporosis. The MD did not explain to her what is. She reports having degenerative joint disease in her back and she wears a back brace everyday. She wears a back brace when  she lifts things like pots. She sometimes has tinglinh that goes down her legs. Her back pain started when she got an epidural during child birth.  PERTINENT HISTORY: Osteoporosis; OA; asthma; COPD; depression; HTN; see DEXA results below  PAIN:  Are you having pain? Yes: NPRS scale: 0/10 Pain location: lumbar spine into buttocks Pain description: achy; sharp Aggravating factors: bending; walking ; lifting Relieving factors: medication; forward trunk bend to touch her toes  PRECAUTIONS: None  RED  FLAGS: None   WEIGHT BEARING RESTRICTIONS: No  FALLS:  Has patient fallen in last 6 months? No  LIVING ENVIRONMENT: Lives with: lives with their son Lives in: House/apartment Stairs: Yes stairs but she does not use them. She stays on the first floor Has following equipment at home: None  OCCUPATION: Retired  PLOF: Independent, Independent with basic ADLs, Independent with household mobility without device, Independent with community mobility without device, Independent with gait, and Independent with transfers  PATIENT GOALS: To maintain independence  NEXT MD VISIT: October 2025  OBJECTIVE:  Note: Objective measures were completed at Evaluation unless otherwise noted.  DIAGNOSTIC FINDINGS: DEXA 6/25 LEFT FEMORAL NECK: T-score: -1.9  LEFT TOTAL HIP: T-score: -1.3   RIGHT FEMORAL NECK: T-score: -2.4  RIGHT TOTAL HIP: T-score: -1.6  LEFT FOREARM (RADIUS 33%): T-score: -2.5   PATIENT SURVEYS:  Modified Oswestry: 13/50 26% moderate disability  Minimally Clinically Important Difference (MCID) = 12.8%  COGNITION: Overall cognitive status: Within functional limits for tasks assessed     SENSATION: Patient has occasional tingling in her legs   MUSCLE LENGTH: Hamstrings:decreased bilateral   POSTURE: rounded shoulders and forward head   LOWER EXTREMITY ROM:WFL bilateral    LOWER EXTREMITY MMT:  MMT Right eval Left eval  Hip flexion 4- 4-  Hip extension    Hip abduction 4- 4-  Hip adduction 4- 4-  Hip internal rotation    Hip external rotation    Knee flexion 4 4  Knee extension 4 4  Ankle dorsiflexion    Ankle plantarflexion    Ankle inversion    Ankle eversion     (Blank rows = not tested)   FUNCTIONAL TESTS:  Eval: 5 times sit to stand: 24.91 sec with UE support Timed up and go (TUG): 10.85 sec  GAIT:  Comments: decreased cadence; narrow BOS                                                                                                                                 TREATMENT DATE:   02/12/24 NuStep Level 3 - 8 mins- PT student present to discuss pt status  Seated hamstring stretch 2x30 sec Seated piriformis stretch 2x30 sec LAQ in seated 2x8 Seated ball press on lap for TA activation 3x8 Seated heel raises 2x10 Seated Adductor squeezes 2x10 Sit to stand x10  6 inch step ups up with right, down with right x10/up with left, down with left x10  02/05/2024  NuStep Level 4 - 8 mins- PT present to discuss status  Osteoporosis education with handouts per patient instructions on types of ex's beneficial for bone health  Heel drops 10x Foot stomps 10X Seated red band row 2x 10 Sit to stand 10x  Wall push ups 10x (Added to HEP- see below) Dead lift 5# kettlebell 2 sets of 5 (verbal cues for forward gaze) (Added to HEP- see below)    01/15/2024 Initial Evaluation & HEP created  Reviewed Osteoporosis handout. Provided patient with a copy and added it to patient instructions. Spoke about patient's back pain and how we can address with with therapy  PATIENT EDUCATION:  Education details: Provided patient education and discussion on quality of life and stress management. Discussed motivators, challenges, and goals for future.  Person educated: Patient Education method: Explanation, Demonstration, and Handouts Education comprehension: verbalized understanding, returned demonstration, and needs further education  HOME EXERCISE PROGRAM: Access Code: [email or web address] URL: [email or web address] Date: 02/08/2024 Prepared by: Glade Pesa  Exercises - Seated Hamstring Stretch  - 1-2 x daily - 7 x weekly - 2 sets - 20-30s hold - Seated Figure 4 Piriformis Stretch  - 1-2 x daily - 7 x weekly - 2 sets - 20-30 hold - Sit to Stand with Armchair  - 1-2 x daily - 7 x weekly - 1-2 sets - 10 reps - Side Stepping with Resistance at Thighs  - 1-2 x daily - 7 x weekly - 3 sets - 10 reps - Heel Raises with Counter Support  - 1-2  x daily - 7 x weekly - 1-2 sets - 10 reps - Wall Push Up  - 1 x daily - 7 x weekly - 3 sets - 10 reps - Half Deadlift with Kettlebell  - 1 x daily - 7 x weekly - 2 sets - 5 reps   ASSESSMENT:  CLINICAL IMPRESSION:   Ms Gillean is determined to learn new exercises and to do them correctly. She trialed a series of seated exercises to promote sit to stands for a progression of functional training. She had difficulty adjusting to neuromuscular reeducation with exhaling during abdominal contraction while pressing ball in lap, however improved her coordination by the end. Plan to practice next visit. She also required redirection and verbal cues during 6 in step ups with coming down with correct foot. Pt will benefit from continuing skilled PT to address the impairments below and move closer to her goal related activities.    OBJECTIVE IMPAIRMENTS: Abnormal gait, decreased balance, decreased mobility, decreased strength, hypomobility, increased muscle spasms, impaired flexibility, improper body mechanics, postural dysfunction, and pain.   ACTIVITY LIMITATIONS: lifting, bending, standing, squatting, sleeping, stairs, transfers, and locomotion level  PARTICIPATION LIMITATIONS: meal prep, cleaning, laundry, driving, shopping, and community activity  PERSONAL FACTORS: Age, Fitness, Time since onset of injury/illness/exacerbation, and 3+ comorbidities: Osteoporosis; OA; asthma; COPD; depression; HTN;  are also affecting patient's functional outcome.   REHAB POTENTIAL: Good  CLINICAL DECISION MAKING: Evolving/moderate complexity  EVALUATION COMPLEXITY: Moderate   GOALS: Goals reviewed with patient? Yes  SHORT TERM GOALS: Target date: 02/12/2024  Patient will be independent with initial HEP. Baseline:  Goal status: Ongoing  2.  Patient will demonstrate improved upright posture and spinal alignment during standing and sitting task to reduce mechanical strain.  Baseline:  Goal status:  Progressing   3.   Patient will be able to participate in 3/6 minute walk test to establish a baseline measurement. Baseline:  Goal status: INITIAL  4.  Patient  will perform 5STS in < or = to 20 sec due to improved LE strength and functional mobility to maintain independence. Baseline: 24.91 sec Goal status: Progressing    LONG TERM GOALS: Target date: 03/11/2024  Patient will demonstrate independence in advanced HEP. Baseline:  Goal status: INITIAL  2.  Patient will report > or = to 50% improvement in back pain since starting PT. Baseline:  Goal status: INITIAL  3.  Patient demonstrate correct lifting technique to prevent spontaneous fractures and mechanical strain. Baseline:  Goal status: INITIAL   4.  Patient will score < or = to 26/50 on ODI due to decreased self perceived disability. Baseline: 13/50 26% Goal status: INITIAL  5.  Patient will perform 5STS in < or = 16 sec for improved functional mobility and independence. Baseline: 24.91 sec Goal status: INITIAL  6.  Patient will demonstrate at least 4+/5 hip strength to improve performance of bending and lifting. Baseline:  Goal status: INITIAL   PLAN:  PT FREQUENCY: 1-2x/week  PT DURATION: 8 weeks  PLANNED INTERVENTIONS: 97164- PT Re-evaluation, 97110-Therapeutic exercises, 97530- Therapeutic activity, [id number]- Neuromuscular re-education, 97535- Self Care, 02859- Manual therapy, [phone]- Gait training, [phone]- Canalith repositioning, [id number]- Aquatic Therapy, [phone]- Electrical stimulation (unattended), [phone]- Electrical stimulation (manual), 20560 (1-2 muscles), [id number] (3+ muscles)- Dry Needling, Patient/Family education, Balance training, Stair training, Taping, Joint mobilization, Joint manipulation, Vestibular training, Cryotherapy, and Moist heat  PLAN FOR NEXT SESSION: assess tolerance to treatment session & HEP compliance;  weight bearing activities; see if patient has questions about osteoporosis handout; posture  when standing, core stability.  3/6 minute walk test   Lavanda Cleverly, SPT 02/12/24 3:49 PM  I agree with the following treatment note after reviewing documentation. This session was performed under the supervision of a licensed clinician. Jarrell Laming, PT, DPT 02/12/24, 3:52 PM  [facility] [address], [address] [place], [place] [address] Phone # [phone] Fax [phone]

## 2024-02-15 ENCOUNTER — Ambulatory Visit: Admitting: Physical Therapy

## 2024-02-15 ENCOUNTER — Encounter: Payer: Self-pay | Admitting: Physical Therapy

## 2024-02-15 DIAGNOSIS — M6281 Muscle weakness (generalized): Secondary | ICD-10-CM

## 2024-02-15 DIAGNOSIS — R293 Abnormal posture: Secondary | ICD-10-CM

## 2024-02-15 DIAGNOSIS — M5459 Other low back pain: Secondary | ICD-10-CM

## 2024-02-15 NOTE — Therapy (Signed)
 OUTPATIENT PHYSICAL THERAPY TREATMENT NOTE   Patient Name: [name] MRN: [id number] DOB:05-May-1936, 88 y.o., female Today's Date: 02/15/2024  END OF SESSION:  PT End of Session - 02/15/24 1111     Visit Number 5    Date for PT Re-Evaluation 03/11/24    Authorization Type [employer] Medicare   [employer] APPROVED 16 VISITS 01/15/2024 - 03/11/2024 #[id number]    Authorization Time Period 01/15/2024 - 03/11/2024    Authorization - Visit Number 5    Authorization - Number of Visits 16    Progress Note Due on Visit 10    PT Start Time 1022    PT Stop Time 1100    PT Time Calculation (min) 38 min    Activity Tolerance Patient tolerated treatment well    Behavior During Therapy WFL for tasks assessed/performed             Past Medical History:  Diagnosis Date   Arthritis    Asthma    Blood clotting disorder (HCC)    Cataract    COPD (chronic obstructive pulmonary disease) (HCC)    Depression    Diverticulosis 10-2011   Colonoscopy   GERD (gastroesophageal reflux disease)    Hemorrhoids, internal    Hx of colonic polyp    Hyperlipemia    Hypertension    IBS (irritable bowel syndrome)    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   EGD   TIA (transient ischemic attack)    Past Surgical History:  Procedure Laterality Date   CESAREAN SECTION  1962   LAPAROSCOPIC CHOLECYSTECTOMY  1995   Patient Active Problem List   Diagnosis Date Noted   Asthmatic bronchitis , chronic (HCC) 12/26/2023   Age-related osteoporosis without current pathological fracture 12/13/2023   Anaphylactic syndrome 11/29/2023   Duffy-Null Associated Neutrophil Count (DNANC) 10/09/2023   Stenosing tenosynovitis 10/09/2023   CKD stage 3b, GFR 30-44 ml/min (HCC) 10/09/2023   Postmenopausal estrogen deficiency 10/09/2023   Keratoconjunctivitis sicca of both eyes not due to Sjogren's syndrome 09/11/2023   Bilateral sciatica 09/11/2023   Anemia 09/11/2023   Leucopenia 09/11/2023   Palpitations 04/28/2023   Myalgia  07/21/2019   Thrush 07/21/2019   Dyslipidemia 05/24/2018   Chest pain 05/24/2018   Abnormal EKG 05/24/2018   Pleurisy 09/13/2017   Neck pain 10/21/2014   Low back pain 10/21/2014   Essential hypertension 07/15/2014   HLD (hyperlipidemia) 03/24/2014   Headache on top of head 03/24/2014   TIA (transient ischemic attack) 03/13/2014   Stricture and stenosis of esophagus 12/13/2011   Family history of malignant neoplasm of gastrointestinal tract 11/01/2011   History of colonic polyps 11/01/2011   Allergic rhinitis 09/17/2008   Intrinsic asthma 09/17/2008   Laryngopharyngeal reflux disease 09/17/2008   CHOLECYSTECTOMY, HX OF 09/17/2008    PCP: Sebastian Beverley NOVAK, MD  REFERRING PROVIDER: Sebastian Beverley NOVAK, MD  REFERRING DIAG:  Diagnosis  M81.0 (ICD-10-CM) - Age-related osteoporosis without current pathological fracture    THERAPY DIAG:  Other low back pain  Muscle weakness (generalized)  Abnormal posture  Rationale for Evaluation and Treatment: Rehabilitation  ONSET DATE: 12/12/2023  SUBJECTIVE:   SUBJECTIVE STATEMENT: Patient reports she is doing good today. She has been doing her home exercises. She feels some pressure in her hips  From Eval: Patient presents with age related osteoporosis. The MD did not explain to her what is. She reports having degenerative joint disease in her back and she wears a back brace everyday. She wears a back brace when she lifts  things like pots. She sometimes has tinglinh that goes down her legs. Her back pain started when she got an epidural during child birth.  PERTINENT HISTORY: Osteoporosis; OA; asthma; COPD; depression; HTN; see DEXA results below  PAIN:  Are you having pain? Yes: NPRS scale: 0/10 Pain location: lumbar spine into buttocks Pain description: achy; sharp Aggravating factors: bending; walking ; lifting Relieving factors: medication; forward trunk bend to touch her toes  PRECAUTIONS: None  RED FLAGS: None   WEIGHT  BEARING RESTRICTIONS: No  FALLS:  Has patient fallen in last 6 months? No  LIVING ENVIRONMENT: Lives with: lives with their son Lives in: House/apartment Stairs: Yes stairs but she does not use them. She stays on the first floor Has following equipment at home: None  OCCUPATION: Retired  PLOF: Independent, Independent with basic ADLs, Independent with household mobility without device, Independent with community mobility without device, Independent with gait, and Independent with transfers  PATIENT GOALS: To maintain independence  NEXT MD VISIT: October 2025  OBJECTIVE:  Note: Objective measures were completed at Evaluation unless otherwise noted.  DIAGNOSTIC FINDINGS: DEXA 6/25 LEFT FEMORAL NECK: T-score: -1.9  LEFT TOTAL HIP: T-score: -1.3   RIGHT FEMORAL NECK: T-score: -2.4  RIGHT TOTAL HIP: T-score: -1.6  LEFT FOREARM (RADIUS 33%): T-score: -2.5   PATIENT SURVEYS:  Modified Oswestry: 13/50 26% moderate disability  Minimally Clinically Important Difference (MCID) = 12.8%  COGNITION: Overall cognitive status: Within functional limits for tasks assessed     SENSATION: Patient has occasional tingling in her legs   MUSCLE LENGTH: Hamstrings:decreased bilateral   POSTURE: rounded shoulders and forward head   LOWER EXTREMITY ROM:WFL bilateral    LOWER EXTREMITY MMT:  MMT Right eval Left eval  Hip flexion 4- 4-  Hip extension    Hip abduction 4- 4-  Hip adduction 4- 4-  Hip internal rotation    Hip external rotation    Knee flexion 4 4  Knee extension 4 4  Ankle dorsiflexion    Ankle plantarflexion    Ankle inversion    Ankle eversion     (Blank rows = not tested)   FUNCTIONAL TESTS:  Eval: 5 times sit to stand: 24.91 sec with UE support Timed up and go (TUG): 10.85 sec  GAIT:  Comments: decreased cadence; narrow BOS                                                                                                                                 TREATMENT DATE:  02/15/2024 NuStep Level 4 - 5 mins- PT present to discuss status Seated TA activation + ball squeeze 2 x 10 Seated Adductor squeezes 2 x10 Seated modified deadlift 4#  x 10 Sit to stand x 10 then holding 4# DB x 10 Standing row & extension with red TB  x 10 Standing shoulder abduction with red TB x 10 Seated clam with red TB x 20 6 inch step ups  x 10 bilateral (forwards & lateral) Tandem walking on airex pad (forwards & back wards) x 4 laps Farmer's carry with 3# DB x 1 lap around both gyms     02/12/24 NuStep Level 3 - 8 mins- PT student present to discuss pt status  Seated hamstring stretch 2x30 sec Seated piriformis stretch 2x30 sec LAQ in seated 2x8 Seated ball press on lap for TA activation 3x8 Seated heel raises 2x10 Seated Adductor squeezes 2x10 Sit to stand x10  6 inch step ups up with right, down with right x10/up with left, down with left x10  02/05/2024 NuStep Level 4 - 8 mins- PT present to discuss status  Osteoporosis education with handouts per patient instructions on types of ex's beneficial for bone health  Heel drops 10x Foot stomps 10X Seated red band row 2x 10 Sit to stand 10x  Wall push ups 10x (Added to HEP- see below) Dead lift 5# kettlebell 2 sets of 5 (verbal cues for forward gaze) (Added to HEP- see below)    01/15/2024 Initial Evaluation & HEP created  Reviewed Osteoporosis handout. Provided patient with a copy and added it to patient instructions. Spoke about patient's back pain and how we can address with with therapy  PATIENT EDUCATION:  Education details: Provided patient education and discussion on quality of life and stress management. Discussed motivators, challenges, and goals for future.  Person educated: Patient Education method: Explanation, Demonstration, and Handouts Education comprehension: verbalized understanding, returned demonstration, and needs further education  HOME EXERCISE PROGRAM: Access Code:  [email or web address] URL: [email or web address] Date: 02/08/2024 Prepared by: Glade Pesa  Exercises - Seated Hamstring Stretch  - 1-2 x daily - 7 x weekly - 2 sets - 20-30s hold - Seated Figure 4 Piriformis Stretch  - 1-2 x daily - 7 x weekly - 2 sets - 20-30 hold - Sit to Stand with Armchair  - 1-2 x daily - 7 x weekly - 1-2 sets - 10 reps - Side Stepping with Resistance at Thighs  - 1-2 x daily - 7 x weekly - 3 sets - 10 reps - Heel Raises with Counter Support  - 1-2 x daily - 7 x weekly - 1-2 sets - 10 reps - Wall Push Up  - 1 x daily - 7 x weekly - 3 sets - 10 reps - Half Deadlift with Kettlebell  - 1 x daily - 7 x weekly - 2 sets - 5 reps   ASSESSMENT:  CLINICAL IMPRESSION: Treatment session focused on functional strengthening and weight bearing activities. Patient verbalized compliance with HEP and she feels she is getting stronger with skilled therapy. She was able to pick up an object without having any pain or discomfort in her back. Patient is tolerating current level of exercises well. PT provided verbal and visual cues as needed for correct exercise performance. Patient will benefit from skilled PT to address the below impairments and improve overall function.    OBJECTIVE IMPAIRMENTS: Abnormal gait, decreased balance, decreased mobility, decreased strength, hypomobility, increased muscle spasms, impaired flexibility, improper body mechanics, postural dysfunction, and pain.   ACTIVITY LIMITATIONS: lifting, bending, standing, squatting, sleeping, stairs, transfers, and locomotion level  PARTICIPATION LIMITATIONS: meal prep, cleaning, laundry, driving, shopping, and community activity  PERSONAL FACTORS: Age, Fitness, Time since onset of injury/illness/exacerbation, and 3+ comorbidities: Osteoporosis; OA; asthma; COPD; depression; HTN;  are also affecting patient's functional outcome.   REHAB POTENTIAL: Good  CLINICAL DECISION MAKING: Evolving/moderate  complexity  EVALUATION COMPLEXITY: Moderate   GOALS: Goals reviewed  with patient? Yes  SHORT TERM GOALS: Target date: 02/12/2024  Patient will be independent with initial HEP. Baseline:  Goal status: Ongoing  2.  Patient will demonstrate improved upright posture and spinal alignment during standing and sitting task to reduce mechanical strain.  Baseline:  Goal status: Progressing   3.   Patient will be able to participate in 3/6 minute walk test to establish a baseline measurement. Baseline:  Goal status: INITIAL  4.  Patient will perform 5STS in < or = to 20 sec due to improved LE strength and functional mobility to maintain independence. Baseline: 24.91 sec Goal status: Progressing    LONG TERM GOALS: Target date: 03/11/2024  Patient will demonstrate independence in advanced HEP. Baseline:  Goal status: INITIAL  2.  Patient will report > or = to 50% improvement in back pain since starting PT. Baseline:  Goal status: INITIAL  3.  Patient demonstrate correct lifting technique to prevent spontaneous fractures and mechanical strain. Baseline:  Goal status: INITIAL   4.  Patient will score < or = to 26/50 on ODI due to decreased self perceived disability. Baseline: 13/50 26% Goal status: INITIAL  5.  Patient will perform 5STS in < or = 16 sec for improved functional mobility and independence. Baseline: 24.91 sec Goal status: INITIAL  6.  Patient will demonstrate at least 4+/5 hip strength to improve performance of bending and lifting. Baseline:  Goal status: INITIAL   PLAN:  PT FREQUENCY: 1-2x/week  PT DURATION: 8 weeks  PLANNED INTERVENTIONS: 97164- PT Re-evaluation, 97110-Therapeutic exercises, 97530- Therapeutic activity, 97112- Neuromuscular re-education, 97535- Self Care, 02859- Manual therapy, [id number]- Gait training, [phone]- Canalith repositioning, [id number]- Aquatic Therapy, [phone]- Electrical stimulation (unattended), [phone]- Electrical stimulation (manual),  20560 (1-2 muscles), 20561 (3+ muscles)- Dry Needling, Patient/Family education, Balance training, Stair training, Taping, Joint mobilization, Joint manipulation, Vestibular training, Cryotherapy, and Moist heat  PLAN FOR NEXT SESSION: ; weight bearing activities; posture when standing, core stability.   Kristeen Sar, PT 02/15/24 11:12 AM [facility] [address], [address] [place], [place] [address] Phone # [phone] Fax [phone]

## 2024-02-19 ENCOUNTER — Ambulatory Visit: Attending: Family Medicine | Admitting: Physical Therapy

## 2024-02-19 ENCOUNTER — Telehealth: Payer: Self-pay | Admitting: Physical Therapy

## 2024-02-19 DIAGNOSIS — R293 Abnormal posture: Secondary | ICD-10-CM | POA: Insufficient documentation

## 2024-02-19 DIAGNOSIS — M6281 Muscle weakness (generalized): Secondary | ICD-10-CM | POA: Insufficient documentation

## 2024-02-19 DIAGNOSIS — R2689 Other abnormalities of gait and mobility: Secondary | ICD-10-CM | POA: Insufficient documentation

## 2024-02-19 DIAGNOSIS — M5459 Other low back pain: Secondary | ICD-10-CM | POA: Insufficient documentation

## 2024-02-19 NOTE — Telephone Encounter (Signed)
 Left patient a message about missed appointment. Reminded patient of next scheduled appointment and left a call back number to clinic.   Kristeen Sar, PT 02/19/24 3:13 PM

## 2024-02-22 ENCOUNTER — Encounter: Payer: Self-pay | Admitting: Rehabilitative and Restorative Service Providers"

## 2024-02-22 ENCOUNTER — Ambulatory Visit: Admitting: Rehabilitative and Restorative Service Providers"

## 2024-02-22 DIAGNOSIS — R293 Abnormal posture: Secondary | ICD-10-CM

## 2024-02-22 DIAGNOSIS — M5459 Other low back pain: Secondary | ICD-10-CM

## 2024-02-22 DIAGNOSIS — R2689 Other abnormalities of gait and mobility: Secondary | ICD-10-CM | POA: Diagnosis present

## 2024-02-22 DIAGNOSIS — M6281 Muscle weakness (generalized): Secondary | ICD-10-CM

## 2024-02-22 NOTE — Therapy (Signed)
 OUTPATIENT PHYSICAL THERAPY TREATMENT NOTE   Patient Name: [name] MRN: [id number] DOB:August 18, 1935, 88 y.o., female Today's Date: 02/22/2024  END OF SESSION:  PT End of Session - 02/22/24 1045     Visit Number 6    Date for PT Re-Evaluation 03/11/24    Authorization Type [employer]   [employer] APPROVED 16 VISITS 01/15/2024 - 03/11/2024 #[id number]    Authorization Time Period 01/15/2024 - 03/11/2024    Authorization - Visit Number 6    Authorization - Number of Visits 16    Progress Note Due on Visit 10    PT Start Time 1013    PT Stop Time 1045   Pt requested to leave early   PT Time Calculation (min) 32 min    Activity Tolerance Patient tolerated treatment well    Behavior During Therapy WFL for tasks assessed/performed              Past Medical History:  Diagnosis Date   Arthritis    Asthma    Blood clotting disorder (HCC)    Cataract    COPD (chronic obstructive pulmonary disease) (HCC)    Depression    Diverticulosis 10-2011   Colonoscopy   GERD (gastroesophageal reflux disease)    Hemorrhoids, internal    Hx of colonic polyp    Hyperlipemia    Hypertension    IBS (irritable bowel syndrome)    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   EGD   TIA (transient ischemic attack)    Past Surgical History:  Procedure Laterality Date   CESAREAN SECTION  1962   LAPAROSCOPIC CHOLECYSTECTOMY  1995   Patient Active Problem List   Diagnosis Date Noted   Asthmatic bronchitis , chronic (HCC) 12/26/2023   Age-related osteoporosis without current pathological fracture 12/13/2023   Anaphylactic syndrome 11/29/2023   Duffy-Null Associated Neutrophil Count (DNANC) 10/09/2023   Stenosing tenosynovitis 10/09/2023   CKD stage 3b, GFR 30-44 ml/min (HCC) 10/09/2023   Postmenopausal estrogen deficiency 10/09/2023   Keratoconjunctivitis sicca of both eyes not due to Sjogren's syndrome 09/11/2023   Bilateral sciatica 09/11/2023   Anemia 09/11/2023   Leucopenia 09/11/2023    Palpitations 04/28/2023   Myalgia 07/21/2019   Thrush 07/21/2019   Dyslipidemia 05/24/2018   Chest pain 05/24/2018   Abnormal EKG 05/24/2018   Pleurisy 09/13/2017   Neck pain 10/21/2014   Low back pain 10/21/2014   Essential hypertension 07/15/2014   HLD (hyperlipidemia) 03/24/2014   Headache on top of head 03/24/2014   TIA (transient ischemic attack) 03/13/2014   Stricture and stenosis of esophagus 12/13/2011   Family history of malignant neoplasm of gastrointestinal tract 11/01/2011   History of colonic polyps 11/01/2011   Allergic rhinitis 09/17/2008   Intrinsic asthma 09/17/2008   Laryngopharyngeal reflux disease 09/17/2008   CHOLECYSTECTOMY, HX OF 09/17/2008    PCP: Sebastian Beverley NOVAK, MD  REFERRING PROVIDER: Sebastian Beverley NOVAK, MD  REFERRING DIAG:  Diagnosis  M81.0 (ICD-10-CM) - Age-related osteoporosis without current pathological fracture    THERAPY DIAG:  Other low back pain  Muscle weakness (generalized)  Abnormal posture  Rationale for Evaluation and Treatment: Rehabilitation  ONSET DATE: 12/12/2023  SUBJECTIVE:   SUBJECTIVE STATEMENT: Pt reports going for a walk for 10 min prior to coming to therapy. She states having no pain.   From Eval: Patient presents with age related osteoporosis. The MD did not explain to her what is. She reports having degenerative joint disease in her back and she wears a back brace everyday. She wears  a back brace when she lifts things like pots. She sometimes has tinglinh that goes down her legs. Her back pain started when she got an epidural during child birth.  PERTINENT HISTORY: Osteoporosis; OA; asthma; COPD; depression; HTN; see DEXA results below  PAIN:  Are you having pain? Yes: NPRS scale: 0/10 Pain location: lumbar spine into buttocks Pain description: achy; sharp Aggravating factors: bending; walking ; lifting Relieving factors: medication; forward trunk bend to touch her toes  PRECAUTIONS: None  RED  FLAGS: None   WEIGHT BEARING RESTRICTIONS: No  FALLS:  Has patient fallen in last 6 months? No  LIVING ENVIRONMENT: Lives with: lives with their son Lives in: House/apartment Stairs: Yes stairs but she does not use them. She stays on the first floor Has following equipment at home: None  OCCUPATION: Retired  PLOF: Independent, Independent with basic ADLs, Independent with household mobility without device, Independent with community mobility without device, Independent with gait, and Independent with transfers  PATIENT GOALS: To maintain independence  NEXT MD VISIT: October 2025  OBJECTIVE:  Note: Objective measures were completed at Evaluation unless otherwise noted.  DIAGNOSTIC FINDINGS: DEXA 6/25 LEFT FEMORAL NECK: T-score: -1.9  LEFT TOTAL HIP: T-score: -1.3   RIGHT FEMORAL NECK: T-score: -2.4  RIGHT TOTAL HIP: T-score: -1.6  LEFT FOREARM (RADIUS 33%): T-score: -2.5   PATIENT SURVEYS:  Eval:  Modified Oswestry: 13/50 26% moderate disability  Minimally Clinically Important Difference (MCID) = 12.8%  COGNITION: Overall cognitive status: Within functional limits for tasks assessed     SENSATION: Patient has occasional tingling in her legs   MUSCLE LENGTH: Hamstrings:decreased bilateral   POSTURE: rounded shoulders and forward head   LOWER EXTREMITY ROM:WFL bilateral    LOWER EXTREMITY MMT:  MMT Right eval Left eval  Hip flexion 4- 4-  Hip extension    Hip abduction 4- 4-  Hip adduction 4- 4-  Hip internal rotation    Hip external rotation    Knee flexion 4 4  Knee extension 4 4  Ankle dorsiflexion    Ankle plantarflexion    Ankle inversion    Ankle eversion     (Blank rows = not tested)   FUNCTIONAL TESTS:  Eval: 5 times sit to stand: 24.91 sec with UE support Timed up and go (TUG): 10.85 sec  GAIT:  Comments: decreased cadence; narrow BOS                                                                                                                                 TREATMENT DATE:   02/22/24 NuStep Level 4 - 3 mins- PT/SPT present to discuss status Seated TA activation + ball squeeze 2 x 10 Seated Adductor squeezes 2 x10 Seated modified deadlift 4#  x 10 LAQ in seated x10 each side Sit to stand x 10 then holding 4# DB x 10 Standing row with red TB  x 10 Standing shoulder abduction with red TB  x 10-needed verbal cues for relaxing shoulders Tandem walking on airex pad (forwards & back wards) x 4 laps (down and back is 1 lap) Lateral side stepping on airex pad x2 laps  02/15/2024 NuStep Level 4 - 5 mins- PT present to discuss status Seated TA activation + ball squeeze 2 x 10 Seated Adductor squeezes 2 x10 Seated modified deadlift 4#  x 10 Sit to stand x 10 then holding 4# DB x 10 Standing row & extension with red TB  x 10 Standing shoulder abduction with red TB x 10 Seated clam with red TB x 20 6 inch step ups x 10 bilateral (forwards & lateral) Tandem walking on airex pad (forwards & back wards) x 4 laps Farmer's carry with 3# DB x 1 lap around both gyms     02/12/24 NuStep Level 3 - 8 mins- PT student present to discuss pt status  Seated hamstring stretch 2x30 sec Seated piriformis stretch 2x30 sec LAQ in seated 2x8 Seated ball press on lap for TA activation 3x8 Seated heel raises 2x10 Seated Adductor squeezes 2x10 Sit to stand x10  6 inch step ups up with right, down with right x10/up with left, down with left x10   PATIENT EDUCATION:  Education details: Provided patient education and discussion on quality of life and stress management. Discussed motivators, challenges, and goals for future.  Person educated: Patient Education method: Explanation, Demonstration, and Handouts Education comprehension: verbalized understanding, returned demonstration, and needs further education  HOME EXERCISE PROGRAM: Access Code: [email or web address] URL: [email or web address] Date: 02/08/2024 Prepared  by: Glade Pesa  Exercises - Seated Hamstring Stretch  - 1-2 x daily - 7 x weekly - 2 sets - 20-30s hold - Seated Figure 4 Piriformis Stretch  - 1-2 x daily - 7 x weekly - 2 sets - 20-30 hold - Sit to Stand with Armchair  - 1-2 x daily - 7 x weekly - 1-2 sets - 10 reps - Side Stepping with Resistance at Thighs  - 1-2 x daily - 7 x weekly - 3 sets - 10 reps - Heel Raises with Counter Support  - 1-2 x daily - 7 x weekly - 1-2 sets - 10 reps - Wall Push Up  - 1 x daily - 7 x weekly - 3 sets - 10 reps - Half Deadlift with Kettlebell  - 1 x daily - 7 x weekly - 2 sets - 5 reps   ASSESSMENT:  CLINICAL IMPRESSION:   Pt reports her whole body feels better after starting PT, esp in her back and legs. Today's session targeted functional strengthening and neuromuscular reeducation. Initially pt struggled to activate core/glutes during modified dead lift, SPT placed 6 in on floor- pt redirected to lift dumbell from the step instead of floor. Pt significantly improved back body mechanics as well as verbally communicated she felt more stable. Pt required verbal cues to relax shoulders from demonstrating overactive bil upper traps during shoulder abd exercise. Pt will benefit from continuing skilled therapy to address the deficits below.   OBJECTIVE IMPAIRMENTS: Abnormal gait, decreased balance, decreased mobility, decreased strength, hypomobility, increased muscle spasms, impaired flexibility, improper body mechanics, postural dysfunction, and pain.   ACTIVITY LIMITATIONS: lifting, bending, standing, squatting, sleeping, stairs, transfers, and locomotion level  PARTICIPATION LIMITATIONS: meal prep, cleaning, laundry, driving, shopping, and community activity  PERSONAL FACTORS: Age, Fitness, Time since onset of injury/illness/exacerbation, and 3+ comorbidities: Osteoporosis; OA; asthma; COPD; depression; HTN;  are also affecting patient's functional outcome.   REHAB POTENTIAL: Good  CLINICAL DECISION  MAKING: Evolving/moderate complexity  EVALUATION COMPLEXITY: Moderate   GOALS: Goals reviewed with patient? Yes  SHORT TERM GOALS: Target date: 02/12/2024  Patient will be independent with initial HEP. Baseline:  Goal status: Ongoing  2.  Patient will demonstrate improved upright posture and spinal alignment during standing and sitting task to reduce mechanical strain.  Baseline:  Goal status: Progressing   3.   Patient will be able to participate in 3/6 minute walk test to establish a baseline measurement. Baseline:  Goal status: GOAL MET on 02/22/24 Pt stated walking 10 min prior to attending session  4.  Patient will perform 5STS in < or = to 20 sec due to improved LE strength and functional mobility to maintain independence. Baseline: 24.91 sec Goal status: Progressing    LONG TERM GOALS: Target date: 03/11/2024  Patient will demonstrate independence in advanced HEP. Baseline:  Goal status: INITIAL  2.  Patient will report > or = to 50% improvement in back pain since starting PT. Baseline:  Goal status: Progressing   3.  Patient demonstrate correct lifting technique to prevent spontaneous fractures and mechanical strain. Baseline:  Goal status: Progressing   4.  Patient will score < or = to 26/50 on ODI due to decreased self perceived disability. Baseline: 13/50 26% Goal status: INITIAL  5.  Patient will perform 5STS in < or = 16 sec for improved functional mobility and independence. Baseline: 24.91 sec Goal status: Progressing   6.  Patient will demonstrate at least 4+/5 hip strength to improve performance of bending and lifting. Baseline:  Goal status: Progressing   PLAN:  PT FREQUENCY: 1-2x/week  PT DURATION: 8 weeks  PLANNED INTERVENTIONS: [id number]- PT Re-evaluation, 97110-Therapeutic exercises, 97530- Therapeutic activity, 97112- Neuromuscular re-education, 97535- Self Care, [id number]- Manual therapy, [phone]- Gait training, [phone]- Canalith repositioning,  [id number]- Aquatic Therapy, [phone]- Electrical stimulation (unattended), [phone]- Electrical stimulation (manual), 20560 (1-2 muscles), 20561 (3+ muscles)- Dry Needling, Patient/Family education, Balance training, Stair training, Taping, Joint mobilization, Joint manipulation, Vestibular training, Cryotherapy, and Moist heat  PLAN FOR NEXT SESSION:  weight bearing activities; posture when standing, core stability, modified dead lifts, pt education for body mechanics, balance, airex, address STGs  Lavanda Cleverly, SPT 02/22/24 11:28 AM  I agree with the following treatment note after reviewing documentation. This session was performed under the supervision of a licensed clinician. Jarrell Laming, PT, DPT 02/22/24, 11:28 AM  [facility] [address] [place], [place] [address] Phone # [phone] Fax [phone]

## 2024-02-26 ENCOUNTER — Encounter: Payer: Self-pay | Admitting: Physical Therapy

## 2024-02-26 ENCOUNTER — Ambulatory Visit: Admitting: Physical Therapy

## 2024-02-26 DIAGNOSIS — R293 Abnormal posture: Secondary | ICD-10-CM

## 2024-02-26 DIAGNOSIS — M5459 Other low back pain: Secondary | ICD-10-CM

## 2024-02-26 DIAGNOSIS — M6281 Muscle weakness (generalized): Secondary | ICD-10-CM

## 2024-02-26 NOTE — Therapy (Signed)
 OUTPATIENT PHYSICAL THERAPY TREATMENT NOTE   Patient Name: [name] MRN: [id number] DOB:11-10-1935, 88 y.o., female Today's Date: 02/26/2024  END OF SESSION:  PT End of Session - 02/26/24 1616     Visit Number 7    Date for PT Re-Evaluation 03/11/24    Authorization Type [employer]   [employer] APPROVED 16 VISITS 01/15/2024 - 03/11/2024 #[id number]    Authorization Time Period 01/15/2024 - 03/11/2024    Authorization - Visit Number 7    Authorization - Number of Visits 16    Progress Note Due on Visit 10    PT Start Time 1515    PT Stop Time 1557    PT Time Calculation (min) 42 min    Activity Tolerance Patient tolerated treatment well    Behavior During Therapy WFL for tasks assessed/performed               Past Medical History:  Diagnosis Date   Arthritis    Asthma    Blood clotting disorder (HCC)    Cataract    COPD (chronic obstructive pulmonary disease) (HCC)    Depression    Diverticulosis 10-2011   Colonoscopy   GERD (gastroesophageal reflux disease)    Hemorrhoids, internal    Hx of colonic polyp    Hyperlipemia    Hypertension    IBS (irritable bowel syndrome)    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   EGD   TIA (transient ischemic attack)    Past Surgical History:  Procedure Laterality Date   CESAREAN SECTION  1962   LAPAROSCOPIC CHOLECYSTECTOMY  1995   Patient Active Problem List   Diagnosis Date Noted   Asthmatic bronchitis , chronic (HCC) 12/26/2023   Age-related osteoporosis without current pathological fracture 12/13/2023   Anaphylactic syndrome 11/29/2023   Duffy-Null Associated Neutrophil Count (DNANC) 10/09/2023   Stenosing tenosynovitis 10/09/2023   CKD stage 3b, GFR 30-44 ml/min (HCC) 10/09/2023   Postmenopausal estrogen deficiency 10/09/2023   Keratoconjunctivitis sicca of both eyes not due to Sjogren's syndrome 09/11/2023   Bilateral sciatica 09/11/2023   Anemia 09/11/2023   Leucopenia 09/11/2023   Palpitations 04/28/2023    Myalgia 07/21/2019   Thrush 07/21/2019   Dyslipidemia 05/24/2018   Chest pain 05/24/2018   Abnormal EKG 05/24/2018   Pleurisy 09/13/2017   Neck pain 10/21/2014   Low back pain 10/21/2014   Essential hypertension 07/15/2014   HLD (hyperlipidemia) 03/24/2014   Headache on top of head 03/24/2014   TIA (transient ischemic attack) 03/13/2014   Stricture and stenosis of esophagus 12/13/2011   Family history of malignant neoplasm of gastrointestinal tract 11/01/2011   History of colonic polyps 11/01/2011   Allergic rhinitis 09/17/2008   Intrinsic asthma 09/17/2008   Laryngopharyngeal reflux disease 09/17/2008   CHOLECYSTECTOMY, HX OF 09/17/2008    PCP: Sebastian Beverley NOVAK, MD  REFERRING PROVIDER: Sebastian Beverley NOVAK, MD  REFERRING DIAG:  Diagnosis  M81.0 (ICD-10-CM) - Age-related osteoporosis without current pathological fracture    THERAPY DIAG:  Other low back pain  Muscle weakness (generalized)  Abnormal posture  Rationale for Evaluation and Treatment: Rehabilitation  ONSET DATE: 12/12/2023  SUBJECTIVE:   SUBJECTIVE STATEMENT: Patient reports she has been compliant with HEP. She is not currently having any pain  From Eval: Patient presents with age related osteoporosis. The MD did not explain to her what is. She reports having degenerative joint disease in her back and she wears a back brace everyday. She wears a back brace when she lifts things like pots. She  sometimes has tinglinh that goes down her legs. Her back pain started when she got an epidural during child birth.  PERTINENT HISTORY: Osteoporosis; OA; asthma; COPD; depression; HTN; see DEXA results below  PAIN:  Are you having pain? Yes: NPRS scale: 0/10 Pain location: lumbar spine into buttocks Pain description: achy; sharp Aggravating factors: bending; walking ; lifting Relieving factors: medication; forward trunk bend to touch her toes  PRECAUTIONS: None  RED FLAGS: None   WEIGHT BEARING RESTRICTIONS:  No  FALLS:  Has patient fallen in last 6 months? No  LIVING ENVIRONMENT: Lives with: lives with their son Lives in: House/apartment Stairs: Yes stairs but she does not use them. She stays on the first floor Has following equipment at home: None  OCCUPATION: Retired  PLOF: Independent, Independent with basic ADLs, Independent with household mobility without device, Independent with community mobility without device, Independent with gait, and Independent with transfers  PATIENT GOALS: To maintain independence  NEXT MD VISIT: October 2025  OBJECTIVE:  Note: Objective measures were completed at Evaluation unless otherwise noted.  DIAGNOSTIC FINDINGS: DEXA 6/25 LEFT FEMORAL NECK: T-score: -1.9  LEFT TOTAL HIP: T-score: -1.3   RIGHT FEMORAL NECK: T-score: -2.4  RIGHT TOTAL HIP: T-score: -1.6  LEFT FOREARM (RADIUS 33%): T-score: -2.5   PATIENT SURVEYS:  Eval:  Modified Oswestry: 13/50 26% moderate disability  Minimally Clinically Important Difference (MCID) = 12.8%  COGNITION: Overall cognitive status: Within functional limits for tasks assessed     SENSATION: Patient has occasional tingling in her legs   MUSCLE LENGTH: Hamstrings:decreased bilateral   POSTURE: rounded shoulders and forward head   LOWER EXTREMITY ROM:WFL bilateral    LOWER EXTREMITY MMT:  MMT Right eval Left eval  Hip flexion 4- 4-  Hip extension    Hip abduction 4- 4-  Hip adduction 4- 4-  Hip internal rotation    Hip external rotation    Knee flexion 4 4  Knee extension 4 4  Ankle dorsiflexion    Ankle plantarflexion    Ankle inversion    Ankle eversion     (Blank rows = not tested)   FUNCTIONAL TESTS:  Eval: 5 times sit to stand: 24.91 sec with UE support Timed up and go (TUG): 10.85 sec  GAIT:  Comments: decreased cadence; narrow BOS                                                                                                                                 TREATMENT DATE:  02/26/24 NuStep Level 3 - 7 mins- PT present to discuss status Iso shoulder flexion with 3# DB + march 2 x 12 Sit to stand x 10 then holding 4# DB x 10 Standing row & extension with red TB  x 12 6 inch step ups x 20 bilateral  Seated modified deadlift 4#  x 10 Hurdles (step over pattern) unilateral 4# DB hold x 2 laps Hurdles (lateral) x 2 laps  Sit to stand holding 4# DB x 15 Seated clam with red loop x 20 Single leg on therapad + opposite leg swings (forward,sideways, backwards) x 8 each direction bilateral  Standing biceps curl 3# x 10 bilateral    02/22/24 NuStep Level 4 - 3 mins- PT/SPT present to discuss status Seated TA activation + ball squeeze 2 x 10 Seated Adductor squeezes 2 x10 Seated modified deadlift 4#  x 10 LAQ in seated x10 each side Sit to stand x 10 then holding 4# DB x 10 Standing row with red TB  x 10 Standing shoulder abduction with red TB x 10-needed verbal cues for relaxing shoulders Tandem walking on airex pad (forwards & back wards) x 4 laps (down and back is 1 lap) Lateral side stepping on airex pad x2 laps  02/15/2024 NuStep Level 4 - 5 mins- PT present to discuss status Seated TA activation + ball squeeze 2 x 10 Seated Adductor squeezes 2 x10 Seated modified deadlift 4#  x 10 Sit to stand x 10 then holding 4# DB x 10 Standing row & extension with red TB  x 10 Standing shoulder abduction with red TB x 10 Seated clam with red TB x 20 6 inch step ups x 10 bilateral (forwards & lateral) Tandem walking on airex pad (forwards & back wards) x 4 laps Farmer's carry with 3# DB x 1 lap around both gyms     PATIENT EDUCATION:  Education details: Provided patient education and discussion on quality of life and stress management. Discussed motivators, challenges, and goals for future.  Person educated: Patient Education method: Explanation, Demonstration, and Handouts Education comprehension: verbalized understanding, returned  demonstration, and needs further education  HOME EXERCISE PROGRAM: Access Code: [email or web address] URL: [email or web address] Date: 02/08/2024 Prepared by: Glade Pesa  Exercises - Seated Hamstring Stretch  - 1-2 x daily - 7 x weekly - 2 sets - 20-30s hold - Seated Figure 4 Piriformis Stretch  - 1-2 x daily - 7 x weekly - 2 sets - 20-30 hold - Sit to Stand with Armchair  - 1-2 x daily - 7 x weekly - 1-2 sets - 10 reps - Side Stepping with Resistance at Thighs  - 1-2 x daily - 7 x weekly - 3 sets - 10 reps - Heel Raises with Counter Support  - 1-2 x daily - 7 x weekly - 1-2 sets - 10 reps - Wall Push Up  - 1 x daily - 7 x weekly - 3 sets - 10 reps - Half Deadlift with Kettlebell  - 1 x daily - 7 x weekly - 2 sets - 5 reps   ASSESSMENT:  CLINICAL IMPRESSION: Krithika  has increased her activity since starting therapy. She felt a little more fatigued during treatment session since she walked for 10 mins outside before treatment session. Educated patient on energy conservation techniques and days she is more busy running errands to decreased walking time. She was in agreement with this. Incorporated more single leg balance exercises today and they were a good challenge for patient. She required unilateral UE support at barre to maintain balance. Patient will benefit from skilled PT to address the below impairments and improve overall function.   OBJECTIVE IMPAIRMENTS: Abnormal gait, decreased balance, decreased mobility, decreased strength, hypomobility, increased muscle spasms, impaired flexibility, improper body mechanics, postural dysfunction, and pain.   ACTIVITY LIMITATIONS: lifting, bending, standing, squatting, sleeping, stairs, transfers, and locomotion level  PARTICIPATION LIMITATIONS: meal prep, cleaning, laundry, driving, shopping, and community activity  PERSONAL  FACTORS: Age, Fitness, Time since onset of injury/illness/exacerbation, and 3+ comorbidities: Osteoporosis; OA;  asthma; COPD; depression; HTN;  are also affecting patient's functional outcome.   REHAB POTENTIAL: Good  CLINICAL DECISION MAKING: Evolving/moderate complexity  EVALUATION COMPLEXITY: Moderate   GOALS: Goals reviewed with patient? Yes  SHORT TERM GOALS: Target date: 02/12/2024  Patient will be independent with initial HEP. Baseline:  Goal status: Ongoing  2.  Patient will demonstrate improved upright posture and spinal alignment during standing and sitting task to reduce mechanical strain.  Baseline:  Goal status: Progressing   3.   Patient will be able to participate in 3/6 minute walk test to establish a baseline measurement. Baseline:  Goal status: GOAL MET on 02/22/24 Pt stated walking 10 min prior to attending session  4.  Patient will perform 5STS in < or = to 20 sec due to improved LE strength and functional mobility to maintain independence. Baseline: 24.91 sec Goal status: Progressing    LONG TERM GOALS: Target date: 03/11/2024  Patient will demonstrate independence in advanced HEP. Baseline:  Goal status: INITIAL  2.  Patient will report > or = to 50% improvement in back pain since starting PT. Baseline:  Goal status: Progressing   3.  Patient demonstrate correct lifting technique to prevent spontaneous fractures and mechanical strain. Baseline:  Goal status: Progressing   4.  Patient will score < or = to 26/50 on ODI due to decreased self perceived disability. Baseline: 13/50 26% Goal status: INITIAL  5.  Patient will perform 5STS in < or = 16 sec for improved functional mobility and independence. Baseline: 24.91 sec Goal status: Progressing   6.  Patient will demonstrate at least 4+/5 hip strength to improve performance of bending and lifting. Baseline:  Goal status: Progressing   PLAN:  PT FREQUENCY: 1-2x/week  PT DURATION: 8 weeks  PLANNED INTERVENTIONS: 97164- PT Re-evaluation, 97110-Therapeutic exercises, 97530- Therapeutic activity,  97112- Neuromuscular re-education, 97535- Self Care, [id number]- Manual therapy, [phone]- Gait training, [phone]- Canalith repositioning, [id number]- Aquatic Therapy, [phone]- Electrical stimulation (unattended), [phone]- Electrical stimulation (manual), 20560 (1-2 muscles), 20561 (3+ muscles)- Dry Needling, Patient/Family education, Balance training, Stair training, Taping, Joint mobilization, Joint manipulation, Vestibular training, Cryotherapy, and Moist heat  PLAN FOR NEXT SESSION: assess STG; functional strengthening; posture when standing, core stability, modified dead lifts, pt education for body mechanics, balance, airex   Kristeen Sar, PT 02/26/24 4:17 PM [facility] [address] [place], [place] [address] Phone # [phone] Fax [phone]

## 2024-02-28 ENCOUNTER — Ambulatory Visit: Admitting: Physical Therapy

## 2024-02-28 ENCOUNTER — Encounter: Payer: Self-pay | Admitting: Physical Therapy

## 2024-02-28 DIAGNOSIS — R293 Abnormal posture: Secondary | ICD-10-CM

## 2024-02-28 DIAGNOSIS — M5459 Other low back pain: Secondary | ICD-10-CM | POA: Diagnosis not present

## 2024-02-28 DIAGNOSIS — M6281 Muscle weakness (generalized): Secondary | ICD-10-CM

## 2024-02-28 NOTE — Therapy (Signed)
 OUTPATIENT PHYSICAL THERAPY TREATMENT NOTE   Patient Name: [name] MRN: [id number] DOB:1935-10-30, 88 y.o., female Today's Date: 02/28/2024  END OF SESSION:  PT End of Session - 02/28/24 1603     Visit Number 8    Date for PT Re-Evaluation 03/11/24    Authorization Type [employer]   [employer] APPROVED 16 VISITS 01/15/2024 - 03/11/2024 #[id number]    Authorization Time Period 01/15/2024 - 03/11/2024    Authorization - Visit Number 8    Authorization - Number of Visits 16    Progress Note Due on Visit 10    PT Start Time 1446    PT Stop Time 1526    PT Time Calculation (min) 40 min    Activity Tolerance Patient tolerated treatment well    Behavior During Therapy WFL for tasks assessed/performed                Past Medical History:  Diagnosis Date   Arthritis    Asthma    Blood clotting disorder (HCC)    Cataract    COPD (chronic obstructive pulmonary disease) (HCC)    Depression    Diverticulosis 10-2011   Colonoscopy   GERD (gastroesophageal reflux disease)    Hemorrhoids, internal    Hx of colonic polyp    Hyperlipemia    Hypertension    IBS (irritable bowel syndrome)    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   EGD   TIA (transient ischemic attack)    Past Surgical History:  Procedure Laterality Date   CESAREAN SECTION  1962   LAPAROSCOPIC CHOLECYSTECTOMY  1995   Patient Active Problem List   Diagnosis Date Noted   Asthmatic bronchitis , chronic (HCC) 12/26/2023   Age-related osteoporosis without current pathological fracture 12/13/2023   Anaphylactic syndrome 11/29/2023   Duffy-Null Associated Neutrophil Count (DNANC) 10/09/2023   Stenosing tenosynovitis 10/09/2023   CKD stage 3b, GFR 30-44 ml/min (HCC) 10/09/2023   Postmenopausal estrogen deficiency 10/09/2023   Keratoconjunctivitis sicca of both eyes not due to Sjogren's syndrome 09/11/2023   Bilateral sciatica 09/11/2023   Anemia 09/11/2023   Leucopenia 09/11/2023   Palpitations 04/28/2023    Myalgia 07/21/2019   Thrush 07/21/2019   Dyslipidemia 05/24/2018   Chest pain 05/24/2018   Abnormal EKG 05/24/2018   Pleurisy 09/13/2017   Neck pain 10/21/2014   Low back pain 10/21/2014   Essential hypertension 07/15/2014   HLD (hyperlipidemia) 03/24/2014   Headache on top of head 03/24/2014   TIA (transient ischemic attack) 03/13/2014   Stricture and stenosis of esophagus 12/13/2011   Family history of malignant neoplasm of gastrointestinal tract 11/01/2011   History of colonic polyps 11/01/2011   Allergic rhinitis 09/17/2008   Intrinsic asthma 09/17/2008   Laryngopharyngeal reflux disease 09/17/2008   CHOLECYSTECTOMY, HX OF 09/17/2008    PCP: Sebastian Beverley NOVAK, MD  REFERRING PROVIDER: Sebastian Beverley NOVAK, MD  REFERRING DIAG:  Diagnosis  M81.0 (ICD-10-CM) - Age-related osteoporosis without current pathological fracture    THERAPY DIAG:  Other low back pain  Muscle weakness (generalized)  Abnormal posture  Rationale for Evaluation and Treatment: Rehabilitation  ONSET DATE: 12/12/2023  SUBJECTIVE:   SUBJECTIVE STATEMENT: Patient reports she is doing good today. She felt really good after last treatment session.  From Eval: Patient presents with age related osteoporosis. The MD did not explain to her what is. She reports having degenerative joint disease in her back and she wears a back brace everyday. She wears a back brace when she lifts things like pots.  She sometimes has tinglinh that goes down her legs. Her back pain started when she got an epidural during child birth.  PERTINENT HISTORY: Osteoporosis; OA; asthma; COPD; depression; HTN; see DEXA results below  PAIN:  Are you having pain? Yes: NPRS scale: 0/10 Pain location: lumbar spine into buttocks Pain description: achy; sharp Aggravating factors: bending; walking ; lifting Relieving factors: medication; forward trunk bend to touch her toes  PRECAUTIONS: None  RED FLAGS: None   WEIGHT BEARING  RESTRICTIONS: No  FALLS:  Has patient fallen in last 6 months? No  LIVING ENVIRONMENT: Lives with: lives with their son Lives in: House/apartment Stairs: Yes stairs but she does not use them. 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  DIAGNOSTIC FINDINGS: DEXA 6/25 LEFT FEMORAL NECK: T-score: -1.9  LEFT TOTAL HIP: T-score: -1.3   RIGHT FEMORAL NECK: T-score: -2.4  RIGHT TOTAL HIP: T-score: -1.6  LEFT FOREARM (RADIUS 33%): T-score: -2.5   PATIENT SURVEYS:  Eval:  Modified Oswestry: 13/50 26% moderate disability  Minimally Clinically Important Difference (MCID) = 12.8%  9/11 16/50 32%  COGNITION: Overall cognitive status: Within functional limits for tasks assessed     SENSATION: Patient has occasional tingling in her legs   MUSCLE LENGTH: Hamstrings:decreased bilateral   POSTURE: rounded shoulders and forward head   LOWER EXTREMITY ROM:WFL bilateral    LOWER EXTREMITY MMT:  MMT Right eval Left eval  Hip flexion 4- 4-  Hip extension    Hip abduction 4- 4-  Hip adduction 4- 4-  Hip internal rotation    Hip external rotation    Knee flexion 4 4  Knee extension 4 4  Ankle dorsiflexion    Ankle plantarflexion    Ankle inversion    Ankle eversion     (Blank rows = not tested)   FUNCTIONAL TESTS:  Eval: 5 times sit to stand: 24.91 sec with UE support Timed up and go (TUG): 10.85 sec  9/11: 5STS: 13.69  GAIT:  Comments: decreased cadence; narrow BOS                                                                                                                                 TREATMENT DATE:  02/28/24 NuStep Level 5 - 7 mins- PT present to discuss status 5STS: 13.69 ODI: 16/50 32% Hip Matrix (abduction & flexion) x 15# 2 x 12 bilateral  Seated modified deadlift 4#  x 10 Hurdles (step over pattern) unilateral 4# DB hold x 2 laps Hurdles (lateral) x 2 laps 6 in step ups holding  4# DB x 10 bilateral  Standing biceps curl 4# x 10 bilateral      02/26/24 NuStep Level 3 - 7 mins- PT present to discuss status Iso shoulder flexion with 3# DB + march 2 x 12 Sit to stand x 10 then holding 4# DB x 10 Standing row & extension with red TB  x 12 6 inch step ups x 20 bilateral  Seated modified deadlift 4#  x 10 Hurdles (step over pattern) unilateral 4# DB hold x 2 laps Hurdles (lateral) x 2 laps Sit to stand holding 4# DB x 15 Seated clam with red loop x 20 Single leg on therapad + opposite leg swings (forward,sideways, backwards) x 8 each direction bilateral  Standing biceps curl 3# x 10 bilateral    02/22/24 NuStep Level 4 - 3 mins- PT/SPT present to discuss status Seated TA activation + ball squeeze 2 x 10 Seated Adductor squeezes 2 x10 Seated modified deadlift 4#  x 10 LAQ in seated x10 each side Sit to stand x 10 then holding 4# DB x 10 Standing row with red TB  x 10 Standing shoulder abduction with red TB x 10-needed verbal cues for relaxing shoulders Tandem walking on airex pad (forwards & back wards) x 4 laps (down and back is 1 lap) Lateral side stepping on airex pad x2 laps   PATIENT EDUCATION:  Education details: Provided patient education and discussion on quality of life and stress management. Discussed motivators, challenges, and goals for future.  Person educated: Patient Education method: Explanation, Demonstration, and Handouts Education comprehension: verbalized understanding, returned demonstration, and needs further education  HOME EXERCISE  PROGRAM: Access Code: [email or web address] URL: [email or web address] Date: 02/08/2024 Prepared by: Glade Pesa  Exercises - Seated Hamstring Stretch  - 1-2 x daily - 7 x weekly - 2 sets - 20-30s hold - Seated Figure 4 Piriformis Stretch  - 1-2 x daily - 7 x weekly - 2 sets - 20-30 hold - Sit to Stand with Armchair  - 1-2 x daily - 7 x weekly - 1-2 sets - 10 reps - Side Stepping with Resistance at Thighs  - 1-2 x daily - 7 x weekly - 3 sets - 10 reps - Heel Raises with Counter Support  - 1-2 x daily - 7 x weekly - 1-2 sets - 10 reps - Wall Push Up  - 1 x daily - 7 x weekly - 3 sets - 10 reps - Half Deadlift with Kettlebell  - 1 x daily - 7 x weekly - 2 sets - 5 reps   ASSESSMENT:  CLINICAL IMPRESSION: Huldah  is progressing well with skilled therapy. All STGs are met at this time. She has increased her physical activity recently and she verbalized that she has been feeling more energized. Progressed hip strengthening exercises she tolerated them well. PT monitored patient throughout and provided verbal and visual cues as needed.    OBJECTIVE IMPAIRMENTS: Abnormal gait, decreased balance, decreased mobility, decreased strength, hypomobility, increased muscle spasms, impaired flexibility, improper body mechanics, postural dysfunction, and pain.   ACTIVITY LIMITATIONS: lifting, bending, standing, squatting, sleeping, stairs, transfers, and locomotion level  PARTICIPATION LIMITATIONS: meal prep, cleaning, laundry, driving, shopping, and community activity  PERSONAL FACTORS: Age, Fitness, Time since onset of injury/illness/exacerbation, and 3+ comorbidities: Osteoporosis; OA; asthma; COPD; depression; HTN;  are also affecting patient's functional outcome.   REHAB POTENTIAL: Good  CLINICAL DECISION MAKING: Evolving/moderate complexity  EVALUATION COMPLEXITY: Moderate   GOALS: Goals reviewed with patient? Yes  SHORT TERM GOALS:  Target date: 02/12/2024  Patient will be  independent with initial HEP. Baseline:  Goal status: MET 02/28/2024  2.  Patient will demonstrate improved upright posture and spinal alignment during standing and sitting task to reduce mechanical strain.  Baseline:  Goal status:MET 02/28/2024  3.   Patient will be able to participate in 3/6 minute walk test to establish a baseline measurement. Baseline:  Goal status: GOAL MET on 02/22/24 Pt stated walking 10 min prior to attending session  4.  Patient will perform 5STS in < or = to 20 sec due to improved LE strength and functional mobility to maintain independence. Baseline: 24.91 sec Goal status: MET 02/28/2024   LONG TERM GOALS: Target date: 03/11/2024  Patient will demonstrate independence in advanced HEP. Baseline:  Goal status: INITIAL  2.  Patient will report > or = to 50% improvement in back pain since starting PT. Baseline:  Goal status: Progressing   3.  Patient demonstrate correct lifting technique to prevent spontaneous fractures and mechanical strain. Baseline:  Goal status: Progressing   4.  Patient will score < or = to 26/50 on ODI due to decreased self perceived disability. Baseline: 13/50 26% Goal status: IN PROGRESS 9/11  5.  Patient will perform 5STS in < or = 16 sec for improved functional mobility and independence. Baseline: 24.91 sec Goal status: MET 02/28/2024  6.  Patient will demonstrate at least 4+/5 hip strength to improve performance of bending and lifting. Baseline:  Goal status: Progressing   PLAN:  PT FREQUENCY: 1-2x/week  PT DURATION: 8 weeks  PLANNED INTERVENTIONS: [id number]- PT Re-evaluation, 97110-Therapeutic exercises, 97530- Therapeutic activity, 97112- Neuromuscular re-education, 97535- Self Care, [id number]- Manual therapy, [phone]- Gait training, [phone]- Canalith repositioning, [id number]- Aquatic Therapy, [phone]- Electrical stimulation (unattended), [phone]- Electrical stimulation (manual), 20560 (1-2 muscles), 20561 (3+ muscles)- Dry Needling,  Patient/Family education, Balance training, Stair training, Taping, Joint mobilization, Joint manipulation, Vestibular training, Cryotherapy, and Moist heat  PLAN FOR NEXT SESSION: functional strengthening; posture when standing, core stability, balance   Kristeen Sar, PT 02/28/24 4:05 PM [facility] [address] [place], [place] [address] Phone # [phone] Fax [phone]

## 2024-03-03 ENCOUNTER — Ambulatory Visit: Admitting: Physical Therapy

## 2024-03-03 ENCOUNTER — Encounter: Payer: Self-pay | Admitting: Physical Therapy

## 2024-03-03 DIAGNOSIS — R293 Abnormal posture: Secondary | ICD-10-CM

## 2024-03-03 DIAGNOSIS — M6281 Muscle weakness (generalized): Secondary | ICD-10-CM

## 2024-03-03 DIAGNOSIS — M5459 Other low back pain: Secondary | ICD-10-CM | POA: Diagnosis not present

## 2024-03-03 NOTE — Therapy (Signed)
 OUTPATIENT PHYSICAL THERAPY TREATMENT NOTE   Patient Name: [name] MRN: [id number] DOB:01-Mar-1936, 88 y.o., female Today's Date: 03/03/2024  END OF SESSION:  PT End of Session - 03/03/24 1539     Visit Number 9    Date for PT Re-Evaluation 03/11/24    Authorization Type [employer]   [employer] APPROVED 16 VISITS 01/15/2024 - 03/11/2024 #[id number]    Authorization Time Period 01/15/2024 - 03/11/2024    Authorization - Visit Number 9    Authorization - Number of Visits 16    Progress Note Due on Visit 10    PT Start Time 1446    PT Stop Time 1527    PT Time Calculation (min) 41 min    Activity Tolerance Patient tolerated treatment well    Behavior During Therapy WFL for tasks assessed/performed                 Past Medical History:  Diagnosis Date   Arthritis    Asthma    Blood clotting disorder (HCC)    Cataract    COPD (chronic obstructive pulmonary disease) (HCC)    Depression    Diverticulosis 10-2011   Colonoscopy   GERD (gastroesophageal reflux disease)    Hemorrhoids, internal    Hx of colonic polyp    Hyperlipemia    Hypertension    IBS (irritable bowel syndrome)    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   EGD   TIA (transient ischemic attack)    Past Surgical History:  Procedure Laterality Date   CESAREAN SECTION  1962   LAPAROSCOPIC CHOLECYSTECTOMY  1995   Patient Active Problem List   Diagnosis Date Noted   Asthmatic bronchitis , chronic (HCC) 12/26/2023   Age-related osteoporosis without current pathological fracture 12/13/2023   Anaphylactic syndrome 11/29/2023   Duffy-Null Associated Neutrophil Count (DNANC) 10/09/2023   Stenosing tenosynovitis 10/09/2023   CKD stage 3b, GFR 30-44 ml/min (HCC) 10/09/2023   Postmenopausal estrogen deficiency 10/09/2023   Keratoconjunctivitis sicca of both eyes not due to Sjogren's syndrome 09/11/2023   Bilateral sciatica 09/11/2023   Anemia 09/11/2023   Leucopenia 09/11/2023   Palpitations 04/28/2023    Myalgia 07/21/2019   Thrush 07/21/2019   Dyslipidemia 05/24/2018   Chest pain 05/24/2018   Abnormal EKG 05/24/2018   Pleurisy 09/13/2017   Neck pain 10/21/2014   Low back pain 10/21/2014   Essential hypertension 07/15/2014   HLD (hyperlipidemia) 03/24/2014   Headache on top of head 03/24/2014   TIA (transient ischemic attack) 03/13/2014   Stricture and stenosis of esophagus 12/13/2011   Family history of malignant neoplasm of gastrointestinal tract 11/01/2011   History of colonic polyps 11/01/2011   Allergic rhinitis 09/17/2008   Intrinsic asthma 09/17/2008   Laryngopharyngeal reflux disease 09/17/2008   CHOLECYSTECTOMY, HX OF 09/17/2008    PCP: Sebastian Beverley NOVAK, MD  REFERRING PROVIDER: Sebastian Beverley NOVAK, MD  REFERRING DIAG:  Diagnosis  M81.0 (ICD-10-CM) - Age-related osteoporosis without current pathological fracture    THERAPY DIAG:  Other low back pain  Muscle weakness (generalized)  Abnormal posture  Rationale for Evaluation and Treatment: Rehabilitation  ONSET DATE: 12/12/2023  SUBJECTIVE:   SUBJECTIVE STATEMENT: Patient reports she is doing good today. She felt good after last treatment session.  From Eval: Patient presents with age related osteoporosis. The MD did not explain to her what is. She reports having degenerative joint disease in her back and she wears a back brace everyday. She wears a back brace when she lifts things like pots.  She sometimes has tinglinh that goes down her legs. Her back pain started when she got an epidural during child birth.  PERTINENT HISTORY: Osteoporosis; OA; asthma; COPD; depression; HTN; see DEXA results below  PAIN:  Are you having pain? Yes: NPRS scale: 0/10 Pain location: lumbar spine into buttocks Pain description: achy; sharp Aggravating factors: bending; walking ; lifting Relieving factors: medication; forward trunk bend to touch her toes  PRECAUTIONS: None  RED FLAGS: None   WEIGHT BEARING RESTRICTIONS:  No  FALLS:  Has patient fallen in last 6 months? No  LIVING ENVIRONMENT: Lives with: lives with their son Lives in: House/apartment Stairs: Yes stairs but she does not use them. 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  DIAGNOSTIC FINDINGS: DEXA 6/25 LEFT FEMORAL NECK: T-score: -1.9  LEFT TOTAL HIP: T-score: -1.3   RIGHT FEMORAL NECK: T-score: -2.4  RIGHT TOTAL HIP: T-score: -1.6  LEFT FOREARM (RADIUS 33%): T-score: -2.5   PATIENT SURVEYS:  Eval:  Modified Oswestry: 13/50 26% moderate disability  Minimally Clinically Important Difference (MCID) = 12.8%  9/11 16/50 32%  COGNITION: Overall cognitive status: Within functional limits for tasks assessed     SENSATION: Patient has occasional tingling in her legs   MUSCLE LENGTH: Hamstrings:decreased bilateral   POSTURE: rounded shoulders and forward head   LOWER EXTREMITY ROM:WFL bilateral    LOWER EXTREMITY MMT:  MMT Right eval Left eval  Hip flexion 4- 4-  Hip extension    Hip abduction 4- 4-  Hip adduction 4- 4-  Hip internal rotation    Hip external rotation    Knee flexion 4 4  Knee extension 4 4  Ankle dorsiflexion    Ankle plantarflexion    Ankle inversion    Ankle eversion     (Blank rows = not tested)   FUNCTIONAL TESTS:  Eval: 5 times sit to stand: 24.91 sec with UE support Timed up and go (TUG): 10.85 sec  9/11: 5STS: 13.69  GAIT:  Comments: decreased cadence; narrow BOS                                                                                                                                 TREATMENT DATE:  03/03/24 NuStep Level 5 - 5 mins- PT present to discuss status Hip Matrix (abduction & flexion) x 15# 2 x 12 bilateral  Single hurdle step over holding 4# DB x 12 bilateral  Standing biceps curl 4# x 12 bilateral  6 in step ups unilateral UE support at counter x 10 bilateral  Sit  to stand + chest press with 4# DB x 12 Standing shoulder row & extension with red TB x 12 Pallof press with red TB x 12 bilateral  Farmer's carry holding 4# DBs x 1 laps    02/28/24 NuStep Level 5 - 7 mins- PT present to discuss status 5STS: 13.69 ODI: 16/50 32% Hip Matrix (abduction & flexion) x 15# 2 x 12 bilateral  Seated modified deadlift 4#  x 10 Hurdles (step over pattern) unilateral 4# DB hold x 2 laps Hurdles (lateral) x 2 laps 6 in step ups holding 4# DB x 10 bilateral  Standing biceps curl 4# x 10 bilateral      02/26/24 NuStep Level 3 - 7 mins- PT present to discuss status Iso shoulder flexion with 3# DB + march 2 x 12 Sit to stand x 10 then holding 4# DB x 10 Standing row & extension with red TB  x 12 6 inch step ups x 20 bilateral  Seated modified deadlift 4#  x 10 Hurdles (step over pattern) unilateral 4# DB hold x 2 laps Hurdles (lateral) x 2 laps Sit to stand holding 4# DB x 15 Seated clam with red loop x 20 Single leg on therapad + opposite leg swings (forward,sideways, backwards) x 8 each direction bilateral  Standing biceps curl 3# x 10 bilateral    02/22/24 NuStep Level 4 - 3 mins- PT/SPT present to discuss status Seated TA activation + ball squeeze 2 x 10 Seated Adductor squeezes 2 x10 Seated modified deadlift 4#  x 10 LAQ in seated x10 each side Sit to stand x 10 then holding 4# DB x 10 Standing row with red TB  x 10 Standing shoulder abduction with red TB x 10-needed verbal cues for relaxing shoulders Tandem walking on airex pad (forwards & back wards) x 4 laps (down and back is 1  lap) Lateral side stepping on airex pad x2 laps   PATIENT EDUCATION:  Education details: Provided patient education and discussion on quality of life and stress management. Discussed motivators, challenges, and goals for future.  Person educated: Patient Education method: Explanation, Demonstration, and Handouts Education comprehension: verbalized understanding, returned demonstration, and needs further education  HOME EXERCISE PROGRAM: Access Code: [email or web address] URL: [email or web address] Date: 02/08/2024 Prepared by: Glade Pesa  Exercises - Seated Hamstring Stretch  - 1-2 x daily - 7 x weekly - 2 sets - 20-30s hold - Seated Figure 4 Piriformis Stretch  - 1-2 x daily - 7 x weekly - 2 sets - 20-30 hold - Sit to Stand with Armchair  - 1-2 x daily - 7 x weekly - 1-2 sets - 10 reps - Side Stepping with Resistance at Thighs  - 1-2 x daily - 7 x weekly - 3 sets - 10 reps - Heel Raises with Counter Support  - 1-2 x daily - 7 x weekly - 1-2 sets - 10 reps - Wall Push Up  - 1 x daily - 7 x weekly - 3 sets - 10 reps - Half Deadlift with Kettlebell  - 1 x daily - 7 x weekly - 2 sets - 5 reps   ASSESSMENT:  CLINICAL IMPRESSION: Lise  is progressing well with skilled therapy. She is highly compliant with HEP. Treatment session focused on core and functional strengthening. Patient is challenging with current intensity of exercises. She required occasional rest break due to some shortness of breath due to her asthma. PT monitored patient throughout and provided verbal and visual cues for correct performance.  OBJECTIVE IMPAIRMENTS: Abnormal gait, decreased balance, decreased mobility, decreased strength, hypomobility, increased muscle spasms, impaired flexibility, improper body mechanics, postural dysfunction, and pain.   ACTIVITY LIMITATIONS: lifting, bending, standing, squatting, sleeping, stairs, transfers, and locomotion level  PARTICIPATION LIMITATIONS: meal prep, cleaning,  laundry, driving, shopping, and community activity  PERSONAL FACTORS: Age, Fitness, Time since onset of injury/illness/exacerbation, and 3+ comorbidities: Osteoporosis; OA; asthma; COPD; depression; HTN;  are also affecting patient's functional outcome.   REHAB POTENTIAL: Good  CLINICAL DECISION MAKING: Evolving/moderate complexity  EVALUATION COMPLEXITY: Moderate   GOALS: Goals reviewed with patient? Yes  SHORT TERM GOALS: Target date: 02/12/2024  Patient will be independent with initial HEP. Baseline:  Goal status: MET 02/28/2024  2.  Patient will demonstrate improved upright posture and spinal alignment during standing and sitting task to reduce mechanical strain.  Baseline:  Goal status:MET 02/28/2024  3.   Patient will be able to participate in 3/6 minute walk test to establish a baseline measurement. Baseline:  Goal status: GOAL MET on 02/22/24 Pt stated walking 10 min prior to attending session  4.  Patient will perform 5STS in < or = to 20 sec due to improved LE strength and functional mobility to maintain independence. Baseline: 24.91 sec Goal status: MET 02/28/2024   LONG TERM GOALS: Target date: 03/11/2024  Patient will demonstrate independence in advanced HEP. Baseline:  Goal status: INITIAL  2.  Patient will report > or = to 50% improvement in back pain since starting PT. Baseline:  Goal status: Progressing   3.  Patient demonstrate correct lifting technique to prevent spontaneous fractures and mechanical strain. Baseline:  Goal status: Progressing   4.  Patient will score < or = to 26/50 on ODI due to decreased self perceived disability. Baseline: 13/50 26% Goal status: IN PROGRESS 9/11  5.  Patient will perform 5STS in < or = 16 sec for improved functional mobility and independence. Baseline: 24.91 sec Goal status: MET 02/28/2024  6.  Patient will demonstrate at least 4+/5 hip strength to improve performance of bending and lifting. Baseline:  Goal  status: Progressing   PLAN:  PT FREQUENCY: 1-2x/week  PT DURATION: 8 weeks  PLANNED INTERVENTIONS: [id number]- PT Re-evaluation, 97110-Therapeutic exercises, 97530- Therapeutic activity, 97112- Neuromuscular re-education, 97535- Self Care, [id number]- Manual therapy, [phone]- Gait training, [phone]- Canalith repositioning, [id number]- Aquatic Therapy, [phone]- Electrical stimulation (unattended), [phone]- Electrical stimulation (manual), 20560 (1-2 muscles), 20561 (3+ muscles)- Dry Needling, Patient/Family education, Balance training, Stair training, Taping, Joint mobilization, Joint manipulation, Vestibular training, Cryotherapy, and Moist heat  PLAN FOR NEXT SESSION: 10th visit PN;  functional strengthening; posture when standing, core stability, balance   Kristeen Sar, PT 03/03/24 3:42 PM [facility] [address] [place], [place] [address] Phone # [phone] Fax [phone]

## 2024-03-04 ENCOUNTER — Ambulatory Visit: Admitting: Primary Care

## 2024-03-05 ENCOUNTER — Ambulatory Visit: Admitting: Physical Therapy

## 2024-03-05 ENCOUNTER — Encounter: Payer: Self-pay | Admitting: Physical Therapy

## 2024-03-05 DIAGNOSIS — M5459 Other low back pain: Secondary | ICD-10-CM | POA: Diagnosis not present

## 2024-03-05 DIAGNOSIS — M6281 Muscle weakness (generalized): Secondary | ICD-10-CM

## 2024-03-05 DIAGNOSIS — R293 Abnormal posture: Secondary | ICD-10-CM

## 2024-03-05 NOTE — Therapy (Signed)
 OUTPATIENT PHYSICAL THERAPY TREATMENT NOTE  Progress Note Reporting Period 01/15/2024 to 03/05/2024  See note below for Objective Data and Assessment of Progress/Goals.     Patient Name: [name] MRN: [id number] DOB:Oct 23, 1935, 88 y.o., female Today's Date: 03/05/2024  END OF SESSION:  PT End of Session - 03/05/24 1539     Visit Number 10    Date for PT Re-Evaluation 03/11/24    Authorization Type [employer]   [employer] APPROVED 16 VISITS 01/15/2024 - 03/11/2024 #[id number]    Authorization Time Period 01/15/2024 - 03/11/2024    Authorization - Visit Number 10    Authorization - Number of Visits 16    Progress Note Due on Visit 20    PT Start Time 1446    PT Stop Time 1530    PT Time Calculation (min) 44 min    Activity Tolerance Patient tolerated treatment well    Behavior During Therapy WFL for tasks assessed/performed                  Past Medical History:  Diagnosis Date   Arthritis    Asthma    Blood clotting disorder (HCC)    Cataract    COPD (chronic obstructive pulmonary disease) (HCC)    Depression    Diverticulosis 10-2011   Colonoscopy   GERD (gastroesophageal reflux disease)    Hemorrhoids, internal    Hx of colonic polyp    Hyperlipemia    Hypertension    IBS (irritable bowel syndrome)    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   EGD   TIA (transient ischemic attack)    Past Surgical History:  Procedure Laterality Date   CESAREAN SECTION  1962   LAPAROSCOPIC CHOLECYSTECTOMY  1995   Patient Active Problem List   Diagnosis Date Noted   Asthmatic bronchitis , chronic (HCC) 12/26/2023   Age-related osteoporosis without current pathological fracture 12/13/2023   Anaphylactic syndrome 11/29/2023   Duffy-Null Associated Neutrophil Count (DNANC) 10/09/2023   Stenosing tenosynovitis 10/09/2023   CKD stage 3b, GFR 30-44 ml/min (HCC) 10/09/2023   Postmenopausal estrogen deficiency 10/09/2023   Keratoconjunctivitis sicca of both eyes not due to  Sjogren's syndrome 09/11/2023   Bilateral sciatica 09/11/2023   Anemia 09/11/2023   Leucopenia 09/11/2023   Palpitations 04/28/2023   Myalgia 07/21/2019   Thrush 07/21/2019   Dyslipidemia 05/24/2018   Chest pain 05/24/2018   Abnormal EKG 05/24/2018   Pleurisy 09/13/2017   Neck pain 10/21/2014   Low back pain 10/21/2014   Essential hypertension 07/15/2014   HLD (hyperlipidemia) 03/24/2014   Headache on top of head 03/24/2014   TIA (transient ischemic attack) 03/13/2014   Stricture and stenosis of esophagus 12/13/2011   Family history of malignant neoplasm of gastrointestinal tract 11/01/2011   History of colonic polyps 11/01/2011   Allergic rhinitis 09/17/2008   Intrinsic asthma 09/17/2008   Laryngopharyngeal reflux disease 09/17/2008   CHOLECYSTECTOMY, HX OF 09/17/2008    PCP: Sebastian Beverley NOVAK, MD  REFERRING PROVIDER: Sebastian Beverley NOVAK, MD  REFERRING DIAG:  Diagnosis  M81.0 (ICD-10-CM) - Age-related osteoporosis without current pathological fracture    THERAPY DIAG:  Other low back pain  Muscle weakness (generalized)  Abnormal posture  Rationale for Evaluation and Treatment: Rehabilitation  ONSET DATE: 12/12/2023  SUBJECTIVE:   SUBJECTIVE STATEMENT: Patient reports she is doing good today. She is not currently having any pain.  From Eval: Patient presents with age related osteoporosis. The MD did not explain to her what is. She reports having degenerative joint  disease in her back and she wears a back brace everyday. She wears a back brace when she lifts things like pots. She sometimes has tinglinh that goes down her legs. Her back pain started when she got an epidural during child birth.  PERTINENT HISTORY: Osteoporosis; OA; asthma; COPD; depression; HTN; see DEXA results below  PAIN:  Are you having pain? Yes: NPRS scale: 0/10 Pain location: lumbar spine into buttocks Pain description: achy; sharp Aggravating factors: bending; walking ; lifting Relieving  factors: medication; forward trunk bend to touch her toes  PRECAUTIONS: None  RED FLAGS: None   WEIGHT BEARING RESTRICTIONS: No  FALLS:  Has patient fallen in last 6 months? No  LIVING ENVIRONMENT: Lives with: lives with their son Lives in: House/apartment Stairs: Yes stairs but she does not use them. 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  DIAGNOSTIC FINDINGS: DEXA 6/25 LEFT FEMORAL NECK: T-score: -1.9  LEFT TOTAL HIP: T-score: -1.3   RIGHT FEMORAL NECK: T-score: -2.4  RIGHT TOTAL HIP: T-score: -1.6  LEFT FOREARM (RADIUS 33%): T-score: -2.5   PATIENT SURVEYS:  Eval:  Modified Oswestry: 13/50 26% moderate disability  Minimally Clinically Important Difference (MCID) = 12.8%  9/11 16/50 32%  COGNITION: Overall cognitive status: Within functional limits for tasks assessed     SENSATION: Patient has occasional tingling in her legs   MUSCLE LENGTH: Hamstrings:decreased bilateral   POSTURE: rounded shoulders and forward head   LOWER EXTREMITY ROM:WFL bilateral    LOWER EXTREMITY MMT:  MMT Right eval Left eval  Hip flexion 4- 4-  Hip extension    Hip abduction 4- 4-  Hip adduction 4- 4-  Hip internal rotation    Hip external rotation    Knee flexion 4 4  Knee extension 4 4  Ankle dorsiflexion    Ankle plantarflexion    Ankle inversion    Ankle eversion     (Blank rows = not tested)   FUNCTIONAL TESTS:  Eval: 5 times sit to stand: 24.91 sec with UE support Timed up and go (TUG): 10.85  sec  9/11: 5STS: 13.69  GAIT:  Comments: decreased cadence; narrow BOS                                                                                                                                TREATMENT DATE:  03/05/24 NuStep Level 5 - 5 mins- PT present to discuss status Seated hip abduction with green TB x 20 Hip Matrix (abduction & flexion) x 15# 2 x 10 bilateral  Seated modified deadlift  6# DB x 12 Attempted standing deadlift with 6# DB (this was challenging)  Hinging to wall (patient facing away from wall) x 5 Hinging without wall as a tactile cue x 5 Sit to stand  with # DB x 12 Seated steam engines 3# x 10 bilateral  Seated biceps curl 3# x 20 total Pallof press with red TB x 12 bilateral      03/03/24 NuStep Level 5 - 5 mins- PT present to discuss status Hip Matrix (abduction & flexion) x 15# 2 x 12 bilateral  Single hurdle step over holding 4# DB x 12 bilateral  Standing biceps curl 4# x 12 bilateral  6 in step ups unilateral UE support at counter x 10 bilateral  Sit to stand + chest press with 4# DB x 12 Standing shoulder row & extension with red TB x 12 Pallof press with red TB x 12 bilateral  Farmer's carry holding 4# DBs x 1 laps    02/28/24 NuStep Level 5 - 7 mins- PT present to discuss status 5STS: 13.69 ODI: 16/50 32% Hip Matrix (abduction & flexion) x 15# 2 x 12 bilateral  Seated modified deadlift 4#  x 10 Hurdles (step over pattern) unilateral 4# DB hold x 2 laps Hurdles (lateral) x 2 laps 6 in step ups holding 4# DB x 10 bilateral  Standing biceps curl 4# x 10 bilateral      02/26/24 NuStep Level 3 - 7 mins- PT present to discuss status Iso shoulder flexion with 3# DB + march 2 x 12 Sit to stand x 10 then holding 4# DB x 10 Standing row & extension with red TB  x 12 6 inch step ups x 20 bilateral  Seated modified deadlift 4#  x 10 Hurdles (step over pattern) unilateral 4# DB hold x 2 laps Hurdles (lateral) x 2 laps Sit to stand holding  4# DB x 15 Seated clam with red loop x 20 Single leg on therapad + opposite leg swings (forward,sideways, backwards) x 8 each direction bilateral  Standing biceps curl 3# x 10 bilateral    PATIENT EDUCATION:  Education details: Provided patient education and discussion on quality of life and stress management. Discussed motivators, challenges, and goals for future.  Person educated: Patient Education method: Explanation, Demonstration, and Handouts Education comprehension: verbalized understanding, returned demonstration, and needs further education  HOME EXERCISE PROGRAM: Access Code: [email or web address] URL: [email or web address] Date: 02/08/2024 Prepared by: Glade Pesa  Exercises - Seated Hamstring Stretch  - 1-2 x daily - 7 x weekly - 2 sets - 20-30s hold - Seated Figure 4 Piriformis Stretch  - 1-2 x daily - 7 x weekly - 2 sets - 20-30 hold - Sit to Stand with Armchair  - 1-2 x daily - 7 x weekly - 1-2 sets - 10 reps - Side Stepping with Resistance at Thighs  - 1-2 x daily - 7 x weekly - 3 sets - 10 reps - Heel Raises with Counter Support  - 1-2 x daily - 7 x weekly - 1-2 sets - 10 reps - Wall Push Up  - 1 x daily - 7 x weekly - 3 sets - 10 reps - Half Deadlift with Kettlebell  - 1 x daily - 7 x weekly - 2 sets - 5 reps   ASSESSMENT:  CLINICAL IMPRESSION: 10th visit progress note completed today. Ms. Arta  verbalized feeling 60% better since starting skilled therapy. She has initiated a regular walking program and she is compliant with current HEP. She  still verbalizes increased back pain with bending to pick up objected. Educated and demonstrated hinging technique. Patient required max verbal, visual and tactile cues for correct exercise performance. Patient is progressing well with skilled therapy and is on track to meet goal.    OBJECTIVE IMPAIRMENTS: Abnormal gait, decreased balance, decreased mobility, decreased strength, hypomobility, increased muscle spasms,  impaired flexibility, improper body mechanics, postural dysfunction, and pain.   ACTIVITY LIMITATIONS: lifting, bending, standing, squatting, sleeping, stairs, transfers, and locomotion level  PARTICIPATION LIMITATIONS: meal prep, cleaning, laundry, driving, shopping, and community activity  PERSONAL FACTORS: Age, Fitness, Time since onset of injury/illness/exacerbation, and 3+ comorbidities: Osteoporosis; OA; asthma; COPD; depression; HTN;  are also affecting patient's functional outcome.   REHAB POTENTIAL: Good  CLINICAL DECISION MAKING: Evolving/moderate complexity  EVALUATION COMPLEXITY: Moderate   GOALS: Goals reviewed with patient? Yes  SHORT TERM GOALS: Target date: 02/12/2024  Patient will be independent with initial HEP. Baseline:  Goal status: MET 02/28/2024  2.  Patient will demonstrate improved upright posture and spinal alignment during standing and sitting task to reduce mechanical strain.  Baseline:  Goal status:MET 02/28/2024  3.   Patient will be able to participate in 3/6 minute walk test to establish a baseline measurement. Baseline:  Goal status: GOAL MET on 02/22/24 Pt stated walking 10 min prior to attending session  4.  Patient will perform 5STS in < or = to 20 sec due to improved LE strength and functional mobility to maintain independence. Baseline: 24.91 sec Goal status: MET 02/28/2024   LONG TERM GOALS: Target date: 03/11/2024  Patient will demonstrate independence in advanced HEP. Baseline:  Goal status: IN PROGRESS 03/05/2024  2.  Patient will report > or = to 50% improvement in back pain since starting PT. Baseline:  Goal status: MET (60%) 03/05/2024  3.  Patient demonstrate correct lifting technique to prevent spontaneous fractures and mechanical strain. Baseline:  Goal status: Progressing   4.  Patient will score < or = to 26/50 on ODI due to decreased self perceived disability. Baseline: 13/50 26% Goal status: IN PROGRESS 03/05/2024  5.   Patient will perform 5STS in < or = 16 sec for improved functional mobility and independence. Baseline: 24.91 sec Goal status: MET 02/28/2024  6.  Patient will demonstrate at least 4+/5 hip strength to improve performance of bending and lifting. Baseline:  Goal status:IN PROGRESS 03/05/2024   PLAN:  PT FREQUENCY: 1-2x/week  PT DURATION: 8 weeks  PLANNED INTERVENTIONS: 97164- PT Re-evaluation, 97110-Therapeutic exercises, 97530- Therapeutic activity, 97112- Neuromuscular re-education, 97535- Self Care, 02859- Manual therapy, [phone]- Gait training, [phone]- Canalith repositioning, [id number]- Aquatic Therapy, [phone]- Electrical stimulation (unattended), [phone]- Electrical stimulation (manual), 20560 (1-2 muscles), 20561 (3+ muscles)- Dry Needling, Patient/Family education, Balance training, Stair training, Taping, Joint mobilization, Joint manipulation, Vestibular training, Cryotherapy, and Moist heat  PLAN FOR NEXT SESSION: recert next visit: 1x week 5 weeks (10/31) functional strengthening; posture when standing, core stability, balance  Kristeen Sar, PT 03/05/24 3:41 PM [facility] [address] [place], [place] [address] Phone # [phone] Fax [phone]

## 2024-03-06 ENCOUNTER — Ambulatory Visit: Admitting: Emergency Medicine

## 2024-03-06 ENCOUNTER — Encounter: Payer: Self-pay | Admitting: Emergency Medicine

## 2024-03-06 DIAGNOSIS — J45909 Unspecified asthma, uncomplicated: Secondary | ICD-10-CM

## 2024-03-06 DIAGNOSIS — K219 Gastro-esophageal reflux disease without esophagitis: Secondary | ICD-10-CM

## 2024-03-06 DIAGNOSIS — J4540 Moderate persistent asthma, uncomplicated: Secondary | ICD-10-CM

## 2024-03-06 DIAGNOSIS — J301 Allergic rhinitis due to pollen: Secondary | ICD-10-CM

## 2024-03-06 DIAGNOSIS — T782XXD Anaphylactic shock, unspecified, subsequent encounter: Secondary | ICD-10-CM

## 2024-03-06 MED ORDER — PANTOPRAZOLE SODIUM 40 MG PO TBEC: 40.0000 mg | DELAYED_RELEASE_TABLET | Freq: Two times a day (BID) | ORAL | 3 refills | Status: AC

## 2024-03-06 MED ORDER — MONTELUKAST SODIUM 10 MG PO TABS: 10.0000 mg | ORAL_TABLET | Freq: Every day | ORAL | 3 refills | Status: AC

## 2024-03-06 MED ORDER — COVID-19 MRNA VAC-TRIS(PFIZER) 30 MCG/0.3ML IM SUSY: 0.3000 mL | PREFILLED_SYRINGE | Freq: Once | INTRAMUSCULAR | 0 refills | Status: AC

## 2024-03-06 NOTE — Assessment & Plan Note (Signed)
 She had a long exacerbation in the July that sounds like it was in the setting of a URI.  She has now improved.  She does still have breakthrough GERD and anticipates worsening allergies this fall.  We need to control both in order to manage her asthma effectively.  Continue Symbicort  as her maintenance medication.  She has had the flu shot, needs a COVID-19 vaccine.

## 2024-03-06 NOTE — Assessment & Plan Note (Signed)
 Add back cetirizine  and montelukast  today.  Continue Flonase 

## 2024-03-06 NOTE — Progress Notes (Signed)
   Subjective:    Patient ID: Barbara  F [name], female    DOB: 1935-07-08, 88 y.o.   MRN: [id number]   ROV 10/24/2023 --follow-up visit for 88 year old woman with a history of COPD/asthma with a positive bronchodilator response, impacted by both allergic rhinitis and GERD.  She has a history of thrush with multiple inhalers.  Last seen here 04/2023. She is having more allergy and exertional SOB, has been on symbicort  80, currently using albuterol  2-3x a day. Not unlike past Spring sx.  Coughs clear mucous during the day esp in the am. Not doing loratadine  right now. She is also having some occasional breakthrough GERD on protonix  every day.    ROV 03/06/2024 --88 year old woman with a history of COPD/asthma and a positive bronchodilator response, allergies, GERD and chronic cough.  She was seen twice in early July for persistent cough, progressive dyspnea, nasal congestion and drainage, treated with antibiotics and steroids. She is on symbicort  80, albuterol  a few times a week. Her GERD has been active on protonix  every day. She is on flonase , is now off of zyrtec  and singulair .   Review of Systems Per HPI     Objective:   Physical Exam Vitals:   03/06/24 1400  BP: 128/68  Pulse: 68  SpO2: 99%  Weight: 135 lb (61.2 kg)  Height: 4' 11.25 (1.505 m)     Gen: Pleasant, elderly woman, in no distress,  normal affect  ENT: No lesions,  mouth clear, no thrush  Neck: No JVD, no stridor  Lungs: No use of accessory muscles, distant, no wheeze or crackles  Cardiovascular: RRR, heart sounds normal, no murmur or gallops, no peripheral edema  Musculoskeletal: No deformities, no cyanosis or clubbing  Neuro: alert, non focal  Skin: Warm, no lesions or rashes      Assessment & Plan:  Intrinsic asthma She had a long exacerbation in the July that sounds like it was in the setting of a URI.  She has now improved.  She does still have breakthrough GERD and anticipates worsening allergies this  fall.  We need to control both in order to manage her asthma effectively.  Continue Symbicort  as her maintenance medication.  She has had the flu shot, needs a COVID-19 vaccine.  Allergic rhinitis Add back cetirizine  and montelukast  today.  Continue Flonase   Laryngopharyngeal reflux disease Increase her Protonix  to twice daily until her follow-up visit.  Hopefully this will allow better control of her asthma into the fall months.  Can consider possibly going back to once daily when she follows up depending on how she is doing.     Lamar Chris, MD, PhD 03/06/2024, 2:26 PM [facility] [phone] or if no answer [phone]

## 2024-03-06 NOTE — Assessment & Plan Note (Signed)
 Increase her Protonix  to twice daily until her follow-up visit.  Hopefully this will allow better control of her asthma into the fall months.  Can consider possibly going back to once daily when she follows up depending on how she is doing.

## 2024-03-06 NOTE — Patient Instructions (Addendum)
 Please continue Symbicort  2 puffs twice a day.  Rinse gargle after using. Keep albuterol  available to use 2 puffs when needed for shortness of breath, chest tightness, wheezing. Flu shot up-to-date We will give you prescription to receive the COVID-19 vaccine at your pharmacy We will restart your Singulair  10 mg each evening Please restart Zyrtec  (cetirizine ) 10 mg once daily. Use your Flonase  nasal spray 2 sprays each nostril once daily Temporarily increase your Protonix  to 40 mg twice a day until your follow-up visit.  At that time we will determine whether we can decrease it back to once a day.

## 2024-03-11 ENCOUNTER — Ambulatory Visit: Admitting: Physical Therapy

## 2024-03-14 ENCOUNTER — Ambulatory Visit
Admission: EM | Admit: 2024-03-14 | Discharge: 2024-03-14 | Disposition: A | Discharge status: Home / Self Care | Attending: Family Medicine | Admitting: Family Medicine

## 2024-03-14 DIAGNOSIS — R112 Nausea with vomiting, unspecified: Secondary | ICD-10-CM

## 2024-03-14 DIAGNOSIS — R197 Diarrhea, unspecified: Secondary | ICD-10-CM | POA: Diagnosis not present

## 2024-03-14 DIAGNOSIS — R052 Subacute cough: Secondary | ICD-10-CM | POA: Diagnosis not present

## 2024-03-14 DIAGNOSIS — B349 Viral infection, unspecified: Secondary | ICD-10-CM

## 2024-03-14 LAB — POC SOFIA SARS ANTIGEN FIA: SARS Coronavirus 2 Ag: NEGATIVE

## 2024-03-14 MED ORDER — ONDANSETRON 4 MG PO TBDP: 4.0000 mg | ORAL_TABLET | Freq: Three times a day (TID) | ORAL | 0 refills | Status: DC | PRN

## 2024-03-14 MED ORDER — BENZONATATE 100 MG PO CAPS: 100.0000 mg | ORAL_CAPSULE | Freq: Three times a day (TID) | ORAL | 0 refills | Status: AC | PRN

## 2024-03-14 NOTE — ED Triage Notes (Signed)
 Pt states she had covid vaccine 9/22 to LUE-started coughing and having pain to LUE 2 days after vaccine- c/o n/v/d started this am-NAD-steady gait

## 2024-03-14 NOTE — ED Provider Notes (Signed)
 [facility]  Note:  This document was prepared using Conservation officer, historic buildings and may include unintentional dictation errors.  MRN: [id number] DOB: 1936-04-29  Subjective:   [name] is a 88 y.o. female presenting for 2 to 3-day history of malaise, coughing, started having nausea this morning.  Had an episode of vomiting and diarrhea.  No chest pain, shortness of breath or wheezing, confusion, headache, confusion.  No rashes.  Patient did receive her COVID-vaccine 03/10/2024.  Has a history of asthmatic bronchitis.  Has a history of laparoscopic cholecystectomy in 1995.  No current facility-administered medications for this encounter.  Current Outpatient Medications:    acetaminophen  (TYLENOL ) 500 MG tablet, Take 1-2 tablets (500-1,000 mg total) by mouth every 6 (six) hours as needed for headache., Disp: 30 tablet, Rfl: 0   albuterol  (PROVENTIL ) (2.5 MG/3ML) 0.083% nebulizer solution, Take 3 mLs (2.5 mg total) by nebulization every 6 (six) hours as needed for wheezing or shortness of breath., Disp: 75 mL, Rfl: 12   albuterol  (VENTOLIN  HFA) 108 (90 Base) MCG/ACT inhaler, INHALE TWO PUFFS BY MOUTH INTO LUNGS EVERY SIX HOURS AS NEEDED FOR WHEEZING AND/OR SHORTNESS OF BREATH, Disp: 8.5 g, Rfl: 2   atorvastatin  (LIPITOR) 40 MG tablet, Take 1 tablet (40 mg total) by mouth daily., Disp: 30 tablet, Rfl: 0   azelastine  (ASTELIN ) 0.1 % nasal spray, Place 2 sprays into both nostrils 2 (two) times daily., Disp: 30 mL, Rfl: 12   cetirizine  (ZYRTEC  ALLERGY) 10 MG tablet, Take 1 tablet (10 mg total) by mouth at bedtime., Disp: 90 tablet, Rfl: 3   cholecalciferol (VITAMIN D3) 25 MCG (1000 UNIT) tablet, Take 1,000 Units by mouth daily., Disp: , Rfl:    clopidogrel  (PLAVIX ) 75 MG tablet, Take 1 tablet (75 mg total) by mouth daily., Disp: 90 tablet, Rfl: 0   cycloSPORINE  (RESTASIS ) 0.05 % ophthalmic emulsion, Place 1 drop into both eyes 2 (two) times daily., Disp: ,  Rfl:    EPINEPHrine  0.3 mg/0.3 mL IJ SOAJ injection, Inject 0.3 mg into the muscle once as needed (for a severe allergic reaction/use as directed)., Disp: 1 each, Rfl: 1   fluticasone  (FLONASE ) 50 MCG/ACT nasal spray, Place 2 sprays into both nostrils 2 (two) times daily., Disp: 18.2 mL, Rfl: 11   hydrochlorothiazide  (MICROZIDE ) 12.5 MG capsule, Take 12.5 mg by mouth daily., Disp: , Rfl:    HYDROcodone  bit-homatropine (HYCODAN) 5-1.5 MG/5ML syrup, Take 5 mLs by mouth every 6 (six) hours as needed for cough., Disp: 240 mL, Rfl: 0   losartan  (COZAAR ) 50 MG tablet, Take 50 mg by mouth daily., Disp: , Rfl:    montelukast  (SINGULAIR ) 10 MG tablet, Take 1 tablet (10 mg total) by mouth at bedtime., Disp: 90 tablet, Rfl: 3   Multiple Vitamin (MULTIVITAMIN) tablet, Take 1 tablet by mouth daily. , Disp: , Rfl:    pantoprazole  (PROTONIX ) 40 MG tablet, Take 1 tablet (40 mg total) by mouth 2 (two) times daily before a meal., Disp: 180 tablet, Rfl: 3   Probiotic Product (PROBIOTIC PO), Take 1 capsule by mouth daily. , Disp: , Rfl:    Propylene Glycol (SYSTANE BALANCE) 0.6 % SOLN, Place 1 drop into both eyes 4 (four) times daily as needed (dry eyes)., Disp: , Rfl:    Respiratory Therapy Supplies (FLUTTER) DEVI, Use device 2-3 times a day to break up congestion, Disp: 1 each, Rfl: 0   rosuvastatin (CRESTOR) 10 MG tablet, Take 10 mg by mouth at bedtime., Disp: ,  Rfl:    Spacer/Aero-Holding Chambers (AEROCHAMBER MV) inhaler, Use as instructed, Disp: 1 each, Rfl: 0   SYMBICORT  80-4.5 MCG/ACT inhaler, Inhale 2 puffs into the lungs 2 (two) times daily., Disp: 30.6 g, Rfl: 3   Allergies  Allergen Reactions   Cepacol Shortness Of Breath    Difficulty breathing   Contrast Media [Iodinated Contrast Media] Shortness Of Breath and Swelling    Pt reports I could not breath at all and my face swelled up    Iodine Shortness Of Breath   Lactose Intolerance (Gi) Shortness Of Breath    All dairy  Causes shortness of breath    Mushroom Extract Complex (Obsolete) Shortness Of Breath   Other Hives, Shortness Of Breath and Swelling    Mushrooms (All mushrooms)   Penicillins Shortness Of Breath and Swelling    DID THE REACTION INVOLVE: Swelling of the face/tongue/throat, SOB, or low BP? Yes  Sudden or severe rash/hives, skin peeling, or the inside of the mouth or nose? No Did it require medical treatment? No When did it last happen? Over 10 years ago   If all above answers are "NO", may proceed with cephalosporin use.    Sulfamethoxazole Anaphylaxis   Aspirin  Other (See Comments)    Burns stomach up   Ciprofloxacin Other (See Comments)    Other reaction(s): Unknown   Budesonide  Other (See Comments)    Headache    Past Medical History:  Diagnosis Date   Arthritis    Asthma    Blood clotting disorder    Cataract    COPD (chronic obstructive pulmonary disease) (HCC)    Depression    Diverticulosis 10-2011   Colonoscopy   GERD (gastroesophageal reflux disease)    Hemorrhoids, internal    Hx of colonic polyp    Hyperlipemia    Hypertension    IBS (irritable bowel syndrome)    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   EGD   TIA (transient ischemic attack)      Past Surgical History:  Procedure Laterality Date   CESAREAN SECTION  1962   LAPAROSCOPIC CHOLECYSTECTOMY  1995    Family History  Problem Relation Age of Onset   Heart disease Mother    Hypertension Mother    Diabetes Mother    Asthma Father    Colon cancer Sister 17   Irritable bowel syndrome Sister    Breast cancer Other        Niece   Stomach cancer Neg Hx     Social History   Tobacco Use   Smoking status: Former    Current packs/day: 0.00    Average packs/day: 0.5 packs/day for 4.3 years (2.1 ttl pk-yrs)    Types: Cigarettes    Start date: 15    Quit date: 09/28/1959    Years since quitting: 64.5    Passive exposure: Never   Smokeless tobacco: Never  Vaping Use   Vaping status: Never Used  Substance Use Topics   Alcohol   use: No    Alcohol /week: 0.0 standard drinks of alcohol    Drug use: No    ROS   Objective:   Vitals: BP 124/68 (BP Location: Left Arm)   Pulse 75   Temp 99.2 F (37.3 C) (Oral)   Resp 20   SpO2 96%   Physical Exam Constitutional:      General: She is not in acute distress.    Appearance: Normal appearance. She is well-developed. She is not ill-appearing, toxic-appearing or diaphoretic.  HENT:  Head: Normocephalic and atraumatic.     Nose: Nose normal.     Mouth/Throat:     Mouth: Mucous membranes are moist.     Pharynx: Oropharynx is clear.  Eyes:     General: No scleral icterus.       Right eye: No discharge.        Left eye: No discharge.     Extraocular Movements: Extraocular movements intact.     Conjunctiva/sclera: Conjunctivae normal.  Cardiovascular:     Rate and Rhythm: Normal rate and regular rhythm.     Heart sounds: Normal heart sounds. No murmur heard.    No friction rub. No gallop.  Pulmonary:     Effort: Pulmonary effort is normal. No respiratory distress.     Breath sounds: No stridor. No wheezing, rhonchi or rales.  Chest:     Chest wall: No tenderness.  Abdominal:     General: Bowel sounds are normal. There is no distension.     Palpations: Abdomen is soft. There is no mass.     Tenderness: There is no abdominal tenderness. There is no right CVA tenderness, left CVA tenderness, guarding or rebound.  Skin:    General: Skin is warm and dry.  Neurological:     General: No focal deficit present.     Mental Status: She is alert and oriented to person, place, and time.  Psychiatric:        Mood and Affect: Mood normal.        Behavior: Behavior normal.        Thought Content: Thought content normal.        Judgment: Judgment normal.     Results for orders placed or performed during the hospital encounter of 03/14/24 (from the past 24 hours)  POC SARS Coronavirus 2 Ag     Status: None   Collection Time: 03/14/24 10:41 AM  Result Value Ref  Range   SARS Coronavirus 2 Ag Negative Negative    Assessment and Plan :   PDMP not reviewed this encounter.  1. Acute viral syndrome   2. Subacute cough   3. Nausea vomiting and diarrhea    Patient is likely having symptoms from the COVID-vaccine.  COVID testing is negative.  Ultimately can also manage with supportive care for an acute viral syndrome.  No signs of an acute abdomen, acute cardiopulmonary event.  Counseled patient on potential for adverse effects with medications prescribed/recommended today, ER and return-to-clinic precautions discussed, patient verbalized understanding.    Christopher Savannah, PA-C 03/14/24 1051

## 2024-03-18 ENCOUNTER — Encounter: Payer: Self-pay | Admitting: Rehabilitative and Restorative Service Providers"

## 2024-03-18 ENCOUNTER — Ambulatory Visit: Admitting: Rehabilitative and Restorative Service Providers"

## 2024-03-18 DIAGNOSIS — M6281 Muscle weakness (generalized): Secondary | ICD-10-CM

## 2024-03-18 DIAGNOSIS — R2689 Other abnormalities of gait and mobility: Secondary | ICD-10-CM

## 2024-03-18 DIAGNOSIS — M5459 Other low back pain: Secondary | ICD-10-CM

## 2024-03-18 DIAGNOSIS — R293 Abnormal posture: Secondary | ICD-10-CM

## 2024-03-18 NOTE — Therapy (Signed)
 OUTPATIENT PHYSICAL THERAPY TREATMENT/REASSESSMENT NOTE    Patient Name: [name] MRN: [id number] DOB:August 14, 1935, 88 y.o., female Today's Date: 03/18/2024  END OF SESSION:  PT End of Session - 03/18/24 1451     Visit Number 11    Date for Recertification  05/09/24    Authorization Type [employer] Medicare   auth requested on 03/18/24    Progress Note Due on Visit 20    PT Start Time 1446    PT Stop Time 1525    PT Time Calculation (min) 39 min    Activity Tolerance Patient tolerated treatment well    Behavior During Therapy WFL for tasks assessed/performed                  Past Medical History:  Diagnosis Date   Arthritis    Asthma    Blood clotting disorder    Cataract    COPD (chronic obstructive pulmonary disease) (HCC)    Depression    Diverticulosis 10-2011   Colonoscopy   GERD (gastroesophageal reflux disease)    Hemorrhoids, internal    Hx of colonic polyp    Hyperlipemia    Hypertension    IBS (irritable bowel syndrome)    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   EGD   TIA (transient ischemic attack)    Past Surgical History:  Procedure Laterality Date   CESAREAN SECTION  1962   LAPAROSCOPIC CHOLECYSTECTOMY  1995   Patient Active Problem List   Diagnosis Date Noted   Asthmatic bronchitis , chronic (HCC) 12/26/2023   Age-related osteoporosis without current pathological fracture 12/13/2023   Anaphylactic syndrome 11/29/2023   Duffy-Null Associated Neutrophil Count (DNANC) 10/09/2023   Stenosing tenosynovitis 10/09/2023   CKD stage 3b, GFR 30-44 ml/min (HCC) 10/09/2023   Postmenopausal estrogen deficiency 10/09/2023   Keratoconjunctivitis sicca of both eyes not due to Sjogren's syndrome 09/11/2023   Bilateral sciatica 09/11/2023   Anemia 09/11/2023   Leucopenia 09/11/2023   Palpitations 04/28/2023   Myalgia 07/21/2019   Thrush 07/21/2019   Dyslipidemia 05/24/2018   Chest pain 05/24/2018   Abnormal EKG 05/24/2018   Pleurisy 09/13/2017   Neck  pain 10/21/2014   Low back pain 10/21/2014   Essential hypertension 07/15/2014   HLD (hyperlipidemia) 03/24/2014   Headache on top of head 03/24/2014   TIA (transient ischemic attack) 03/13/2014   Stricture and stenosis of esophagus 12/13/2011   Family history of malignant neoplasm of gastrointestinal tract 11/01/2011   History of colonic polyps 11/01/2011   Allergic rhinitis 09/17/2008   Intrinsic asthma 09/17/2008   Laryngopharyngeal reflux disease 09/17/2008   CHOLECYSTECTOMY, HX OF 09/17/2008    PCP: Sebastian Beverley NOVAK, MD  REFERRING PROVIDER: Sebastian Beverley NOVAK, MD  REFERRING DIAG:  Diagnosis  M81.0 (ICD-10-CM) - Age-related osteoporosis without current pathological fracture    THERAPY DIAG:  Other low back pain  Muscle weakness (generalized)  Abnormal posture  Other abnormalities of gait and mobility  Rationale for Evaluation and Treatment: Rehabilitation  ONSET DATE: 12/12/2023  SUBJECTIVE:   SUBJECTIVE STATEMENT: Pt reports that she missed some visits due to being sick, but states that she is starting to feel better.  States that she occasionally has back pain.  States that she is still having difficulty with stairs and has back pain when she tries to lift heavy objects.  From Eval: Patient presents with age related osteoporosis. The MD did not explain to her what is. She reports having degenerative joint disease in her back and she wears a back  brace everyday. She wears a back brace when she lifts things like pots. She sometimes has tinglinh that goes down her legs. Her back pain started when she got an epidural during child birth.  PERTINENT HISTORY: Osteoporosis; OA; asthma; COPD; depression; HTN; see DEXA results below  PAIN:  Are you having pain? Yes: NPRS scale: 0-5/10 Pain location: lumbar spine into buttocks Pain description: achy; sharp Aggravating factors: bending; walking ; lifting Relieving factors: medication; forward trunk bend to touch her  toes  PRECAUTIONS: None  RED FLAGS: None   WEIGHT BEARING RESTRICTIONS: No  FALLS:  Has patient fallen in last 6 months? No  LIVING ENVIRONMENT: Lives with: lives with their son Lives in: House/apartment Stairs: Yes stairs but she does not use them. 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  DIAGNOSTIC FINDINGS: DEXA 6/25 LEFT FEMORAL NECK: T-score: -1.9  LEFT TOTAL HIP: T-score: -1.3   RIGHT FEMORAL NECK: T-score: -2.4  RIGHT TOTAL HIP: T-score: -1.6  LEFT FOREARM (RADIUS 33%): T-score: -2.5   PATIENT SURVEYS:  Eval:  Modified Oswestry: 13/50 26% moderate disability  9/11 16/50 32%  03/18/2024:  Modified Oswestry Low Back Pain Disability Questionnaire: 22 / 50 = 44.0 %  Minimally Clinically Important Difference (MCID) = 12.8%    COGNITION: Overall cognitive status: Within functional limits for tasks assessed     SENSATION: Patient has occasional tingling in her legs   MUSCLE LENGTH: Hamstrings:decreased bilateral   POSTURE: rounded shoulders and forward head   LOWER EXTREMITY ROM:WFL bilateral    LOWER EXTREMITY MMT:  MMT Right eval Left eval  Hip flexion 4- 4-  Hip extension    Hip abduction 4- 4-  Hip adduction 4- 4-  Hip internal rotation    Hip external rotation    Knee flexion 4 4  Knee extension 4 4  Ankle dorsiflexion    Ankle plantarflexion    Ankle inversion    Ankle eversion     (Blank rows = not tested)   FUNCTIONAL TESTS:  Eval: 5 times sit to stand: 24.91 sec with UE  support Timed up and go (TUG): 10.85 sec  9/11:  5STS: 13.69  03/18/2024: 5 times sit to stand: 13.84 sec 6 minute walk test:  1,066 ft with RPE of 6/10 and one standing recovery period (age related norms are 1,155 ft)  GAIT:  Comments: decreased cadence; narrow BOS  TREATMENT DATE:   03/18/2024: NuStep Level 3 x4 mins- PT present to discuss status 5 times sit to stand and modified oswestry 6 minute walk test:  1,066 ft with RPE of 6/10 and one standing recovery period Sit to/from stand holding 5# kettlebell with chest press 2x10 Seated modified deadlift with 5# kettlebell 2x10 Standing shoulder horizontal abduction with red tband 2x10 Standing shoulder ER with red tband 2x10 Seated hamstring stretch 2x20 sec bilat   03/05/24 NuStep Level 5 - 5 mins- PT present to discuss status Seated hip abduction with green TB x 20 Hip Matrix (abduction & flexion) x 15# 2 x 10 bilateral  Seated modified deadlift 6# DB x 12 Attempted standing deadlift with 6# DB (this was challenging)  Hinging to wall (patient facing away from wall) x 5 Hinging without wall as a tactile cue x 5 Sit to stand  with # DB x 12 Seated steam engines 3# x 10 bilateral  Seated biceps curl 3# x 20 total Pallof press with red TB x 12 bilateral      03/03/24 NuStep Level 5 - 5 mins- PT present to discuss status Hip Matrix (abduction & flexion) x 15# 2 x 12 bilateral  Single hurdle step over holding 4# DB x 12 bilateral  Standing biceps curl 4# x 12 bilateral  6 in step ups unilateral UE support at counter x 10 bilateral  Sit to stand + chest press with 4# DB x 12 Standing shoulder row & extension with red TB x 12 Pallof press with red TB x 12 bilateral  Farmer's carry holding 4# DBs x 1 laps    PATIENT EDUCATION:  Education details: Provided patient education and discussion on  quality of life and stress management. Discussed motivators, challenges, and goals for future.  Person educated: Patient Education method: Explanation, Demonstration, and Handouts Education comprehension: verbalized understanding, returned demonstration, and needs further education  HOME EXERCISE PROGRAM: Access Code: [email or web address] URL: [email or web address] Date: 02/08/2024 Prepared by: Glade Pesa  Exercises - Seated Hamstring Stretch  - 1-2 x daily - 7 x weekly - 2 sets - 20-30s hold - Seated Figure 4 Piriformis Stretch  - 1-2 x daily - 7 x weekly - 2 sets - 20-30 hold - Sit to Stand with Armchair  - 1-2 x daily - 7 x weekly - 1-2 sets - 10 reps - Side Stepping with Resistance at Thighs  - 1-2 x daily - 7 x weekly - 3 sets - 10 reps - Heel Raises with Counter Support  - 1-2 x daily - 7 x weekly - 1-2 sets - 10 reps - Wall Push Up  - 1 x daily - 7 x weekly - 3 sets - 10 reps - Half Deadlift with Kettlebell  - 1 x daily - 7 x weekly - 2 sets - 5 reps   ASSESSMENT:  CLINICAL IMPRESSION:  Ms Hickam presents to skilled PT reporting that she has been sick, but is starting to feel better.  Patient was able to participate in a 6 minute walk test today, but she required one standing recovery period and had a RPE of 6/10 upon completion.  Patient reported that she had increased numbness during ambulation when she initially started walking too quickly.  Patient with similar score on her 5 times sit to/from stand as last session.  Patient reports that she is still having difficulty with negotiation of stairs and she is still having pain with lifting heavy objects.  Patient would benefit from continued  skilled PT of 1x/week for 8 additional weeks to progress towards goal related activities and improved safety and functional strength.   OBJECTIVE IMPAIRMENTS: Abnormal gait, decreased balance, decreased mobility, decreased strength, hypomobility, increased muscle spasms, impaired  flexibility, improper body mechanics, postural dysfunction, and pain.   ACTIVITY LIMITATIONS: lifting, bending, standing, squatting, sleeping, stairs, transfers, and locomotion level  PARTICIPATION LIMITATIONS: meal prep, cleaning, laundry, driving, shopping, and community activity  PERSONAL FACTORS: Age, Fitness, Time since onset of injury/illness/exacerbation, and 3+ comorbidities: Osteoporosis; OA; asthma; COPD; depression; HTN;  are also affecting patient's functional outcome.   REHAB POTENTIAL: Good  CLINICAL DECISION MAKING: Evolving/moderate complexity  EVALUATION COMPLEXITY: Moderate   GOALS: Goals reviewed with patient? Yes  SHORT TERM GOALS: Target date: 02/12/2024  Patient will be independent with initial HEP. Baseline:  Goal status: MET 02/28/2024  2.  Patient will demonstrate improved upright posture and spinal alignment during standing and sitting task to reduce mechanical strain.  Baseline:  Goal status:MET 02/28/2024  3.   Patient will be able to participate in 3/6 minute walk test to establish a baseline measurement. Baseline:  Goal status: GOAL MET on 02/22/24 Pt stated walking 10 min prior to attending session  4.  Patient will perform 5STS in < or = to 20 sec due to improved LE strength and functional mobility to maintain independence. Baseline: 24.91 sec Goal status: MET 02/28/2024   LONG TERM GOALS: Target date: 05/09/2024  Patient will demonstrate independence in advanced HEP. Baseline:  Goal status: IN PROGRESS   2.  Patient will report > or = to 50% improvement in back pain since starting PT. Baseline:  Goal status: MET (60%) 03/05/2024  3.  Patient demonstrate correct lifting technique to prevent spontaneous fractures and mechanical strain/increased pain. Baseline:  Goal status: Progressing  4.  Patient will improve modified Oswestry to no greater than 25% to demonstrate improvements in functional mobility. Baseline: 13/50 26% (44% on  03/18/24) Goal status: REVISED on 03/18/24  5.  Patient will perform 5STS in < or = 16 sec for improved functional mobility and independence. Baseline: 24.91 sec Goal status: MET 02/28/2024  6.  Patient will increase bilateral LE strength to [facility] to allow her to navigate stairs with reciprocal pattern without difficulty. Baseline:  Goal status: REVISED on 03/18/24  7.  Patient will be able to ambulate at least 1,155 ft in 6 minutes to allow her to be at her age related norms. Baseline:  1,066 ft with RPE of 6/10 Goal status:  NEW on 03/18/2024   PLAN:  PT FREQUENCY: 1x/week  PT DURATION: 8 weeks  PLANNED INTERVENTIONS: 97164- PT Re-evaluation, 97750- Physical Performance Testing, 97110-Therapeutic exercises, 97530- Therapeutic activity, 97112- Neuromuscular re-education, 97535- Self Care, 02859- Manual therapy, [phone]- Gait training, [phone]- Canalith repositioning, [id number]- Aquatic Therapy, [phone]- Electrical stimulation (unattended), [phone]- Electrical stimulation (manual), [id number]- Vasopneumatic device, [id number]- Ultrasound, [id number]- Traction (mechanical), [id number]- Ionotophoresis 4mg /ml Dexamethasone, [id number] (1-2 muscles), [id number] (3+ muscles)- Dry Needling, Patient/Family education, Balance training, Stair training, Taping, Joint mobilization, Joint manipulation, Vestibular training, Cryotherapy, and Moist heat  PLAN FOR NEXT SESSION: functional strengthening; posture when standing and lifting, core stability, balance, stairs    Jarrell Laming, PT, DPT 03/18/24, 3:50 PM  [facility] [address], [address] [place], [place] [address] Phone # [phone] Fax [phone]

## 2024-03-27 ENCOUNTER — Ambulatory Visit: Attending: Family Medicine | Admitting: Physical Therapy

## 2024-03-27 DIAGNOSIS — M5459 Other low back pain: Secondary | ICD-10-CM | POA: Diagnosis present

## 2024-03-27 DIAGNOSIS — R2689 Other abnormalities of gait and mobility: Secondary | ICD-10-CM | POA: Diagnosis present

## 2024-03-27 DIAGNOSIS — R293 Abnormal posture: Secondary | ICD-10-CM | POA: Insufficient documentation

## 2024-03-27 DIAGNOSIS — M6281 Muscle weakness (generalized): Secondary | ICD-10-CM | POA: Diagnosis present

## 2024-03-27 NOTE — Therapy (Signed)
 OUTPATIENT PHYSICAL THERAPY TREATMENT/REASSESSMENT NOTE    Patient Name: [name]  MEDINA DEGRAFFENREID MRN: [id number] DOB:15-Jul-1935, 88 y.o., female Today's Date: 03/27/2024  END OF SESSION:  PT End of Session - 03/27/24 1445     Visit Number 12    Date for Recertification  05/09/24    Authorization Type [employer]   4 visits 9/30-10/28    Authorization - Visit Number 2    Authorization - Number of Visits 4    Progress Note Due on Visit 20    PT Start Time 1446    PT Stop Time 1528    PT Time Calculation (min) 42 min    Activity Tolerance Patient tolerated treatment well                  Past Medical History:  Diagnosis Date   Arthritis    Asthma    Blood clotting disorder    Cataract    COPD (chronic obstructive pulmonary disease) (HCC)    Depression    Diverticulosis 10-2011   Colonoscopy   GERD (gastroesophageal reflux disease)    Hemorrhoids, internal    Hx of colonic polyp    Hyperlipemia    Hypertension    IBS (irritable bowel syndrome)    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   EGD   TIA (transient ischemic attack)    Past Surgical History:  Procedure Laterality Date   CESAREAN SECTION  1962   LAPAROSCOPIC CHOLECYSTECTOMY  1995   Patient Active Problem List   Diagnosis Date Noted   Asthmatic bronchitis , chronic (HCC) 12/26/2023   Age-related osteoporosis without current pathological fracture 12/13/2023   Anaphylactic syndrome 11/29/2023   Duffy-Null Associated Neutrophil Count (DNANC) 10/09/2023   Stenosing tenosynovitis 10/09/2023   CKD stage 3b, GFR 30-44 ml/min (HCC) 10/09/2023   Postmenopausal estrogen deficiency 10/09/2023   Keratoconjunctivitis sicca of both eyes not due to Sjogren's syndrome 09/11/2023   Bilateral sciatica 09/11/2023   Anemia 09/11/2023   Leucopenia 09/11/2023   Palpitations 04/28/2023   Myalgia 07/21/2019   Thrush 07/21/2019   Dyslipidemia 05/24/2018   Chest pain 05/24/2018   Abnormal EKG 05/24/2018   Pleurisy 09/13/2017    Neck pain 10/21/2014   Low back pain 10/21/2014   Essential hypertension 07/15/2014   HLD (hyperlipidemia) 03/24/2014   Headache on top of head 03/24/2014   TIA (transient ischemic attack) 03/13/2014   Stricture and stenosis of esophagus 12/13/2011   Family history of malignant neoplasm of gastrointestinal tract 11/01/2011   History of colonic polyps 11/01/2011   Allergic rhinitis 09/17/2008   Intrinsic asthma 09/17/2008   Laryngopharyngeal reflux disease 09/17/2008   CHOLECYSTECTOMY, HX OF 09/17/2008    PCP: Sebastian Beverley NOVAK, MD  REFERRING PROVIDER: Sebastian Beverley NOVAK, MD  REFERRING DIAG:  Diagnosis  M81.0 (ICD-10-CM) - Age-related osteoporosis without current pathological fracture    THERAPY DIAG:  Other low back pain  Muscle weakness (generalized)  Rationale for Evaluation and Treatment: Rehabilitation  ONSET DATE: 12/12/2023  SUBJECTIVE:   SUBJECTIVE STATEMENT: My power went out on Monday and since then the hot water heater, oven, microwave and lights don't work.  Today my back is OK.  No weakness in legs today.  Sometimes I sleep wrong (fetal position) and my legs go numb.  Standing forward toe touches relieves it.  Therapy has made a huge difference.    From Eval: Patient presents with age related osteoporosis. The MD did not explain to her what is. She reports having degenerative joint disease in her  back and she wears a back brace everyday. She wears a back brace when she lifts things like pots. She sometimes has tinglinh that goes down her legs. Her back pain started when she got an epidural during child birth.  PERTINENT HISTORY: Osteoporosis; OA; asthma; COPD; depression; HTN; see DEXA results below  PAIN:  Are you having pain? Yes: NPRS scale: 0/10 Pain location: lumbar spine into buttocks Pain description: achy; sharp Aggravating factors: bending; walking ; lifting Relieving factors: medication; forward trunk bend to touch her toes  PRECAUTIONS:  None  RED FLAGS: None   WEIGHT BEARING RESTRICTIONS: No  FALLS:  Has patient fallen in last 6 months? No  LIVING ENVIRONMENT: Lives with: lives with their son Lives in: House/apartment Stairs: Yes stairs but she does not use them. 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  DIAGNOSTIC FINDINGS: DEXA 6/25 LEFT FEMORAL NECK: T-score: -1.9  LEFT TOTAL HIP: T-score: -1.3   RIGHT FEMORAL NECK: T-score: -2.4  RIGHT TOTAL HIP: T-score: -1.6  LEFT FOREARM (RADIUS 33%): T-score: -2.5   PATIENT SURVEYS:  Eval:  Modified Oswestry: 13/50 26% moderate disability  9/11 16/50 32%  03/18/2024:  Modified Oswestry Low Back Pain Disability Questionnaire: 22 / 50 = 44.0 %  Minimally Clinically Important Difference (MCID) = 12.8%    COGNITION: Overall cognitive status: Within functional limits for tasks assessed     SENSATION: Patient has occasional tingling in her legs   MUSCLE LENGTH: Hamstrings:decreased bilateral   POSTURE: rounded shoulders and forward head   LOWER EXTREMITY ROM:WFL bilateral    LOWER EXTREMITY MMT:  MMT Right eval Left eval  Hip flexion 4- 4-  Hip extension    Hip abduction 4- 4-  Hip adduction 4- 4-  Hip internal rotation    Hip external rotation    Knee flexion 4 4  Knee extension 4 4  Ankle dorsiflexion    Ankle plantarflexion    Ankle inversion    Ankle eversion     (Blank rows = not tested)   FUNCTIONAL TESTS:  Eval: 5 times sit to stand: 24.91 sec with UE support Timed up  and go (TUG): 10.85 sec  9/11:  5STS: 13.69  03/18/2024: 5 times sit to stand: 13.84 sec 6 minute walk test:  1,066 ft with RPE of 6/10 and one standing recovery period (age related norms are 1,155 ft)  GAIT:  Comments: decreased cadence; narrow BOS  TREATMENT DATE:  03/27/2024: NuStep Level 5 x6 mins- PT present to discuss status Seated 5# kettlebell core series 7x each: hip to hip, shoulder to hip,V formations Seated 5# sit ups with press overhead 8x Seated deadlift with green band anchored under feet 7x Sit to/from stand holding 5# kettlebell x12 Standing shoulder rows with green band 2x10 (Added to HEP- see below)  Standing shoulder extensions with green band 10x (Added to HEP- see below) Pt given green band with handles for home use   03/18/2024: NuStep Level 3 x4 mins- PT present to discuss status 5 times sit to stand and modified oswestry 6 minute walk test:  1,066 ft with RPE of 6/10 and one standing recovery period Sit to/from stand holding 5# kettlebell with chest press 2x10 Seated modified deadlift with 5# kettlebell 2x10 Standing shoulder horizontal abduction with red tband 2x10 Standing shoulder ER with red tband 2x10 Seated hamstring stretch 2x20 sec bilat   03/05/24 NuStep Level 5 - 5 mins- PT present to discuss status Seated hip abduction with green TB x 20 Hip Matrix (abduction & flexion) x 15# 2 x 10 bilateral  Seated modified deadlift 6# DB x 12 Attempted standing deadlift with 6# DB (this was challenging)  Hinging to wall (patient facing away from wall) x 5 Hinging without wall as a tactile cue x 5 Sit to stand  with # DB x 12 Seated steam engines 3# x 10 bilateral  Seated biceps curl 3# x 20 total Pallof press with red TB x 12 bilateral      03/03/24 NuStep Level 5 - 5 mins- PT present to discuss status Hip Matrix  (abduction & flexion) x 15# 2 x 12 bilateral  Single hurdle step over holding 4# DB x 12 bilateral  Standing biceps curl 4# x 12 bilateral  6 in step ups unilateral UE support at counter x 10 bilateral  Sit to stand + chest press with 4# DB x 12 Standing shoulder row & extension with red TB x 12 Pallof press with red TB x 12 bilateral  Farmer's carry holding 4# DBs x 1 laps    PATIENT EDUCATION:  Education details: Provided patient education and discussion on quality of life and stress management. Discussed motivators, challenges, and goals for future.  Person educated: Patient Education method: Explanation, Demonstration, and Handouts Education comprehension: verbalized understanding, returned demonstration, and needs further education  HOME EXERCISE PROGRAM: Access Code: [email or web address] URL: [email or web address] Date: 03/27/2024 Prepared by: Glade Pesa  Exercises - Seated Hamstring Stretch  - 1-2 x daily - 7 x weekly - 2 sets - 20-30s hold - Seated Figure 4 Piriformis Stretch  - 1-2 x daily - 7 x weekly - 2 sets - 20-30 hold - Sit to Stand with Armchair  - 1-2 x daily - 7 x weekly - 1-2 sets - 10 reps - Side Stepping with Resistance at Thighs  - 1-2 x daily - 7 x weekly - 3 sets - 10 reps - Heel Raises with Counter Support  - 1-2 x daily - 7 x weekly - 1-2 sets - 10 reps - Wall Push Up  - 1 x daily - 7 x weekly - 3 sets - 10 reps - Half Deadlift with Kettlebell  - 1 x daily - 7 x weekly - 2 sets - 5 reps - Standing Row with Anchored Resistance  - 1 x daily - 7 x weekly - 2 sets - 10 reps - Shoulder extension with resistance - Neutral  -  1 x daily - 7 x weekly - 2 sets - 10 reps   ASSESSMENT:  CLINICAL IMPRESSION: Therapist providing verbal cues to optimize technique with  exercises in order to achieve the greatest benefit. Therapist progressing and updating HEP for increased intensity and challenge level for further strengthening and functional mobility.   She  responds well to exercise and back pain remains very low or with no pain at all throughout the session.      OBJECTIVE IMPAIRMENTS: Abnormal gait, decreased balance, decreased mobility, decreased strength, hypomobility, increased muscle spasms, impaired flexibility, improper body mechanics, postural dysfunction, and pain.   ACTIVITY LIMITATIONS: lifting, bending, standing, squatting, sleeping, stairs, transfers, and locomotion level  PARTICIPATION LIMITATIONS: meal prep, cleaning, laundry, driving, shopping, and community activity  PERSONAL FACTORS: Age, Fitness, Time since onset of injury/illness/exacerbation, and 3+ comorbidities: Osteoporosis; OA; asthma; COPD; depression; HTN;  are also affecting patient's functional outcome.   REHAB POTENTIAL: Good  CLINICAL DECISION MAKING: Evolving/moderate complexity  EVALUATION COMPLEXITY: Moderate   GOALS: Goals reviewed with patient? Yes  SHORT TERM GOALS: Target date: 02/12/2024  Patient will be independent with initial HEP. Baseline:  Goal status: MET 02/28/2024  2.  Patient will demonstrate improved upright posture and spinal alignment during standing and sitting task to reduce mechanical strain.  Baseline:  Goal status:MET 02/28/2024  3.   Patient will be able to participate in 3/6 minute walk test to establish a baseline measurement. Baseline:  Goal status: GOAL MET on 02/22/24 Pt stated walking 10 min prior to attending session  4.  Patient will perform 5STS in < or = to 20 sec due to improved LE strength and functional mobility to maintain independence. Baseline: 24.91 sec Goal status: MET 02/28/2024   LONG TERM GOALS: Target date: 05/09/2024  Patient will demonstrate independence in advanced HEP. Baseline:  Goal status: IN PROGRESS   2.  Patient will report > or = to 50% improvement in back pain since starting PT. Baseline:  Goal status: MET (60%) 03/05/2024  3.  Patient demonstrate correct lifting technique to prevent  spontaneous fractures and mechanical strain/increased pain. Baseline:  Goal status: Progressing  4.  Patient will improve modified Oswestry to no greater than 25% to demonstrate improvements in functional mobility. Baseline: 13/50 26% (44% on 03/18/24) Goal status: REVISED on 03/18/24  5.  Patient will perform 5STS in < or = 16 sec for improved functional mobility and independence. Baseline: 24.91 sec Goal status: MET 02/28/2024  6.  Patient will increase bilateral LE strength to [facility] to allow her to navigate stairs with reciprocal pattern without difficulty. Baseline:  Goal status: REVISED on 03/18/24  7.  Patient will be able to ambulate at least 1,155 ft in 6 minutes to allow her to be at her age related norms. Baseline:  1,066 ft with RPE of 6/10 Goal status:  NEW on 03/18/2024   PLAN:  PT FREQUENCY: 1x/week  PT DURATION: 8 weeks  PLANNED INTERVENTIONS: 97164- PT Re-evaluation, 97750- Physical Performance Testing, 97110-Therapeutic exercises, 97530- Therapeutic activity, 97112- Neuromuscular re-education, 97535- Self Care, [id number]- Manual therapy, [id number]- Gait training, [phone]- Canalith repositioning, [id number]- Aquatic Therapy, [phone]- Electrical stimulation (unattended), [phone]- Electrical stimulation (manual), [id number]- Vasopneumatic device, [id number]- Ultrasound, [id number]- Traction (mechanical), [id number]- Ionotophoresis 4mg /ml Dexamethasone, [id number] (1-2 muscles), [id number] (3+ muscles)- Dry Needling, Patient/Family education, Balance training, Stair training, Taping, Joint mobilization, Joint manipulation, Vestibular training, Cryotherapy, and Moist heat  PLAN FOR NEXT SESSION: 2 more visits approved; finalize HEP using green band and 5# weight/kettleball; functional strengthening;  posture when standing and lifting, core stability, balance, stairs   Glade Pesa, PT 03/27/24 3:25 PM Phone: [phone] Fax: [phone] [address] [place], [place]  [address] Phone # [phone] Fax [phone]

## 2024-04-01 ENCOUNTER — Ambulatory Visit: Admitting: Neurology

## 2024-04-03 ENCOUNTER — Ambulatory Visit: Admitting: Physical Therapy

## 2024-04-03 ENCOUNTER — Encounter: Payer: Self-pay | Admitting: Physical Therapy

## 2024-04-03 DIAGNOSIS — R2689 Other abnormalities of gait and mobility: Secondary | ICD-10-CM

## 2024-04-03 DIAGNOSIS — M5459 Other low back pain: Secondary | ICD-10-CM | POA: Diagnosis not present

## 2024-04-03 DIAGNOSIS — M6281 Muscle weakness (generalized): Secondary | ICD-10-CM

## 2024-04-03 DIAGNOSIS — R293 Abnormal posture: Secondary | ICD-10-CM

## 2024-04-03 NOTE — Therapy (Signed)
 OUTPATIENT PHYSICAL THERAPY TREATMENT    Patient Name: [name] MRN: [id number] DOB:10-21-1935, 88 y.o., female Today's Date: 04/03/2024  END OF SESSION:  PT End of Session - 04/03/24 1537     Visit Number 13    Date for Recertification  05/09/24    Authorization Type [employer]   4 visits 9/30-10/28    Authorization Time Period 9/30-10/28    Authorization - Visit Number 3    Authorization - Number of Visits 4    Progress Note Due on Visit 20    PT Start Time 1450    PT Stop Time 1530    PT Time Calculation (min) 40 min    Activity Tolerance Patient tolerated treatment well    Behavior During Therapy WFL for tasks assessed/performed                   Past Medical History:  Diagnosis Date   Arthritis    Asthma    Blood clotting disorder    Cataract    COPD (chronic obstructive pulmonary disease) (HCC)    Depression    Diverticulosis 10-2011   Colonoscopy   GERD (gastroesophageal reflux disease)    Hemorrhoids, internal    Hx of colonic polyp    Hyperlipemia    Hypertension    IBS (irritable bowel syndrome)    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   EGD   TIA (transient ischemic attack)    Past Surgical History:  Procedure Laterality Date   CESAREAN SECTION  1962   LAPAROSCOPIC CHOLECYSTECTOMY  1995   Patient Active Problem List   Diagnosis Date Noted   Asthmatic bronchitis , chronic (HCC) 12/26/2023   Age-related osteoporosis without current pathological fracture 12/13/2023   Anaphylactic syndrome 11/29/2023   Duffy-Null Associated Neutrophil Count (DNANC) 10/09/2023   Stenosing tenosynovitis 10/09/2023   CKD stage 3b, GFR 30-44 ml/min (HCC) 10/09/2023   Postmenopausal estrogen deficiency 10/09/2023   Keratoconjunctivitis sicca of both eyes not due to Sjogren's syndrome 09/11/2023   Bilateral sciatica 09/11/2023   Anemia 09/11/2023   Leucopenia 09/11/2023   Palpitations 04/28/2023   Myalgia 07/21/2019   Thrush 07/21/2019   Dyslipidemia  05/24/2018   Chest pain 05/24/2018   Abnormal EKG 05/24/2018   Pleurisy 09/13/2017   Neck pain 10/21/2014   Low back pain 10/21/2014   Essential hypertension 07/15/2014   HLD (hyperlipidemia) 03/24/2014   Headache on top of head 03/24/2014   TIA (transient ischemic attack) 03/13/2014   Stricture and stenosis of esophagus 12/13/2011   Family history of malignant neoplasm of gastrointestinal tract 11/01/2011   History of colonic polyps 11/01/2011   Allergic rhinitis 09/17/2008   Intrinsic asthma 09/17/2008   Laryngopharyngeal reflux disease 09/17/2008   CHOLECYSTECTOMY, HX OF 09/17/2008    PCP: Sebastian Beverley NOVAK, MD  REFERRING PROVIDER: Sebastian Beverley NOVAK, MD  REFERRING DIAG:  Diagnosis  M81.0 (ICD-10-CM) - Age-related osteoporosis without current pathological fracture    THERAPY DIAG:  Other low back pain  Muscle weakness (generalized)  Abnormal posture  Other abnormalities of gait and mobility  Rationale for Evaluation and Treatment: Rehabilitation  ONSET DATE: 12/12/2023  SUBJECTIVE:   SUBJECTIVE STATEMENT: Patient reports she is doing good today. She is not currently having any pain.   From Eval: Patient presents with age related osteoporosis. The MD did not explain to her what is. She reports having degenerative joint disease in her back and she wears a back brace everyday. She wears a back brace when she lifts things  like pots. She sometimes has tinglinh that goes down her legs. Her back pain started when she got an epidural during child birth.  PERTINENT HISTORY: Osteoporosis; OA; asthma; COPD; depression; HTN; see DEXA results below  PAIN:  Are you having pain? Yes: NPRS scale: 0/10 Pain location: lumbar spine into buttocks Pain description: achy; sharp Aggravating factors: bending; walking ; lifting Relieving factors: medication; forward trunk bend to touch her toes  PRECAUTIONS: None  RED FLAGS: None   WEIGHT BEARING RESTRICTIONS: No  FALLS:   Has patient fallen in last 6 months? No  LIVING ENVIRONMENT: Lives with: lives with their son Lives in: House/apartment Stairs: Yes stairs but she does not use them. 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  DIAGNOSTIC FINDINGS: DEXA 6/25 LEFT FEMORAL NECK: T-score: -1.9  LEFT TOTAL HIP: T-score: -1.3   RIGHT FEMORAL NECK: T-score: -2.4  RIGHT TOTAL HIP: T-score: -1.6  LEFT FOREARM (RADIUS 33%): T-score: -2.5   PATIENT SURVEYS:  Eval:  Modified Oswestry: 13/50 26% moderate disability  9/11 16/50 32%  03/18/2024:  Modified Oswestry Low Back Pain Disability Questionnaire: 22 / 50 = 44.0 %  Minimally Clinically Important Difference (MCID) = 12.8%    COGNITION: Overall cognitive status: Within functional limits for tasks assessed     SENSATION: Patient has occasional tingling in her legs   MUSCLE LENGTH: Hamstrings:decreased bilateral   POSTURE: rounded shoulders and forward head   LOWER EXTREMITY ROM:WFL bilateral    LOWER EXTREMITY MMT:  MMT Right eval Left eval  Hip flexion 4- 4-  Hip extension    Hip abduction 4- 4-  Hip adduction 4- 4-  Hip internal rotation    Hip external rotation    Knee flexion 4 4  Knee extension 4 4  Ankle dorsiflexion    Ankle plantarflexion    Ankle inversion    Ankle eversion     (Blank rows = not tested)   FUNCTIONAL TESTS:  Eval: 5 times sit to stand: 24.91 sec with UE support Timed up and go (TUG): 10.85 sec  9/11:  5STS: 13.69  03/18/2024: 5 times sit  to stand: 13.84 sec 6 minute walk test:  1,066 ft with RPE of 6/10 and one standing recovery period (age related norms are 1,155 ft)  GAIT:  Comments: decreased cadence; narrow BOS                                                                                                                                TREATMENT DATE:  04/03/2024: NuStep Level 5 x7  mins- PT present to discuss status Seated chest press 6# DB x 10 (very challenging) Seated 5# DB ear to hip x 10 bilateral  Standing row with green TB x 12 Standing extension with green TB x 12 Seated shoulder abduction with green TB x 12 Seated deadlift with green band anchored under feet x  10 Hip Matrix (abduction & flexion) x 15# 2 x 10 bilateral  Standing pallof press with pink long band x 12 each direction 6 inch step ups x 12 bilateral in // bars    03/27/2024: NuStep Level 5 x6 mins- PT present to discuss status Seated 5# kettlebell core series 7x each: hip to hip, shoulder to hip,V formations Seated 5# sit ups with press overhead 8x Seated deadlift with green band anchored under feet 7x Sit to/from stand holding 5# kettlebell x12 Standing shoulder rows with green band 2x10 (Added to HEP- see below)  Standing shoulder extensions with green band 10x (Added to HEP- see below) Pt given green band with handles for home use   03/18/2024: NuStep Level 3 x4 mins- PT present to discuss status 5 times sit to stand and modified oswestry 6 minute walk test:  1,066 ft with RPE of 6/10 and one standing recovery period Sit to/from stand holding 5# kettlebell with chest press 2x10 Seated modified deadlift with 5# kettlebell 2x10 Standing shoulder horizontal abduction with red tband 2x10 Standing shoulder ER with red tband 2x10 Seated hamstring stretch 2x20 sec bilat     PATIENT EDUCATION:  Education details: Provided patient education and discussion on quality of life and stress management. Discussed motivators, challenges, and  goals for future.  Person educated: Patient Education method: Explanation, Demonstration, and Handouts Education comprehension: verbalized understanding, returned demonstration, and needs further education  HOME EXERCISE PROGRAM: Access Code: [email or web address] URL: [email or web address] Date: 03/27/2024 Prepared by: Glade Pesa  Exercises - Seated Hamstring Stretch  - 1-2 x daily - 7 x weekly - 2 sets - 20-30s hold - Seated Figure 4 Piriformis Stretch  - 1-2 x daily - 7 x weekly - 2 sets - 20-30 hold - Sit to Stand with Armchair  - 1-2 x daily - 7 x weekly - 1-2 sets - 10 reps - Side Stepping with Resistance at Thighs  - 1-2 x daily - 7 x weekly - 3 sets - 10 reps - Heel Raises with Counter Support  - 1-2 x daily - 7 x weekly - 1-2 sets - 10 reps - Wall Push Up  - 1 x daily - 7 x weekly - 3 sets - 10 reps - Half Deadlift with Kettlebell  - 1 x daily - 7 x weekly - 2 sets - 5 reps - Standing Row with Anchored Resistance  - 1 x daily - 7 x weekly - 2 sets - 10 reps - Shoulder extension with resistance - Neutral  - 1 x daily - 7 x weekly - 2 sets - 10 reps   ASSESSMENT:  CLINICAL IMPRESSION: Patient presents with no back pain today. She has been walking for exercise since the weather has been so nice. Discussed visits insurance approved and POC. Patient agreed that she will be ready for discharge next treatment session. PT monitored patient throughout and provided verbal and visual cues as needed for correct exercise performance. Plan to discharge patient next treatment session.     OBJECTIVE IMPAIRMENTS: Abnormal gait, decreased balance, decreased mobility, decreased strength, hypomobility, increased muscle spasms, impaired flexibility, improper body mechanics, postural dysfunction, and pain.  ACTIVITY LIMITATIONS: lifting, bending, standing, squatting, sleeping, stairs, transfers, and locomotion level  PARTICIPATION LIMITATIONS: meal prep, cleaning, laundry, driving,  shopping, and community activity  PERSONAL FACTORS: Age, Fitness, Time since onset of injury/illness/exacerbation, and 3+ comorbidities: Osteoporosis; OA; asthma; COPD; depression; HTN;  are also affecting patient's functional outcome.   REHAB POTENTIAL: Good  CLINICAL DECISION MAKING: Evolving/moderate complexity  EVALUATION COMPLEXITY: Moderate   GOALS: Goals reviewed with patient? Yes  SHORT TERM GOALS: Target date: 02/12/2024  Patient will be independent with initial HEP. Baseline:  Goal status: MET 02/28/2024  2.  Patient will demonstrate improved upright posture and spinal alignment during standing and sitting task to reduce mechanical strain.  Baseline:  Goal status:MET 02/28/2024  3.   Patient will be able to participate in 3/6 minute walk test to establish a baseline measurement. Baseline:  Goal status: GOAL MET on 02/22/24 Pt stated walking 10 min prior to attending session  4.  Patient will perform 5STS in < or = to 20 sec due to improved LE strength and functional mobility to maintain independence. Baseline: 24.91 sec Goal status: MET 02/28/2024   LONG TERM GOALS: Target date: 05/09/2024  Patient will demonstrate independence in advanced HEP. Baseline:  Goal status: IN PROGRESS   2.  Patient will report > or = to 50% improvement in back pain since starting PT. Baseline:  Goal status: MET (60%) 03/05/2024  3.  Patient demonstrate correct lifting technique to prevent spontaneous fractures and mechanical strain/increased pain. Baseline:  Goal status: Progressing  4.  Patient will improve modified Oswestry to no greater than 25% to demonstrate improvements in functional mobility. Baseline: 13/50 26% (44% on 03/18/24) Goal status: REVISED on 03/18/24  5.  Patient will perform 5STS in < or = 16 sec for improved functional mobility and independence. Baseline: 24.91 sec Goal status: MET 02/28/2024  6.  Patient will increase bilateral LE strength to [facility] to allow her  to navigate stairs with reciprocal pattern without difficulty. Baseline:  Goal status: REVISED on 03/18/24  7.  Patient will be able to ambulate at least 1,155 ft in 6 minutes to allow her to be at her age related norms. Baseline:  1,066 ft with RPE of 6/10 Goal status:  NEW on 03/18/2024   PLAN:  PT FREQUENCY: 1x/week  PT DURATION: 8 weeks  PLANNED INTERVENTIONS: 97164- PT Re-evaluation, 97750- Physical Performance Testing, 97110-Therapeutic exercises, 97530- Therapeutic activity, 97112- Neuromuscular re-education, 97535- Self Care, [id number]- Manual therapy, [id number]- Gait training, [phone]- Canalith repositioning, [id number]- Aquatic Therapy, [phone]- Electrical stimulation (unattended), [phone]- Electrical stimulation (manual), [id number]- Vasopneumatic device, [id number]- Ultrasound, [id number]- Traction (mechanical), [id number]- Ionotophoresis 4mg /ml Dexamethasone, [id number] (1-2 muscles), [id number] (3+ muscles)- Dry Needling, Patient/Family education, Balance training, Stair training, Taping, Joint mobilization, Joint manipulation, Vestibular training, Cryotherapy, and Moist heat  PLAN FOR NEXT SESSION: Finalize HEP; Discharge patient next treatment session   Kristeen Sar, PT, DPT 04/03/24 3:38 PM [facility] [address] [place], [place] [address] Phone # [phone] Fax [phone]

## 2024-04-08 ENCOUNTER — Ambulatory Visit: Admitting: Physical Therapy

## 2024-04-08 ENCOUNTER — Encounter: Payer: Self-pay | Admitting: Physical Therapy

## 2024-04-08 DIAGNOSIS — M5459 Other low back pain: Secondary | ICD-10-CM | POA: Diagnosis not present

## 2024-04-08 DIAGNOSIS — M6281 Muscle weakness (generalized): Secondary | ICD-10-CM

## 2024-04-08 DIAGNOSIS — R293 Abnormal posture: Secondary | ICD-10-CM

## 2024-04-08 NOTE — Therapy (Signed)
 OUTPATIENT PHYSICAL THERAPY TREATMENT/DISCHARGE SUMMARY    Patient Name: [name] MRN: [id number] DOB:09-14-1935, 88 y.o., female Today's Date: 04/08/2024  END OF SESSION:  PT End of Session - 04/08/24 1447     Visit Number 14    Date for Recertification  05/09/24    Authorization Type [employer]   4 visits 9/30-10/28    Authorization Time Period 9/30-10/28    Authorization - Visit Number 4    Authorization - Number of Visits 4    Progress Note Due on Visit 20    PT Start Time 1450    PT Stop Time 1529    PT Time Calculation (min) 39 min    Activity Tolerance Patient tolerated treatment well                   Past Medical History:  Diagnosis Date   Arthritis    Asthma    Blood clotting disorder    Cataract    COPD (chronic obstructive pulmonary disease) (HCC)    Depression    Diverticulosis 10-2011   Colonoscopy   GERD (gastroesophageal reflux disease)    Hemorrhoids, internal    Hx of colonic polyp    Hyperlipemia    Hypertension    IBS (irritable bowel syndrome)    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   EGD   TIA (transient ischemic attack)    Past Surgical History:  Procedure Laterality Date   CESAREAN SECTION  1962   LAPAROSCOPIC CHOLECYSTECTOMY  1995   Patient Active Problem List   Diagnosis Date Noted   Asthmatic bronchitis , chronic (HCC) 12/26/2023   Age-related osteoporosis without current pathological fracture 12/13/2023   Anaphylactic syndrome 11/29/2023   Duffy-Null Associated Neutrophil Count (DNANC) 10/09/2023   Stenosing tenosynovitis 10/09/2023   CKD stage 3b, GFR 30-44 ml/min (HCC) 10/09/2023   Postmenopausal estrogen deficiency 10/09/2023   Keratoconjunctivitis sicca of both eyes not due to Sjogren's syndrome 09/11/2023   Bilateral sciatica 09/11/2023   Anemia 09/11/2023   Leucopenia 09/11/2023   Palpitations 04/28/2023   Myalgia 07/21/2019   Thrush 07/21/2019   Dyslipidemia 05/24/2018   Chest pain 05/24/2018    Abnormal EKG 05/24/2018   Pleurisy 09/13/2017   Neck pain 10/21/2014   Low back pain 10/21/2014   Essential hypertension 07/15/2014   HLD (hyperlipidemia) 03/24/2014   Headache on top of head 03/24/2014   TIA (transient ischemic attack) 03/13/2014   Stricture and stenosis of esophagus 12/13/2011   Family history of malignant neoplasm of gastrointestinal tract 11/01/2011   History of colonic polyps 11/01/2011   Allergic rhinitis 09/17/2008   Intrinsic asthma 09/17/2008   Laryngopharyngeal reflux disease 09/17/2008   CHOLECYSTECTOMY, HX OF 09/17/2008    PCP: Sebastian Beverley NOVAK, MD  REFERRING PROVIDER: Sebastian Beverley NOVAK, MD  REFERRING DIAG:  Diagnosis  M81.0 (ICD-10-CM) - Age-related osteoporosis without current pathological fracture    THERAPY DIAG:  Other low back pain  Muscle weakness (generalized)  Abnormal posture  Rationale for Evaluation and Treatment: Rehabilitation  ONSET DATE: 12/12/2023  SUBJECTIVE:   SUBJECTIVE STATEMENT: Feeling pretty good.  No pain in back, just weakness and leg numbness intermittently (with walking or lifting) Feels better with touching toes.    From Eval: Patient presents with age related osteoporosis. The MD did not explain to her what is. She reports having degenerative joint disease in her back and she wears a back brace everyday. She wears a back brace when she lifts things like pots. She sometimes has tinglinh  that goes down her legs. Her back pain started when she got an epidural during child birth.  PERTINENT HISTORY: Osteoporosis; OA; asthma; COPD; depression; HTN; see DEXA results below  PAIN:  Are you having pain? Yes: NPRS scale: 0/10 Pain location: lumbar spine into buttocks Pain description: achy; sharp Aggravating factors: bending; walking ; lifting Relieving factors: medication; forward trunk bend to touch her toes  PRECAUTIONS: None  RED FLAGS: None   WEIGHT BEARING RESTRICTIONS: No  FALLS:  Has patient fallen  in last 6 months? No  LIVING ENVIRONMENT: Lives with: lives with their son Lives in: House/apartment Stairs: Yes stairs but she does not use them. 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  DIAGNOSTIC FINDINGS: DEXA 6/25 LEFT FEMORAL NECK: T-score: -1.9  LEFT TOTAL HIP: T-score: -1.3   RIGHT FEMORAL NECK: T-score: -2.4  RIGHT TOTAL HIP: T-score: -1.6  LEFT FOREARM (RADIUS 33%): T-score: -2.5   PATIENT SURVEYS:  Eval:  Modified Oswestry: 13/50 26% moderate disability  9/11 16/50 32%  03/18/2024:  Modified Oswestry Low Back Pain Disability Questionnaire: 22 / 50 = 44.0 %  10/21: 28%    COGNITION: Overall cognitive status: Within functional limits for tasks assessed     SENSATION: Patient has occasional tingling in her legs   MUSCLE LENGTH: Hamstrings:decreased bilateral   POSTURE: rounded shoulders and forward head   LOWER EXTREMITY ROM:WFL bilateral    LOWER EXTREMITY MMT:  MMT Right eval Left eval 10/21  Hip flexion 4- 4- 4+  Hip extension     Hip abduction 4- 4- 4+  Hip adduction 4- 4- 4+  Hip internal rotation     Hip external rotation     Knee flexion 4 4 4+  Knee extension 4 4 4+   Ankle dorsiflexion     Ankle plantarflexion     Ankle inversion     Ankle eversion      (Blank rows = not tested)   FUNCTIONAL TESTS:  Eval: 5 times sit to stand: 24.91 sec with UE support Timed up and go (TUG): 10.85 sec  9/11:  5STS: 13.69  03/18/2024: 5 times sit to stand: 13.84 sec 6 minute walk  test:  1,066 ft with RPE of 6/10 and one standing recovery period (age related norms are 1,155 ft)  10/21: 6 MWT 1296 RPE 4-5/10   GAIT:  Comments: decreased cadence; narrow BOS  TREATMENT DATE:  04/08/2024: ODI Review of HEP 6 MWT Seated green band with handles upright row with trunk extension 10x (Added to HEP- see below) Seated green band clams 10x (Added to HEP- see below) Seated green band horizontal abduction (Added to HEP- see below) Comprehensive review of HEP   04/03/2024: NuStep Level 5 x7 mins- PT present to discuss status Seated chest press 6# DB x 10 (very challenging) Seated 5# DB ear to hip x 10 bilateral  Standing row with green TB x 12 Standing extension with green TB x 12 Seated shoulder abduction with green TB x 12 Seated deadlift with green band anchored under feet x  10 Hip Matrix (abduction & flexion) x 15# 2 x 10 bilateral  Standing pallof press with pink long band x 12 each direction 6 inch step ups x 12 bilateral in // bars    03/27/2024: NuStep Level 5 x6 mins- PT present to discuss status Seated 5# kettlebell core series 7x each: hip to hip, shoulder to hip,V formations Seated 5# sit ups with press overhead 8x Seated deadlift with green band anchored under feet 7x Sit to/from stand holding 5# kettlebell x12 Standing shoulder rows with green band 2x10 (Added to HEP- see below)  Standing shoulder extensions with green band 10x (Added to HEP- see below) Pt given green band with handles for home use   03/18/2024: NuStep Level 3 x4 mins- PT present to discuss status 5 times sit to stand and modified oswestry 6 minute walk test:  1,066 ft with RPE of 6/10 and one standing recovery period Sit to/from stand holding 5# kettlebell with chest press 2x10 Seated modified deadlift with 5# kettlebell 2x10 Standing shoulder  horizontal abduction with red tband 2x10 Standing shoulder ER with red tband 2x10 Seated hamstring stretch 2x20 sec bilat     PATIENT EDUCATION:  Education details: Provided patient education and discussion on quality of life and stress management. Discussed motivators, challenges, and goals for future.  Person educated: Patient Education method: Explanation, Demonstration, and Handouts Education comprehension: verbalized understanding, returned demonstration, and needs further education  HOME EXERCISE PROGRAM: Access Code: [email or web address] URL: [email or web address] Date: 04/08/2024 Prepared by: Glade Pesa  Exercises - Seated Hamstring Stretch  - 1-2 x daily - 7 x weekly - 2 sets - 20-30s hold - Seated Figure 4 Piriformis Stretch  - 1-2 x daily - 7 x weekly - 2 sets - 20-30 hold - Sit to Stand with Armchair  - 1-2 x daily - 7 x weekly - 1-2 sets - 10 reps - Side Stepping with Resistance at Thighs  - 1-2 x daily - 7 x weekly - 3 sets - 10 reps - Heel Raises with Counter Support  - 1-2 x daily - 7 x weekly - 1-2 sets - 10 reps - Wall Push Up  - 1 x daily - 7 x weekly - 3 sets - 10 reps - Half Deadlift with Kettlebell  - 1 x daily - 7 x weekly - 2 sets - 5 reps - Standing Row with Anchored Resistance  - 1 x daily - 7 x weekly - 2 sets - 10 reps - Shoulder extension with resistance - Neutral  - 1 x daily - 7 x weekly - 2 sets - 10 reps - Seated Shoulder Row with Resistance Anchored at Feet  - 1 x daily - 7 x weekly - 1 sets - 10 reps - Seated Shoulder Horizontal Abduction with Resistance  - 1 x daily - 7 x  weekly - 1 sets - 10 reps - Seated Hip Abduction with Resistance  - 1 x daily - 7 x weekly - 1 sets - 10 reps   ASSESSMENT:  CLINICAL IMPRESSION: The patient has met the majority of rehab goals, with noted improvements in pain reduction, outcome score, ROM, strength and functional mobility.  A comprehensive HEP has been established and anticipate further improvements over  time with regular performance of the program.  Recommend discharge from PT at this time.    OBJECTIVE IMPAIRMENTS: Abnormal gait, decreased balance, decreased mobility, decreased strength, hypomobility, increased muscle spasms, impaired flexibility, improper body mechanics, postural dysfunction, and pain.   ACTIVITY LIMITATIONS: lifting, bending, standing, squatting, sleeping, stairs, transfers, and locomotion level  PARTICIPATION LIMITATIONS: meal prep, cleaning, laundry, driving, shopping, and community activity  PERSONAL FACTORS: Age, Fitness, Time since onset of injury/illness/exacerbation, and 3+ comorbidities: Osteoporosis; OA; asthma; COPD; depression; HTN;  are also affecting patient's functional outcome.   REHAB POTENTIAL: Good  CLINICAL DECISION MAKING: Evolving/moderate complexity  EVALUATION COMPLEXITY: Moderate   GOALS: Goals reviewed with patient? Yes  SHORT TERM GOALS: Target date: 02/12/2024  Patient will be independent with initial HEP. Baseline:  Goal status: MET 02/28/2024  2.  Patient will demonstrate improved upright posture and spinal alignment during standing and sitting task to reduce mechanical strain.  Baseline:  Goal status:MET 02/28/2024  3.   Patient will be able to participate in 3/6 minute walk test to establish a baseline measurement. Baseline:  Goal status: GOAL MET on 02/22/24 Pt stated walking 10 min prior to attending session  4.  Patient will perform 5STS in < or = to 20 sec due to improved LE strength and functional mobility to maintain independence. Baseline: 24.91 sec Goal status: MET 02/28/2024   LONG TERM GOALS: Target date: 05/09/2024  Patient will demonstrate independence in advanced HEP. Baseline:  Goal status: IN PROGRESS   2.  Patient will report > or = to 50% improvement in back pain since starting PT. Baseline:  Goal status: MET (60%) 03/05/2024  3.  Patient demonstrate correct lifting technique to prevent spontaneous  fractures and mechanical strain/increased pain. Baseline:  Goal status: Progressing  4.  Patient will improve modified Oswestry to no greater than 25% to demonstrate improvements in functional mobility. Baseline: 13/50 26% (44% on 03/18/24) Goal status: REVISED on 03/18/24  5.  Patient will perform 5STS in < or = 16 sec for improved functional mobility and independence. Baseline: 24.91 sec Goal status: MET 02/28/2024  6.  Patient will increase bilateral LE strength to [facility] to allow her to navigate stairs with reciprocal pattern without difficulty. Baseline:  Goal status: REVISED on 03/18/24  7.  Patient will be able to ambulate at least 1,155 ft in 6 minutes to allow her to be at her age related norms. Baseline:  1,066 ft with RPE of 6/10 Goal status:  met 10/21  PLAN: PHYSICAL THERAPY DISCHARGE SUMMARY  Visits from Start of Care: 14  Current functional level related to goals / functional outcomes: See clinical impressions above   Remaining deficits: As above   Education / Equipment: HEP   Patient agrees to discharge. Patient goals were met. Patient is being discharged due to meeting the stated rehab goals.     Glade Pesa, PT 04/08/24 9:41 PM Phone: [phone] Fax: [phone]  [facility] [address] [place], [place] [address] Phone # [phone] Fax [phone]

## 2024-04-15 ENCOUNTER — Ambulatory Visit: Admitting: Family Medicine

## 2024-04-15 ENCOUNTER — Encounter: Payer: Self-pay | Admitting: Family Medicine

## 2024-04-15 VITALS — BP 157/77 | HR 61 | Temp 97.0°F | Resp 18 | Wt 130.4 lb

## 2024-04-15 DIAGNOSIS — I10 Essential (primary) hypertension: Secondary | ICD-10-CM | POA: Diagnosis not present

## 2024-04-15 DIAGNOSIS — K222 Esophageal obstruction: Secondary | ICD-10-CM

## 2024-04-15 DIAGNOSIS — N1832 Chronic kidney disease, stage 3b: Secondary | ICD-10-CM

## 2024-04-15 DIAGNOSIS — I739 Peripheral vascular disease, unspecified: Secondary | ICD-10-CM | POA: Insufficient documentation

## 2024-04-15 DIAGNOSIS — K649 Unspecified hemorrhoids: Secondary | ICD-10-CM

## 2024-04-15 DIAGNOSIS — R7303 Prediabetes: Secondary | ICD-10-CM | POA: Insufficient documentation

## 2024-04-15 DIAGNOSIS — E782 Mixed hyperlipidemia: Secondary | ICD-10-CM | POA: Diagnosis not present

## 2024-04-15 DIAGNOSIS — G459 Transient cerebral ischemic attack, unspecified: Secondary | ICD-10-CM | POA: Diagnosis not present

## 2024-04-15 DIAGNOSIS — M160 Bilateral primary osteoarthritis of hip: Secondary | ICD-10-CM | POA: Insufficient documentation

## 2024-04-15 DIAGNOSIS — M810 Age-related osteoporosis without current pathological fracture: Secondary | ICD-10-CM

## 2024-04-15 DIAGNOSIS — K5904 Chronic idiopathic constipation: Secondary | ICD-10-CM

## 2024-04-15 LAB — COMPREHENSIVE METABOLIC PANEL WITH GFR
ALT: 17 U/L (ref 0–35)
AST: 24 U/L (ref 0–37)
Albumin: 4.4 g/dL (ref 3.5–5.2)
Alkaline Phosphatase: 58 U/L (ref 39–117)
BUN: 12 mg/dL (ref 6–23)
CO2: 30 meq/L (ref 19–32)
Calcium: 9.5 mg/dL (ref 8.4–10.5)
Chloride: 104 meq/L (ref 96–112)
Creatinine, Ser: 1.04 mg/dL (ref 0.40–1.20)
GFR: 48.58 mL/min — ABNORMAL LOW (ref >60.00)
Glucose, Bld: 115 mg/dL — ABNORMAL HIGH (ref 70–99)
Potassium: 3.8 meq/L (ref 3.5–5.1)
Sodium: 141 meq/L (ref 135–145)
Total Bilirubin: 1.7 mg/dL — ABNORMAL HIGH (ref 0.2–1.2)
Total Protein: 7.6 g/dL (ref 6.0–8.3)

## 2024-04-15 LAB — LIPID PANEL
Cholesterol: 198 mg/dL (ref 0–200)
HDL: 56.9 mg/dL (ref >39.00)
LDL Cholesterol: 127 mg/dL — ABNORMAL HIGH (ref 0–99)
NonHDL: 140.92
Total CHOL/HDL Ratio: 3
Triglycerides: 71 mg/dL (ref 0.0–149.0)
VLDL: 14.2 mg/dL (ref 0.0–40.0)

## 2024-04-15 LAB — HEMOGLOBIN A1C: Hgb A1c MFr Bld: 5.7 % (ref 4.6–6.5)

## 2024-04-15 MED ORDER — HYDROCHLOROTHIAZIDE 12.5 MG PO CAPS: 12.5000 mg | ORAL_CAPSULE | Freq: Every day | ORAL | 0 refills | Status: DC

## 2024-04-15 MED ORDER — LINACLOTIDE 72 MCG PO CAPS: ORAL_CAPSULE | ORAL | 0 refills | Status: DC

## 2024-04-15 NOTE — Progress Notes (Signed)
 Assessment & Plan   Assessment/Plan:    Assessment and Plan Assessment & Plan Essential hypertension Blood pressure is elevated at 157/66 mmHg, with home readings around 150/81 mmHg. Previous goal was 120/80 mmHg, but recent readings have been higher. Potential causes include pain and salt intake. Current medications include losartan  50 mg and hydrochlorothiazide  12.5 mg. Discussed increasing hydrochlorothiazide  but noted previous dehydration issues. She prefers to start with separate hydrochlorothiazide  and losartan  tablets to monitor for dehydration. - Start hydrochlorothiazide  12.5 mg daily - Recheck blood pressure in one month  Chronic kidney disease, stage 3b Requires monitoring of renal function. - Order complete metabolic panel to recheck renal function  Peripheral arterial disease Goal LDL under 70 mg/dL. Discussed potential addition of GLP-1 agonist if hyperglycemia persists to prevent cardiovascular disease.  Mixed hyperlipidemia Managed with rosuvastatin 10 mg. - Order lipid panel to assess LDL levels  Prediabetes Managed with lifestyle modifications. Discussed risk stratification with fasting insulin test. - Order fasting insulin test  Osteoarthritis of bilateral hips and degenerative disc disease of lumbar spine Bilateral hip pain and degenerative disc disease of lumbar spine causing significant discomfort. Previous physical therapy helped with arm pain but not with hip or back pain. Discussed referral to sports medicine for pain management and possible injections. She prefers non-surgical options. - Refer to sports medicine for pain management and possible injections  Constipation and hemorrhoids Reports feeling of obstruction during bowel movements, likely due to hemorrhoids. Constipation present. Discussed referral to gastroenterology for further evaluation and management of hemorrhoids and reflux. She uses aloe vera for constipation relief. - Start Linzess 72 mcg  daily before a meal - Refer to gastroenterology for evaluation of hemorrhoids and reflux  Gastroesophageal reflux disease Previous esophageal irritation. Requires gastroenterology follow-up for comprehensive management. - Refer to gastroenterology for evaluation and management      There are no discontinued medications.  No follow-ups on file.        Subjective:   Encounter date: 04/15/2024  Barbara  F [name] is a 88 y.o. female who has Allergic rhinitis; Intrinsic asthma; Laryngopharyngeal reflux disease; CHOLECYSTECTOMY, HX OF; Family history of malignant neoplasm of gastrointestinal tract; History of colonic polyps; Stricture and stenosis of esophagus; TIA (transient ischemic attack); HLD (hyperlipidemia); Headache on top of head; Essential hypertension; Neck pain; Low back pain; Pleurisy; Dyslipidemia; Chest pain; Abnormal EKG; Myalgia; Thrush; Palpitations; Keratoconjunctivitis sicca of both eyes not due to Sjogren's syndrome; Bilateral sciatica; Anemia; Leucopenia; Duffy-Null Associated Neutrophil Count [facility]); Stenosing tenosynovitis; CKD stage 3b, GFR 30-44 ml/min (HCC); Postmenopausal estrogen deficiency; Anaphylactic syndrome; Age-related osteoporosis without current pathological fracture; Asthmatic bronchitis , chronic (HCC); PAD (peripheral artery disease); Prediabetes; Gastroesophageal reflux disease without esophagitis; Long term (current) use of anticoagulants; and Severe persistent asthma without complication (HCC) on their problem list..   She  has a past medical history of Arthritis, Asthma, Blood clotting disorder, Cataract, COPD (chronic obstructive pulmonary disease) (HCC), Depression, Diverticulosis (10-2011), GERD (gastroesophageal reflux disease), Hemorrhoids, internal, colonic polyp, Hyperlipemia, Hypertension, IBS (irritable bowel syndrome), Stricture of esophagus (10-2011), and TIA (transient ischemic attack)..   She presents with chief complaint of  Hyperlipidemia (6 month follow up//HM due- vaccinations ), Hypertension (Pt stated blood pressure at home range from 150/81), and Hip Pain (Pt c/o of joint pain in the hips (bilateral) due to osteoporosis; she is currently doing PT and voiced it is helping a little but is not curing the issue  ) .   Discussed the use of AI scribe software for clinical note transcription  with the patient, who gave verbal consent to proceed.  History of Present Illness Barbara  F [name] is an 88 year old female with hyperlipidemia, hypertension, peripheral arterial disease, and CKD stage 3B who presents for chronic disease follow-up.  Hypertension - Elevated home blood pressure readings around 150/81, previously around 120 systolic - Suspects dietary salt intake as a contributing factor but has been avoiding salt - Current antihypertensive regimen: losartan  50 mg and hydrochlorothiazide  12.5 mg  Hyperlipidemia and peripheral arterial disease - Hyperlipidemia managed with rosuvastatin 10 mg - History of peripheral arterial disease  Chronic kidney disease - CKD stage 3B  Prediabetes - Managed with lifestyle modifications  Musculoskeletal pain and degenerative changes - Bilateral hip pain due to osteoarthritis - Morning stiffness in hips improves with movement - Physical therapy has alleviated arm pain but not hip pain - Degenerative spinal changes including bulging discs - Previous orthopedic consultation recommended physical therapy and injections; injections not pursued  Asthma and pulmonary history - History of asthma - Previous pulmonology evaluation for pneumonia and anaphylaxis - No chest pain or shortness of breath  Gastrointestinal symptoms - Sensation of obstruction in colon during bowel movements, attributed to internal hemorrhoid - Constipation managed with aloe vera - No blood in stool  Gastroesophageal reflux disease - History of esophageal irritation due to reflux requiring pureed  food intake     ROS  Past Surgical History:  Procedure Laterality Date   CESAREAN SECTION  1962   LAPAROSCOPIC CHOLECYSTECTOMY  1995    Outpatient Medications Prior to Visit  Medication Sig Dispense Refill   acetaminophen  (TYLENOL ) 500 MG tablet Take 1-2 tablets (500-1,000 mg total) by mouth every 6 (six) hours as needed for headache. 30 tablet 0   albuterol  (PROVENTIL ) (2.5 MG/3ML) 0.083% nebulizer solution Take 3 mLs (2.5 mg total) by nebulization every 6 (six) hours as needed for wheezing or shortness of breath. 75 mL 12   albuterol  (VENTOLIN  HFA) 108 (90 Base) MCG/ACT inhaler INHALE TWO PUFFS BY MOUTH INTO LUNGS EVERY SIX HOURS AS NEEDED FOR WHEEZING AND/OR SHORTNESS OF BREATH 8.5 g 2   atorvastatin  (LIPITOR) 40 MG tablet Take 1 tablet (40 mg total) by mouth daily. 30 tablet 0   azelastine  (ASTELIN ) 0.1 % nasal spray Place 2 sprays into both nostrils 2 (two) times daily. 30 mL 12   benzonatate  (TESSALON ) 100 MG capsule Take 1 capsule (100 mg total) by mouth 3 (three) times daily as needed for cough. 30 capsule 0   cetirizine  (ZYRTEC  ALLERGY) 10 MG tablet Take 1 tablet (10 mg total) by mouth at bedtime. 90 tablet 3   cholecalciferol (VITAMIN D3) 25 MCG (1000 UNIT) tablet Take 1,000 Units by mouth daily.     clopidogrel  (PLAVIX ) 75 MG tablet Take 1 tablet (75 mg total) by mouth daily. 90 tablet 0   cycloSPORINE  (RESTASIS ) 0.05 % ophthalmic emulsion Place 1 drop into both eyes 2 (two) times daily.     EPINEPHrine  0.3 mg/0.3 mL IJ SOAJ injection Inject 0.3 mg into the muscle once as needed (for a severe allergic reaction/use as directed). 1 each 1   fluticasone  (FLONASE ) 50 MCG/ACT nasal spray Place 2 sprays into both nostrils 2 (two) times daily. 18.2 mL 11   hydrochlorothiazide  (MICROZIDE ) 12.5 MG capsule Take 12.5 mg by mouth daily.     HYDROcodone  bit-homatropine (HYCODAN) 5-1.5 MG/5ML syrup Take 5 mLs by mouth every 6 (six) hours as needed for cough. 240 mL 0   losartan  (COZAAR ) 50  MG tablet Take  50 mg by mouth daily.     montelukast  (SINGULAIR ) 10 MG tablet Take 1 tablet (10 mg total) by mouth at bedtime. 90 tablet 3   Multiple Vitamin (MULTIVITAMIN) tablet Take 1 tablet by mouth daily.      ondansetron  (ZOFRAN -ODT) 4 MG disintegrating tablet Take 1 tablet (4 mg total) by mouth every 8 (eight) hours as needed for nausea or vomiting. 20 tablet 0   pantoprazole  (PROTONIX ) 40 MG tablet Take 1 tablet (40 mg total) by mouth 2 (two) times daily before a meal. 180 tablet 3   Probiotic Product (PROBIOTIC PO) Take 1 capsule by mouth daily.      Propylene Glycol (SYSTANE BALANCE) 0.6 % SOLN Place 1 drop into both eyes 4 (four) times daily as needed (dry eyes).     Respiratory Therapy Supplies (FLUTTER) DEVI Use device 2-3 times a day to break up congestion 1 each 0   rosuvastatin (CRESTOR) 10 MG tablet Take 10 mg by mouth at bedtime.     Spacer/Aero-Holding Chambers (AEROCHAMBER MV) inhaler Use as instructed 1 each 0   SYMBICORT  80-4.5 MCG/ACT inhaler Inhale 2 puffs into the lungs 2 (two) times daily. 30.6 g 3   No facility-administered medications prior to visit.    Family History  Problem Relation Age of Onset   Heart disease Mother    Hypertension Mother    Diabetes Mother    Asthma Father    Colon cancer Sister 4   Irritable bowel syndrome Sister    Breast cancer Other        Niece   Stomach cancer Neg Hx     Social History   Socioeconomic History   Marital status: Widowed    Spouse name: Not on file   Number of children: 1   Years of education: college   Highest education level: Not on file  Occupational History   Occupation: retired  Tobacco Use   Smoking status: Former    Current packs/day: 0.00    Average packs/day: 0.5 packs/day for 4.3 years (2.1 ttl pk-yrs)    Types: Cigarettes    Start date: 2    Quit date: 09/28/1959    Years since quitting: 64.5    Passive exposure: Never   Smokeless tobacco: Never  Vaping Use   Vaping status: Never Used   Substance and Sexual Activity   Alcohol  use: No    Alcohol /week: 0.0 standard drinks of alcohol    Drug use: No   Sexual activity: Not Currently  Other Topics Concern   Not on file  Social History Narrative   Patient is widowed with one child.   Patient is right handed.   Patient has college education.   Patient does not drink caffeine.   Social Drivers of Corporate Investment Banker Strain: Not on file  Food Insecurity: Low Risk  (06/08/2023)   Received from [facility]   Hunger Vital Sign    Within the past 12 months, you worried that your food would run out before you got money to buy more: Never true    Within the past 12 months, the food you bought just didn't last and you didn't have money to get more. : Never true  Recent Concern: Food Insecurity - Medium Risk (05/18/2023)   Received from [facility]   Hunger Vital Sign    Worried About Running Out of Food in the Last Year: Patient declined to answer    Ran Out of Food in the Last Year: Sometimes  true  Transportation Needs: No Transportation Needs (06/08/2023)   Received from [facility]    In the past 12 months, has lack of reliable transportation kept you from medical appointments, meetings, work or from getting things needed for daily living? : No  Physical Activity: Not on file  Stress: Not on file  Social Connections: Not on file  Intimate Partner Violence: Not At Risk (04/27/2023)   Humiliation, Afraid, Rape, and Kick questionnaire    Fear of Current or Ex-Partner: No    Emotionally Abused: No    Physically Abused: No    Sexually Abused: No                                                                                                  Objective:  Physical Exam: BP (!) 157/77 (BP Location: Right Arm, Patient Position: Sitting, Cuff Size: Large) Comment: recheck  Pulse 61   Temp (!) 97 F (36.1 C) (Temporal)   Resp 18   Wt 130 lb 6.4 oz (59.1 kg)   SpO2 98%   BMI 26.12 kg/m     Physical Exam VITALS: BP- 157/66 GENERAL: Alert, cooperative, well developed, no acute distress HEENT: Normocephalic, normal oropharynx, moist mucous membranes CHEST: Clear to auscultation bilaterally, no wheezes, rhonchi, or crackles CARDIOVASCULAR: Normal heart rate and rhythm, S1 and S2 normal without murmurs ABDOMEN: Soft, non-tender, non-distended, without organomegaly, normal bowel sounds EXTREMITIES: No cyanosis or edema NEUROLOGICAL: Cranial nerves grossly intact, moves all extremities without gross motor or sensory deficit   Physical Exam  No results found.  Recent Results (from the past 2160 hours)  Tryptase     Status: None   Collection Time: 01/23/24 10:48 AM  Result Value Ref Range   Tryptase 5.8 2.2 - 13.2 ug/L  Alpha-Gal Panel     Status: None   Collection Time: 01/23/24 10:48 AM  Result Value Ref Range   Class Description Allergens Comment     Comment:     Levels of Specific IgE       Class  Description of Class     ---------------------------  -----  --------------------                    < 0.10         0         Negative            0.10 -    0.31         0/I       Equivocal/Low            0.32 -    0.55         I         Low            0.56 -    1.40         II        Moderate            1.41 -    3.90  III       High            3.91 -   19.00         IV        Very High           19.01 -  100.00         V         Very High                   >100.00         VI        Very High    IgE (Immunoglobulin E), Serum 103 6 - 495 IU/mL   Pork IgE <0.10 Class 0 kU/L   Beef IgE <0.10 Class 0 kU/L   Allergen Lamb IgE <0.10 Class 0 kU/L   O215-[id number] Alpha-Gal <0.10 Class 0 kU/L  Allergens w/Total IgE Area 2     Status: Abnormal   Collection Time: 01/23/24 10:48 AM  Result Value Ref Range   D Pteronyssinus IgE 1.58 (A) Class III kU/L   D Farinae IgE 1.74 (A) Class III kU/L   Cat Dander IgE 0.20 (A) Class 0/I kU/L   Dog Dander IgE 12.00 (A) Class IV kU/L    Mouse Urine IgE 0.13 (A) Class 0/I kU/L   Bermuda Grass IgE <0.10 Class 0 kU/L   Timothy Grass IgE <0.10 Class 0 kU/L   Johnson Grass IgE <0.10 Class 0 kU/L   Cockroach, German IgE <0.10 Class 0 kU/L   Penicillium Chrysogen IgE <0.10 Class 0 kU/L   Cladosporium Herbarum IgE <0.10 Class 0 kU/L   Aspergillus Fumigatus IgE <0.10 Class 0 kU/L   Alternaria Alternata IgE <0.10 Class 0 kU/L   Maple/Box Elder IgE 0.31 (A) Class 0/I kU/L   Common Silver Valrie IgE 14.10 (A) Class IV kU/L   Cedar, [place] IgE 0.26 (A) Class 0/I kU/L   Oak, White IgE 3.37 (A) Class III kU/L   Elm, American IgE 0.39 (A) Class I kU/L   Cottonwood IgE 0.48 (A) Class I kU/L   Pecan, Hickory IgE 0.71 (A) Class II kU/L   White Mulberry IgE <0.10 Class 0 kU/L   Ragweed, Short IgE 6.04 (A) Class IV kU/L   Pigweed, Rough IgE <0.10 Class 0 kU/L   Sheep Sorrel IgE Qn <0.10 Class 0 kU/L  Allergen,Grn Pepper,Paprika,f218     Status: None   Collection Time: 01/23/24 10:48 AM  Result Value Ref Range   Paprika IgE <0.10 Class 0 kU/L  Mint IgE     Status: None   Collection Time: 01/23/24 10:48 AM  Result Value Ref Range   Mint IgE <0.10 Class 0 kU/L  Allergen, Rice, f9     Status: None   Collection Time: 01/23/24 10:48 AM  Result Value Ref Range   Allergen Rice IgE <0.10 Class 0 kU/L  Allergen, Mushroom, [id number]     Status: None   Collection Time: 01/23/24 10:48 AM  Result Value Ref Range   Mushroom IgE <0.10 Class 0 kU/L  POC SARS Coronavirus 2 Ag     Status: None   Collection Time: 03/14/24 10:41 AM  Result Value Ref Range   SARS Coronavirus 2 Ag Negative Negative        Beverley Adine Hummer, MD, MS

## 2024-04-15 NOTE — Patient Instructions (Addendum)
 It was very nice to see you today!    VISIT SUMMARY: Today, we reviewed your chronic conditions including hypertension, hyperlipidemia, peripheral arterial disease, chronic kidney disease, prediabetes, osteoarthritis, and gastrointestinal issues. We discussed adjustments to your medications and potential referrals to specialists for further management.  YOUR PLAN: ESSENTIAL HYPERTENSION: Your blood pressure readings have been higher than your goal of 120/80 mmHg. -Start taking hydrochlorothiazide  12.5 mg daily. -Recheck your blood pressure in one month.  CHRONIC KIDNEY DISEASE, STAGE 3B: We need to monitor your kidney function closely. -A complete metabolic panel will be ordered to recheck your kidney function.  PERIPHERAL ARTERIAL DISEASE: We aim to keep your LDL cholesterol under 70 mg/dL to prevent cardiovascular issues. -We discussed the potential addition of a GLP-1 agonist if your blood sugar levels remain high.  MIXED HYPERLIPIDEMIA: Your cholesterol levels are being managed with rosuvastatin. -A lipid panel will be ordered to assess your LDL levels.  PREDIABETES: Your prediabetes is being managed with lifestyle changes. -A fasting insulin test will be ordered to better understand your risk.  OSTEOARTHRITIS OF BILATERAL HIPS AND DEGENERATIVE DISC DISEASE OF LUMBAR SPINE: You have significant discomfort due to hip pain and degenerative disc disease. -You will be referred to sports medicine for pain management and possible injections.  CONSTIPATION AND HEMORRHOIDS: You have a sensation of obstruction during bowel movements, likely due to hemorrhoids, and constipation. -Start taking Linzess 72 mcg daily before a meal. -You will be referred to gastroenterology for further evaluation of your hemorrhoids and reflux.  GASTROESOPHAGEAL REFLUX DISEASE: You have a history of esophageal irritation due to reflux. -You will be referred to gastroenterology for evaluation and  management.    Return in about 1 month (around 05/16/2024) for BP.   Take care, Arvella Hummer, MD, MS   PLEASE NOTE:  If you had any lab tests, please let us  know if you have not heard back within a few days. You may see your results on mychart before we have a chance to review them but we will give you a call once they are reviewed by us .   If we ordered any referrals today, please let us  know if you have not heard from their office within the next week.   If you had any urgent prescriptions sent in today, please check with the pharmacy within an hour of our visit to make sure the prescription was transmitted appropriately.   Please try these tips to maintain a healthy lifestyle:  Eat at least 3 REAL meals and 1-2 snacks per day.  Aim for no more than 5 hours between eating.  If you eat breakfast, please do so within one hour of getting up.   Each meal should contain half fruits/vegetables, one quarter protein, and one quarter carbs (no bigger than a computer mouse)  Cut down on sweet beverages. This includes juice, soda, and sweet tea.   Drink at least 1 glass of water with each meal and aim for at least 8 glasses per day  Exercise at least 150 minutes every week.

## 2024-04-16 LAB — INSULIN, RANDOM: Insulin: 19.4 u[IU]/mL — ABNORMAL HIGH

## 2024-04-17 ENCOUNTER — Ambulatory Visit: Payer: Self-pay

## 2024-04-17 ENCOUNTER — Encounter: Admitting: Physical Therapy

## 2024-04-17 ENCOUNTER — Ambulatory Visit: Payer: Self-pay | Admitting: Family Medicine

## 2024-04-17 NOTE — Telephone Encounter (Signed)
 Please advise pt via phone if any adjustments need to be made prior to next visit. Was recently seen on 04/15/24 for same. Pt concerned about potential effect on kidneys d/t HTN.   FYI Only or Action Required?: Action required by provider: update on patient condition.  Patient was last seen in primary care on 04/15/2024 by Sebastian Beverley NOVAK, MD.  Called Nurse Triage reporting Hypertension.  Symptoms began chronic, adjusted meds on 04/15/24.  Interventions attempted: Prescription medications: losartan  and hctz.  Symptoms are: unchanged.  Triage Disposition: See Physician Within 24 Hours  Patient/caregiver understands and will follow disposition?:  Reason for Disposition  Systolic BP >= 180 OR Diastolic >= 110  Answer Assessment - Initial Assessment Questions Pt states she takes her BP meds in the AM. States readings since med change on 04/15/24 were:  Previous reading since Tuesday appt 135/73, 178/90,168/92,165/94 178/90. States when elevated, she called EMS last night and they rechecked and stayed with her and her BP came down. Also reports low grade headache.  1. BLOOD PRESSURE: What is your blood pressure? Did you take at least two measurements 5 minutes apart?     See above  2. ONSET: When did you take your blood pressure?     Multiple times daily  3. HOW: How did you take your blood pressure? (e.g., automatic home BP monitor, visiting nurse)     Machine  4. HISTORY: Do you have a history of high blood pressure?     Yes  5. MEDICINES: Are you taking any medicines for blood pressure? Have you missed any doses recently?     States does not miss medications, takes losartan  and hydrochlorothiazide .  6. OTHER SYMPTOMS: Do you have any symptoms? (e.g., blurred vision, chest pain, difficulty breathing, headache, weakness)     Mild headache  Protocols used: Blood Pressure - High-A-AH Copied from CRM #[id number]. Topic: Clinical - Red Word Triage >> Apr 17, 2024   9:55 AM [name] wrote: Red Word that prompted transfer to Nurse Triage: in office Tuesday for High BP, after taken medication. Still currently experience elevated BP, headaches and unable to sleep as a result. Inquiring if BP medication can be adjusted.   Previous reading since Tuesday appt 135/73 normally 120/?  178/90.. 168/92 ..165/94 178/90.[name]

## 2024-04-18 NOTE — Telephone Encounter (Signed)
 Noted

## 2024-04-18 NOTE — Telephone Encounter (Signed)
 Copied from CRM [id number]. Topic: Clinical - Medical Advice >> Apr 18, 2024  9:45 AM [name] wrote: Reason for CRM: pt called and stated that she is getting good readings of her bp and will keep her appt for her one month f/u.

## 2024-04-24 ENCOUNTER — Ambulatory Visit: Admitting: Podiatry

## 2024-04-24 ENCOUNTER — Encounter: Payer: Self-pay | Admitting: Podiatry

## 2024-04-24 DIAGNOSIS — M79674 Pain in right toe(s): Secondary | ICD-10-CM

## 2024-04-24 DIAGNOSIS — B351 Tinea unguium: Secondary | ICD-10-CM

## 2024-04-24 DIAGNOSIS — M79675 Pain in left toe(s): Secondary | ICD-10-CM

## 2024-04-24 DIAGNOSIS — M216X1 Other acquired deformities of right foot: Secondary | ICD-10-CM

## 2024-04-24 DIAGNOSIS — Z7901 Long term (current) use of anticoagulants: Secondary | ICD-10-CM

## 2024-04-24 DIAGNOSIS — L84 Corns and callosities: Secondary | ICD-10-CM

## 2024-04-24 NOTE — Patient Instructions (Signed)
 Look for urea 40% cream or ointment and apply to the thickened dry skin / calluses. This can be bought over the counter, at a pharmacy or online such as [employer].  You can also scrub the callus with white vinegar which will make it easier to file down with a pumice stone or emory board.  Look for felt dancers pads to help offload the area of the callus.  You can find these on [employer] or from strictlymakeup.fr.  You will need to find ones that are made for the left foot since they are technically designed to offload the big toe joint which is opposite the location of your callus.

## 2024-04-24 NOTE — Progress Notes (Signed)
  Subjective:  Patient ID: Barbara  LATERA [name], female    DOB: 09-20-1935,  MRN: [id number]  Chief Complaint  Patient presents with   RFC     RFC callus R 5th et plantar and 4th toe lateral. prediabetic A1c 5.7.  Plavix     88 y.o. female presents with the above complaint. History confirmed with patient. Patient presenting with pain related to dystrophic thickened elongated nails. Patient is unable to trim own nails related to nail dystrophy and/or mobility issues. She reports being pre-diabetic. She does have history of PAD and does report taking plavix . Patient does have callus present located at the right sub 5th metatarsal head causing pain.  Objective:  Physical Exam: warm to cool, diminished capillary refill, pedal skin atrophic, pedal hair growth absent nail exam onychomycosis of the toenails protective sensation intact, DP and PT pulses very faint, subjective paresthesias Left Foot:  Pain with palpation of nails due to elongation and dystrophic growth.  Right Foot: Pain with palpation of nails due to elongation and dystrophic growth. Plantarflexed, prominent 5th metatarsal head Right foot preulcerative callus right subfifth metatarsal head Assessment:   1. Chronic anticoagulation   2. Pain due to onychomycosis of toenails of both feet   3. Callus   4. Plantar flexed metatarsal, right      Plan:  Patient was evaluated and treated and all questions answered.  #Hyperkeratotic lesions/pre ulcerative calluses present subfifth metatarsal head right foot # Prominent fifth metatarsal head right foot All symptomatic hyperkeratoses x 1 were safely debrided as a courtesy with a sterile #312 blade to patient's level of comfort without incident. We discussed preventative and palliative care of these lesions including supportive and accommodative shoegear, padding, prefabricated and custom molded accommodative orthoses, use of a pumice stone and lotions/creams daily. - High risk footcare due  to diminished pulses, paresthesias to feet, chronic Plavix  use causing coagulation defect - Secondary to fat pad atrophy and prominent plantarflexed right fifth metatarsal head.  Discussed homecare interventions -ABN on file.  #Onychomycosis with pain  -Nails palliatively debrided as below. -Educated on self-care - Chronically anticoagulated taking Plavix .  Procedure: Nail Debridement Rationale: Pain Type of Debridement: manual, sharp debridement. Instrumentation: Nail nipper, rotary burr. Number of Nails: 10     Return in about 3 months (around 07/25/2024) for Routine Foot Care.         Ethan Saddler, DPM [facility] / [facility]

## 2024-04-24 NOTE — Telephone Encounter (Signed)
 Pt has a BP follow up scheduled for 05/27/2024, as recommended in her last OV.

## 2024-04-28 ENCOUNTER — Ambulatory Visit (HOSPITAL_BASED_OUTPATIENT_CLINIC_OR_DEPARTMENT_OTHER)
Admission: RE | Admit: 2024-04-28 | Discharge: 2024-04-28 | Disposition: A | Discharge status: Home / Self Care | Source: Ambulatory Visit

## 2024-04-28 ENCOUNTER — Ambulatory Visit

## 2024-04-28 VITALS — BP 120/70 | Ht 59.5 in | Wt 130.0 lb

## 2024-04-28 DIAGNOSIS — M25551 Pain in right hip: Secondary | ICD-10-CM | POA: Diagnosis not present

## 2024-04-28 DIAGNOSIS — M25552 Pain in left hip: Secondary | ICD-10-CM | POA: Insufficient documentation

## 2024-04-28 NOTE — Progress Notes (Signed)
   Subjective:    Patient ID: Amariyana  F Hodgdon, female    DOB: 88 y.o., May 13, 1936   MRN: [id number]  Chief Complaint: Bilateral hip pain  Discussed the use of AI scribe software for clinical note transcription with the patient, who gave verbal consent to proceed.  History of Present Illness Mathilde  F Zipp is an 88 year old female with osteoporosis who presents with hip pain. She was referred by Dr. Sebastian for further evaluation of her hip pain.  Hip pain - Progressive pain localized to the left hip - Most severe in the mornings - Radiates from the back down the leg - Associated with a separate back issue - Managed with Tylenol , physical therapy, walking, and stretching exercises  Osteoporosis - Diagnosed by DEXA scan indicating osteoporosis or osteopenia  Musculoskeletal symptoms - No recent episodes of finger locking   Review of pertinent imaging: None available of her hips in our system.    Objective:   Vitals:   04/28/24 1013  BP: 120/70   Bilateral hips: Tenderness palpation of the greater trochanter.  (Patient reports that this is her source of pain that she has been experiencing at home) FADIR and FABER both localize to the greater trochanter. Nontender over ASIS, AIIS.  Nontender over SI joint. Equivocal logroll.  Equivocal axial load.    Assessment & Plan:   Assessment & Plan Bilateral hip bursitis   Chronic bilateral hip pain is worse on the left side, with tenderness over the lateral hip. Pain is exacerbated by movement and palpation. The differential diagnosis includes arthritis, but hip bursitis is suspected due to the pain's location and nature. There is no radiating pain to the leg. Previous physical therapy provided some relief. Shockwave therapy was discussed as a treatment option, offering benefits such as encouraging healing factors and being well-tolerated, though not covered by insurance. Risks include bruising and discomfort during treatment.  Shockwave therapy is most effective when combined with physical therapy. X-rays of the hips are ordered to rule out arthritis. Physical therapy is initiated to strengthen tendons and muscles around the hip. Anti-inflammatory medication is prescribed to reduce pain and inflammation. Shockwave therapy is considered if initial treatment is ineffective. A follow-up is scheduled in one month to assess progress and consider shockwave therapy if needed.

## 2024-04-28 NOTE — Patient Instructions (Signed)
  VISIT SUMMARY: You visited us  today due to progressive left hip pain, which is most severe in the mornings and radiates from your back down your leg. We discussed your hip pain, which is likely due to hip bursitis, and reviewed treatment options including physical therapy and medications.  YOUR PLAN: BILATERAL HIP BURSITIS: You have chronic pain in both hips, worse on the left side, likely due to hip bursitis. This condition causes pain and tenderness over the lateral hip, especially with movement and palpation. -Start physical therapy to strengthen the tendons and muscles around your hip. -Take anti-inflammatory medication as prescribed to reduce pain and inflammation. -Get X-rays of your hips to rule out arthritis. -Consider shockwave therapy if initial treatments are not effective. This therapy can encourage healing but may cause bruising and discomfort during treatment. It is most effective when combined with physical therapy. -Follow up in one month to assess your progress and consider shockwave therapy if needed. -Follow up whenever you'd like to talk about osteoporosis and potential treatments for this!

## 2024-05-10 ENCOUNTER — Ambulatory Visit: Payer: Self-pay

## 2024-05-13 NOTE — Therapy (Signed)
 OUTPATIENT PHYSICAL THERAPY LOWER EXTREMITY EVALUATION   Patient Name: [name]  [name] MRN: [id number] DOB:Mar 07, 1936, 88 y.o., female Today's Date: 05/14/2024  END OF SESSION:  PT End of Session - 05/14/24 1232     Visit Number 1    Date for Recertification  07/09/24    Authorization Type [employer] Medicare  auth pending    PT Start Time 1233    PT Stop Time 1316    PT Time Calculation (min) 43 min    Activity Tolerance Patient tolerated treatment well    Behavior During Therapy WFL for tasks assessed/performed          Past Medical History:  Diagnosis Date   Arthritis    Asthma    Blood clotting disorder    Cataract    COPD (chronic obstructive pulmonary disease) (HCC)    Depression    Diverticulosis 10-2011   Colonoscopy   GERD (gastroesophageal reflux disease)    Hemorrhoids, internal    Hx of colonic polyp    Hyperlipemia    Hypertension    IBS (irritable bowel syndrome)    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   EGD   TIA (transient ischemic attack)    Past Surgical History:  Procedure Laterality Date   CESAREAN SECTION  1962   LAPAROSCOPIC CHOLECYSTECTOMY  1995   Patient Active Problem List   Diagnosis Date Noted   PAD (peripheral artery disease) 04/15/2024   Prediabetes 04/15/2024   Bilateral primary osteoarthritis of hip 04/15/2024   Asthmatic bronchitis , chronic (HCC) 12/26/2023   Age-related osteoporosis without current pathological fracture 12/13/2023   Anaphylactic syndrome 11/29/2023   Duffy-Null Associated Neutrophil Count (DNANC) 10/09/2023   Stenosing tenosynovitis 10/09/2023   CKD stage 3b, GFR 30-44 ml/min (HCC) 10/09/2023   Postmenopausal estrogen deficiency 10/09/2023   Keratoconjunctivitis sicca of both eyes not due to Sjogren's syndrome 09/11/2023   Bilateral sciatica 09/11/2023   Anemia 09/11/2023   Leucopenia 09/11/2023   Palpitations 04/28/2023   Long term (current) use of anticoagulants 02/23/2023   Myalgia 07/21/2019   Thrush  07/21/2019   Dyslipidemia 05/24/2018   Chest pain 05/24/2018   Abnormal EKG 05/24/2018   Pleurisy 09/13/2017   Severe persistent asthma without complication (HCC) 05/10/2015   Neck pain 10/21/2014   Low back pain 10/21/2014   Essential hypertension 07/15/2014   HLD (hyperlipidemia) 03/24/2014   Headache on top of head 03/24/2014   TIA (transient ischemic attack) 03/13/2014   Gastroesophageal reflux disease without esophagitis 06/25/2012   Stricture and stenosis of esophagus 12/13/2011   Family history of malignant neoplasm of gastrointestinal tract 11/01/2011   History of colonic polyps 11/01/2011   Allergic rhinitis 09/17/2008   Intrinsic asthma 09/17/2008   Laryngopharyngeal reflux disease 09/17/2008   CHOLECYSTECTOMY, HX OF 09/17/2008    PCP: Sebastian Beverley NOVAK, MD   REFERRING PROVIDER: Charles Redell LABOR, DO   REFERRING DIAG:  [id number] (ICD-10-CM) - Greater trochanteric pain syndrome of left lower extremity  M25.551 (ICD-10-CM) - Greater trochanteric pain syndrome of right lower extremity    THERAPY DIAG:  Pain of both hip joints  Muscle weakness (generalized)  Rationale for Evaluation and Treatment: Rehabilitation  ONSET DATE: 2 years ago  SUBJECTIVE:   SUBJECTIVE STATEMENT:  Pain wakes me up at about 3 in the morning. Stretches help my back when I get up. Pain started years ago in my back. It doesn't get better. Back was really hurting this morning. Also gets numbness in her legs.When my back feels better, by hips feel  better.  PERTINENT HISTORY: Osteoporosis, Chronic LBP, COPD, TIA, blood clotting disorder PAIN:  Are you having pain? Yes: NPRS scale: 8/10 in the middle of the night, 6/10 during the day Pain location: B hips L>R Pain description: sharp Aggravating factors: standing and walking Relieving factors: stretches  PRECAUTIONS: Other: osteoporosis  RED FLAGS: None   WEIGHT BEARING RESTRICTIONS: No  FALLS:  Has patient fallen in last 6 months?  No  LIVING ENVIRONMENT: Lives with: lives with their son Lives in: House/apartment Stairs: Yes stairs but she does not use them. 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  DIAGNOSTIC FINDINGS:  XR IMPRESSION: 1. No acute fracture or dislocation of the hips. 2. No evidence of osteonecrosis.  DIAGNOSTIC FINDINGS: DEXA 6/25 LEFT FEMORAL NECK: T-score: -1.9   LEFT TOTAL HIP: T-score: -1.3   RIGHT FEMORAL NECK: T-score: -2.4   RIGHT TOTAL HIP: T-score: -1.6   LEFT FOREARM (RADIUS 33%): T-score: -2.5  PATIENT SURVEYS:  LEFS  Extreme difficulty/unable (0), Quite a bit of difficulty (1), Moderate difficulty (2), Little difficulty (3), No difficulty (4)  Survey date:    Any of your usual work, housework or school activities 1  2. Usual hobbies, recreational or sporting activities 0  3. Getting into/out of the bath 4  4. Walking between rooms 1  5. Putting on socks/shoes 1  6. Squatting  0  7. Lifting an object, like a bag of groceries from the floor 0  8. Performing light activities around your home 4  9. Performing heavy activities around your home 0  10. Getting into/out of a car 3  11. Walking 2 blocks 1  12. Walking 1 mile 0  13. Going up/down 10 stairs (1 flight) 2  14. Standing for 1 hour 1  15.  sitting for 1 hour 2  16. Running on even ground 0  17. Running on uneven ground 0  18. Making sharp turns while running fast 0  19. Hopping  0  20.  Rolling over in bed 1  Score total:  21/80     COGNITION: Overall cognitive status: Within functional limits for tasks assessed     SENSATION: Intermittent N/T in B leg L>R   MUSCLE LENGTH: Tightness in  B HS, piriformis   POSTURE: rounded shoulders and forward head  PALPATION: Palpation: TTP at B superolateral greater trochanter L>R, piriformis L> R, glueals L>R   LOWER EXTREMITY ROM:  Active ROM Right eval Left eval  Hip flexion    Hip extension    Hip abduction    Hip adduction    Hip internal rotation 34 25  Hip external rotation 21 13  Knee flexion    Knee extension    Ankle dorsiflexion    Ankle plantarflexion    Ankle inversion    Ankle eversion     (Blank rows = not tested)  LOWER EXTREMITY MMT:  MMT Right eval Left eval  Hip flexion 4- 4-  Hip extension    Hip abduction    Hip adduction  Hip internal rotation    Hip external rotation    Knee flexion    Knee extension 4+ 5  Ankle dorsiflexion 5 5  Ankle plantarflexion    Ankle inversion    Ankle eversion     (Blank rows = not tested)  LOWER EXTREMITY SPECIAL TESTS:  Hip special tests: Hip scouring test: positive  L ant medial  FUNCTIONAL TESTS:  5 times sit to stand: 20.64 sec used hands on first rep  GAIT: Comments: decreased cadence; narrow BOS                                                                                                                                 TREATMENT DATE:   05/14/24  See pt ed and HEP  If treatment provided at initial evaluation, no treatment charged due to lack of authorization.    PATIENT EDUCATION:  Education details: PT eval findings, anticipated POC, initial HEP, and possible trial of aquatics   Person educated: Patient Education method: Explanation, Demonstration, Tactile cues, Verbal cues, and Handouts Education comprehension: verbalized understanding and returned demonstration  HOME EXERCISE PROGRAM: Access Code: [email or web address] URL:  [email or web address] Date: 05/14/2024 Prepared by: Mliss  Exercises - Left hip internal rotation stretch  - 2 x daily - 7 x weekly - 1 sets - 2 reps - 30 sec hold - Seated Hip Internal Rotation AROM (Mirrored)  - 2 x daily - 7 x weekly - 2 sets - 10 reps - Supine Bridge  - 2 x daily - 7 x weekly - 1-3 sets - 10 reps - Seated Hip External Rotation AROM (Mirrored)  - 2 x daily - 7 x weekly - 1 sets - 3 reps - 30 sec hold  ASSESSMENT:  CLINICAL IMPRESSION: Patient is a 88 y.o. female who was seen today for physical therapy evaluation and treatment for bil hip pain. She was recently treated in this clinic for her chronic LBP. She reports her hip pain wakes her up in the night and she also feels pain with standing and walking. Her L hip is more painful than the right. She demonstrates deficits  in ROM in the L hip, and flexibility and strength in B LE. Her LEFS score  of 21/80 indicates significant diability. She will benefit from skilled PT to address these deficits and those listed below.   OBJECTIVE IMPAIRMENTS: Abnormal gait, decreased activity tolerance, decreased ROM, decreased strength, increased muscle spasms, impaired flexibility, postural dysfunction, and pain.   ACTIVITY LIMITATIONS: sitting, standing, squatting, sleeping, stairs, transfers, bed mobility, dressing, and locomotion level  PARTICIPATION LIMITATIONS: meal prep, cleaning, laundry, shopping, community activity, and occupation  PERSONAL FACTORS: Age and 3+ comorbidities: osteoporosis, chronic LBP, asthma, COPD, depression and HTN are also affecting patient's functional outcome.   REHAB POTENTIAL: Good  CLINICAL DECISION MAKING: Evolving/moderate complexity  EVALUATION COMPLEXITY: Moderate   GOALS: Goals reviewed with patient? Yes  SHORT TERM GOALS: Target date: 06/11/2024  Patient will be independent with initial HEP. Baseline:  Goal status: INITIAL  2.  Decreased hip pain by 30% with  ADLS. Baseline:  Goal status: INITIAL  3.  Patient able to sleep through the night without waking from hip pain. Baseline:  Goal status: INITIAL   LONG TERM GOALS: Target date: 07/09/2024   Patient will be independent with advanced/ongoing HEP to improve outcomes and carryover.  Baseline:  Goal status: INITIAL  2.  Patient will report decreased pain by 75% with ADLS. Baseline:  Goal status: INITIAL  3.  Patient will demonstrate improved functional LE strength as demonstrated by improved 5XSTS by 2-3 seconds. Baseline: 20.64 seconds Goal status: INITIAL  4.   Improved LEFS by >= 9 points showing functional improvement. Baseline: 21/80 Goal status: INITIAL     PLAN:  PT FREQUENCY: 2x/week  PT DURATION: 8 weeks  PLANNED INTERVENTIONS: 97110-Therapeutic exercises, 97530- Therapeutic activity, 97112- Neuromuscular re-education, 97535- Self Care, 02859- Manual therapy, [phone]- Gait training, [phone]- Aquatic Therapy, [phone]- Electrical stimulation (unattended), [phone]- Ultrasound, 02966- Ionotophoresis 4mg /ml Dexamethasone, 79439 (1-2 muscles), [phone] (3+ muscles)- Dry Needling, Patient/Family education, Balance training, Stair training, Taping, Joint mobilization, Spinal mobilization, Cryotherapy, and Moist heat  PLAN FOR NEXT SESSION: Review and progress HEP, L hip ROM, LE strengthening, manual to B gluteals, Possible trial of Aquatic.    Mliss Cummins, PT 05/14/24 1:50 PM

## 2024-05-14 ENCOUNTER — Encounter: Payer: Self-pay | Admitting: Physical Therapy

## 2024-05-14 ENCOUNTER — Ambulatory Visit: Admitting: Physical Therapy

## 2024-05-14 ENCOUNTER — Other Ambulatory Visit: Payer: Self-pay

## 2024-05-14 DIAGNOSIS — M25552 Pain in left hip: Secondary | ICD-10-CM | POA: Insufficient documentation

## 2024-05-14 DIAGNOSIS — M25551 Pain in right hip: Secondary | ICD-10-CM | POA: Insufficient documentation

## 2024-05-14 DIAGNOSIS — M6281 Muscle weakness (generalized): Secondary | ICD-10-CM | POA: Insufficient documentation

## 2024-05-21 ENCOUNTER — Encounter: Payer: Self-pay | Admitting: Allergy

## 2024-05-21 ENCOUNTER — Ambulatory Visit: Admitting: Allergy

## 2024-05-21 ENCOUNTER — Other Ambulatory Visit: Payer: Self-pay

## 2024-05-21 VITALS — BP 100/56 | HR 66 | Temp 98.1°F | Resp 19

## 2024-05-21 DIAGNOSIS — J310 Chronic rhinitis: Secondary | ICD-10-CM

## 2024-05-21 DIAGNOSIS — J4489 Other specified chronic obstructive pulmonary disease: Secondary | ICD-10-CM

## 2024-05-21 DIAGNOSIS — K219 Gastro-esophageal reflux disease without esophagitis: Secondary | ICD-10-CM | POA: Diagnosis not present

## 2024-05-21 DIAGNOSIS — T7840XD Allergy, unspecified, subsequent encounter: Secondary | ICD-10-CM | POA: Diagnosis not present

## 2024-05-21 NOTE — Patient Instructions (Addendum)
 Allergic reactions  Environmental allergies Asthma GERD  - Continue avoidance measures for tree pollen, weed pollen, dust mites, cat dander, dog dander, rodents - Food allergy testing was negative.  Continue to avoid that specific salmon rub.   - Continue Zyrtec  10mg  daily as needed for allergy symptom control.  Recommend daily dosing during pollen season.  - Continue Singulair  10mg  daily at bedtime. - Use Flonase  2 sprays each nostril daily for 1-2 weeks at a time before stopping once nasal congestion improves for maximum benefit - Use nasal Astelin  2 sprays each nostril twice a day for nasal drainage control.   With using nasal sprays point tip of bottle toward eye on same side nostril and lean head slightly forward for best technique.   - Have access to self-injectable epinephrine  (Epipen ) 0.3mg  at all times - Follow emergency action plan in case of allergic reaction - Continue Symbicort  inhaler at current dose as directed by your pulmonologist - Use Protonix  40mg  daily for reflux control  Follow-up in 6 months or sooner if needed

## 2024-05-21 NOTE — Progress Notes (Signed)
 Follow-up Note  RE: [name]  [name] MRN: [id number] DOB: 08-03-1935 Date of Office Visit: 05/21/2024   History of present illness: Barbara  F [name] is a 88 y.o. female presenting today for follow-up of allergic reactions, allergic rhinitis, asthma and GERD.  She was last seen in the office on 01/23/2024 by myself. Discussed the use of AI scribe software for clinical note transcription with the patient, who gave verbal consent to proceed.  Her asthma has been well-controlled over the past three to four months, with a decreased need for her albuterol  inhaler or nebulizer use. She continues to use Symbicort  and Singulair  as her maintenance medication and has not required urgent care or emergency department visits for breathing issues since August.  Her allergy symptoms have also been well-managed since August. She actively avoids known triggers, including a specific salmon seasoning rub that previously caused a severe reaction. She is not currently taking Zyrtec  but continues to take montelukast  daily and plans to use Zyrtec  as needed, especially in preparation for the upcoming spring pollen season.  Environmental allergy testing was positive for dust mites, cat, dog, rodent, tree pollen, and weed pollen, indicating both seasonal and year-round allergies. Her tryptase level was normal, and food allergy tests for mint, paprika, rice, mushroom, and red meat were negative. She has an EpiPen  device that was refilled over the summer.       Review of systems: 10pt ROS negative unless noted in HPI  Past medical/social/surgical/family history have been reviewed and are unchanged unless specifically indicated below.  No changes  Medication List: Current Outpatient Medications  Medication Sig Dispense Refill   acetaminophen  (TYLENOL ) 500 MG tablet Take 1-2 tablets (500-1,000 mg total) by mouth every 6 (six) hours as needed for headache. 30 tablet 0   albuterol  (PROVENTIL ) (2.5 MG/3ML) 0.083%  nebulizer solution Take 3 mLs (2.5 mg total) by nebulization every 6 (six) hours as needed for wheezing or shortness of breath. 75 mL 12   albuterol  (VENTOLIN  HFA) 108 (90 Base) MCG/ACT inhaler INHALE TWO PUFFS BY MOUTH INTO LUNGS EVERY SIX HOURS AS NEEDED FOR WHEEZING AND/OR SHORTNESS OF BREATH 8.5 g 2   azelastine  (ASTELIN ) 0.1 % nasal spray Place 2 sprays into both nostrils 2 (two) times daily. 30 mL 12   benzonatate  (TESSALON ) 100 MG capsule Take 1 capsule (100 mg total) by mouth 3 (three) times daily as needed for cough. 30 capsule 0   cetirizine  (ZYRTEC  ALLERGY) 10 MG tablet Take 1 tablet (10 mg total) by mouth at bedtime. 90 tablet 3   cholecalciferol (VITAMIN D3) 25 MCG (1000 UNIT) tablet Take 1,000 Units by mouth daily.     clopidogrel  (PLAVIX ) 75 MG tablet Take 1 tablet (75 mg total) by mouth daily. 90 tablet 0   cycloSPORINE  (RESTASIS ) 0.05 % ophthalmic emulsion Place 1 drop into both eyes 2 (two) times daily.     EPINEPHrine  0.3 mg/0.3 mL IJ SOAJ injection Inject 0.3 mg into the muscle once as needed (for a severe allergic reaction/use as directed). 1 each 1   fluticasone  (FLONASE ) 50 MCG/ACT nasal spray Place 2 sprays into both nostrils 2 (two) times daily. 18.2 mL 11   hydrochlorothiazide  (MICROZIDE ) 12.5 MG capsule Take 1 capsule (12.5 mg total) by mouth daily. 90 capsule 0   HYDROcodone  bit-homatropine (HYCODAN) 5-1.5 MG/5ML syrup Take 5 mLs by mouth every 6 (six) hours as needed for cough. 240 mL 0   linaclotide  (LINZESS ) 72 MCG capsule Take 1 capsule orally once daily at least  30 minutes prior to a meal, at approximately the same time each day 30 capsule 0   losartan  (COZAAR ) 50 MG tablet Take 50 mg by mouth daily.     montelukast  (SINGULAIR ) 10 MG tablet Take 1 tablet (10 mg total) by mouth at bedtime. 90 tablet 3   Multiple Vitamin (MULTIVITAMIN) tablet Take 1 tablet by mouth daily.      ondansetron  (ZOFRAN -ODT) 4 MG disintegrating tablet Take 1 tablet (4 mg total) by mouth every  8 (eight) hours as needed for nausea or vomiting. 20 tablet 0   pantoprazole  (PROTONIX ) 40 MG tablet Take 1 tablet (40 mg total) by mouth 2 (two) times daily before a meal. 180 tablet 3   Probiotic Product (PROBIOTIC PO) Take 1 capsule by mouth daily.      Propylene Glycol (SYSTANE BALANCE) 0.6 % SOLN Place 1 drop into both eyes 4 (four) times daily as needed (dry eyes).     Respiratory Therapy Supplies (FLUTTER) DEVI Use device 2-3 times a day to break up congestion 1 each 0   rosuvastatin (CRESTOR) 10 MG tablet Take 10 mg by mouth at bedtime.     Spacer/Aero-Holding Chambers (AEROCHAMBER MV) inhaler Use as instructed 1 each 0   SYMBICORT  80-4.5 MCG/ACT inhaler Inhale 2 puffs into the lungs 2 (two) times daily. 30.6 g 3   No current facility-administered medications for this visit.     Known medication allergies: Allergies  Allergen Reactions   Cepacol Shortness Of Breath    Difficulty breathing   Contrast Media [Iodinated Contrast Media] Shortness Of Breath and Swelling    Pt reports I could not breath at all and my face swelled up    Iodine Shortness Of Breath   Lactose Intolerance (Gi) Shortness Of Breath    All dairy  Causes shortness of breath   Mushroom Extract Complex (Obsolete) Shortness Of Breath   Other Hives, Shortness Of Breath and Swelling    Mushrooms (All mushrooms)   Penicillins Shortness Of Breath and Swelling    DID THE REACTION INVOLVE: Swelling of the face/tongue/throat, SOB, or low BP? Yes  Sudden or severe rash/hives, skin peeling, or the inside of the mouth or nose? No Did it require medical treatment? No When did it last happen? Over 10 years ago   If all above answers are "NO", may proceed with cephalosporin use.    Sulfamethoxazole Anaphylaxis   Aspirin  Other (See Comments)    Burns stomach up   Ciprofloxacin Other (See Comments)    Other reaction(s): Unknown   Budesonide  Other (See Comments)    Headache     Physical examination: Blood  pressure (!) 100/56, pulse 66, temperature 98.1 F (36.7 C), resp. rate 19, SpO2 96%.  General: Alert, interactive, in no acute distress. HEENT: PERRLA, TMs pearly gray, turbinates non-edematous without discharge, post-pharynx non erythematous. Neck: Supple without lymphadenopathy. Lungs: Clear to auscultation without wheezing, rhonchi or rales. {no increased work of breathing. CV: Normal S1, S2 without murmurs. Abdomen: Nondistended, nontender. Skin: Warm and dry, without lesions or rashes. Extremities:  No clubbing, cyanosis or edema. Neuro:   Grossly intact.  Diagnostics/Labs: Labs:  Component     Latest Ref Rng 01/23/2024  D Pteronyssinus IgE     Class III kU/L 1.58 !   D Farinae IgE     Class III kU/L 1.74 !   Cat Dander IgE     Class 0/I kU/L 0.20 !   Dog Dander IgE     Class IV kU/L  12.00 !   Mouse Urine IgE     Class 0/I kU/L 0.13 !   [place] Grass IgE     Class 0 kU/L <0.10   Timothy Grass IgE     Class 0 kU/L <0.10   [place] Grass IgE     Class 0 kU/L <0.10   Cockroach, German IgE     Class 0 kU/L <0.10   Penicillium Chrysogen IgE     Class 0 kU/L <0.10   Cladosporium Herbarum IgE     Class 0 kU/L <0.10   Aspergillus Fumigatus IgE     Class 0 kU/L <0.10   Alternaria Alternata IgE     Class 0 kU/L <0.10   Maple/Box Elder IgE     Class 0/I kU/L 0.31 !   Common Silver [place]rie IgE     Class IV kU/L 14.10 !   [place], [place] IgE     Class 0/I kU/L 0.26 !   [place], [name] IgE     Class III kU/L 3.37 !   Elm, American IgE     Class I kU/L 0.39 !   [place] IgE     Class I kU/L 0.48 !   Pecan, [place] IgE     Class II kU/L 0.71 !   White Mulberry IgE     Class 0 kU/L <0.10   Ragweed, Short IgE     Class IV kU/L 6.04 !   Pigweed, Rough IgE     Class 0 kU/L <0.10   Sheep Sorrel IgE Qn     Class 0 kU/L <0.10   Class Description Allergens Comment   IgE (Immunoglobulin E), Serum     6 - 495 IU/mL 103   Pork IgE     Class 0 kU/L <0.10   Beef IgE     Class  0 kU/L <0.10   Allergen Lamb IgE     Class 0 kU/L <0.10   O215-IgE Alpha-Gal     Class 0 kU/L <0.10   Tryptase     2.2 - 13.2 ug/L 5.8   Paprika IgE     Class 0 kU/L <0.10   Mint IgE     Class 0 kU/L <0.10   Allergen Rice IgE     Class 0 kU/L <0.10   Mushroom IgE     Class 0 kU/L <0.10      Spirometry: FEV1: 0.97L 78%, FVC: 2.35L 144%, ratio consistent with nonobstructive pattern  Assessment and plan: Allergic reactions  Environmental allergies Asthma GERD  - Continue avoidance measures for tree pollen, weed pollen, dust mites, cat dander, dog dander, rodents - Food allergy testing was negative.  Continue to avoid that specific salmon rub.   - Continue Zyrtec  10mg  daily as needed for allergy symptom control.  Recommend daily dosing during pollen season.  - Continue Singulair  10mg  daily at bedtime. - Use Flonase  2 sprays each nostril daily for 1-2 weeks at a time before stopping once nasal congestion improves for maximum benefit - Use nasal Astelin  2 sprays each nostril twice a day for nasal drainage control.   With using nasal sprays point tip of bottle toward eye on same side nostril and lean head slightly forward for best technique.   - Have access to self-injectable epinephrine  (Epipen ) 0.3mg  at all times - Follow emergency action plan in case of allergic reaction - Continue Symbicort  inhaler at current dose as directed by your pulmonologist - Use Protonix  40mg  daily for reflux control  Follow-up in 6  months or sooner if needed  I appreciate the opportunity to take part in Cyndee 's care. Please do not hesitate to contact me with questions.  Sincerely,   Danita Brain, MD Allergy/Immunology [facility]

## 2024-05-23 ENCOUNTER — Ambulatory Visit: Attending: Family Medicine | Admitting: Physical Therapy

## 2024-05-27 ENCOUNTER — Ambulatory Visit (INDEPENDENT_AMBULATORY_CARE_PROVIDER_SITE_OTHER): Admitting: Family Medicine

## 2024-05-27 ENCOUNTER — Encounter: Payer: Self-pay | Admitting: Family Medicine

## 2024-05-27 VITALS — BP 121/66 | HR 81 | Temp 97.7°F | Ht 59.5 in | Wt 126.0 lb

## 2024-05-27 DIAGNOSIS — I10 Essential (primary) hypertension: Secondary | ICD-10-CM

## 2024-05-27 DIAGNOSIS — K219 Gastro-esophageal reflux disease without esophagitis: Secondary | ICD-10-CM

## 2024-05-27 MED ORDER — LOSARTAN POTASSIUM 50 MG PO TABS: 50.0000 mg | ORAL_TABLET | Freq: Every day | ORAL | 3 refills | Status: AC

## 2024-05-27 MED ORDER — ONDANSETRON 4 MG PO TBDP: 4.0000 mg | ORAL_TABLET | Freq: Three times a day (TID) | ORAL | 0 refills | Status: AC | PRN

## 2024-05-27 MED ORDER — FAMOTIDINE 40 MG PO TABS: 40.0000 mg | ORAL_TABLET | Freq: Every day | ORAL | 3 refills | Status: AC

## 2024-05-27 NOTE — Progress Notes (Signed)
 Assessment & Plan   Assessment/Plan:   Assessment and Plan Assessment & Plan Essential hypertension Blood pressure is well-controlled at 121/66 mmHg with losartan  50 mg daily. Previous elevated readings were likely stress-related. Hydrochlorothiazide  was discontinued due to hypotension. - Continue losartan  50 mg daily - Discontinued hydrochlorothiazide  - Check blood pressure regularly - Follow up in 3 months for blood pressure check  Gastroesophageal reflux disease with esophagitis Persistent GERD symptoms with nocturnal heartburn and esophageal inflammation causing sore throat. Current treatment with pantoprazole  40 mg BID is continued. Famotidine  40 mg at night is added for additional symptom control. Discussed low risk of esophageal cancer with current treatment and follow-up with GI specialist. - Continue pantoprazole  40 mg BID - Add famotidine  40 mg at bedtime - Follow up with GI specialist for ongoing GERD symptoms - Take ondansetron  4 mg every 8 hours as needed for nausea and bloating        Medications Discontinued During This Encounter  Medication Reason   hydrochlorothiazide  (MICROZIDE ) 12.5 MG capsule    losartan  (COZAAR ) 50 MG tablet Reorder   ondansetron  (ZOFRAN -ODT) 4 MG disintegrating tablet Reorder    Return in about 3 months (around 08/25/2024) for BP.        Subjective:   Encounter date: 05/27/2024  [name] is a 88 y.o. female who has Allergic rhinitis; Intrinsic asthma; Laryngopharyngeal reflux disease; CHOLECYSTECTOMY, HX OF; Family history of malignant neoplasm of gastrointestinal tract; History of colonic polyps; Stricture and stenosis of esophagus; TIA (transient ischemic attack); HLD (hyperlipidemia); Headache on top of head; Essential hypertension; Neck pain; Low back pain; Pleurisy; Dyslipidemia; Chest pain; Abnormal EKG; Myalgia; Thrush; Palpitations; Keratoconjunctivitis sicca of both eyes not due to Sjogren's syndrome; Bilateral  sciatica; Anemia; Leucopenia; Duffy-Null Associated Neutrophil Count [facility]); Stenosing tenosynovitis; CKD stage 3b, GFR 30-44 ml/min (HCC); Postmenopausal estrogen deficiency; Anaphylactic syndrome; Age-related osteoporosis without current pathological fracture; Asthmatic bronchitis , chronic (HCC); PAD (peripheral artery disease); Prediabetes; Gastroesophageal reflux disease without esophagitis; Long term (current) use of anticoagulants; Severe persistent asthma without complication (HCC); and Bilateral primary osteoarthritis of hip on their problem list..   She  has a past medical history of Arthritis, Asthma, Blood clotting disorder, Cataract, COPD (chronic obstructive pulmonary disease) (HCC), Depression, Diverticulosis (10-2011), GERD (gastroesophageal reflux disease), Hemorrhoids, internal, colonic polyp, Hyperlipemia, Hypertension, IBS (irritable bowel syndrome), Stricture of esophagus (10-2011), and TIA (transient ischemic attack)..  Discussed the use of AI scribe software for clinical note transcription with the patient, who gave verbal consent to proceed.  History of Present Illness [name] is an 88 year old female with hypertension who presents for a blood pressure follow-up.  Hypertension - Blood pressure stable at home with recent readings of 121/66 mmHg, improved from previous readings of 157/66 mmHg - Currently taking losartan  50 mg daily - Previously discontinued hydrochlorothiazide  12.5 mg daily due to low blood pressure - Feels better when her son is doing well  Gastroesophageal reflux symptoms - Persistent reflux, particularly at night around 3 AM - Associated esophageal inflammation and sore throat - Currently taking pantoprazole  40 mg twice daily - Reflux remains bothersome despite current treatment  Gastrointestinal symptoms - Occasional use of ondansetron  4 mg as needed for nausea and bloating, which is effective during flare-ups - No abdominal soreness -  Reports gas and burning sensations  ROS  Past Surgical History:  Procedure Laterality Date   CESAREAN SECTION  1962   LAPAROSCOPIC CHOLECYSTECTOMY  1995    Current Outpatient Medications on File Prior to  Visit  Medication Sig Dispense Refill   acetaminophen  (TYLENOL ) 500 MG tablet Take 1-2 tablets (500-1,000 mg total) by mouth every 6 (six) hours as needed for headache. 30 tablet 0   albuterol  (PROVENTIL ) (2.5 MG/3ML) 0.083% nebulizer solution Take 3 mLs (2.5 mg total) by nebulization every 6 (six) hours as needed for wheezing or shortness of breath. 75 mL 12   albuterol  (VENTOLIN  HFA) 108 (90 Base) MCG/ACT inhaler INHALE TWO PUFFS BY MOUTH INTO LUNGS EVERY SIX HOURS AS NEEDED FOR WHEEZING AND/OR SHORTNESS OF BREATH 8.5 g 2   azelastine  (ASTELIN ) 0.1 % nasal spray Place 2 sprays into both nostrils 2 (two) times daily. 30 mL 12   benzonatate  (TESSALON ) 100 MG capsule Take 1 capsule (100 mg total) by mouth 3 (three) times daily as needed for cough. 30 capsule 0   cetirizine  (ZYRTEC  ALLERGY) 10 MG tablet Take 1 tablet (10 mg total) by mouth at bedtime. 90 tablet 3   cholecalciferol (VITAMIN D3) 25 MCG (1000 UNIT) tablet Take 1,000 Units by mouth daily.     clopidogrel  (PLAVIX ) 75 MG tablet Take 1 tablet (75 mg total) by mouth daily. 90 tablet 0   cycloSPORINE  (RESTASIS ) 0.05 % ophthalmic emulsion Place 1 drop into both eyes 2 (two) times daily.     EPINEPHrine  0.3 mg/0.3 mL IJ SOAJ injection Inject 0.3 mg into the muscle once as needed (for a severe allergic reaction/use as directed). 1 each 1   fluticasone  (FLONASE ) 50 MCG/ACT nasal spray Place 2 sprays into both nostrils 2 (two) times daily. 18.2 mL 11   HYDROcodone  bit-homatropine (HYCODAN) 5-1.5 MG/5ML syrup Take 5 mLs by mouth every 6 (six) hours as needed for cough. 240 mL 0   linaclotide  (LINZESS ) 72 MCG capsule Take 1 capsule orally once daily at least 30 minutes prior to a meal, at approximately the same time each day 30 capsule 0    montelukast  (SINGULAIR ) 10 MG tablet Take 1 tablet (10 mg total) by mouth at bedtime. 90 tablet 3   Multiple Vitamin (MULTIVITAMIN) tablet Take 1 tablet by mouth daily.      pantoprazole  (PROTONIX ) 40 MG tablet Take 1 tablet (40 mg total) by mouth 2 (two) times daily before a meal. 180 tablet 3   Probiotic Product (PROBIOTIC PO) Take 1 capsule by mouth daily.      Propylene Glycol (SYSTANE BALANCE) 0.6 % SOLN Place 1 drop into both eyes 4 (four) times daily as needed (dry eyes).     Respiratory Therapy Supplies (FLUTTER) DEVI Use device 2-3 times a day to break up congestion 1 each 0   rosuvastatin (CRESTOR) 10 MG tablet Take 10 mg by mouth at bedtime.     Spacer/Aero-Holding Chambers (AEROCHAMBER MV) inhaler Use as instructed 1 each 0   SYMBICORT  80-4.5 MCG/ACT inhaler Inhale 2 puffs into the lungs 2 (two) times daily. 30.6 g 3   No current facility-administered medications on file prior to visit.    Family History  Problem Relation Age of Onset   Heart disease Mother    Hypertension Mother    Diabetes Mother    Asthma Father    Colon cancer Sister 69   Irritable bowel syndrome Sister    Breast cancer Other        Niece   Stomach cancer Neg Hx     Social History   Socioeconomic History   Marital status: Widowed    Spouse name: Not on file   Number of children: 1   Years  of education: college   Highest education level: Not on file  Occupational History   Occupation: retired  Tobacco Use   Smoking status: Former    Current packs/day: 0.00    Average packs/day: 0.5 packs/day for 4.3 years (2.1 ttl pk-yrs)    Types: Cigarettes    Start date: 51    Quit date: 09/28/1959    Years since quitting: 64.7    Passive exposure: Never   Smokeless tobacco: Never  Vaping Use   Vaping status: Never Used  Substance and Sexual Activity   Alcohol  use: No    Alcohol /week: 0.0 standard drinks of alcohol    Drug use: No   Sexual activity: Not Currently  Other Topics Concern   Not on  file  Social History Narrative   Patient is widowed with one child.   Patient is right handed.   Patient has college education.   Patient does not drink caffeine.   Social Drivers of Corporate Investment Banker Strain: Not on file  Food Insecurity: Low Risk (06/08/2023)   Received from [facility]   Hunger Vital Sign    Within the past 12 months, you worried that your food would run out before you got money to buy more: Never true    Within the past 12 months, the food you bought just didn't last and you didn't have money to get more. : Never true  Recent Concern: Food Insecurity - Medium Risk (05/18/2023)   Received from [facility]   Hunger Vital Sign    Worried About Running Out of Food in the Last Year: Patient declined to answer    Ran Out of Food in the Last Year: Sometimes true  Transportation Needs: No Transportation Needs (06/08/2023)   Received from [facility]    In the past 12 months, has lack of reliable transportation kept you from medical appointments, meetings, work or from getting things needed for daily living? : No  Physical Activity: Not on file  Stress: Not on file  Social Connections: Not on file  Intimate Partner Violence: Not At Risk (04/27/2023)   Humiliation, Afraid, Rape, and Kick questionnaire    Fear of Current or Ex-Partner: No    Emotionally Abused: No    Physically Abused: No    Sexually Abused: No                                                                                                  Objective:  Physical Exam: BP 121/66   Pulse 81   Temp 97.7 F (36.5 C)   Ht 4' 11.5 (1.511 m)   Wt 126 lb (57.2 kg)   SpO2 99%   BMI 25.02 kg/m    Physical Exam Constitutional:      General: She is not in acute distress.    Appearance: Normal appearance. She is not ill-appearing or toxic-appearing.  HENT:     Head: Normocephalic and atraumatic.     Nose: Nose normal. No congestion.  Eyes:     General: No scleral  icterus.    Extraocular Movements:  Extraocular movements intact.  Cardiovascular:     Rate and Rhythm: Normal rate and regular rhythm.     Pulses: Normal pulses.     Heart sounds: Normal heart sounds.  Pulmonary:     Effort: Pulmonary effort is normal. No respiratory distress.     Breath sounds: Normal breath sounds.  Abdominal:     General: Abdomen is flat. Bowel sounds are normal.     Palpations: Abdomen is soft.  Musculoskeletal:        General: Normal range of motion.  Lymphadenopathy:     Cervical: No cervical adenopathy.  Skin:    General: Skin is warm and dry.     Findings: No rash.  Neurological:     General: No focal deficit present.     Mental Status: She is alert and oriented to person, place, and time. Mental status is at baseline.  Psychiatric:        Mood and Affect: Mood normal.        Behavior: Behavior normal.        Thought Content: Thought content normal.        Judgment: Judgment normal.     DG HIP UNILAT W OR W/O PELVIS 2-3 VIEWS LEFT Result Date: 04/28/2024 EXAM: 2 or 3 VIEW(S) XRAY OF THE PELVIS AND BOTH HIPS 04/28/2024 11:41:57 AM COMPARISON: None available. CLINICAL HISTORY: Left Hip Pain. FINDINGS: BONES: Bones are demineralized. No evidence of acute fracture, dislocation or osteonecrosis. JOINTS: SI joints are symmetric. The hip joint spaces are preserved. LUMBAR SPINE: Mild lower lumbar spondylosis noted. SOFT TISSUES: Mildly prominent stool within the distal colon with scattered vascular calcifications. The soft tissues otherwise appear unremarkable. IMPRESSION: 1. No acute fracture or dislocation of the hips. 2. No evidence of osteonecrosis. Electronically signed by: Elsie Perone MD 04/28/2024 12:32 PM EST RP Workstation: [id number]   DG HIP UNILAT W OR W/O PELVIS 2-3 VIEWS RIGHT Result Date: 04/28/2024 EXAM: 2 or 3 VIEW(S) XRAY OF THE PELVIS AND BOTH HIPS 04/28/2024 11:41:57 AM COMPARISON: None available. CLINICAL HISTORY: Left Hip Pain. FINDINGS:  BONES: Bones are demineralized. No evidence of acute fracture, dislocation or osteonecrosis. JOINTS: SI joints are symmetric. The hip joint spaces are preserved. LUMBAR SPINE: Mild lower lumbar spondylosis noted. SOFT TISSUES: Mildly prominent stool within the distal colon with scattered vascular calcifications. The soft tissues otherwise appear unremarkable. IMPRESSION: 1. No acute fracture or dislocation of the hips. 2. No evidence of osteonecrosis. Electronically signed by: Elsie Perone MD 04/28/2024 12:32 PM EST RP Workstation: [id number]    Recent Results (from the past 2160 hours)  POC SARS Coronavirus 2 Ag     Status: None   Collection Time: 03/14/24 10:41 AM  Result Value Ref Range   SARS Coronavirus 2 Ag Negative Negative  Comprehensive metabolic panel     Status: Abnormal   Collection Time: 04/15/24  2:49 PM  Result Value Ref Range   Sodium 141 135 - 145 mEq/L   Potassium 3.8 3.5 - 5.1 mEq/L   Chloride 104 96 - 112 mEq/L   CO2 30 19 - 32 mEq/L    Comment: Elevated LDH levels may cause falsely increased CO2 results. If LDH is >2000 U/L, a positive bias of 12% is possible.   Glucose, Bld 115 (H) 70 - 99 mg/dL   BUN 12 6 - 23 mg/dL   Creatinine, Ser 8.95 0.40 - 1.20 mg/dL   Total Bilirubin 1.7 (H) 0.2 - 1.2 mg/dL   Alkaline Phosphatase 58 39 - 117  U/L   AST 24 0 - 37 U/L   ALT 17 0 - 35 U/L   Total Protein 7.6 6.0 - 8.3 g/dL   Albumin 4.4 3.5 - 5.2 g/dL   GFR 51.41 (L) >39.99 mL/min    Comment: Calculated using the CKD-EPI Creatinine Equation (2021)   Calcium  9.5 8.4 - 10.5 mg/dL  Lipid panel     Status: Abnormal   Collection Time: 04/15/24  2:49 PM  Result Value Ref Range   Cholesterol 198 0 - 200 mg/dL    Comment: ATP III Classification       Desirable:  < 200 mg/dL               Borderline High:  200 - 239 mg/dL          High:  > = 759 mg/dL   Triglycerides 28.9 0.0 - 149.0 mg/dL    Comment: Normal:  <849 mg/dLBorderline High:  150 - 199 mg/dL   HDL 43.09 >60.99 mg/dL    VLDL 85.7 0.0 - 59.9 mg/dL   LDL Cholesterol 872 (H) 0 - 99 mg/dL   Total CHOL/HDL Ratio 3     Comment:                Men          Women1/2 Average Risk     3.4          3.3Average Risk          5.0          4.42X Average Risk          9.6          7.13X Average Risk          15.0          11.0                       NonHDL 140.92     Comment: NOTE:  Non-HDL goal should be 30 mg/dL higher than patient's LDL goal (i.e. LDL goal of < 70 mg/dL, would have non-HDL goal of < 100 mg/dL)  Hemoglobin J8r     Status: None   Collection Time: 04/15/24  2:49 PM  Result Value Ref Range   Hgb A1c MFr Bld 5.7 4.6 - 6.5 %    Comment: Glycemic Control Guidelines for People with Diabetes:Non Diabetic:  <6%Goal of Therapy: <7%Additional Action Suggested:  >8%   Insulin , random     Status: Abnormal   Collection Time: 04/15/24  2:49 PM  Result Value Ref Range   Insulin  19.4 (H) uIU/mL    Comment:       Reference Range  < or = 18.4 .       Risk:       Optimal          < or = 18.4       Moderate         NA       High             >18.4 .       Adult cardiovascular event risk category       cut points (optimal, moderate, high)       are based on Insulin  Reference Interval       studies performed at [facility]       in 2022. SABRA Beverley KATHEE Sebastian, MD  LILLETTE Perkins  Lagle,acting as a neurosurgeon for Beverley KATHEE Hummer, MD.,have documented all relevant documentation on the behalf of Beverley KATHEE Hummer, MD.  I, Beverley KATHEE Hummer, MD, have reviewed all documentation for this visit. The documentation on 05/27/2024 for the exam, diagnosis, procedures, and orders are all accurate and complete.

## 2024-05-27 NOTE — Patient Instructions (Addendum)
 It was very nice to see you today!  VISIT SUMMARY: Today, we reviewed your blood pressure and gastrointestinal symptoms. Your blood pressure is well-controlled with your current medication. We also discussed your ongoing reflux and gastrointestinal symptoms and made some adjustments to your treatment plan.  YOUR PLAN: ESSENTIAL HYPERTENSION: Your blood pressure is well-controlled at 121/66 mmHg with your current medication. -Continue taking losartan  50 mg daily. -Discontinue hydrochlorothiazide . -Check your blood pressure regularly. -Follow up in 3 months for a blood pressure check.  GASTROESOPHAGEAL REFLUX DISEASE WITH ESOPHAGITIS: You have persistent reflux symptoms, especially at night, causing esophageal inflammation and a sore throat. -Continue taking pantoprazole  40 mg twice daily. -Add famotidine  40 mg at bedtime. -Follow up with a GI specialist for ongoing GERD symptoms. -Take ondansetron  4 mg every 8 hours as needed for nausea and bloating.  Return in about 3 months (around 08/25/2024) for BP.   Take care, Arvella Hummer, MD, MS   PLEASE NOTE:  If you had any lab tests, please let us  know if you have not heard back within a few days. You may see your results on mychart before we have a chance to review them but we will give you a call once they are reviewed by us .   If we ordered any referrals today, please let us  know if you have not heard from their office within the next week.   If you had any urgent prescriptions sent in today, please check with the pharmacy within an hour of our visit to make sure the prescription was transmitted appropriately.   Please try these tips to maintain a healthy lifestyle:  Eat at least 3 REAL meals and 1-2 snacks per day.  Aim for no more than 5 hours between eating.  If you eat breakfast, please do so within one hour of getting up.   Each meal should contain half fruits/vegetables, one quarter protein, and one quarter carbs (no bigger than  a computer mouse)  Cut down on sweet beverages. This includes juice, soda, and sweet tea.   Drink at least 1 glass of water with each meal and aim for at least 8 glasses per day  Exercise at least 150 minutes every week.

## 2024-06-02 ENCOUNTER — Ambulatory Visit: Attending: Family Medicine

## 2024-06-02 DIAGNOSIS — M5459 Other low back pain: Secondary | ICD-10-CM | POA: Diagnosis present

## 2024-06-02 DIAGNOSIS — M6281 Muscle weakness (generalized): Secondary | ICD-10-CM

## 2024-06-02 DIAGNOSIS — M25552 Pain in left hip: Secondary | ICD-10-CM | POA: Insufficient documentation

## 2024-06-02 DIAGNOSIS — M25551 Pain in right hip: Secondary | ICD-10-CM | POA: Diagnosis present

## 2024-06-02 NOTE — Therapy (Signed)
 OUTPATIENT PHYSICAL THERAPY TREATMENT   Patient Name: [name]  NIKIA LEVELS MRN: [id number] DOB:04/17/1936, 88 y.o., female Today's Date: 06/02/2024  END OF SESSION:  PT End of Session - 06/02/24 1149     Visit Number 2    Date for Recertification  07/09/24    Authorization Type [employer] Medicare 16 visits 11/26-1/21/26    Authorization Time Period --    Authorization - Visit Number 2    Authorization - Number of Visits 16    Progress Note Due on Visit 20    PT Start Time 1020    PT Stop Time 1100    PT Time Calculation (min) 40 min    Activity Tolerance Patient tolerated treatment well    Behavior During Therapy WFL for tasks assessed/performed           Past Medical History:  Diagnosis Date   Arthritis    Asthma    Blood clotting disorder    Cataract    COPD (chronic obstructive pulmonary disease) (HCC)    Depression    Diverticulosis 10-2011   Colonoscopy   GERD (gastroesophageal reflux disease)    Hemorrhoids, internal    Hx of colonic polyp    Hyperlipemia    Hypertension    IBS (irritable bowel syndrome)    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   EGD   TIA (transient ischemic attack)    Past Surgical History:  Procedure Laterality Date   CESAREAN SECTION  1962   LAPAROSCOPIC CHOLECYSTECTOMY  1995   Patient Active Problem List   Diagnosis Date Noted   PAD (peripheral artery disease) 04/15/2024   Prediabetes 04/15/2024   Bilateral primary osteoarthritis of hip 04/15/2024   Asthmatic bronchitis , chronic (HCC) 12/26/2023   Age-related osteoporosis without current pathological fracture 12/13/2023   Anaphylactic syndrome 11/29/2023   Duffy-Null Associated Neutrophil Count (DNANC) 10/09/2023   Stenosing tenosynovitis 10/09/2023   CKD stage 3b, GFR 30-44 ml/min (HCC) 10/09/2023   Postmenopausal estrogen deficiency 10/09/2023   Keratoconjunctivitis sicca of both eyes not due to Sjogren's syndrome 09/11/2023   Bilateral sciatica 09/11/2023   Anemia 09/11/2023    Leucopenia 09/11/2023   Palpitations 04/28/2023   Long term (current) use of anticoagulants 02/23/2023   Myalgia 07/21/2019   Thrush 07/21/2019   Dyslipidemia 05/24/2018   Chest pain 05/24/2018   Abnormal EKG 05/24/2018   Pleurisy 09/13/2017   Severe persistent asthma without complication (HCC) 05/10/2015   Neck pain 10/21/2014   Low back pain 10/21/2014   Essential hypertension 07/15/2014   HLD (hyperlipidemia) 03/24/2014   Headache on top of head 03/24/2014   TIA (transient ischemic attack) 03/13/2014   Gastroesophageal reflux disease without esophagitis 06/25/2012   Stricture and stenosis of esophagus 12/13/2011   Family history of malignant neoplasm of gastrointestinal tract 11/01/2011   History of colonic polyps 11/01/2011   Allergic rhinitis 09/17/2008   Intrinsic asthma 09/17/2008   Laryngopharyngeal reflux disease 09/17/2008   CHOLECYSTECTOMY, HX OF 09/17/2008    PCP: Sebastian Beverley NOVAK, MD   REFERRING PROVIDER: Charles Redell LABOR, DO   REFERRING DIAG:  [id number] (ICD-10-CM) - Greater trochanteric pain syndrome of left lower extremity  M25.551 (ICD-10-CM) - Greater trochanteric pain syndrome of right lower extremity    THERAPY DIAG:  Pain of both hip joints  Muscle weakness (generalized)  Other low back pain  Rationale for Evaluation and Treatment: Rehabilitation  ONSET DATE: 2 years ago  SUBJECTIVE:   SUBJECTIVE STATEMENT: My Lt hip is bothering me today.  I am waking up  les with pain.   PERTINENT HISTORY: Osteoporosis, Chronic LBP, COPD, TIA, blood clotting disorder PAIN: 06/02/24 Are you having pain? Yes: NPRS scale: 6/10 on Lt, 0/10 Rt  Pain location: B hips L>R Pain description: sharp Aggravating factors: standing and walking Relieving factors: stretches  PRECAUTIONS: Other: osteoporosis  RED FLAGS: None   WEIGHT BEARING RESTRICTIONS: No  FALLS:  Has patient fallen in last 6 months? No  LIVING ENVIRONMENT: Lives with: lives with their  son Lives in: House/apartment Stairs: Yes stairs but she does not use them. 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  DIAGNOSTIC FINDINGS:  XR IMPRESSION: 1. No acute fracture or dislocation of the hips. 2. No evidence of osteonecrosis.  DIAGNOSTIC FINDINGS: DEXA 6/25 LEFT FEMORAL NECK: T-score: -1.9   LEFT TOTAL HIP: T-score: -1.3   RIGHT FEMORAL NECK: T-score: -2.4   RIGHT TOTAL HIP: T-score: -1.6   LEFT FOREARM (RADIUS 33%): T-score: -2.5  PATIENT SURVEYS:  LEFS  Extreme difficulty/unable (0), Quite a bit of difficulty (1), Moderate difficulty (2), Little difficulty (3), No difficulty (4)  Survey date:    Any of your usual work, housework or school activities 1  2. Usual hobbies, recreational or sporting activities 0  3. Getting into/out of the bath 4  4. Walking between rooms 1  5. Putting on socks/shoes 1  6. Squatting  0  7. Lifting an object, like a bag of groceries from the floor 0  8. Performing light activities around your home 4  9. Performing heavy activities around your home 0  10. Getting into/out of a car 3  11. Walking 2 blocks 1  12. Walking 1 mile 0  13. Going up/down 10 stairs (1 flight) 2  14. Standing for 1 hour 1  15.  sitting for 1 hour 2  16. Running on even ground 0  17. Running on uneven ground 0  18. Making sharp turns while running fast 0  19. Hopping  0  20. Rolling over in bed 1  Score total:  21/80      COGNITION: Overall cognitive status: Within functional limits for tasks assessed     SENSATION: Intermittent N/T in B leg L>R   MUSCLE LENGTH: Tightness in  B HS, piriformis   POSTURE: rounded shoulders and forward head  PALPATION: Palpation: TTP at B superolateral greater trochanter L>R, piriformis L> R, glueals L>R   LOWER EXTREMITY ROM:  Active ROM Right eval Left eval  Hip flexion    Hip extension    Hip abduction    Hip adduction    Hip internal rotation 34 25  Hip external rotation 21 13  Knee flexion    Knee extension    Ankle dorsiflexion    Ankle plantarflexion    Ankle inversion    Ankle eversion     (Blank rows = not tested)  LOWER EXTREMITY MMT:  MMT Right eval Left eval  Hip flexion 4- 4-  Hip extension    Hip abduction    Hip adduction  Hip internal rotation    Hip external rotation    Knee flexion    Knee extension 4+ 5  Ankle dorsiflexion 5 5  Ankle plantarflexion    Ankle inversion    Ankle eversion     (Blank rows = not tested)  LOWER EXTREMITY SPECIAL TESTS:  Hip special tests: Hip scouring test: positive  L ant medial  FUNCTIONAL TESTS:  5 times sit to stand: 20.64 sec used hands on first rep  GAIT: Comments: decreased cadence; narrow BOS                                                                                                                                 TREATMENT DATE:   06/02/24:  NuStep: level 5x 6 min-PT present to discuss progress  Hip IR ER stretch x10 supine Seated hip IR 2x10 bil  Seated hip ER 2x10 bil Supine bridge 5 hold x10 Supine TA activation with ball squeeze 5 hold 2x10 Supine TA activation with hip abduction: yellow band 2x10 Sidelying clam x10 each  Manual: addaday to Lt ITB, lateral quads and hamstrings and gluteals in Rt sidelying.  PT monitored for response.   05/14/24  See pt ed and HEP  If treatment provided at initial evaluation, no treatment charged due to lack of  authorization.    PATIENT EDUCATION:  Education details: PT eval findings, anticipated POC, initial HEP, and possible trial of aquatics   Person educated: Patient Education method: Explanation, Demonstration, Tactile cues, Verbal cues, and Handouts Education comprehension: verbalized understanding and returned demonstration  HOME EXERCISE PROGRAM: Access Code: [email or web address] URL: [email or web address] Date: 06/02/2024 Prepared by: Burnard  Exercises - Left hip internal rotation stretch  - 2 x daily - 7 x weekly - 1 sets - 2 reps - 30 sec hold - Seated Hip Internal Rotation AROM (Mirrored)  - 2 x daily - 7 x weekly - 2 sets - 10 reps - Supine Bridge  - 2 x daily - 7 x weekly - 1-3 sets - 10 reps - Seated Hip External Rotation AROM (Mirrored)  - 2 x daily - 7 x weekly - 1 sets - 3 reps - 30 sec hold - Supine Hip Adduction Isometric with Ball  - 1 x daily - 7 x weekly - 2 sets - 10 reps - 5 hold - Hooklying Clamshell with Resistance  - 1 x daily - 7 x weekly - 2 sets - 10 reps - Clamshell  - 1 x daily - 7 x weekly - 2 sets - 10 reps  ASSESSMENT:  CLINICAL IMPRESSION: First time follow-up after evaluation. She reports that her pain is overall improved with doing the exercises.  No pain in Rt hip today and pt is sleeping better with waking less with pain. Pt with good form with exercises and PT monitored throughout session for technique. Pt had good response to massage gun and she will consider getting one for home.  She will benefit from skilled PT to address these deficits and those listed below.   OBJECTIVE IMPAIRMENTS: Abnormal gait, decreased activity tolerance, decreased ROM, decreased strength, increased muscle spasms, impaired flexibility, postural dysfunction, and pain.   ACTIVITY LIMITATIONS: sitting, standing, squatting, sleeping, stairs, transfers, bed mobility, dressing, and locomotion level  PARTICIPATION LIMITATIONS: meal prep, cleaning, laundry, shopping, community  activity, and occupation  PERSONAL FACTORS: Age and 3+ comorbidities: osteoporosis, chronic LBP, asthma, COPD, depression and HTN are also affecting patient's functional outcome.   REHAB POTENTIAL: Good  CLINICAL DECISION MAKING: Evolving/moderate complexity  EVALUATION COMPLEXITY: Moderate   GOALS: Goals reviewed with patient? Yes  SHORT TERM GOALS: Target date: 06/11/2024   Patient will be independent with initial HEP. Baseline:  Goal status: In progress   2.  Decreased hip pain by 30% with ADLs. Baseline:  Goal status: INITIAL  3.  Patient able to sleep through the night without waking from hip pain. Baseline: walking 20% less (06/02/24) Goal status: In progress    LONG TERM GOALS: Target date: 07/09/2024   Patient will be independent with advanced/ongoing HEP to improve outcomes and carryover.  Baseline:  Goal status: INITIAL  2.  Patient will report decreased pain by 75% with ADLS. Baseline:  Goal status: INITIAL  3.  Patient will demonstrate improved functional LE strength as demonstrated by improved 5XSTS by 2-3 seconds. Baseline: 20.64 seconds Goal status: INITIAL  4.   Improved LEFS by >= 9 points showing functional improvement. Baseline: 21/80 Goal status: INITIAL     PLAN:  PT FREQUENCY: 2x/week  PT DURATION: 8 weeks  PLANNED INTERVENTIONS: 97110-Therapeutic exercises, 97530- Therapeutic activity, 97112- Neuromuscular re-education, 97535- Self Care, 02859- Manual therapy, [phone]- Gait training, [id number]- Aquatic Therapy, [phone]- Electrical stimulation (unattended), [phone]- Ultrasound, 02966- Ionotophoresis 4mg /ml Dexamethasone, 79439 (1-2 muscles), 20561 (3+ muscles)- Dry Needling, Patient/Family education, Balance training, Stair training, Taping, Joint mobilization, Spinal mobilization, Cryotherapy, and Moist heat  PLAN FOR NEXT SESSION: progress HEP, L hip ROM, LE strengthening, manual to B gluteals, Possible trial of Aquatic. Pt has one more session  this week and then lapse until early January.     Burnard Joy, PT 06/02/2024 11:51 AM

## 2024-06-04 ENCOUNTER — Ambulatory Visit: Admitting: Physical Therapy

## 2024-06-04 ENCOUNTER — Encounter: Payer: Self-pay | Admitting: Physical Therapy

## 2024-06-04 DIAGNOSIS — M25551 Pain in right hip: Secondary | ICD-10-CM

## 2024-06-04 DIAGNOSIS — M6281 Muscle weakness (generalized): Secondary | ICD-10-CM

## 2024-06-04 NOTE — Therapy (Signed)
 OUTPATIENT PHYSICAL THERAPY TREATMENT   Patient Name: [name] MRN: [id number] DOB:1936/05/22, 88 y.o., female Today's Date: 06/04/2024  END OF SESSION:  PT End of Session - 06/04/24 1447     Visit Number 3    Date for Recertification  07/09/24    Authorization Type [employer] Medicare 16 visits 11/26-1/21/26    Authorization - Visit Number 3    Authorization - Number of Visits 16    Progress Note Due on Visit 20    PT Start Time 1448    PT Stop Time 1529    PT Time Calculation (min) 41 min    Activity Tolerance Patient tolerated treatment well           Past Medical History:  Diagnosis Date   Arthritis    Asthma    Blood clotting disorder    Cataract    COPD (chronic obstructive pulmonary disease) (HCC)    Depression    Diverticulosis 10-2011   Colonoscopy   GERD (gastroesophageal reflux disease)    Hemorrhoids, internal    Hx of colonic polyp    Hyperlipemia    Hypertension    IBS (irritable bowel syndrome)    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   EGD   TIA (transient ischemic attack)    Past Surgical History:  Procedure Laterality Date   CESAREAN SECTION  1962   LAPAROSCOPIC CHOLECYSTECTOMY  1995   Patient Active Problem List   Diagnosis Date Noted   PAD (peripheral artery disease) 04/15/2024   Prediabetes 04/15/2024   Bilateral primary osteoarthritis of hip 04/15/2024   Asthmatic bronchitis , chronic (HCC) 12/26/2023   Age-related osteoporosis without current pathological fracture 12/13/2023   Anaphylactic syndrome 11/29/2023   Duffy-Null Associated Neutrophil Count (DNANC) 10/09/2023   Stenosing tenosynovitis 10/09/2023   CKD stage 3b, GFR 30-44 ml/min (HCC) 10/09/2023   Postmenopausal estrogen deficiency 10/09/2023   Keratoconjunctivitis sicca of both eyes not due to Sjogren's syndrome 09/11/2023   Bilateral sciatica 09/11/2023   Anemia 09/11/2023   Leucopenia 09/11/2023   Palpitations 04/28/2023   Long term (current) use of anticoagulants  02/23/2023   Myalgia 07/21/2019   Thrush 07/21/2019   Dyslipidemia 05/24/2018   Chest pain 05/24/2018   Abnormal EKG 05/24/2018   Pleurisy 09/13/2017   Severe persistent asthma without complication (HCC) 05/10/2015   Neck pain 10/21/2014   Low back pain 10/21/2014   Essential hypertension 07/15/2014   HLD (hyperlipidemia) 03/24/2014   Headache on top of head 03/24/2014   TIA (transient ischemic attack) 03/13/2014   Gastroesophageal reflux disease without esophagitis 06/25/2012   Stricture and stenosis of esophagus 12/13/2011   Family history of malignant neoplasm of gastrointestinal tract 11/01/2011   History of colonic polyps 11/01/2011   Allergic rhinitis 09/17/2008   Intrinsic asthma 09/17/2008   Laryngopharyngeal reflux disease 09/17/2008   CHOLECYSTECTOMY, HX OF 09/17/2008    PCP: Sebastian Beverley NOVAK, MD   REFERRING PROVIDER: Charles Redell LABOR, DO   REFERRING DIAG:  [phone] (ICD-10-CM) - Greater trochanteric pain syndrome of left lower extremity  M25.551 (ICD-10-CM) - Greater trochanteric pain syndrome of right lower extremity    THERAPY DIAG:  Pain of both hip joints  Muscle weakness (generalized)  Rationale for Evaluation and Treatment: Rehabilitation  ONSET DATE: 2 years ago  SUBJECTIVE:   SUBJECTIVE STATEMENT:  I've been up since 1 AM b/c of left hip pain.  Still hurts but not as bad as last night.  When I left here last time I felt so good!  PERTINENT HISTORY: Osteoporosis, Chronic LBP, COPD, TIA, blood clotting disorder PAIN: 06/04/24 Are you having pain? Yes: NPRS scale: 6/10 on Lt, 0/10 Rt  Pain location: B hips L>R Pain description: sharp Aggravating factors: standing and walking Relieving factors: stretches  PRECAUTIONS: Other: osteoporosis  RED FLAGS: None   WEIGHT BEARING RESTRICTIONS: No  FALLS:  Has patient fallen in last 6 months? No  LIVING ENVIRONMENT: Lives with: lives with their son Lives in: House/apartment Stairs: Yes  stairs but she does not use them. 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  DIAGNOSTIC FINDINGS:  XR IMPRESSION: 1. No acute fracture or dislocation of the hips. 2. No evidence of osteonecrosis.  DIAGNOSTIC FINDINGS: DEXA 6/25 LEFT FEMORAL NECK: T-score: -1.9   LEFT TOTAL HIP: T-score: -1.3   RIGHT FEMORAL NECK: T-score: -2.4   RIGHT TOTAL HIP: T-score: -1.6   LEFT FOREARM (RADIUS 33%): T-score: -2.5  PATIENT SURVEYS:  LEFS  Extreme difficulty/unable (0), Quite a bit of difficulty (1), Moderate difficulty (2), Little difficulty (3), No difficulty (4)  Survey date:    Any of your usual work, housework or school activities 1  2. Usual hobbies, recreational or sporting activities 0  3. Getting into/out of the bath 4  4. Walking between rooms 1  5. Putting on socks/shoes 1  6. Squatting  0  7. Lifting an object, like a bag of groceries from the floor 0  8. Performing light activities around your home 4  9. Performing heavy activities around your home 0  10. Getting into/out of a car 3  11. Walking 2 blocks 1  12. Walking 1 mile 0  13. Going up/down 10 stairs (1 flight) 2  14. Standing for 1 hour 1  15.  sitting for 1 hour 2  16. Running on even ground 0  17. Running on uneven ground 0  18. Making sharp turns while running fast 0  19. Hopping  0  20. Rolling over in bed 1  Score total:  21/80     COGNITION: Overall cognitive status: Within functional  limits for tasks assessed     SENSATION: Intermittent N/T in B leg L>R   MUSCLE LENGTH: Tightness in  B HS, piriformis   POSTURE: rounded shoulders and forward head  PALPATION: Palpation: TTP at B superolateral greater trochanter L>R, piriformis L> R, glueals L>R   LOWER EXTREMITY ROM:  Active ROM Right eval Left eval  Hip flexion    Hip extension    Hip abduction    Hip adduction    Hip internal rotation 34 25  Hip external rotation 21 13  Knee flexion    Knee extension    Ankle dorsiflexion    Ankle plantarflexion    Ankle inversion    Ankle eversion     (Blank rows = not tested)  LOWER EXTREMITY MMT:  MMT Right eval Left eval  Hip flexion 4- 4-  Hip extension    Hip abduction    Hip adduction    Hip internal rotation  Hip external rotation    Knee flexion    Knee extension 4+ 5  Ankle dorsiflexion 5 5  Ankle plantarflexion    Ankle inversion    Ankle eversion     (Blank rows = not tested)  LOWER EXTREMITY SPECIAL TESTS:  Hip special tests: Hip scouring test: positive  L ant medial  FUNCTIONAL TESTS:  5 times sit to stand: 20.64 sec used hands on first rep  GAIT: Comments: decreased cadence; narrow BOS                                                                                                                                 TREATMENT DATE:   06/04/24:  NuStep: level 1x 8 min-PT present to discuss progress and plan today's session Hip IR ER stretch x20 supine Supine bridge 5 hold x10 Supine TA activation with ball squeeze 5 hold 2x10 Supine TA activation with hip abduction: red band x10 bil, then single side 5x each side Sidelying clam x10 right/left with cues to avoid trunk rotation Sit to stand from mat table 10x Side step in front of mat table 5x with cues for hip hinge to activate gluteals Manual: addaday to Lt ITB, lateral quads and hamstrings and gluteals in Rt sidelying.  PT monitored for response.   06/02/24:  NuStep: level  5x 6 min-PT present to discuss progress  Hip IR ER stretch x10 supine Seated hip IR 2x10 bil  Seated hip ER 2x10 bil Supine bridge 5 hold x10 Supine TA activation with ball squeeze 5 hold 2x10 Supine TA activation with hip abduction: yellow band 2x10 Sidelying clam x10 each  Manual: addaday to Lt ITB, lateral quads and hamstrings and gluteals in Rt sidelying.  PT monitored for response.   05/14/24  See pt ed and HEP  If treatment provided at initial evaluation, no treatment charged due to lack of authorization.    PATIENT EDUCATION:  Education details: PT eval findings, anticipated POC, initial HEP, and possible trial of aquatics   Person educated: Patient Education method: Explanation, Demonstration, Tactile cues, Verbal cues, and Handouts Education comprehension: verbalized understanding and returned demonstration  HOME EXERCISE PROGRAM: Access Code: [email or web address] URL: [email or web address] Date: 06/02/2024 Prepared by: Burnard  Exercises - Left hip internal rotation stretch  - 2 x daily - 7 x weekly - 1 sets - 2 reps - 30 sec hold - Seated Hip Internal Rotation AROM (Mirrored)  - 2 x daily - 7 x weekly - 2 sets - 10 reps - Supine Bridge  - 2 x daily - 7 x weekly - 1-3 sets - 10 reps - Seated Hip External Rotation AROM (Mirrored)  - 2 x daily - 7 x weekly - 1 sets - 3 reps - 30 sec hold - Supine Hip Adduction Isometric with Ball  - 1 x daily - 7 x weekly - 2 sets - 10 reps - 5 hold - Hooklying Clamshell with Resistance  -  1 x daily - 7 x weekly - 2 sets - 10 reps - Clamshell  - 1 x daily - 7 x weekly - 2 sets - 10 reps  ASSESSMENT:  CLINICAL IMPRESSION: The patient reports a poor night's sleep secondary to left hip pain.  She responds well to hip mobility ex's with verbal and tactile cues for optimal technique.  She also has a positive response to instrument assisted soft tissue mobilization.  Able to progress gluteal muscle strengthening ex's without pain  exacerbation.      OBJECTIVE IMPAIRMENTS: Abnormal gait, decreased activity tolerance, decreased ROM, decreased strength, increased muscle spasms, impaired flexibility, postural dysfunction, and pain.   ACTIVITY LIMITATIONS: sitting, standing, squatting, sleeping, stairs, transfers, bed mobility, dressing, and locomotion level  PARTICIPATION LIMITATIONS: meal prep, cleaning, laundry, shopping, community activity, and occupation  PERSONAL FACTORS: Age and 3+ comorbidities: osteoporosis, chronic LBP, asthma, COPD, depression and HTN are also affecting patient's functional outcome.   REHAB POTENTIAL: Good  CLINICAL DECISION MAKING: Evolving/moderate complexity  EVALUATION COMPLEXITY: Moderate   GOALS: Goals reviewed with patient? Yes  SHORT TERM GOALS: Target date: 06/11/2024   Patient will be independent with initial HEP. Baseline:  Goal status: In progress   2.  Decreased hip pain by 30% with ADLs. Baseline:  Goal status: INITIAL  3.  Patient able to sleep through the night without waking from hip pain. Baseline: walking 20% less (06/02/24) Goal status: In progress    LONG TERM GOALS: Target date: 07/09/2024   Patient will be independent with advanced/ongoing HEP to improve outcomes and carryover.  Baseline:  Goal status: INITIAL  2.  Patient will report decreased pain by 75% with ADLS. Baseline:  Goal status: INITIAL  3.  Patient will demonstrate improved functional LE strength as demonstrated by improved 5XSTS by 2-3 seconds. Baseline: 20.64 seconds Goal status: INITIAL  4.   Improved LEFS by >= 9 points showing functional improvement. Baseline: 21/80 Goal status: INITIAL     PLAN:  PT FREQUENCY: 2x/week  PT DURATION: 8 weeks  PLANNED INTERVENTIONS: 97110-Therapeutic exercises, 97530- Therapeutic activity, 97112- Neuromuscular re-education, 97535- Self Care, 02859- Manual therapy, [phone]- Gait training, [phone]- Aquatic Therapy, [phone]- Electrical  stimulation (unattended), [phone]- Ultrasound, 02966- Ionotophoresis 4mg /ml Dexamethasone, 79439 (1-2 muscles), 20561 (3+ muscles)- Dry Needling, Patient/Family education, Balance training, Stair training, Taping, Joint mobilization, Spinal mobilization, Cryotherapy, and Moist heat  PLAN FOR NEXT SESSION: progress HEP, L hip ROM, LE strengthening, manual to B gluteals, Possible trial of Aquatic. Pt has one more session this week and then lapse until early January.   Glade Pesa, PT 06/04/2024 3:34 PM Phone: [phone] Fax: [phone]

## 2024-06-06 ENCOUNTER — Encounter: Payer: Self-pay | Admitting: Primary Care

## 2024-06-06 ENCOUNTER — Ambulatory Visit: Admitting: Primary Care

## 2024-06-06 VITALS — BP 102/58 | HR 58 | Ht <= 58 in | Wt 128.0 lb

## 2024-06-06 DIAGNOSIS — J4550 Severe persistent asthma, uncomplicated: Secondary | ICD-10-CM | POA: Diagnosis not present

## 2024-06-06 DIAGNOSIS — K219 Gastro-esophageal reflux disease without esophagitis: Secondary | ICD-10-CM | POA: Diagnosis not present

## 2024-06-06 DIAGNOSIS — Z87891 Personal history of nicotine dependence: Secondary | ICD-10-CM | POA: Diagnosis not present

## 2024-06-06 NOTE — Patient Instructions (Signed)
" °  VISIT SUMMARY: Today, you came in for a follow-up visit to discuss your asthma and gastroesophageal reflux disease (GERD). Your asthma is well-controlled with your current medication, but you are experiencing significant issues with reflux, which is your main concern.  YOUR PLAN: -SEVERE PERSISTENT ASTHMA: Asthma is a condition where your airways narrow and swell, making it difficult to breathe. Your asthma is well-controlled with Symbicort , which you should continue taking twice daily. Remember to rinse your mouth after using Symbicort  to prevent thrush. You are also using your rescue inhaler, albuterol , once or twice a week, which is appropriate.   -GASTROESOPHAGEAL REFLUX DISEASE (GERD): GERD is a condition where stomach acid frequently flows back into the tube connecting your mouth and stomach, causing discomfort. You are currently taking pantoprazole  twice daily before meals and famotidine  at bedtime. We discussed the potential use of a seaweed-based supplement called Gourmet Reflux to help manage your symptoms. You have been referred to a GI specialist for ongoing management of your GERD. Please continue your current medications and try the Gourmet Reflux supplement after meals as needed. Discuss with the GI specialist about possibly stopping pantoprazole  if the supplement is effective.  INSTRUCTIONS: Please follow up with the GI specialist for ongoing management of your GERD. Continue taking your current medications as prescribed and try the Gourmet Reflux supplement after meals as needed. If you experience any new or worsening symptoms, please contact our office.    "

## 2024-06-06 NOTE — Progress Notes (Unsigned)
 "  @Patient  ID: Barbara  F [name], female    DOB: November 17, 1935, 88 y.o.   MRN: [id number]  Chief Complaint  Patient presents with   Medical Management of Chronic Issues    Pt states any way to prevent catching anything seasonal stuff last year was really hard , she has all shots they are offer thing time of year, but anything other that will help.    Referring provider: Sebastian Beverley NOVAK, MD  HPI: 88 year old female                                                                                                                                                                                                                      Former smoker PMH significant for severe persistent asthmatic bronchitis, LPR, HTN, TIA, GERD Patient of Dr. Shelah    Previous [facility] pulmonary encounter:  ROV 10/24/2023 --follow-up visit for 88 year old woman with a history of COPD/asthma with a positive bronchodilator response, impacted by both allergic rhinitis and GERD.  She has a history of thrush with multiple inhalers.  Last seen here 04/2023. She is having more allergy and exertional SOB, has been on symbicort  80, currently using albuterol  2-3x a day. Not unlike past Spring sx.  Coughs clear mucous during the day esp in the am. Not doing loratadine  right now. She is also having some occasional breakthrough GERD on protonix  every day.    ROV 03/06/2024 --88 year old woman with a history of COPD/asthma and a positive bronchodilator response, allergies, GERD and chronic cough.  She was seen twice in early July for persistent cough, progressive dyspnea, nasal congestion and drainage, treated with antibiotics and steroids. She is on symbicort  80, albuterol  a few times a week. Her GERD has been active on protonix  every day. She is on flonase , is now off of zyrtec  and singulair .    06/06/2024- Interim hx  Discussed the use of AI scribe software for clinical note transcription with the patient, who gave verbal consent to  proceed. History of Present Illness [name] is an 88 year old female with severe persistent asthma and GERD who presents for a follow-up visit.  Her asthma is well controlled with Symbicort , taken twice daily, and she uses an air chamber and rinses her mouth after use to prevent thrush. She occasionally experiences thrush and uses tea tree mouthwash to help with this. She uses her rescue inhaler, albuterol , once or twice a week.  She is experiencing significant issues with reflux, which  she identifies as her main concern. She was recently prescribed famotidine  to be taken at bedtime, in addition to continuing pantoprazole  twice daily before meals. There was a delay in obtaining the famotidine  prescription after switching pharmacies. Reflux symptoms sometimes wake her up two to three times a night, although there are nights when she sleeps through without waking. She experiences shortness of breath once or twice a week.  She has a history of high cholesterol, high blood pressure, and a transient ischemic attack (TIA). She also takes montelukast  for allergies, with a prescription that should last a year. She has Zofran  available as needed for nausea and reports no issues with bloating.  Allergies[1]  Immunization History  Administered Date(s) Administered   Fluad Quad(high Dose 65+) 03/11/2022   INFLUENZA, HIGH DOSE SEASONAL PF 05/29/2023   Influenza Inj Mdck Quad Pf 06/16/2018   Influenza,inj,Quad PF,6+ Mos 04/01/2019   Influenza,inj,Quad PF,6-35 Mos 06/03/2018   Influenza-Unspecified 04/19/2020, 03/03/2022   PFIZER Comirnaty(Gray Top)Covid-19 Tri-Sucrose Vaccine 03/28/2023   PFIZER(Purple Top)SARS-COV-2 Vaccination 09/02/2019, 09/23/2019, 10/14/2019, 04/19/2020, 05/04/2020   PPD Test 11/04/2019   Pfizer(Comirnaty)Fall Seasonal Vaccine 12 years and older 03/31/2022   Pneumococcal Conjugate-13 03/20/2016   Pneumococcal Polysaccharide-23 12/23/2004   Respiratory Syncytial Virus  Vaccine,Recomb Aduvanted(Arexvy) 04/22/2022   Td 12/23/2004   Zoster Recombinant(Shingrix) 08/28/2023    Past Medical History:  Diagnosis Date   Arthritis    Asthma    Blood clotting disorder    Cataract    COPD (chronic obstructive pulmonary disease) (HCC)    Depression    Diverticulosis 10-2011   Colonoscopy   GERD (gastroesophageal reflux disease)    Hemorrhoids, internal    Hx of colonic polyp    Hyperlipemia    Hypertension    IBS (irritable bowel syndrome)    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   EGD   TIA (transient ischemic attack)     Tobacco History: Tobacco Use History[2] Counseling given: Not Answered   Outpatient Medications Prior to Visit  Medication Sig Dispense Refill   acetaminophen  (TYLENOL ) 500 MG tablet Take 1-2 tablets (500-1,000 mg total) by mouth every 6 (six) hours as needed for headache. 30 tablet 0   albuterol  (PROVENTIL ) (2.5 MG/3ML) 0.083% nebulizer solution Take 3 mLs (2.5 mg total) by nebulization every 6 (six) hours as needed for wheezing or shortness of breath. 75 mL 12   albuterol  (VENTOLIN  HFA) 108 (90 Base) MCG/ACT inhaler INHALE TWO PUFFS BY MOUTH INTO LUNGS EVERY SIX HOURS AS NEEDED FOR WHEEZING AND/OR SHORTNESS OF BREATH 8.5 g 2   azelastine  (ASTELIN ) 0.1 % nasal spray Place 2 sprays into both nostrils 2 (two) times daily. 30 mL 12   benzonatate  (TESSALON ) 100 MG capsule Take 1 capsule (100 mg total) by mouth 3 (three) times daily as needed for cough. 30 capsule 0   cetirizine  (ZYRTEC  ALLERGY) 10 MG tablet Take 1 tablet (10 mg total) by mouth at bedtime. 90 tablet 3   cholecalciferol (VITAMIN D3) 25 MCG (1000 UNIT) tablet Take 1,000 Units by mouth daily.     clopidogrel  (PLAVIX ) 75 MG tablet Take 1 tablet (75 mg total) by mouth daily. 90 tablet 0   cycloSPORINE  (RESTASIS ) 0.05 % ophthalmic emulsion Place 1 drop into both eyes 2 (two) times daily.     EPINEPHrine  0.3 mg/0.3 mL IJ SOAJ injection Inject 0.3 mg into the muscle once as needed (for a  severe allergic reaction/use as directed). 1 each 1   famotidine  (PEPCID ) 40 MG tablet Take 1 tablet (40 mg  total) by mouth at bedtime. 90 tablet 3   fluticasone  (FLONASE ) 50 MCG/ACT nasal spray Place 2 sprays into both nostrils 2 (two) times daily. 18.2 mL 11   HYDROcodone  bit-homatropine (HYCODAN) 5-1.5 MG/5ML syrup Take 5 mLs by mouth every 6 (six) hours as needed for cough. 240 mL 0   linaclotide  (LINZESS ) 72 MCG capsule Take 1 capsule orally once daily at least 30 minutes prior to a meal, at approximately the same time each day 30 capsule 0   losartan  (COZAAR ) 50 MG tablet Take 1 tablet (50 mg total) by mouth daily. 90 tablet 3   montelukast  (SINGULAIR ) 10 MG tablet Take 1 tablet (10 mg total) by mouth at bedtime. 90 tablet 3   Multiple Vitamin (MULTIVITAMIN) tablet Take 1 tablet by mouth daily.      ondansetron  (ZOFRAN -ODT) 4 MG disintegrating tablet Take 1 tablet (4 mg total) by mouth every 8 (eight) hours as needed for nausea or vomiting. 20 tablet 0   pantoprazole  (PROTONIX ) 40 MG tablet Take 1 tablet (40 mg total) by mouth 2 (two) times daily before a meal. 180 tablet 3   Probiotic Product (PROBIOTIC PO) Take 1 capsule by mouth daily.      Propylene Glycol (SYSTANE BALANCE) 0.6 % SOLN Place 1 drop into both eyes 4 (four) times daily as needed (dry eyes).     Respiratory Therapy Supplies (FLUTTER) DEVI Use device 2-3 times a day to break up congestion 1 each 0   rosuvastatin (CRESTOR) 10 MG tablet Take 10 mg by mouth at bedtime.     Spacer/Aero-Holding Chambers (AEROCHAMBER MV) inhaler Use as instructed 1 each 0   SYMBICORT  80-4.5 MCG/ACT inhaler Inhale 2 puffs into the lungs 2 (two) times daily. 30.6 g 3   No facility-administered medications prior to visit.   Review of Systems  Review of Systems  Constitutional: Negative.   Respiratory: Negative.    Gastrointestinal:        Reflux    Physical Exam  BP (!) 102/58   Pulse (!) 58   Ht 4' 9 (1.448 m) Comment: pet pt  Wt 128  lb (58.1 kg)   SpO2 99%   BMI 27.70 kg/m  Physical Exam Constitutional:      Appearance: Normal appearance. She is well-developed.  HENT:     Head: Normocephalic and atraumatic.     Mouth/Throat:     Mouth: Mucous membranes are moist.     Pharynx: Oropharynx is clear.  Eyes:     Pupils: Pupils are equal, round, and reactive to light.  Cardiovascular:     Rate and Rhythm: Normal rate and regular rhythm.     Heart sounds: Normal heart sounds. No murmur heard. Pulmonary:     Effort: Pulmonary effort is normal. No respiratory distress.     Breath sounds: Normal breath sounds. No wheezing or rhonchi.     Comments: CTA Musculoskeletal:        General: Normal range of motion.     Cervical back: Normal range of motion and neck supple.  Skin:    General: Skin is warm and dry.     Findings: No erythema or rash.  Neurological:     General: No focal deficit present.     Mental Status: She is alert and oriented to person, place, and time. Mental status is at baseline.  Psychiatric:        Mood and Affect: Mood normal.        Behavior: Behavior normal.  Thought Content: Thought content normal.        Judgment: Judgment normal.     Lab Results:  CBC    Component Value Date/Time   WBC 6.1 11/17/2023 0040   RBC 3.96 11/17/2023 0040   HGB 12.0 11/17/2023 0040   HCT 36.1 11/17/2023 0040   PLT 227 11/17/2023 0040   MCV 91.2 11/17/2023 0040   MCH 30.3 11/17/2023 0040   MCHC 33.2 11/17/2023 0040   RDW 13.2 11/17/2023 0040   LYMPHSABS 1.2 11/17/2023 0040   MONOABS 0.8 11/17/2023 0040   EOSABS 0.1 11/17/2023 0040   BASOSABS 0.0 11/17/2023 0040    BMET    Component Value Date/Time   NA 141 04/15/2024 1449   K 3.8 04/15/2024 1449   CL 104 04/15/2024 1449   CO2 30 04/15/2024 1449   GLUCOSE 115 (H) 04/15/2024 1449   BUN 12 04/15/2024 1449   CREATININE 1.04 04/15/2024 1449   CALCIUM  9.5 04/15/2024 1449   GFRNONAA 36 (L) 11/17/2023 0040   GFRAA >60 09/23/2019 2114     BNP No results found for: BNP  ProBNP No results found for: PROBNP  Imaging: No results found.   Assessment & Plan:   1. Severe persistent asthma without complication (HCC) (Primary)  Assessment and Plan Assessment & Plan Severe persistent asthma Asthma is well-controlled with Symbicort . No respiratory complaints. Uses albuterol  rescue inhaler once or twice a week. No wheezing on examination. Occasional nocturnal awakenings attributed to reflux rather than asthma. - Continue Symbicort  daily, morning and evening. - Ensure mouth rinsing after Symbicort  use to prevent thrush. - Refilled Symbicort  prescription as needed.  Gastroesophageal reflux disease Reflux is the primary issue, causing nocturnal awakenings and occasional weakness. Currently on pantoprazole  twice daily and famotidine  at bedtime. Pantoprazole  causes shakiness. Discussed potential use of Gourmet Reflux supplement, a seaweed-based supplement, to manage symptoms. Referral to GI specialist for ongoing GERD management. - Continue pantoprazole  twice daily before meals. - Continue famotidine  at bedtime. - Provided samples of Gourmet Reflux supplement to be taken after meals as needed. - Referred to GI specialist for ongoing GERD management. - Discuss potential discontinuation of pantoprazole  with GI specialist if Gourmet Reflux is effective.    Almarie LELON Ferrari, NP 06/06/2024     [1]  Allergies Allergen Reactions   Cepacol Shortness Of Breath    Difficulty breathing   Contrast Media [Iodinated Contrast Media] Shortness Of Breath and Swelling    Pt reports I could not breath at all and my face swelled up    Iodine Shortness Of Breath   Lactose Intolerance (Gi) Shortness Of Breath    All dairy  Causes shortness of breath   Mushroom Extract Complex (Obsolete) Shortness Of Breath   Other Hives, Shortness Of Breath and Swelling    Mushrooms (All mushrooms)   Penicillins Shortness Of Breath and  Swelling    DID THE REACTION INVOLVE: Swelling of the face/tongue/throat, SOB, or low BP? Yes  Sudden or severe rash/hives, skin peeling, or the inside of the mouth or nose? No Did it require medical treatment? No When did it last happen? Over 10 years ago   If all above answers are NO, may proceed with cephalosporin use.    Sulfamethoxazole Anaphylaxis   Aspirin  Other (See Comments)    Burns stomach up   Ciprofloxacin Other (See Comments)    Other reaction(s): Unknown   Budesonide  Other (See Comments)    Headache  [2]  Social History Tobacco Use  Smoking Status Former  Current packs/day: 0.00   Average packs/day: 0.5 packs/day for 4.3 years (2.1 ttl pk-yrs)   Types: Cigarettes   Start date: 20   Quit date: 09/28/1959   Years since quitting: 64.7   Passive exposure: Never  Smokeless Tobacco Never   "

## 2024-06-17 ENCOUNTER — Emergency Department (HOSPITAL_COMMUNITY)

## 2024-06-17 ENCOUNTER — Ambulatory Visit: Admitting: Physical Therapy

## 2024-06-17 ENCOUNTER — Emergency Department (HOSPITAL_COMMUNITY)
Admission: EM | Admit: 2024-06-17 | Discharge: 2024-06-18 | Disposition: A | Discharge status: Home / Self Care | Attending: Emergency Medicine | Admitting: Emergency Medicine

## 2024-06-17 ENCOUNTER — Other Ambulatory Visit: Payer: Self-pay

## 2024-06-17 DIAGNOSIS — I6782 Cerebral ischemia: Secondary | ICD-10-CM | POA: Insufficient documentation

## 2024-06-17 DIAGNOSIS — J45901 Unspecified asthma with (acute) exacerbation: Secondary | ICD-10-CM | POA: Insufficient documentation

## 2024-06-17 DIAGNOSIS — R531 Weakness: Secondary | ICD-10-CM

## 2024-06-17 DIAGNOSIS — Z7902 Long term (current) use of antithrombotics/antiplatelets: Secondary | ICD-10-CM | POA: Diagnosis not present

## 2024-06-17 DIAGNOSIS — R519 Headache, unspecified: Secondary | ICD-10-CM

## 2024-06-17 DIAGNOSIS — R42 Dizziness and giddiness: Secondary | ICD-10-CM

## 2024-06-17 DIAGNOSIS — Z7951 Long term (current) use of inhaled steroids: Secondary | ICD-10-CM | POA: Insufficient documentation

## 2024-06-17 DIAGNOSIS — R0602 Shortness of breath: Secondary | ICD-10-CM | POA: Diagnosis present

## 2024-06-17 LAB — CBC WITH DIFFERENTIAL/PLATELET
Abs Immature Granulocytes: 0.02 K/uL (ref 0.00–0.07)
Basophils Absolute: 0 K/uL (ref 0.0–0.1)
Basophils Relative: 0 %
Eosinophils Absolute: 0.1 K/uL (ref 0.0–0.5)
Eosinophils Relative: 3 %
HCT: 33.7 % — ABNORMAL LOW (ref 36.0–46.0)
Hemoglobin: 11.2 g/dL — ABNORMAL LOW (ref 12.0–15.0)
Immature Granulocytes: 1 %
Lymphocytes Relative: 33 %
Lymphs Abs: 1.4 K/uL (ref 0.7–4.0)
MCH: 30.9 pg (ref 26.0–34.0)
MCHC: 33.2 g/dL (ref 30.0–36.0)
MCV: 93.1 fL (ref 80.0–100.0)
Monocytes Absolute: 0.4 K/uL (ref 0.1–1.0)
Monocytes Relative: 10 %
Neutro Abs: 2.2 K/uL (ref 1.7–7.7)
Neutrophils Relative %: 53 %
Platelets: 251 K/uL (ref 150–400)
RBC: 3.62 MIL/uL — ABNORMAL LOW (ref 3.87–5.11)
RDW: 13.2 % (ref 11.5–15.5)
WBC: 4.1 K/uL (ref 4.0–10.5)
nRBC: 0 % (ref 0.0–0.2)

## 2024-06-17 LAB — COMPREHENSIVE METABOLIC PANEL WITH GFR
ALT: 16 U/L (ref 0–44)
AST: 24 U/L (ref 15–41)
Albumin: 3.8 g/dL (ref 3.5–5.0)
Alkaline Phosphatase: 59 U/L (ref 38–126)
Anion gap: 10 (ref 5–15)
BUN: 16 mg/dL (ref 8–23)
CO2: 26 mmol/L (ref 22–32)
Calcium: 9.2 mg/dL (ref 8.9–10.3)
Chloride: 104 mmol/L (ref 98–111)
Creatinine, Ser: 1.11 mg/dL — ABNORMAL HIGH (ref 0.44–1.00)
GFR, Estimated: 48 mL/min — ABNORMAL LOW
Glucose, Bld: 121 mg/dL — ABNORMAL HIGH (ref 70–99)
Potassium: 4.1 mmol/L (ref 3.5–5.1)
Sodium: 140 mmol/L (ref 135–145)
Total Bilirubin: 0.5 mg/dL (ref 0.0–1.2)
Total Protein: 6.8 g/dL (ref 6.5–8.1)

## 2024-06-17 LAB — RESP PANEL BY RT-PCR (RSV, FLU A&B, COVID)  RVPGX2
Influenza A by PCR: NEGATIVE
Influenza B by PCR: NEGATIVE
Resp Syncytial Virus by PCR: NEGATIVE
SARS Coronavirus 2 by RT PCR: NEGATIVE

## 2024-06-17 LAB — PRO BRAIN NATRIURETIC PEPTIDE: Pro Brain Natriuretic Peptide: 69.4 pg/mL

## 2024-06-17 LAB — TROPONIN T, HIGH SENSITIVITY: Troponin T High Sensitivity: 20 ng/L — ABNORMAL HIGH (ref 0–19)

## 2024-06-17 NOTE — Progress Notes (Signed)
 Patient ID:  [name] is a 88 y.o.  1935/11/23   female    Nausea, Headache, and Fatigue (Patient reports nausea, headache, and fatigue for the past three hours. )   New problem to me History given by patient:  Patient has been having nausea, HA, fatigue for about 3 hours. Patient states blood pressure has been elevated. Patient has been taking medication as prescribed.  Accompanied by:   self Duration: 3 hours Patient has been taking blood pressure medication Patient feels like he/he is having symptoms related to elevated blood pressure. Employee is complaining of: nausea, fatigue  Following symptoms: Headache described as mild to moderate pressure sensation around temples No disequilibrium No chest pain No shortness of breath No blurred vision No one-sided weakness No orthopnea No DOE No extremity swelling No difficulty mentating No left arm pain  Alleviating factors: Aggravating factors:   Review of Systems: All others reviewed and negative except as listed above.   Past Medical History, Past surgery History, Allergies, Social history, and Family History were reviewed and updated.    EXAM:  BP (!) 161/74 (BP Location: Left arm, Patient Position: Sitting)   Pulse 65   Temp 98.4 F (36.9 C) (Oral)   Resp 16   Ht 1.397 m (4' 7) Comment: patient reported  Wt 58.5 kg (129 lb) Comment: patient reported  SpO2 100%   BMI 29.98 kg/m    Constitutional: Well-nourished well-developed no acute distress Neuro:   A&O x 3 Pschy:  Normal mood and affect Head: Atraumatic, normocephalic Nares: Mucosal membrane pink clear rhinorrhea OP: Cobblestone mildly erythematous Ears: Good light reflex Cranial nerves: Alert and oriented x3 Speech clear/fluent good understanding PERRLA, no nystagmus/no eye deviation Face symmetrical Hearing is intact and normal Head turning and shoulder shrug normal Normal muscle tone/no atrophy Strength equal DTRs equal and  intact Negative pronator drift Negative Romberg Gait and stance normal Heel-to-toe normal Neck full range of motion no bruits neck noted CV: irRegular rate and rhythm without murmurs rubs or gallops RESP: Clear to auscultation bilaterally without wheezes rales or want rhonchi Abdomen: Soft nontender nondistended positive bowel sounds negative HSM no abdominal bruits noted Extremities: No clubbing cyanosis or edema 2+ pedal pulses Skin: No lesions, focal rashes or ulcerations   Recent Results (from the past 24 hours)  POC SARS-COV-2 SYMPTOMATIC (IDNOW)   Collection Time: 06/17/24  6:57 PM  Result Value Ref Range   SARS-COV-2 IDNOW Negative Negative   SARS IDNOW Information      A rapid, molecular diagnostic test on the IDNOW.  Negative results should be treated as presumptive and, if inconsistent with clinical symptoms should be confirmed with an alternative molecular assay.  POC Influenza A&B NAT (IDNOW)   Collection Time: 06/17/24  6:58 PM  Result Value Ref Range   Influenza A RNA Negative Negative   Influenza B RNA Negative Negative       Lab work  were reviewed and incorporated into the decision making process.  Impression/Plan 1. Hypertension, unspecified type (Primary) - ECG 12 lead  2. Fatigue, unspecified type - POC SARS-COV-2 SYMPTOMATIC (IDNOW) - POC Influenza A&B NAT (IDNOW)  3. Nausea    Orders Placed This Encounter  Procedures   POC SARS-COV-2 SYMPTOMATIC (IDNOW)   POC Influenza A&B NAT (IDNOW)   ECG 12 lead      EKG: nonspecific ST and T waves changes.    Patient has been instructed on medications, dosages, side effects, and possible interactions as associated with each  diagnosis in my impression and plan above.  2.   Patient education (verbal/handout) given on diagnosis, pathophysiology, treatment of diagnosis, side effects of medication use for treatment, restrictions while taking medication,        supportive measures such as pushing fluids,  using OTC medications,  3.  Patient was instructed on when to follow up and know that they can follow up here, with their PCP, Urgent Care, ED.  They have been instructed that if symptoms worse that should       return to the clinic, go to the nearest ED, or activate EMS.  4.  Patient was given AVS.  This was reviewed with them.  Red Flags associated with their diagnoses were reviewed and patient was educated on what to do if red flags develop.  5.  Patient agreed with plan and voiced understanding.  NO barriers to adherence perceived by myself. 6. Patient advised to go to the ED to rule out MI vs CHF vs PE vs other.

## 2024-06-17 NOTE — ED Provider Triage Note (Signed)
 Emergency Medicine Provider Triage Evaluation Note  [name] , a 88 y.o. female  was evaluated in triage.  Pt complains of lightheadedness, HA, generalized fatigue and weakenss. Reports that she was having some shortness of breath but attributes this to her asthma.  No recent fever, chest pain, or palpitations.  No trouble walking or talking.  Was seen in urgent care was sent over for further evaluation given symptoms. Reports that her lightheadedness has improved.   Review of Systems  Positive:  Negative:   Physical Exam  BP (!) 141/71 (BP Location: Right Arm)   Pulse 74   Temp 98.3 F (36.8 C) (Oral)   Resp 18   SpO2 98%  Gen:   Awake, no distress   Resp:  Normal effort  MSK:   Moves extremities without difficulty  Other:  Patient and the tori without difficulty.  Lungs are clear to auscultation bilaterally.  Cranial nerves grossly intact.  Strength intact in upper and lower bilateral extremities.  Medical Decision Making  Medically screening exam initiated at 9:25 PM.  Appropriate orders placed.  [name] was informed that the remainder of the evaluation will be completed by another provider, this initial triage assessment does not replace that evaluation, and the importance of remaining in the ED until their evaluation is complete.  Labs and imaging ordered.    Bernis Ernst, [place] 06/17/24 2127

## 2024-06-17 NOTE — ED Triage Notes (Signed)
 Quick triage note: Pt to ED from UC c/o nausea, SHOB, Generalized weakness x 6 hours. Reports hx asthma. Denies CP. UC sent pt here for further evaluation.

## 2024-06-18 ENCOUNTER — Emergency Department (HOSPITAL_COMMUNITY)

## 2024-06-18 ENCOUNTER — Ambulatory Visit: Admitting: Physical Therapy

## 2024-06-18 LAB — TROPONIN T, HIGH SENSITIVITY: Troponin T High Sensitivity: 22 ng/L — ABNORMAL HIGH (ref 0–19)

## 2024-06-18 LAB — D-DIMER, QUANTITATIVE: D-Dimer, Quant: 1.01 ug{FEU}/mL — ABNORMAL HIGH (ref 0.00–0.50)

## 2024-06-18 MED ORDER — DIPHENHYDRAMINE HCL 50 MG/ML IJ SOLN: 50.0000 mg | Freq: Once | INTRAMUSCULAR | Status: AC

## 2024-06-18 MED ORDER — DIPHENHYDRAMINE HCL 25 MG PO CAPS
50.0000 mg | ORAL_CAPSULE | Freq: Once | ORAL | Status: AC
  Administered 2024-06-18: 50 mg via ORAL
  Filled 2024-06-18: qty 2

## 2024-06-18 MED ORDER — IOHEXOL 350 MG/ML SOLN
75.0000 mL | Freq: Once | INTRAVENOUS | Status: AC | PRN
  Administered 2024-06-18: 75 mL via INTRAVENOUS

## 2024-06-18 MED ORDER — METHYLPREDNISOLONE SODIUM SUCC 40 MG IJ SOLR
40.0000 mg | Freq: Once | INTRAMUSCULAR | Status: AC
  Administered 2024-06-18: 40 mg via INTRAVENOUS
  Filled 2024-06-18: qty 1

## 2024-06-18 NOTE — ED Notes (Signed)
 Patient transported to CT

## 2024-06-18 NOTE — ED Provider Notes (Signed)
 " [place] [facility] DE[facility]MENT AT [facility] Provider Note   CSN: [id number] Arrival date & time: 06/17/24  2034     Patient presents with: Shortness of Breath and Weakness   Barbara  MOZELLE [name] is a 88 y.o. female.   Presents to the emerged department for evaluation of generalized weakness.  Patient reports that she has had a slight headache intermittently, felt lightheaded at times.  She feels anxious.  Patient does report shortness of breath, thinks she may be experiencing some problems with her asthma.  She was not experiencing any chest pain.  Patient seen at urgent care, referred to the emergency department for further evaluation.  She was tested for COVID and flu at urgent care, both negative.       Prior to Admission medications  Medication Sig Start Date End Date Taking? Authorizing Provider  acetaminophen  (TYLENOL ) 500 MG tablet Take 1-2 tablets (500-1,000 mg total) by mouth every 6 (six) hours as needed for headache. 11/29/23   Sebastian Beverley NOVAK, MD  albuterol  (PROVENTIL ) (2.5 MG/3ML) 0.083% nebulizer solution Take 3 mLs (2.5 mg total) by nebulization every 6 (six) hours as needed for wheezing or shortness of breath. 01/02/24   Meade Verdon RAMAN, MD  albuterol  (VENTOLIN  HFA) 108 [phone] Base) MCG/ACT inhaler INHALE TWO PUFFS BY MOUTH INTO LUNGS EVERY SIX HOURS AS NEEDED FOR WHEEZING AND/OR SHORTNESS OF BREATH 11/29/22   Shelah Lamar RAMAN, MD  azelastine  (ASTELIN ) 0.1 % nasal spray Place 2 sprays into both nostrils 2 (two) times daily. 11/29/23   Sebastian Beverley NOVAK, MD  benzonatate  (TESSALON ) 100 MG capsule Take 1 capsule (100 mg total) by mouth 3 (three) times daily as needed for cough. 03/14/24   Gelsey Amyx Savannah, PA-C  cetirizine  (ZYRTEC  ALLERGY) 10 MG tablet Take 1 tablet (10 mg total) by mouth at bedtime. 11/29/23 11/23/24  Sebastian Beverley NOVAK, MD  cholecalciferol (VITAMIN D3) 25 MCG (1000 UNIT) tablet Take 1,000 Units by mouth daily.    [provider]  clopidogrel  (PLAVIX )  75 MG tablet Take 1 tablet (75 mg total) by mouth daily. 01/16/24   Webb, Padonda B, FNP  cycloSPORINE  (RESTASIS ) 0.05 % ophthalmic emulsion Place 1 drop into both eyes 2 (two) times daily.    [provider]  EPINEPHrine  0.3 mg/0.3 mL IJ SOAJ injection Inject 0.3 mg into the muscle once as needed (for a severe allergic reaction/use as directed). 11/29/23   Sebastian Beverley NOVAK, MD  famotidine  (PEPCID ) 40 MG tablet Take 1 tablet (40 mg total) by mouth at bedtime. 05/27/24 05/22/25  Sebastian Beverley NOVAK, MD  fluticasone  (FLONASE ) 50 MCG/ACT nasal spray Place 2 sprays into both nostrils 2 (two) times daily. 11/29/23 11/23/24  Sebastian Beverley NOVAK, MD  HYDROcodone  bit-homatropine (HYCODAN) 5-1.5 MG/5ML syrup Take 5 mLs by mouth every 6 (six) hours as needed for cough. 01/02/24   Meade Verdon RAMAN, MD  linaclotide  (LINZESS ) 72 MCG capsule Take 1 capsule orally once daily at least 30 minutes prior to a meal, at approximately the same time each day 04/15/24   Sebastian Beverley NOVAK, MD  losartan  (COZAAR ) 50 MG tablet Take 1 tablet (50 mg total) by mouth daily. 05/27/24 05/22/25  Sebastian Beverley NOVAK, MD  montelukast  (SINGULAIR ) 10 MG tablet Take 1 tablet (10 mg total) by mouth at bedtime. 03/06/24 03/01/25  Shelah Lamar RAMAN, MD  Multiple Vitamin (MULTIVITAMIN) tablet Take 1 tablet by mouth daily.     [provider]  ondansetron  (ZOFRAN -ODT) 4 MG disintegrating tablet Take 1 tablet (  4 mg total) by mouth every 8 (eight) hours as needed for nausea or vomiting. 05/27/24   Sebastian Beverley NOVAK, MD  pantoprazole  (PROTONIX ) 40 MG tablet Take 1 tablet (40 mg total) by mouth 2 (two) times daily before a meal. 03/06/24 03/01/25  Byrum, Lamar RAMAN, MD  Probiotic Product (PROBIOTIC PO) Take 1 capsule by mouth daily.     [provider]  Propylene Glycol (SYSTANE BALANCE) 0.6 % SOLN Place 1 drop into both eyes 4 (four) times daily as needed (dry eyes). 04/29/23   Alba Sharper, MD  Respiratory Therapy Supplies (FLUTTER) DEVI Use  device 2-3 times a day to break up congestion 07/17/18   Hope Almarie ORN, NP  rosuvastatin (CRESTOR) 10 MG tablet Take 10 mg by mouth at bedtime. 03/02/24   [provider]  Spacer/Aero-Holding Chambers (AEROCHAMBER MV) inhaler Use as instructed 11/08/22   Byrum, Robert S, MD  SYMBICORT  80-4.5 MCG/ACT inhaler Inhale 2 puffs into the lungs 2 (two) times daily. 10/24/23   Shelah Lamar RAMAN, MD    Allergies: Cepacol, Contrast media [iodinated contrast media], Iodine, Lactose intolerance (gi), Mushroom extract complex (obsolete), Other, Penicillins, Sulfamethoxazole, Aspirin , Ciprofloxacin, and Budesonide     Review of Systems  Constitutional:  Positive for fatigue.  Respiratory:  Positive for shortness of breath. Negative for chest tightness.   Neurological:  Positive for light-headedness.    Updated Vital Signs BP 129/79   Pulse (!) 54   Temp 98.3 F (36.8 C)   Resp 20   SpO2 100%   Physical Exam Vitals and nursing note reviewed.  Constitutional:      General: She is not in acute distress.    Appearance: She is well-developed.  HENT:     Head: Normocephalic and atraumatic.     Mouth/Throat:     Mouth: Mucous membranes are moist.  Eyes:     General: Vision grossly intact. Gaze aligned appropriately.     Extraocular Movements: Extraocular movements intact.     Conjunctiva/sclera: Conjunctivae normal.  Cardiovascular:     Rate and Rhythm: Normal rate and regular rhythm.     Pulses: Normal pulses.     Heart sounds: Normal heart sounds, S1 normal and S2 normal. No murmur heard.    No friction rub. No gallop.  Pulmonary:     Effort: Pulmonary effort is normal. No respiratory distress.     Breath sounds: Normal breath sounds.  Abdominal:     General: Bowel sounds are normal.     Palpations: Abdomen is soft.     Tenderness: There is no abdominal tenderness. There is no guarding or rebound.     Hernia: No hernia is present.  Musculoskeletal:        General: No swelling.      Cervical back: Full passive range of motion without pain, normal range of motion and neck supple. No spinous process tenderness or muscular tenderness. Normal range of motion.     Right lower leg: No edema.     Left lower leg: No edema.  Skin:    General: Skin is warm and dry.     Capillary Refill: Capillary refill takes less than 2 seconds.     Findings: No ecchymosis, erythema, rash or wound.  Neurological:     General: No focal deficit present.     Mental Status: She is alert and oriented to person, place, and time.     GCS: GCS eye subscore is 4. GCS verbal subscore is 5. GCS motor subscore is  6.     Cranial Nerves: Cranial nerves 2-12 are intact.     Sensory: Sensation is intact.     Motor: Motor function is intact.     Coordination: Coordination is intact.  Psychiatric:        Attention and Perception: Attention normal.        Mood and Affect: Mood normal.        Speech: Speech normal.        Behavior: Behavior normal.     (all labs ordered are listed, but only abnormal results are displayed) Labs Reviewed  CBC WITH DIFFERENTIAL/PLATELET - Abnormal; Notable for the following components:      Result Value   RBC 3.62 (*)    Hemoglobin 11.2 (*)    HCT 33.7 (*)    All other components within normal limits  COMPREHENSIVE METABOLIC PANEL WITH GFR - Abnormal; Notable for the following components:   Glucose, Bld 121 (*)    Creatinine, Ser 1.11 (*)    GFR, Estimated 48 (*)    All other components within normal limits  D-DIMER, QUANTITATIVE - Abnormal; Notable for the following components:   D-Dimer, Quant 1.01 (*)    All other components within normal limits  TROPONIN T, HIGH SENSITIVITY - Abnormal; Notable for the following components:   Troponin T High Sensitivity 20 (*)    All other components within normal limits  TROPONIN T, HIGH SENSITIVITY - Abnormal; Notable for the following components:   Troponin T High Sensitivity 22 (*)    All other components within normal limits   RESP PANEL BY RT-PCR (RSV, FLU A&B, COVID)  RVPGX2  PRO BRAIN NATRIURETIC PEPTIDE    EKG: EKG Interpretation Date/Time:  Tuesday June 17 2024 21:04:58 EST Ventricular Rate:  70 PR Interval:  166 QRS Duration:  78 QT Interval:  374 QTC Calculation: 403 R Axis:   111  Text Interpretation: ** Suspect arm lead reversal, interpretation assumes no reversal Unusual P axis, possible ectopic atrial rhythm Right axis deviation Low voltage QRS Cannot rule out Anterior infarct , age undetermined Abnormal ECG When compared with ECG of 17-Nov-2023 00:48, No acute changes Confirmed by Haze Lonni PARAS [phone]) on 06/17/2024 11:56:39 PM  Radiology: DG Chest 2 View Result Date: 06/17/2024 EXAM: 2 VIEW(S) XRAY OF THE CHEST 06/17/2024 09:34:00 PM COMPARISON: 01/02/2024 CLINICAL HISTORY: sob FINDINGS: LUNGS AND PLEURA: Hyperinflated lungs suggesting COPD. No pleural effusion. No pneumothorax. HEART AND MEDIASTINUM: No acute abnormality of the cardiac and mediastinal silhouettes. BONES AND SOFT TISSUES: Thoracic degenerative changes. Right upper quadrant surgical clips. IMPRESSION: 1. Hyperinflated lungs suggesting COPD. Electronically signed by: Franky Stanford MD 06/17/2024 10:39 PM EST RP Workstation: [id number]   CT Head Wo Contrast Result Date: 06/17/2024 EXAM: CT HEAD WITHOUT CONTRAST 06/17/2024 10:12:47 PM TECHNIQUE: CT of the head was performed without the administration of intravenous contrast. Automated exposure control, iterative reconstruction, and/or weight based adjustment of the mA/kV was utilized to reduce the radiation dose to as low as reasonably achievable. COMPARISON: 11/17/2023 CLINICAL HISTORY: Headache, new onset (Age >= 51y) FINDINGS: BRAIN AND VENTRICLES: No acute hemorrhage. No evidence of acute infarct. No hydrocephalus. No extra-axial collection. No mass effect or midline shift. Stable mild periventricular chronic small vessel ischemia. ORBITS: No acute abnormality. SINUSES: No  acute abnormality. SOFT TISSUES AND SKULL: No acute soft tissue abnormality. No skull fracture. Skull base atherosclerosis. IMPRESSION: 1. No acute intracranial abnormality. 2. Stable mild periventricular chronic small vessel ischemia. Electronically signed by: Franky Stanford MD 06/17/2024 10:38  PM EST RP Workstation: [id number]     Procedures   Medications Ordered in the ED  diphenhydrAMINE  (BENADRYL ) capsule 50 mg (has no administration in time range)    Or  diphenhydrAMINE  (BENADRYL ) injection 50 mg (has no administration in time range)  methylPREDNISolone  sodium succinate (SOLU-MEDROL ) 40 mg/mL injection 40 mg (40 mg Intravenous Given 06/18/24 0322)                                    Medical Decision Making Amount and/or Complexity of Data Reviewed External Data Reviewed: labs, ECG and notes. Labs: ordered. Decision-making details documented in ED Course. Radiology: ordered and independent interpretation performed. Decision-making details documented in ED Course. ECG/medicine tests: ordered and independent interpretation performed. Decision-making details documented in ED Course.  Risk Prescription drug management.   Differential Diagnosis considered includes, but not limited to: Asthma exacerbation; Bronchitis; Pneumonia; CHF; ACS; PE   Presents to the emergency department for evaluation of shortness of breath.  Patient does have a history of asthma.  She is followed by pulmonology.  Patient was referred to the emergency department for further workup after she was seen at urgent care today.  At arrival she is not hypoxic.  Patient mildly hypertensive but not tachypneic or febrile.  Lung auscultation reveals good air movement without any wheezing.  Presentation could be secondary to asthma exacerbation but there is no clinical bronchospasm currently.  Patient's troponins are slightly elevated at 20, 22.  Will perform CT angiography to rule out PE.  Patient is not experiencing any  chest pain currently.  She has not had any chest pain with her atypical symptoms.  I do not feel this represents acute coronary syndrome.  Will sign with oncoming ER physician to follow-up on CT PE study.  I feel that if negative, patient can be discharged with treatment for asthma.  CRITICAL CARE Performed by: Lonni JINNY Seats   Total critical care time: 30 minutes  Critical care time was exclusive of separately billable procedures and treating other patients.  Critical care was necessary to treat or prevent imminent or life-threatening deterioration.  Critical care was time spent personally by me on the following activities: development of treatment plan with patient and/or surrogate as well as nursing, discussions with consultants, evaluation of patient's response to treatment, examination of patient, obtaining history from patient or surrogate, ordering and performing treatments and interventions, ordering and review of laboratory studies, ordering and review of radiographic studies, pulse oximetry and re-evaluation of patient's condition.      Final diagnoses:  Exacerbation of asthma, unspecified asthma severity, unspecified whether persistent    ED Discharge Orders     None          Ayven Glasco, Lonni JINNY, MD 06/18/24 [phone]  "

## 2024-06-18 NOTE — Discharge Instructions (Addendum)
 It was our pleasure to provide your ER care today - we hope that you feel better.  Drink plenty of fluids/stay well hydrated.  For recent symptoms, follow up closely with primary care doctor in the coming week.   Return to ER if worse, new symptoms, fevers, new/severe pain, recurrent or persistent chest pain, increased trouble breathing, fainting, or other concern.

## 2024-06-18 NOTE — ED Notes (Signed)
 EDP at [facility]

## 2024-06-18 NOTE — ED Notes (Signed)
 Pt. Given some applesauce and orange juice as requested

## 2024-06-18 NOTE — ED Provider Notes (Addendum)
 Signed out that CTA pending, if negative for PE d/c to home.  CTA neg for PE.   On chart review, and in speaking with patient, earlier c/o appears to be generalized weakness, nausea, mild lightheadedness and intermittent headache (I.e. symptoms do not appear c/w PE or ACS). No fevers, chills or sweats. No gu c/o. No abd/flank pain or nvd. Normal appetite. No change in speech or vision. No new focal or unilateral numbness or weakness. No problems w gait or balance. No sycnope. Earlier labs and imaging reassuring.   Currently, patient denies any chest pain or sob. Feel is breathing at baseline. Pt denies any current lightheadedness or dizziness, and patient denies any current headache.   Vitals are stable. Pt indicates feels ready for d/c. Pt currently does appear stable for ed d/c.   Rec close pcp f/u.  Return precautions provided.        Tiquan Bouch, MD 06/18/24 [phone]

## 2024-06-23 ENCOUNTER — Ambulatory Visit: Admitting: Physical Therapy

## 2024-06-23 ENCOUNTER — Ambulatory Visit: Admitting: Podiatry

## 2024-06-23 ENCOUNTER — Encounter: Payer: Self-pay | Admitting: Physical Therapy

## 2024-06-23 DIAGNOSIS — M25551 Pain in right hip: Secondary | ICD-10-CM | POA: Insufficient documentation

## 2024-06-23 DIAGNOSIS — M6281 Muscle weakness (generalized): Secondary | ICD-10-CM | POA: Diagnosis present

## 2024-06-23 DIAGNOSIS — M25552 Pain in left hip: Secondary | ICD-10-CM | POA: Diagnosis present

## 2024-06-23 NOTE — Therapy (Signed)
 " OUTPATIENT PHYSICAL THERAPY TREATMENT   Patient Name: [name] MRN: [id number] DOB:[birth year], 89 y.o., female Today's Date: 06/23/2024  END OF SESSION:  PT End of Session - 06/23/24 1018     Visit Number 4    Date for Recertification  07/09/24    Authorization Type [employer] 16 visits 11/26-1/21/26    Authorization - Visit Number 4    Authorization - Number of Visits 16    Progress Note Due on Visit 10    PT Start Time 0933    PT Stop Time 1018    PT Time Calculation (min) 45 min    Activity Tolerance Patient tolerated treatment well    Behavior During Therapy [facility] for tasks assessed/performed            Past Medical History:  Diagnosis Date   Arthritis    Asthma    Blood clotting disorder    Cataract    COPD (chronic obstructive pulmonary disease) (HCC)    Depression    Diverticulosis 10-2011   Colonoscopy   GERD (gastroesophageal reflux disease)    Hemorrhoids, internal    Hx of colonic polyp    Hyperlipemia    Hypertension    IBS (irritable bowel syndrome)    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   EGD   TIA (transient ischemic attack)    Past Surgical History:  Procedure Laterality Date   CESAREAN SECTION  1962   LAPAROSCOPIC CHOLECYSTECTOMY  1995   Patient Active Problem List   Diagnosis Date Noted   PAD (peripheral artery disease) 04/15/2024   Prediabetes 04/15/2024   Bilateral primary osteoarthritis of hip 04/15/2024   Asthmatic bronchitis , chronic (HCC) 12/26/2023   Age-related osteoporosis without current pathological fracture 12/13/2023   Anaphylactic syndrome 11/29/2023   Duffy-Null Associated Neutrophil Count (DNANC) 10/09/2023   Stenosing tenosynovitis 10/09/2023   CKD stage 3b, GFR 30-44 ml/min (HCC) 10/09/2023   Postmenopausal estrogen deficiency 10/09/2023   Keratoconjunctivitis sicca of both eyes not due to Sjogren's syndrome 09/11/2023   Bilateral sciatica 09/11/2023   Anemia 09/11/2023   Leucopenia 09/11/2023   Palpitations  04/28/2023   Long term (current) use of anticoagulants 02/23/2023   Myalgia 07/21/2019   Thrush 07/21/2019   Dyslipidemia 05/24/2018   Chest pain 05/24/2018   Abnormal EKG 05/24/2018   Pleurisy 09/13/2017   Severe persistent asthma without complication (HCC) 05/10/2015   Neck pain 10/21/2014   Low back pain 10/21/2014   Essential hypertension 07/15/2014   HLD (hyperlipidemia) 03/24/2014   Headache on top of head 03/24/2014   TIA (transient ischemic attack) 03/13/2014   Gastroesophageal reflux disease without esophagitis 06/25/2012   Stricture and stenosis of esophagus 12/13/2011   Family history of malignant neoplasm of gastrointestinal tract 11/01/2011   History of colonic polyps 11/01/2011   Allergic rhinitis 09/17/2008   Intrinsic asthma 09/17/2008   Laryngopharyngeal reflux disease 09/17/2008   CHOLECYSTECTOMY, HX OF 09/17/2008    PCP: Sebastian Beverley NOVAK, MD   REFERRING PROVIDER: Charles Redell LABOR, DO   REFERRING DIAG:  [phone] (ICD-10-CM) - Greater trochanteric pain syndrome of left lower extremity  M25.551 (ICD-10-CM) - Greater trochanteric pain syndrome of right lower extremity    THERAPY DIAG:  Pain of both hip joints  Muscle weakness (generalized)  Rationale for Evaluation and Treatment: Rehabilitation  ONSET DATE: 2 years ago  SUBJECTIVE:   SUBJECTIVE STATEMENT: Patient reports her hips are doing okay today. She is just cold right now. I felt great after last session.  PERTINENT  HISTORY: Osteoporosis, Chronic LBP, COPD, TIA, blood clotting disorder PAIN: 06/04/24 Are you having pain? Yes: NPRS scale: 6/10 on Lt, 0/10 Rt  Pain location: B hips L>R Pain description: sharp Aggravating factors: standing and walking Relieving factors: stretches  PRECAUTIONS: Other: osteoporosis  RED FLAGS: None   WEIGHT BEARING RESTRICTIONS: No  FALLS:  Has patient fallen in last 6 months? No  LIVING ENVIRONMENT: Lives with: lives with their son Lives in:  House/apartment Stairs: Yes stairs but she does not use them. 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  DIAGNOSTIC FINDINGS:  XR IMPRESSION: 1. No acute fracture or dislocation of the hips. 2. No evidence of osteonecrosis.  DIAGNOSTIC FINDINGS: DEXA 6/25 LEFT FEMORAL NECK: T-score: -1.9   LEFT TOTAL HIP: T-score: -1.3   RIGHT FEMORAL NECK: T-score: -2.4   RIGHT TOTAL HIP: T-score: -1.6   LEFT FOREARM (RADIUS 33%): T-score: -2.5  PATIENT SURVEYS:  LEFS  Extreme difficulty/unable (0), Quite a bit of difficulty (1), Moderate difficulty (2), Little difficulty (3), No difficulty (4)  Survey date:    Any of your usual work, housework or school activities 1  2. Usual hobbies, recreational or sporting activities 0  3. Getting into/out of the bath 4  4. Walking between rooms 1  5. Putting on socks/shoes 1  6. Squatting  0  7. Lifting an object, like a bag of groceries from the floor 0  8. Performing light activities around your home 4  9. Performing heavy activities around your home 0  10. Getting into/out of a car 3  11. Walking 2 blocks 1  12. Walking 1 mile 0  13. Going up/down 10 stairs (1 flight) 2  14. Standing for 1 hour 1  15.  sitting for 1 hour 2  16. Running on even ground 0  17. Running on uneven ground 0  18. Making sharp turns while running fast 0  19. Hopping  0  20. Rolling over in bed 1  Score total:  21/80     COGNITION: Overall  cognitive status: Within functional limits for tasks assessed     SENSATION: Intermittent N/T in B leg L>R   MUSCLE LENGTH: Tightness in  B HS, piriformis   POSTURE: rounded shoulders and forward head  PALPATION: Palpation: TTP at B superolateral greater trochanter L>R, piriformis L> R, glueals L>R   LOWER EXTREMITY ROM:  Active ROM Right eval Left eval  Hip flexion    Hip extension    Hip abduction    Hip adduction    Hip internal rotation 34 25  Hip external rotation 21 13  Knee flexion    Knee extension    Ankle dorsiflexion    Ankle plantarflexion    Ankle inversion    Ankle eversion     (Blank rows = not tested)  LOWER EXTREMITY MMT:  MMT Right eval Left eval  Hip flexion 4- 4-  Hip extension    Hip abduction    Hip adduction    Hip internal rotation  Hip external rotation    Knee flexion    Knee extension 4+ 5  Ankle dorsiflexion 5 5  Ankle plantarflexion    Ankle inversion    Ankle eversion     (Blank rows = not tested)  LOWER EXTREMITY SPECIAL TESTS:  Hip special tests: Hip scouring test: positive  L ant medial  FUNCTIONAL TESTS:  5 times sit to stand: 20.64 sec used hands on first rep  GAIT: Comments: decreased cadence; narrow BOS                                                                                                                                 TREATMENT DATE:   06/23/24:  NuStep: level 3x 6 min-PT present to discuss progress and plan today's session Hip IR ER stretch x10 supine Supine bridge with purple ball squeeze 2 x10 Supine TA activation with ball squeeze 5 hold 2x10 Supine TA activation with hip abduction: red band 2 x10 bil Hooklying march with red loop x 10 bilateral  Sidelying clam x10 right/left with cues to avoid trunk rotation with red loop Sit to stand from mat table 10x 5# DB hold Seated hip IR with purple ball inbetween legs 2 x 10 bilateral  Standing hip extension at counter (slight diagonal) 2 x 10  bilateral  Side stepping at counter x 3 laps Seated figure four 2 x 30 sec bilateral    06/04/24:  NuStep: level 1x 8 min-PT present to discuss progress and plan today's session Hip IR ER stretch x20 supine Supine bridge 5 hold x10 Supine TA activation with ball squeeze 5 hold 2x10 Supine TA activation with hip abduction: red band x10 bil, then single side 5x each side Sidelying clam x10 right/left with cues to avoid trunk rotation Sit to stand from mat table 10x Side step in front of mat table 5x with cues for hip hinge to activate gluteals Manual: addaday to Lt ITB, lateral quads and hamstrings and gluteals in Rt sidelying.  PT monitored for response.   06/02/24:  NuStep: level 5x 6 min-PT present to discuss progress  Hip IR ER stretch x10 supine Seated hip IR 2x10 bil  Seated hip ER 2x10 bil Supine bridge 5 hold x10 Supine TA activation with ball squeeze 5 hold 2x10 Supine TA activation with hip abduction: yellow band 2x10 Sidelying clam x10 each  Manual: addaday to Lt ITB, lateral quads and hamstrings and gluteals in Rt sidelying.  PT monitored for response.     PATIENT EDUCATION:  Education details: PT eval findings, anticipated POC, initial HEP, and possible trial of aquatics   Person educated: Patient Education method: Explanation, Demonstration, Tactile cues, Verbal cues, and Handouts Education comprehension: verbalized understanding and returned demonstration  HOME EXERCISE PROGRAM: [id number]  ASSESSMENT:  CLINICAL IMPRESSION: Patient presents with no notable hip pain currently. She verbalized poor compliance to HEP due to the holidays. She had an ED admission since last visit, but  she feels better now. Progressed exercise intensity today. Patient tolerated progressions well and did not verbalize any pain or discomfort. PT provided verbal and visual cues for proper exercise performance. Overall, patient is tolerating skilled session well. Updated HEP to include  exercise progressions    OBJECTIVE IMPAIRMENTS: Abnormal gait, decreased activity tolerance, decreased ROM, decreased strength, increased muscle spasms, impaired flexibility, postural dysfunction, and pain.   ACTIVITY LIMITATIONS: sitting, standing, squatting, sleeping, stairs, transfers, bed mobility, dressing, and locomotion level  PARTICIPATION LIMITATIONS: meal prep, cleaning, laundry, shopping, community activity, and occupation  PERSONAL FACTORS: Age and 3+ comorbidities: osteoporosis, chronic LBP, asthma, COPD, depression and HTN are also affecting patient's functional outcome.   REHAB POTENTIAL: Good  CLINICAL DECISION MAKING: Evolving/moderate complexity  EVALUATION COMPLEXITY: Moderate   GOALS: Goals reviewed with patient? Yes  SHORT TERM GOALS: Target date: 06/11/2024   Patient will be independent with initial HEP. Baseline:  Goal status: In progress   2.  Decreased hip pain by 30% with ADLs. Baseline:  Goal status: INITIAL  3.  Patient able to sleep through the night without waking from hip pain. Baseline: walking 20% less (06/02/24) Goal status: In progress    LONG TERM GOALS: Target date: 07/09/2024   Patient will be independent with advanced/ongoing HEP to improve outcomes and carryover.  Baseline:  Goal status: INITIAL  2.  Patient will report decreased pain by 75% with ADLS. Baseline:  Goal status: INITIAL  3.  Patient will demonstrate improved functional LE strength as demonstrated by improved 5XSTS by 2-3 seconds. Baseline: 20.64 seconds Goal status: INITIAL  4.   Improved LEFS by >= 9 points showing functional improvement. Baseline: 21/80 Goal status: INITIAL     PLAN:  PT FREQUENCY: 2x/week  PT DURATION: 8 weeks  PLANNED INTERVENTIONS: 97110-Therapeutic exercises, 97530- Therapeutic activity, 97112- Neuromuscular re-education, 97535- Self Care, 02859- Manual therapy, [phone]- Gait training, [phone]- Aquatic Therapy, [phone]- Electrical  stimulation (unattended), [phone]- Ultrasound, 02966- Ionotophoresis 4mg /ml Dexamethasone, 79439 (1-2 muscles), [id number] (3+ muscles)- Dry Needling, Patient/Family education, Balance training, Stair training, Taping, Joint mobilization, Spinal mobilization, Cryotherapy, and Moist heat  PLAN FOR NEXT SESSION: assess updated HEP;  L hip ROM, LE strengthening, manual to B gluteals, Possible trial of Aquatic.    Kristeen Sar, PT, DPT 06/23/2024 10:19 AM        "

## 2024-06-27 ENCOUNTER — Encounter: Payer: Self-pay | Admitting: Physical Therapy

## 2024-06-27 ENCOUNTER — Ambulatory Visit: Admitting: Physical Therapy

## 2024-06-27 DIAGNOSIS — M6281 Muscle weakness (generalized): Secondary | ICD-10-CM

## 2024-06-27 DIAGNOSIS — M25551 Pain in right hip: Secondary | ICD-10-CM

## 2024-06-27 NOTE — Therapy (Signed)
 " OUTPATIENT PHYSICAL THERAPY TREATMENT   Patient Name: [name] MRN: [id number] DOB:1936-06-08, 89 y.o., female Today's Date: 06/27/2024  END OF SESSION:  PT End of Session - 06/27/24 1019     Visit Number 5    Date for Recertification  07/09/24    Authorization Type [employer] 16 visits 11/26-1/21/26    Authorization - Visit Number 6    Authorization - Number of Visits 16    Progress Note Due on Visit 10    PT Start Time 1020    PT Stop Time 1100    PT Time Calculation (min) 40 min    Activity Tolerance Patient tolerated treatment well            Past Medical History:  Diagnosis Date   Arthritis    Asthma    Blood clotting disorder    Cataract    COPD (chronic obstructive pulmonary disease) (HCC)    Depression    Diverticulosis 10-2011   Colonoscopy   GERD (gastroesophageal reflux disease)    Hemorrhoids, internal    Hx of colonic polyp    Hyperlipemia    Hypertension    IBS (irritable bowel syndrome)    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   EGD   TIA (transient ischemic attack)    Past Surgical History:  Procedure Laterality Date   CESAREAN SECTION  1962   LAPAROSCOPIC CHOLECYSTECTOMY  1995   Patient Active Problem List   Diagnosis Date Noted   PAD (peripheral artery disease) 04/15/2024   Prediabetes 04/15/2024   Bilateral primary osteoarthritis of hip 04/15/2024   Asthmatic bronchitis , chronic (HCC) 12/26/2023   Age-related osteoporosis without current pathological fracture 12/13/2023   Anaphylactic syndrome 11/29/2023   Duffy-Null Associated Neutrophil Count (DNANC) 10/09/2023   Stenosing tenosynovitis 10/09/2023   CKD stage 3b, GFR 30-44 ml/min (HCC) 10/09/2023   Postmenopausal estrogen deficiency 10/09/2023   Keratoconjunctivitis sicca of both eyes not due to Sjogren's syndrome 09/11/2023   Bilateral sciatica 09/11/2023   Anemia 09/11/2023   Leucopenia 09/11/2023   Palpitations 04/28/2023   Long term (current) use of anticoagulants  02/23/2023   Myalgia 07/21/2019   Thrush 07/21/2019   Dyslipidemia 05/24/2018   Chest pain 05/24/2018   Abnormal EKG 05/24/2018   Pleurisy 09/13/2017   Severe persistent asthma without complication (HCC) 05/10/2015   Neck pain 10/21/2014   Low back pain 10/21/2014   Essential hypertension 07/15/2014   HLD (hyperlipidemia) 03/24/2014   Headache on top of head 03/24/2014   TIA (transient ischemic attack) 03/13/2014   Gastroesophageal reflux disease without esophagitis 06/25/2012   Stricture and stenosis of esophagus 12/13/2011   Family history of malignant neoplasm of gastrointestinal tract 11/01/2011   History of colonic polyps 11/01/2011   Allergic rhinitis 09/17/2008   Intrinsic asthma 09/17/2008   Laryngopharyngeal reflux disease 09/17/2008   CHOLECYSTECTOMY, HX OF 09/17/2008    PCP: Sebastian Beverley NOVAK, MD   REFERRING PROVIDER: Charles Redell LABOR, DO   REFERRING DIAG:  [phone] (ICD-10-CM) - Greater trochanteric pain syndrome of left lower extremity  M25.551 (ICD-10-CM) - Greater trochanteric pain syndrome of right lower extremity    THERAPY DIAG:  Pain of both hip joints  Muscle weakness (generalized)  Rationale for Evaluation and Treatment: Rehabilitation  ONSET DATE: 2 years ago  SUBJECTIVE:   SUBJECTIVE STATEMENT: States this year has started better than last year when she was sick with pneumonia.  No hip pain and it's not waking me up anymore.  I had a good workout  here on Monday.  I like working on those steps. I can do step over step at home instead of one at a time.  PERTINENT HISTORY: Osteoporosis, Chronic LBP, COPD, TIA, blood clotting disorder PAIN:  Are you having pain? Yes: NPRS scale: 0 Pain location: B hips L>R Pain description: sharp Aggravating factors: standing and walking Relieving factors: stretches  PRECAUTIONS: Other: osteoporosis  RED FLAGS: None   WEIGHT BEARING RESTRICTIONS: No  FALLS:  Has patient fallen in last 6 months?  No  LIVING ENVIRONMENT: Lives with: lives with their son Lives in: House/apartment Stairs: Yes stairs but she does not use them. 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  DIAGNOSTIC FINDINGS:  XR IMPRESSION: 1. No acute fracture or dislocation of the hips. 2. No evidence of osteonecrosis.  DIAGNOSTIC FINDINGS: DEXA 6/25 LEFT FEMORAL NECK: T-score: -1.9   LEFT TOTAL HIP: T-score: -1.3   RIGHT FEMORAL NECK: T-score: -2.4   RIGHT TOTAL HIP: T-score: -1.6   LEFT FOREARM (RADIUS 33%): T-score: -2.5  PATIENT SURVEYS:  LEFS  Extreme difficulty/unable (0), Quite a bit of difficulty (1), Moderate difficulty (2), Little difficulty (3), No difficulty (4)  Survey date:    Any of your usual work, housework or school activities 1  2. Usual hobbies, recreational or sporting activities 0  3. Getting into/out of the bath 4  4. Walking between rooms 1  5. Putting on socks/shoes 1  6. Squatting  0  7. Lifting an object, like a bag of groceries from the floor 0  8. Performing light activities around your home 4  9. Performing heavy activities around your home 0  10. Getting into/out of a car 3  11. Walking 2 blocks 1  12. Walking 1 mile 0  13. Going up/down 10 stairs (1 flight) 2  14. Standing for 1 hour 1  15.  sitting for 1 hour 2  16. Running on even ground 0  17. Running on uneven ground 0  18. Making sharp turns while running fast 0  19. Hopping  0  20.  Rolling over in bed 1  Score total:  21/80     COGNITION: Overall cognitive status: Within functional limits for tasks assessed     SENSATION: Intermittent N/T in B leg L>R   MUSCLE LENGTH: Tightness in  B HS, piriformis   POSTURE: rounded shoulders and forward head  PALPATION: Palpation: TTP at B superolateral greater trochanter L>R, piriformis L> R, glueals L>R   LOWER EXTREMITY ROM:  Active ROM Right eval Left eval  Hip flexion    Hip extension    Hip abduction    Hip adduction    Hip internal rotation 34 25  Hip external rotation 21 13  Knee flexion    Knee extension    Ankle dorsiflexion    Ankle plantarflexion    Ankle inversion    Ankle eversion     (Blank rows = not tested)  LOWER EXTREMITY MMT:  MMT Right eval Left eval  Hip flexion 4- 4-  Hip extension    Hip abduction    Hip adduction    Hip internal rotation    Hip external rotation    Knee flexion    Knee extension 4+ 5  Ankle dorsiflexion 5 5  Ankle plantarflexion    Ankle inversion    Ankle eversion     (Blank rows = not tested)  LOWER EXTREMITY SPECIAL TESTS:  Hip special tests: Hip scouring test: positive  L ant medial  FUNCTIONAL TESTS:  5 times sit to stand: 20.64 sec used hands on first rep  GAIT: Comments: decreased cadence; narrow BOS                                                                                                                                 TREATMENT DATE:   06/27/24:  NuStep: level 4x 5 min-PT present to discuss progress and plan today's session Hip swings 10x right/left  Hip circles with 1/2 foam roll 5x each direction right/left  6 inch step ups in // bars with 2 hand support 10x right/left  Seated HS stretch 5x right/left Seated hip flexion/abduction with 4# weight resting on thigh as if getting in/out of the car 10x  Seated hip ER/figure 4 stretch 5x right/left  Sit to stand from mat table holding pair of 4# weights at chest 10x Seated  internal/external rotation windshield wipers 10x Standing dead lifts with pair of 4# weights 10x (cues for forward gaze)  Seated lumbar 3 way stretch with green ball 10x    06/23/24:  NuStep: level 4x 5 min-PT present to discuss progress and plan today's session Hip IR ER stretch x10 supine Supine bridge with purple ball squeeze 2 x10 Supine TA activation with ball squeeze 5 hold 2x10 Supine TA activation with hip abduction: red band 2 x10 bil Hooklying march with red loop x 10 bilateral  Sidelying clam x10 right/left with cues to avoid trunk rotation with red loop Sit to stand from mat table 10x 5# DB hold Seated hip IR with purple ball inbetween legs 2 x 10 bilateral  Standing hip extension at counter (slight diagonal) 2 x 10 bilateral  Side stepping at counter x 3 laps Seated figure four 2 x 30 sec bilateral    06/04/24:  NuStep: level 1x 8 min-PT present to discuss progress and plan today's session Hip IR ER stretch x20 supine Supine bridge 5 hold x10 Supine TA activation with ball squeeze 5 hold 2x10 Supine TA activation with hip abduction: red band x10 bil, then single side 5x each side Sidelying clam x10 right/left with cues to avoid trunk rotation Sit to stand from mat table 10x Side step in front of mat table 5x with cues for hip hinge to activate gluteals Manual: addaday to Lt ITB, lateral quads and hamstrings and gluteals in Rt sidelying.  PT monitored for response.   06/02/24:  NuStep: level 5x 6 min-PT present to discuss progress  Hip  IR ER stretch x10 supine Seated hip IR 2x10 bil  Seated hip ER 2x10 bil Supine bridge 5 hold x10 Supine TA activation with ball squeeze 5 hold 2x10 Supine TA activation with hip abduction: yellow band 2x10 Sidelying clam x10 each  Manual: addaday to Lt ITB, lateral quads and hamstrings and gluteals in Rt sidelying.  PT monitored for response.     PATIENT EDUCATION:  Education details: PT eval findings, anticipated POC,  initial HEP, and possible trial of aquatics   Person educated: Patient Education method: Explanation, Demonstration, Tactile cues, Verbal cues, and Handouts Education comprehension: verbalized understanding and returned demonstration  HOME EXERCISE PROGRAM: [id number]  ASSESSMENT:  CLINICAL IMPRESSION: The patient reports her hip is no longer waking her up at night. She also reports functional improvements including the ability to climb steps reciprocally instead of one foot at a time now.  Therapist providing verbal cues to optimize technique with exercises in order to achieve the greatest benefit including forward gaze with dead lifting in order to maintain neutral spine.  Good progress with rehab goals.       OBJECTIVE IMPAIRMENTS: Abnormal gait, decreased activity tolerance, decreased ROM, decreased strength, increased muscle spasms, impaired flexibility, postural dysfunction, and pain.   ACTIVITY LIMITATIONS: sitting, standing, squatting, sleeping, stairs, transfers, bed mobility, dressing, and locomotion level  PARTICIPATION LIMITATIONS: meal prep, cleaning, laundry, shopping, community activity, and occupation  PERSONAL FACTORS: Age and 3+ comorbidities: osteoporosis, chronic LBP, asthma, COPD, depression and HTN are also affecting patient's functional outcome.   REHAB POTENTIAL: Good  CLINICAL DECISION MAKING: Evolving/moderate complexity  EVALUATION COMPLEXITY: Moderate   GOALS: Goals reviewed with patient? Yes  SHORT TERM GOALS: Target date: 06/11/2024   Patient will be independent with initial HEP. Baseline:  Goal status: met 1/9   2.  Decreased hip pain by 30% with ADLs. Baseline:  Goal status: met 1/9  3.  Patient able to sleep through the night without waking from hip pain. Baseline:  Goal status: met 1/9    LONG TERM GOALS: Target date: 07/09/2024   Patient will be independent with advanced/ongoing HEP to improve outcomes and carryover.  Baseline:   Goal status: INITIAL  2.  Patient will report decreased pain by 75% with ADLS. Baseline:  Goal status: met 1/9 (90% better)  3.  Patient will demonstrate improved functional LE strength as demonstrated by improved 5XSTS by 2-3 seconds. Baseline: 20.64 seconds Goal status: INITIAL  4.   Improved LEFS by >= 9 points showing functional improvement. Baseline: 21/80 Goal status: INITIAL     PLAN:  PT FREQUENCY: 2x/week  PT DURATION: 8 weeks  PLANNED INTERVENTIONS: 97110-Therapeutic exercises, 97530- Therapeutic activity, 97112- Neuromuscular re-education, [phone]- Self Care, 02859- Manual therapy, [phone]- Gait training, [phone]- Aquatic Therapy, [phone]- Electrical stimulation (unattended), [phone]- Ultrasound, 02966- Ionotophoresis 4mg /ml Dexamethasone, 79439 (1-2 muscles), [id number] (3+ muscles)- Dry Needling, Patient/Family education, Balance training, Stair training, Taping, Joint mobilization, Spinal mobilization, Cryotherapy, and Moist heat  PLAN FOR NEXT SESSION: assess updated HEP;  L hip ROM, LE strengthening, manual to B gluteals as needed   Glade Pesa, PT 06/27/2024 11:03 AM Phone: [phone] Fax: [phone]        "

## 2024-06-30 ENCOUNTER — Encounter: Payer: Self-pay | Admitting: Physical Therapy

## 2024-06-30 ENCOUNTER — Ambulatory Visit: Admitting: Physical Therapy

## 2024-06-30 DIAGNOSIS — M6281 Muscle weakness (generalized): Secondary | ICD-10-CM

## 2024-06-30 DIAGNOSIS — M25551 Pain in right hip: Secondary | ICD-10-CM | POA: Diagnosis not present

## 2024-06-30 NOTE — Therapy (Signed)
 " OUTPATIENT PHYSICAL THERAPY TREATMENT   Patient Name: Barbara  LAURAMAE [name] MRN: [id number] DOB:November 25, 1935, 89 y.o., female Today's Date: 06/30/2024  END OF SESSION:  PT End of Session - 06/30/24 1040     Visit Number 6    Date for Recertification  07/09/24    Authorization Type [employer] Medicare 16 visits 11/26-1/21/26    Authorization - Visit Number 7    Authorization - Number of Visits 16    Progress Note Due on Visit 10    PT Start Time 0929    PT Stop Time 1016    PT Time Calculation (min) 47 min    Activity Tolerance Patient tolerated treatment well    Behavior During Therapy [facility] for tasks assessed/performed             Past Medical History:  Diagnosis Date   Arthritis    Asthma    Blood clotting disorder    Cataract    COPD (chronic obstructive pulmonary disease) (HCC)    Depression    Diverticulosis 10-2011   Colonoscopy   GERD (gastroesophageal reflux disease)    Hemorrhoids, internal    Hx of colonic polyp    Hyperlipemia    Hypertension    IBS (irritable bowel syndrome)    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   EGD   TIA (transient ischemic attack)    Past Surgical History:  Procedure Laterality Date   CESAREAN SECTION  1962   LAPAROSCOPIC CHOLECYSTECTOMY  1995   Patient Active Problem List   Diagnosis Date Noted   PAD (peripheral artery disease) 04/15/2024   Prediabetes 04/15/2024   Bilateral primary osteoarthritis of hip 04/15/2024   Asthmatic bronchitis , chronic (HCC) 12/26/2023   Age-related osteoporosis without current pathological fracture 12/13/2023   Anaphylactic syndrome 11/29/2023   Duffy-Null Associated Neutrophil Count (DNANC) 10/09/2023   Stenosing tenosynovitis 10/09/2023   CKD stage 3b, GFR 30-44 ml/min (HCC) 10/09/2023   Postmenopausal estrogen deficiency 10/09/2023   Keratoconjunctivitis sicca of both eyes not due to Sjogren's syndrome 09/11/2023   Bilateral sciatica 09/11/2023   Anemia 09/11/2023   Leucopenia 09/11/2023    Palpitations 04/28/2023   Long term (current) use of anticoagulants 02/23/2023   Myalgia 07/21/2019   Thrush 07/21/2019   Dyslipidemia 05/24/2018   Chest pain 05/24/2018   Abnormal EKG 05/24/2018   Pleurisy 09/13/2017   Severe persistent asthma without complication (HCC) 05/10/2015   Neck pain 10/21/2014   Low back pain 10/21/2014   Essential hypertension 07/15/2014   HLD (hyperlipidemia) 03/24/2014   Headache on top of head 03/24/2014   TIA (transient ischemic attack) 03/13/2014   Gastroesophageal reflux disease without esophagitis 06/25/2012   Stricture and stenosis of esophagus 12/13/2011   Family history of malignant neoplasm of gastrointestinal tract 11/01/2011   History of colonic polyps 11/01/2011   Allergic rhinitis 09/17/2008   Intrinsic asthma 09/17/2008   Laryngopharyngeal reflux disease 09/17/2008   CHOLECYSTECTOMY, HX OF 09/17/2008    PCP: Sebastian Beverley NOVAK, MD   REFERRING PROVIDER: Charles Redell LABOR, DO   REFERRING DIAG:  [phone] (ICD-10-CM) - Greater trochanteric pain syndrome of left lower extremity  M25.551 (ICD-10-CM) - Greater trochanteric pain syndrome of right lower extremity    THERAPY DIAG:  Pain of both hip joints  Muscle weakness (generalized)  Rationale for Evaluation and Treatment: Rehabilitation  ONSET DATE: 2 years ago  SUBJECTIVE:   SUBJECTIVE STATEMENT: Patient reports she is feeling really good today. Her hips feel good.   PERTINENT HISTORY: Osteoporosis, Chronic LBP, COPD, TIA,  blood clotting disorder PAIN:  Are you having pain? Yes: NPRS scale: 0 Pain location: B hips L>R Pain description: sharp Aggravating factors: standing and walking Relieving factors: stretches  PRECAUTIONS: Other: osteoporosis  RED FLAGS: None   WEIGHT BEARING RESTRICTIONS: No  FALLS:  Has patient fallen in last 6 months? No  LIVING ENVIRONMENT: Lives with: lives with their son Lives in: House/apartment Stairs: Yes stairs but she does not  use them. 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  DIAGNOSTIC FINDINGS:  XR IMPRESSION: 1. No acute fracture or dislocation of the hips. 2. No evidence of osteonecrosis.  DIAGNOSTIC FINDINGS: DEXA 6/25 LEFT FEMORAL NECK: T-score: -1.9   LEFT TOTAL HIP: T-score: -1.3   RIGHT FEMORAL NECK: T-score: -2.4   RIGHT TOTAL HIP: T-score: -1.6   LEFT FOREARM (RADIUS 33%): T-score: -2.5  PATIENT SURVEYS:  LEFS  Extreme difficulty/unable (0), Quite a bit of difficulty (1), Moderate difficulty (2), Little difficulty (3), No difficulty (4)  Survey date:    Any of your usual work, housework or school activities 1  2. Usual hobbies, recreational or sporting activities 0  3. Getting into/out of the bath 4  4. Walking between rooms 1  5. Putting on socks/shoes 1  6. Squatting  0  7. Lifting an object, like a bag of groceries from the floor 0  8. Performing light activities around your home 4  9. Performing heavy activities around your home 0  10. Getting into/out of a car 3  11. Walking 2 blocks 1  12. Walking 1 mile 0  13. Going up/down 10 stairs (1 flight) 2  14. Standing for 1 hour 1  15.  sitting for 1 hour 2  16. Running on even ground 0  17. Running on uneven ground 0  18. Making sharp turns while running fast 0  19. Hopping  0  20. Rolling over in bed 1  Score total:  21/80     COGNITION: Overall cognitive status: Within functional limits for tasks  assessed     SENSATION: Intermittent N/T in B leg L>R   MUSCLE LENGTH: Tightness in  B HS, piriformis   POSTURE: rounded shoulders and forward head  PALPATION: Palpation: TTP at B superolateral greater trochanter L>R, piriformis L> R, glueals L>R   LOWER EXTREMITY ROM:  Active ROM Right eval Left eval  Hip flexion    Hip extension    Hip abduction    Hip adduction    Hip internal rotation 34 25  Hip external rotation 21 13  Knee flexion    Knee extension    Ankle dorsiflexion    Ankle plantarflexion    Ankle inversion    Ankle eversion     (Blank rows = not tested)  LOWER EXTREMITY MMT:  MMT Right eval Left eval  Hip flexion 4- 4-  Hip extension    Hip abduction    Hip adduction    Hip internal rotation    Hip external rotation    Knee flexion  Knee extension 4+ 5  Ankle dorsiflexion 5 5  Ankle plantarflexion    Ankle inversion    Ankle eversion     (Blank rows = not tested)  LOWER EXTREMITY SPECIAL TESTS:  Hip special tests: Hip scouring test: positive  L ant medial  FUNCTIONAL TESTS:  5 times sit to stand: 20.64 sec used hands on first rep  GAIT: Comments: decreased cadence; narrow BOS                                                                                                                                 TREATMENT DATE:   06/30/24:  NuStep: level 3x 5 min-PT present to discuss progress and plan today's session Hip flexor stretch at stair x 10 bilateral  Hip swings 10x right/left  Step ups at stair x 10 bilateral  Sit to stand from standing chair holding 4# DBs 2 x 10 Seated clam with red loop 2x 10 Seated hip flexion/abduction with 4# weight resting on thigh as if getting in/out of the car 10x  Side stepping at counter x 3 laps Seated lumbar 3 way stretch with green ball 10x  Seated internal/external rotation windshield wipers 10x Seated hip IR with purple ball inbetween legs 2 x 10 bilateral  Seated hip ER/figure 4 stretch 2 x  20 sec bilateral  Farmer's carry with 4# Dbs x 2 laps around both gyms     06/27/24:  NuStep: level 4x 5 min-PT present to discuss progress and plan today's session Hip swings 10x right/left  Hip circles with 1/2 foam roll 5x each direction right/left  6 inch step ups in // bars with 2 hand support 10x right/left  Seated HS stretch 5x right/left Seated hip flexion/abduction with 4# weight resting on thigh as if getting in/out of the car 10x  Seated hip ER/figure 4 stretch 5x right/left  Sit to stand from mat table holding pair of 4# weights at chest 10x Seated internal/external rotation windshield wipers 10x Standing dead lifts with pair of 4# weights 10x (cues for forward gaze)  Seated lumbar 3 way stretch with green ball 10x    06/23/24:  NuStep: level 4x 5 min-PT present to discuss progress and plan today's session Hip IR ER stretch x10 supine Supine bridge with purple ball squeeze 2 x10 Supine TA activation with ball squeeze 5 hold 2x10 Supine TA activation with hip abduction: red band 2 x10 bil Hooklying march with red loop x 10 bilateral  Sidelying clam x10 right/left with cues to avoid trunk rotation with red loop Sit to stand from mat table 10x 5# DB hold Seated hip IR with purple ball inbetween legs 2 x 10 bilateral  Standing hip extension at counter (slight diagonal) 2 x 10 bilateral  Side stepping at counter x 3 laps Seated figure four 2 x 30 sec bilateral     PATIENT EDUCATION:  Education details: PT eval findings, anticipated POC, initial HEP, and  possible trial of aquatics   Person educated: Patient Education method: Explanation, Demonstration, Tactile cues, Verbal cues, and Handouts Education comprehension: verbalized understanding and returned demonstration  HOME EXERCISE PROGRAM: [id number]  ASSESSMENT:  CLINICAL IMPRESSION: [name]  is responding well to skilled therapy. She verbalizes improvements each session she comes in. Treatment session focused  on functional strengthening and hip ROM. She verbalized compliance with HEP and minimal difficulty. PT provided verbal and visual cues as needed for correct exercise performance. Patient is challenged with current level of exercise intensity. Patient will benefit from skilled PT to address the below impairments and improve overall function.      OBJECTIVE IMPAIRMENTS: Abnormal gait, decreased activity tolerance, decreased ROM, decreased strength, increased muscle spasms, impaired flexibility, postural dysfunction, and pain.   ACTIVITY LIMITATIONS: sitting, standing, squatting, sleeping, stairs, transfers, bed mobility, dressing, and locomotion level  PARTICIPATION LIMITATIONS: meal prep, cleaning, laundry, shopping, community activity, and occupation  PERSONAL FACTORS: Age and 3+ comorbidities: osteoporosis, chronic LBP, asthma, COPD, depression and HTN are also affecting patient's functional outcome.   REHAB POTENTIAL: Good  CLINICAL DECISION MAKING: Evolving/moderate complexity  EVALUATION COMPLEXITY: Moderate   GOALS: Goals reviewed with patient? Yes  SHORT TERM GOALS: Target date: 06/11/2024   Patient will be independent with initial HEP. Baseline:  Goal status: met 1/9   2.  Decreased hip pain by 30% with ADLs. Baseline:  Goal status: met 1/9  3.  Patient able to sleep through the night without waking from hip pain. Baseline:  Goal status: met 1/9    LONG TERM GOALS: Target date: 07/09/2024   Patient will be independent with advanced/ongoing HEP to improve outcomes and carryover.  Baseline:  Goal status: INITIAL  2.  Patient will report decreased pain by 75% with ADLS. Baseline:  Goal status: met 1/9 (90% better)  3.  Patient will demonstrate improved functional LE strength as demonstrated by improved 5XSTS by 2-3 seconds. Baseline: 20.64 seconds Goal status: INITIAL  4.   Improved LEFS by >= 9 points showing functional improvement. Baseline: 21/80 Goal  status: INITIAL     PLAN:  PT FREQUENCY: 2x/week  PT DURATION: 8 weeks  PLANNED INTERVENTIONS: 97110-Therapeutic exercises, 97530- Therapeutic activity, 97112- Neuromuscular re-education, 97535- Self Care, 02859- Manual therapy, [phone]- Gait training, [phone]- Aquatic Therapy, [phone]- Electrical stimulation (unattended), [phone]- Ultrasound, 02966- Ionotophoresis 4mg /ml Dexamethasone, 79439 (1-2 muscles), 20561 (3+ muscles)- Dry Needling, Patient/Family education, Balance training, Stair training, Taping, Joint mobilization, Spinal mobilization, Cryotherapy, and Moist heat  PLAN FOR NEXT SESSION: hip strengthening; L hip ROM, LE strengthening, manual to B gluteals as needed    Aislinn Feliz, PT, DPT 06/30/2024 10:42 AM        "

## 2024-07-02 ENCOUNTER — Ambulatory Visit: Admitting: Gastroenterology

## 2024-07-02 ENCOUNTER — Encounter: Payer: Self-pay | Admitting: Gastroenterology

## 2024-07-02 VITALS — BP 120/68 | HR 63 | Ht 59.0 in | Wt 127.8 lb

## 2024-07-02 DIAGNOSIS — R1319 Other dysphagia: Secondary | ICD-10-CM

## 2024-07-02 DIAGNOSIS — K641 Second degree hemorrhoids: Secondary | ICD-10-CM

## 2024-07-02 DIAGNOSIS — K219 Gastro-esophageal reflux disease without esophagitis: Secondary | ICD-10-CM | POA: Diagnosis not present

## 2024-07-02 MED ORDER — HYDROCORTISONE (PERIANAL) 2.5 % EX CREA: 1.0000 | TOPICAL_CREAM | Freq: Two times a day (BID) | CUTANEOUS | 1 refills | Status: AC

## 2024-07-02 NOTE — Progress Notes (Signed)
 "                [name]    [id number]    [birth year]  Primary Care Physician:Sebastian Beverley NOVAK, MD  Referring Physician: Sebastian Beverley NOVAK, MD [address] [place],  [place] [address]   Chief complaint:  Dysphagia, pneumonia  Discussed the use of AI scribe software for clinical note transcription with the patient, who gave verbal consent to proceed.  History of Present Illness Barbara  F [name] is an 89 year old female with gastroesophageal reflux disease and esophageal dysphagia who presents for evaluation of persistent reflux symptoms and difficulty swallowing.  Gastroesophageal Reflux Symptoms: - Persistent acid reflux and throat discomfort have progressively worsened over the past two years - Symptoms were notably exacerbated during a recent episode of bronchitis and pneumonia - Pantoprazole  twice daily provided less relief during periods of severe coughing - Throat irritation may be related to medication use - No current respiratory symptoms since resolution of recent illness  Esophageal Dysphagia: - Sensation of food not passing easily with each meal - Requires sitting upright for hours after eating to achieve relief - Swallowing remains uncomfortable - No choking episodes - Previously declined endoscopy due to concerns about anesthesia  Hemorrhoidal Symptoms: - Longstanding hemorrhoids attributed to constipation - One hemorrhoid has been present for an extended period but is not currently bothersome   GI Hx:  Echo 05-30-12  EGD 11-16-11 -Polyps, multiple in the body and the antrum of the stomach -Stricture at the gastroesophageal junction-s/p maloney dilatation.  -otherwise normal   Colonoscopy 11-16-11 -Mild diverticulosis in the sigmund colon. -Diverticula, scattered in the mid transverse colon -Internal hemorrhoids -otherwise normal    Colonoscopy 11-11-04 -internal hemorrhoids -Colon poly- proximal ascending colon   Outpatient  Encounter Medications as of 07/02/2024  Medication Sig   albuterol  (PROVENTIL ) (2.5 MG/3ML) 0.083% nebulizer solution Take 3 mLs (2.5 mg total) by nebulization every 6 (six) hours as needed for wheezing or shortness of breath.   albuterol  (VENTOLIN  HFA) 108 (90 Base) MCG/ACT inhaler INHALE TWO PUFFS BY MOUTH INTO LUNGS EVERY SIX HOURS AS NEEDED FOR WHEEZING AND/OR SHORTNESS OF BREATH   azelastine  (ASTELIN ) 0.1 % nasal spray Place 2 sprays into both nostrils 2 (two) times daily.   cetirizine  (ZYRTEC  ALLERGY) 10 MG tablet Take 1 tablet (10 mg total) by mouth at bedtime.   cholecalciferol (VITAMIN D3) 25 MCG (1000 UNIT) tablet Take 1,000 Units by mouth daily.   clopidogrel  (PLAVIX ) 75 MG tablet Take 1 tablet (75 mg total) by mouth daily.   cycloSPORINE  (RESTASIS ) 0.05 % ophthalmic emulsion Place 1 drop into both eyes 2 (two) times daily.   EPINEPHrine  0.3 mg/0.3 mL IJ SOAJ injection Inject 0.3 mg into the muscle once as needed (for a severe allergic reaction/use as directed).   famotidine  (PEPCID ) 40 MG tablet Take 1 tablet (40 mg total) by mouth at bedtime.   fluticasone  (FLONASE ) 50 MCG/ACT nasal spray Place 2 sprays into both nostrils 2 (two) times daily.   linaclotide  (LINZESS ) 72 MCG capsule Take 1 capsule orally once daily at least 30 minutes prior to a meal, at approximately the same time each day   losartan  (COZAAR ) 50 MG tablet Take 1 tablet (50 mg total) by mouth daily.   montelukast  (SINGULAIR ) 10 MG tablet Take 1 tablet (10 mg total) by mouth at bedtime.   Multiple Vitamin (MULTIVITAMIN) tablet Take 1 tablet by mouth daily.    ondansetron  (ZOFRAN -ODT) 4 MG disintegrating tablet Take 1  tablet (4 mg total) by mouth every 8 (eight) hours as needed for nausea or vomiting.   pantoprazole  (PROTONIX ) 40 MG tablet Take 1 tablet (40 mg total) by mouth 2 (two) times daily before a meal.   Probiotic Product (PROBIOTIC PO) Take 1 capsule by mouth daily.    Propylene Glycol (SYSTANE BALANCE) 0.6 % SOLN  Place 1 drop into both eyes 4 (four) times daily as needed (dry eyes).   Respiratory Therapy Supplies (FLUTTER) DEVI Use device 2-3 times a day to break up congestion   rosuvastatin (CRESTOR) 10 MG tablet Take 10 mg by mouth at bedtime.   Spacer/Aero-Holding Chambers (AEROCHAMBER MV) inhaler Use as instructed   SYMBICORT  80-4.5 MCG/ACT inhaler Inhale 2 puffs into the lungs 2 (two) times daily.   acetaminophen  (TYLENOL ) 500 MG tablet Take 1-2 tablets (500-1,000 mg total) by mouth every 6 (six) hours as needed for headache.   benzonatate  (TESSALON ) 100 MG capsule Take 1 capsule (100 mg total) by mouth 3 (three) times daily as needed for cough.   HYDROcodone  bit-homatropine (HYCODAN) 5-1.5 MG/5ML syrup Take 5 mLs by mouth every 6 (six) hours as needed for cough.   No facility-administered encounter medications on file as of 07/02/2024.    Allergies as of 07/02/2024 - Review Complete 07/02/2024  Allergen Reaction Noted   Cepacol Shortness Of Breath 09/28/2011   Contrast media [iodinated contrast media] Shortness Of Breath and Swelling 05/24/2018   Iodine Shortness Of Breath 04/16/2018   Lactose intolerance (gi) Shortness Of Breath 12/29/2012   Mushroom extract complex (obsolete) Shortness Of Breath 06/25/2018   Other Hives, Shortness Of Breath, and Swelling 10/16/2014   Penicillins Shortness Of Breath and Swelling 05/02/2012   Sulfamethoxazole Anaphylaxis 05/02/2012   Aspirin  Other (See Comments) 05/30/2023   Ciprofloxacin Other (See Comments) 03/17/2020   Budesonide  Other (See Comments) 02/14/2021    Past Medical History:  Diagnosis Date   Arthritis    Asthma    Blood clotting disorder    Cataract    COPD (chronic obstructive pulmonary disease) (HCC)    Depression    Diverticulosis 10-2011   Colonoscopy   GERD (gastroesophageal reflux disease)    Hemorrhoids, internal    Hx of colonic polyp    Hyperlipemia    Hypertension    IBS (irritable bowel syndrome)    Stricture of  esophagus 10-2011   EGD   TIA (transient ischemic attack)     Past Surgical History:  Procedure Laterality Date   CESAREAN SECTION  1962   LAPAROSCOPIC CHOLECYSTECTOMY  1995    Family History  Problem Relation Age of Onset   Heart disease Mother    Hypertension Mother    Diabetes Mother    Asthma Father    Colon cancer Sister 40   Irritable bowel syndrome Sister    Breast cancer Other        Niece   Stomach cancer Neg Hx     Social History   Socioeconomic History   Marital status: Widowed    Spouse name: Not on file   Number of children: 1   Years of education: college   Highest education level: Not on file  Occupational History   Occupation: retired  Tobacco Use   Smoking status: Former    Current packs/day: 0.00    Average packs/day: 0.5 packs/day for 4.3 years (2.1 ttl pk-yrs)    Types: Cigarettes    Start date: 52    Quit date: 09/28/1959    Years since quitting:  64.8    Passive exposure: Never   Smokeless tobacco: Never  Vaping Use   Vaping status: Never Used  Substance and Sexual Activity   Alcohol  use: No    Alcohol /week: 0.0 standard drinks of alcohol    Drug use: No   Sexual activity: Not Currently  Other Topics Concern   Not on file  Social History Narrative   Patient is widowed with one child.   Patient is right handed.   Patient has college education.   Patient does not drink caffeine.   Social Drivers of Health   Tobacco Use: Medium Risk (07/02/2024)   Patient History    Smoking Tobacco Use: Former    Smokeless Tobacco Use: Never    Passive Exposure: Never  Physicist, Medical Strain: Not on file  Food Insecurity: Low Risk (06/08/2023)   Received from [facility]   Epic    Within the past 12 months, you worried that your food would run out before you got money to buy more: Never true    Within the past 12 months, the food you bought just didn't last and you didn't have money to get more. : Never true  Recent Concern: Food Insecurity  - Medium Risk (05/18/2023)   Received from [facility]   Hunger Vital Sign    Worried About Running Out of Food in the Last Year: Patient declined to answer    Ran Out of Food in the Last Year: Sometimes true  Transportation Needs: No Transportation Needs (06/08/2023)   Received from [facility]    In the past 12 months, has lack of reliable transportation kept you from medical appointments, meetings, work or from getting things needed for daily living? : No  Physical Activity: Not on file  Stress: Not on file  Social Connections: Not on file  Intimate Partner Violence: Not At Risk (04/27/2023)   Humiliation, Afraid, Rape, and Kick questionnaire    Fear of Current or Ex-Partner: No    Emotionally Abused: No    Physically Abused: No    Sexually Abused: No  Depression (PHQ2-9): Low Risk (05/27/2024)   Depression (PHQ2-9)    PHQ-2 Score: 0  Alcohol  Screen: Not on file  Housing: Low Risk (06/08/2023)   Received from [facility]   Epic    What is your living situation today?: I have a steady place to live    Think about the place you live. Do you have problems with any of the following? Choose all that apply:: None/None on this list  Utilities: Low Risk (06/08/2023)   Received from [facility]   Utilities    In the past 12 months has the electric, gas, oil, or water company threatened to shut off services in your home? : No  Health Literacy: Not on file      Review of systems: All other review of systems negative except as mentioned in the HPI.   Physical Exam: Vitals:   07/02/24 1054  BP: 120/68  Pulse: 63   Body mass index is 25.81 kg/m. Gen:      No acute distress HEENT:  sclera anicteric CV: s1s2 rrr, no murmur Lungs: B/l clear. Abd:      soft, non-tender; no palpable masses, no distension Ext:    No edema Neuro: alert and oriented x 3 Psych: normal mood and affect  Data Reviewed:  Reviewed labs, radiology imaging, old records and  pertinent past GI work up   Assessment & Plan Esophageal dysphagia  and gastroesophageal reflux disease Chronic esophageal dysphagia and gastroesophageal reflux disease with recent exacerbation post-respiratory illness. Symptoms persist despite acid suppression, requiring prolonged upright positioning after meals. She prefers noninvasive diagnostics due to reluctance for anesthesia. - Ordered barium esophagram to assess esophageal anatomy and function, including evaluation for hiatal hernia and etiology of dysphagia and reflux. - Continued pantoprazole  twice daily. - Deferred endoscopy unless barium esophagram reveals concerning findings.  Grade II hemorrhoids Chronic, minimally symptomatic Grade II hemorrhoids likely related to constipation. Currently not bothersome and does not require intervention beyond conservative measures. - Recommended conservative management with topical hemorrhoid cream as needed for swelling or discomfort, up to twice daily for no longer than one week at a time. - Advised to keep the perianal area as dry as possible. - No further intervention required unless symptoms worsen.      This visit required >30 minutes of patient care (this includes precharting, chart review, review of results, face-to-face time used for counseling as well as treatment plan and follow-up. The patient was provided an opportunity to ask questions and all were answered. The patient agreed with the plan and demonstrated an understanding of the instructions.  LOIS Wilkie Mcgee , MD    CC: Sebastian Beverley NOVAK, MD    "

## 2024-07-02 NOTE — Patient Instructions (Signed)
 VISIT SUMMARY:  Today, we discussed your persistent acid reflux and difficulty swallowing, as well as your longstanding hemorrhoids. We have planned some diagnostic tests and continued your current medications.  YOUR PLAN:  ESOPHAGEAL DYSPHAGIA AND GASTROESOPHAGEAL REFLUX DISEASE: You have chronic difficulty swallowing and acid reflux, which worsened after a recent respiratory illness. Your symptoms persist despite taking medication. -We have ordered a barium esophagram to assess your esophagus and check for any issues like a hiatal hernia. -Continue taking pantoprazole  twice daily. -We will consider an endoscopy only if the barium esophagram shows concerning findings.  GRADE II HEMORRHOIDS: You have chronic hemorrhoids that are not currently bothersome. -Use topical hemorrhoid cream as needed for swelling or discomfort, up to twice daily for no longer than one week at a time. -Keep the perianal area as dry as possible. -No further intervention is needed unless your symptoms worsen.  You have been scheduled for a Barium Esophogram at [facility] (1st floor of the hospital) on 07/24/2024 at 10 am. Please arrive 30 minutes prior to your appointment for registration. Make certain not to have anything to eat or drink 3 hours prior to your test. If you need to reschedule for any reason, please contact radiology at [phone] to do so. __________________________________________________________________ A barium swallow is an examination that concentrates on views of the esophagus. This tends to be a double contrast exam (barium and two liquids which, when combined, create a gas to distend the wall of the oesophagus) or single contrast (non-ionic iodine based). The study is usually tailored to your symptoms so a good history is essential. Attention is paid during the study to the form, structure and configuration of the esophagus, looking for functional disorders (such as aspiration, dysphagia,  achalasia, motility and reflux) EXAMINATION You may be asked to change into a gown, depending on the type of swallow being performed. A radiologist and radiographer will perform the procedure. The radiologist will advise you of the type of contrast selected for your procedure and direct you during the exam. You will be asked to stand, sit or lie in several different positions and to hold a small amount of fluid in your mouth before being asked to swallow while the imaging is performed .In some instances you may be asked to swallow barium coated marshmallows to assess the motility of a solid food bolus. The exam can be recorded as a digital or video fluoroscopy procedure. POST PROCEDURE It will take 1-2 days for the barium to pass through your system. To facilitate this, it is important, unless otherwise directed, to increase your fluids for the next 24-48hrs and to resume your normal diet.  This test typically takes about 30 minutes to perform. __________________________________________________________________________________  We have sent the following medications to your pharmacy for you to pick up at your convenience: Anusol  Cream  Due to recent changes in healthcare laws, you may see the results of your imaging and laboratory studies on MyChart before your provider has had a chance to review them.  We understand that in some cases there may be results that are confusing or concerning to you. Not all laboratory results come back in the same time frame and the provider may be waiting for multiple results in order to interpret others.  Please give us  48 hours in order for your provider to thoroughly review all the results before contacting the office for clarification of your results.    I appreciate the  opportunity to care for you  Thank You  Kavitha Nandigam , MD

## 2024-07-04 ENCOUNTER — Ambulatory Visit: Admitting: Physical Therapy

## 2024-07-04 ENCOUNTER — Encounter: Payer: Self-pay | Admitting: Physical Therapy

## 2024-07-04 DIAGNOSIS — M6281 Muscle weakness (generalized): Secondary | ICD-10-CM

## 2024-07-04 DIAGNOSIS — M25551 Pain in right hip: Secondary | ICD-10-CM | POA: Diagnosis not present

## 2024-07-04 NOTE — Therapy (Signed)
 " OUTPATIENT PHYSICAL THERAPY TREATMENT   Patient Name: [name] MRN: [id number] DOB:1936-01-13, 89 y.o., female Today's Date: 07/04/2024  END OF SESSION:  PT End of Session - 07/04/24 0929     Visit Number 7    Date for Recertification  07/09/24    Authorization Type [employer] 16 visits 11/26-1/21/26    Authorization - Visit Number 8    Authorization - Number of Visits 16    PT Start Time 0930    PT Stop Time 1010    PT Time Calculation (min) 40 min    Activity Tolerance Patient tolerated treatment well             Past Medical History:  Diagnosis Date   Arthritis    Asthma    Blood clotting disorder    Cataract    COPD (chronic obstructive pulmonary disease) (HCC)    Depression    Diverticulosis 10-2011   Colonoscopy   GERD (gastroesophageal reflux disease)    Hemorrhoids, internal    Hx of colonic polyp    Hyperlipemia    Hypertension    IBS (irritable bowel syndrome)    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   EGD   TIA (transient ischemic attack)    Past Surgical History:  Procedure Laterality Date   CESAREAN SECTION  1962   LAPAROSCOPIC CHOLECYSTECTOMY  1995   Patient Active Problem List   Diagnosis Date Noted   PAD (peripheral artery disease) 04/15/2024   Prediabetes 04/15/2024   Bilateral primary osteoarthritis of hip 04/15/2024   Asthmatic bronchitis , chronic (HCC) 12/26/2023   Age-related osteoporosis without current pathological fracture 12/13/2023   Anaphylactic syndrome 11/29/2023   Duffy-Null Associated Neutrophil Count (DNANC) 10/09/2023   Stenosing tenosynovitis 10/09/2023   CKD stage 3b, GFR 30-44 ml/min (HCC) 10/09/2023   Postmenopausal estrogen deficiency 10/09/2023   Keratoconjunctivitis sicca of both eyes not due to Sjogren's syndrome 09/11/2023   Bilateral sciatica 09/11/2023   Anemia 09/11/2023   Leucopenia 09/11/2023   Palpitations 04/28/2023   Long term (current) use of anticoagulants 02/23/2023   Myalgia 07/21/2019    Thrush 07/21/2019   Dyslipidemia 05/24/2018   Chest pain 05/24/2018   Abnormal EKG 05/24/2018   Pleurisy 09/13/2017   Severe persistent asthma without complication (HCC) 05/10/2015   Neck pain 10/21/2014   Low back pain 10/21/2014   Essential hypertension 07/15/2014   HLD (hyperlipidemia) 03/24/2014   Headache on top of head 03/24/2014   TIA (transient ischemic attack) 03/13/2014   Gastroesophageal reflux disease without esophagitis 06/25/2012   Stricture and stenosis of esophagus 12/13/2011   Family history of malignant neoplasm of gastrointestinal tract 11/01/2011   History of colonic polyps 11/01/2011   Allergic rhinitis 09/17/2008   Intrinsic asthma 09/17/2008   Laryngopharyngeal reflux disease 09/17/2008   CHOLECYSTECTOMY, HX OF 09/17/2008    PCP: Sebastian Beverley NOVAK, MD   REFERRING PROVIDER: Charles Redell LABOR, DO   REFERRING DIAG:  [id number] (ICD-10-CM) - Greater trochanteric pain syndrome of left lower extremity  M25.551 (ICD-10-CM) - Greater trochanteric pain syndrome of right lower extremity    THERAPY DIAG:  Pain of both hip joints  Muscle weakness (generalized)  Rationale for Evaluation and Treatment: Rehabilitation  ONSET DATE: 2 years ago  SUBJECTIVE:   SUBJECTIVE STATEMENT: Has 2 invitations to go out for her birthday.  No hip pain today.  I'm walking up and down the stairs good, that was one of my goals.  The ball stretch is a little too much.  PERTINENT HISTORY: Osteoporosis, Chronic LBP, COPD, TIA, blood clotting disorder PAIN:  Are you having pain? Yes: NPRS scale: 0 Pain location: B hips L>R Pain description: sharp Aggravating factors: standing and walking Relieving factors: stretches  PRECAUTIONS: Other: osteoporosis  RED FLAGS: None   WEIGHT BEARING RESTRICTIONS: No  FALLS:  Has patient fallen in last 6 months? No  LIVING ENVIRONMENT: Lives with: lives with their son Lives in: House/apartment Stairs: Yes stairs but she does not  use them. 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  DIAGNOSTIC FINDINGS:  XR IMPRESSION: 1. No acute fracture or dislocation of the hips. 2. No evidence of osteonecrosis.  DIAGNOSTIC FINDINGS: DEXA 6/25 LEFT FEMORAL NECK: T-score: -1.9   LEFT TOTAL HIP: T-score: -1.3   RIGHT FEMORAL NECK: T-score: -2.4   RIGHT TOTAL HIP: T-score: -1.6   LEFT FOREARM (RADIUS 33%): T-score: -2.5  PATIENT SURVEYS:  LEFS  Extreme difficulty/unable (0), Quite a bit of difficulty (1), Moderate difficulty (2), Little difficulty (3), No difficulty (4)  Survey date:    Any of your usual work, housework or school activities 1  2. Usual hobbies, recreational or sporting activities 0  3. Getting into/out of the bath 4  4. Walking between rooms 1  5. Putting on socks/shoes 1  6. Squatting  0  7. Lifting an object, like a bag of groceries from the floor 0  8. Performing light activities around your home 4  9. Performing heavy activities around your home 0  10. Getting into/out of a car 3  11. Walking 2 blocks 1  12. Walking 1 mile 0  13. Going up/down 10 stairs (1 flight) 2  14. Standing for 1 hour 1  15.  sitting for 1 hour 2  16. Running on even ground 0  17. Running on uneven ground 0  18. Making sharp turns while running fast 0  19. Hopping  0  20. Rolling over in bed 1  Score total:  21/80     COGNITION: Overall cognitive status: Within functional limits for tasks  assessed     SENSATION: Intermittent N/T in B leg L>R   MUSCLE LENGTH: Tightness in  B HS, piriformis   POSTURE: rounded shoulders and forward head  PALPATION: Palpation: TTP at B superolateral greater trochanter L>R, piriformis L> R, glueals L>R   LOWER EXTREMITY ROM:  Active ROM Right eval Left eval  Hip flexion    Hip extension    Hip abduction    Hip adduction    Hip internal rotation 34 25  Hip external rotation 21 13  Knee flexion    Knee extension    Ankle dorsiflexion    Ankle plantarflexion    Ankle inversion    Ankle eversion     (Blank rows = not tested)  LOWER EXTREMITY MMT:  MMT Right eval Left eval  Hip flexion 4- 4-  Hip extension    Hip abduction    Hip adduction    Hip internal rotation    Hip external rotation  Knee flexion    Knee extension 4+ 5  Ankle dorsiflexion 5 5  Ankle plantarflexion    Ankle inversion    Ankle eversion     (Blank rows = not tested)  LOWER EXTREMITY SPECIAL TESTS:  Hip special tests: Hip scouring test: positive  L ant medial  FUNCTIONAL TESTS:  5 times sit to stand: 20.64 sec used hands on first rep  GAIT: Comments: decreased cadence; narrow BOS                                                                                                                                 TREATMENT DATE:   06/30/24:  NuStep: level 3x 5 min-PT present to discuss progress and plan today's session At the stairs 2nd step hip flexor, gastroc and quadratus lumborum muscle lengthening 3 sets of 5 right/left: Rocking forward and back Rocking forward and back with arm elevation Rocking forward and back with arm reach up and over At the stairs 2nd step hamstring stretch 5x right/left Step ups at stair 2x 5 right/left Sit to stand from standing chair holding 4# DBs 2 x 10 Seated clam with red loop x 10 Standing red loop above knees hip abduction 10x (no UE support needed) Standing red loop above knees hip extension diagonal  10x (back of the chair touch for balance) Standing hip circles around tall cone 10x right/left (back of the chair touch for balance) Leg press seat 6 bil 60# 10x2 Resisted cable walk with belt 5# backwards only 8x Standing cable rows 5# 10x Discussed her plan to go the [employer] (she states she likes the machines)     06/27/24:  NuStep: level 4x 5 min-PT present to discuss progress and plan today's session Hip swings 10x right/left  Hip circles with 1/2 foam roll 5x each direction right/left  6 inch step ups in // bars with 2 hand support 10x right/left  Seated HS stretch 5x right/left Seated hip flexion/abduction with 4# weight resting on thigh as if getting in/out of the car 10x  Seated hip ER/figure 4 stretch 5x right/left  Sit to stand from mat table holding pair of 4# weights at chest 10x Seated internal/external rotation windshield wipers 10x Standing dead lifts with pair of 4# weights 10x (cues for forward gaze)  Seated lumbar 3 way stretch with green ball 10x    06/23/24:  NuStep: level 4x 5 min-PT present to discuss progress and plan today's session Hip IR ER stretch x10 supine Supine bridge with purple ball squeeze 2 x10 Supine TA activation with ball squeeze 5 hold 2x10 Supine TA activation with hip abduction: red band 2 x10 bil Hooklying march with red loop x 10 bilateral  Sidelying clam x10 right/left with cues to avoid trunk rotation with red loop Sit to stand from mat table 10x 5# DB hold Seated hip IR with purple ball inbetween legs 2 x 10 bilateral  Standing hip extension at counter (  slight diagonal) 2 x 10 bilateral  Side stepping at counter x 3 laps Seated figure four 2 x 30 sec bilateral     PATIENT EDUCATION:  Education details: PT eval findings, anticipated POC, initial HEP, and possible trial of aquatics   Person educated: Patient Education method: Explanation, Demonstration, Tactile cues, Verbal cues, and Handouts Education comprehension: verbalized  understanding and returned demonstration  HOME EXERCISE PROGRAM: [id number]  ASSESSMENT:  CLINICAL IMPRESSION: Therapist monitoring response to all interventions and modifying treatment accordingly.  Also providing verbal cues for technique for exercises to optimize benefit.  Patient able to progress weights/resistance in several exercises today without difficulty or production of pain. On track to meet rehab goals.       OBJECTIVE IMPAIRMENTS: Abnormal gait, decreased activity tolerance, decreased ROM, decreased strength, increased muscle spasms, impaired flexibility, postural dysfunction, and pain.   ACTIVITY LIMITATIONS: sitting, standing, squatting, sleeping, stairs, transfers, bed mobility, dressing, and locomotion level  PARTICIPATION LIMITATIONS: meal prep, cleaning, laundry, shopping, community activity, and occupation  PERSONAL FACTORS: Age and 3+ comorbidities: osteoporosis, chronic LBP, asthma, COPD, depression and HTN are also affecting patient's functional outcome.   REHAB POTENTIAL: Good  CLINICAL DECISION MAKING: Evolving/moderate complexity  EVALUATION COMPLEXITY: Moderate   GOALS: Goals reviewed with patient? Yes  SHORT TERM GOALS: Target date: 06/11/2024   Patient will be independent with initial HEP. Baseline:  Goal status: met 1/9   2.  Decreased hip pain by 30% with ADLs. Baseline:  Goal status: met 1/9  3.  Patient able to sleep through the night without waking from hip pain. Baseline:  Goal status: met 1/9    LONG TERM GOALS: Target date: 07/09/2024   Patient will be independent with advanced/ongoing HEP to improve outcomes and carryover.  Baseline:  Goal status: INITIAL  2.  Patient will report decreased pain by 75% with ADLS. Baseline:  Goal status: met 1/9 (90% better)  3.  Patient will demonstrate improved functional LE strength as demonstrated by improved 5XSTS by 2-3 seconds. Baseline: 20.64 seconds Goal status: INITIAL  4.    Improved LEFS by >= 9 points showing functional improvement. Baseline: 21/80 Goal status: INITIAL     PLAN:  PT FREQUENCY: 2x/week  PT DURATION: 8 weeks  PLANNED INTERVENTIONS: 97110-Therapeutic exercises, 97530- Therapeutic activity, 97112- Neuromuscular re-education, [phone]- Self Care, 02859- Manual therapy, [phone]- Gait training, [phone]- Aquatic Therapy, [phone]- Electrical stimulation (unattended), [phone]- Ultrasound, [id number]- Ionotophoresis 4mg /ml Dexamethasone, 79439 (1-2 muscles), [id number] (3+ muscles)- Dry Needling, Patient/Family education, Balance training, Stair training, Taping, Joint mobilization, Spinal mobilization, Cryotherapy, and Moist heat  PLAN FOR NEXT SESSION: encourage continuation of ex post rehab; hip strengthening; L hip ROM, LE strengthening, manual to B gluteals as needed  Glade Pesa, PT 07/04/24 10:13 AM Phone: [phone] Fax: [phone]            "

## 2024-07-07 ENCOUNTER — Encounter: Payer: Self-pay | Admitting: Gastroenterology

## 2024-07-07 ENCOUNTER — Telehealth: Payer: Self-pay

## 2024-07-07 ENCOUNTER — Ambulatory Visit

## 2024-07-07 NOTE — Telephone Encounter (Signed)
 PT called pt due to no-show for appt today.  Left voicemail.

## 2024-07-09 ENCOUNTER — Ambulatory Visit: Admitting: Physical Therapy

## 2024-07-09 ENCOUNTER — Encounter: Payer: Self-pay | Admitting: Physical Therapy

## 2024-07-09 DIAGNOSIS — M25551 Pain in right hip: Secondary | ICD-10-CM

## 2024-07-09 DIAGNOSIS — M6281 Muscle weakness (generalized): Secondary | ICD-10-CM

## 2024-07-09 NOTE — Therapy (Signed)
 " OUTPATIENT PHYSICAL THERAPY TREATMENT/ DISCHARGE NOTE   Patient Name: Barbara  SAMRA [name] MRN: [id number] DOB:1936-04-19, 89 y.o., female Today's Date: 07/09/2024  END OF SESSION:  PT End of Session - 07/09/24 1034     Visit Number 8    Date for Recertification  07/09/24    Authorization Type [employer] 16 visits 11/26-1/21/26    Authorization - Visit Number 9    Authorization - Number of Visits 16    Progress Note Due on Visit 10    PT Start Time 0930    PT Stop Time 1012    PT Time Calculation (min) 42 min    Activity Tolerance Patient tolerated treatment well    Behavior During Therapy WFL for tasks assessed/performed              Past Medical History:  Diagnosis Date   Arthritis    Asthma    Blood clotting disorder    Cataract    COPD (chronic obstructive pulmonary disease) (HCC)    Depression    Diverticulosis 10-2011   Colonoscopy   GERD (gastroesophageal reflux disease)    Hemorrhoids, internal    Hx of colonic polyp    Hyperlipemia    Hypertension    IBS (irritable bowel syndrome)    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   EGD   TIA (transient ischemic attack)    Past Surgical History:  Procedure Laterality Date   CESAREAN SECTION  1962   LAPAROSCOPIC CHOLECYSTECTOMY  1995   Patient Active Problem List   Diagnosis Date Noted   PAD (peripheral artery disease) 04/15/2024   Prediabetes 04/15/2024   Bilateral primary osteoarthritis of hip 04/15/2024   Asthmatic bronchitis , chronic (HCC) 12/26/2023   Age-related osteoporosis without current pathological fracture 12/13/2023   Anaphylactic syndrome 11/29/2023   Duffy-Null Associated Neutrophil Count (DNANC) 10/09/2023   Stenosing tenosynovitis 10/09/2023   CKD stage 3b, GFR 30-44 ml/min (HCC) 10/09/2023   Postmenopausal estrogen deficiency 10/09/2023   Keratoconjunctivitis sicca of both eyes not due to Sjogren's syndrome 09/11/2023   Bilateral sciatica 09/11/2023   Anemia 09/11/2023   Leucopenia  09/11/2023   Palpitations 04/28/2023   Long term (current) use of anticoagulants 02/23/2023   Myalgia 07/21/2019   Thrush 07/21/2019   Dyslipidemia 05/24/2018   Chest pain 05/24/2018   Abnormal EKG 05/24/2018   Pleurisy 09/13/2017   Severe persistent asthma without complication (HCC) 05/10/2015   Neck pain 10/21/2014   Low back pain 10/21/2014   Essential hypertension 07/15/2014   HLD (hyperlipidemia) 03/24/2014   Headache on top of head 03/24/2014   TIA (transient ischemic attack) 03/13/2014   Gastroesophageal reflux disease without esophagitis 06/25/2012   Stricture and stenosis of esophagus 12/13/2011   Family history of malignant neoplasm of gastrointestinal tract 11/01/2011   History of colonic polyps 11/01/2011   Allergic rhinitis 09/17/2008   Intrinsic asthma 09/17/2008   Laryngopharyngeal reflux disease 09/17/2008   CHOLECYSTECTOMY, HX OF 09/17/2008    PCP: Sebastian Beverley NOVAK, MD   REFERRING PROVIDER: Sebastian Beverley NOVAK, MD   REFERRING DIAG:  [phone] (ICD-10-CM) - Greater trochanteric pain syndrome of left lower extremity  M25.551 (ICD-10-CM) - Greater trochanteric pain syndrome of right lower extremity    THERAPY DIAG:  Pain of both hip joints  Muscle weakness (generalized)  Rationale for Evaluation and Treatment: Rehabilitation  ONSET DATE: 2 years ago  SUBJECTIVE:   SUBJECTIVE STATEMENT: Patient reports she is doing good today. She is not currently having any pain. She is ready for  discharge.  PERTINENT HISTORY: Osteoporosis, Chronic LBP, COPD, TIA, blood clotting disorder PAIN:  Are you having pain? Yes: NPRS scale: 0 Pain location: B hips L>R Pain description: sharp Aggravating factors: standing and walking Relieving factors: stretches  PRECAUTIONS: Other: osteoporosis  RED FLAGS: None   WEIGHT BEARING RESTRICTIONS: No  FALLS:  Has patient fallen in last 6 months? No  LIVING ENVIRONMENT: Lives with: lives with their son Lives in:  House/apartment Stairs: Yes stairs but she does not use them. 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  DIAGNOSTIC FINDINGS:  XR IMPRESSION: 1. No acute fracture or dislocation of the hips. 2. No evidence of osteonecrosis.  DIAGNOSTIC FINDINGS: DEXA 6/25 LEFT FEMORAL NECK: T-score: -1.9   LEFT TOTAL HIP: T-score: -1.3   RIGHT FEMORAL NECK: T-score: -2.4   RIGHT TOTAL HIP: T-score: -1.6   LEFT FOREARM (RADIUS 33%): T-score: -2.5  PATIENT SURVEYS:  LEFS  Extreme difficulty/unable (0), Quite a bit of difficulty (1), Moderate difficulty (2), Little difficulty (3), No difficulty (4)  Survey date:    Any of your usual work, housework or school activities 1  2. Usual hobbies, recreational or sporting activities 0  3. Getting into/out of the bath 4  4. Walking between rooms 1  5. Putting on socks/shoes 1  6. Squatting  0  7. Lifting an object, like a bag of groceries from the floor 0  8. Performing light activities around your home 4  9. Performing heavy activities around your home 0  10. Getting into/out of a car 3  11. Walking 2 blocks 1  12. Walking 1 mile 0  13. Going up/down 10 stairs (1 flight) 2  14. Standing for 1 hour 1  15.  sitting for 1 hour 2  16. Running on even ground 0  17. Running on uneven ground 0  18. Making sharp turns while running fast 0  19. Hopping  0  20. Rolling over in bed 1  Score total:  21/80    07/09/2024 LEFS : 69/80  86.3%  COGNITION: Overall cognitive status: Within functional limits for tasks assessed     SENSATION: Intermittent N/T in B leg L>R   MUSCLE LENGTH: Tightness in  B HS, piriformis   POSTURE: rounded shoulders and forward head  PALPATION: Palpation: TTP at B superolateral greater trochanter L>R, piriformis L> R, glueals L>R   LOWER EXTREMITY ROM:  Active ROM Right eval Left eval  Hip flexion    Hip extension    Hip abduction    Hip adduction    Hip internal rotation 34 25  Hip external rotation 21 13  Knee flexion    Knee extension    Ankle dorsiflexion    Ankle plantarflexion    Ankle inversion    Ankle eversion     (Blank rows = not tested)  LOWER EXTREMITY MMT:  MMT Right eval Left eval  Hip flexion 4- 4-  Hip extension    Hip abduction    Hip adduction    Hip internal  rotation    Hip external rotation    Knee flexion    Knee extension 4+ 5  Ankle dorsiflexion 5 5  Ankle plantarflexion    Ankle inversion    Ankle eversion     (Blank rows = not tested)  LOWER EXTREMITY SPECIAL TESTS:  Hip special tests: Hip scouring test: positive  L ant medial  FUNCTIONAL TESTS:  5 times sit to stand: 20.64 sec used hands on first rep 07/09/2024 : 11.64sec no UE support  GAIT: Comments: decreased cadence; narrow BOS                                                                                                                                 TREATMENT DATE:   07/09/24:  NuStep: level 3x 5 min-PT present to discuss progress and plan today's session LEFS, 5STS, Goal Assessment Seated clam with green TB x 20 Standing hip extension 2 x 10 bilateral  Standing single leg clock x 5 each direction Step ups at stair with 5# unilateral DB hold x 15 bilateral  Heels raise off bottom step 2 x 10 Resisted cable walk with belt 5# backwards only 8x Hip swings 10x right/left    06/30/24:  NuStep: level 3x 5 min-PT present to discuss progress and plan today's  session At the stairs 2nd step hip flexor, gastroc and quadratus lumborum muscle lengthening 3 sets of 5 right/left: Rocking forward and back Rocking forward and back with arm elevation Rocking forward and back with arm reach up and over At the stairs 2nd step hamstring stretch 5x right/left Step ups at stair 2x 5 right/left Sit to stand from standing chair holding 4# DBs 2 x 10 Seated clam with red loop x 10 Standing red loop above knees hip abduction 10x (no UE support needed) Standing red loop above knees hip extension diagonal 10x (back of the chair touch for balance) Standing hip circles around tall cone 10x right/left (back of the chair touch for balance) Leg press seat 6 bil 60# 10x2 Resisted cable walk with belt 5# backwards only 8x Standing cable rows 5# 10x Discussed [facility] (she states she likes the machines)     06/27/24:  NuStep: level 4x 5 min-PT present to discuss progress and plan today's session Hip swings 10x right/left  Hip circles with 1/2 foam roll 5x each direction right/left  6 inch step ups in // bars with 2 hand support 10x right/left  Seated HS stretch 5x right/left Seated hip flexion/abduction with 4# weight resting on thigh as if getting in/out of the car 10x  Seated hip ER/figure 4 stretch 5x right/left  Sit to stand from mat table holding pair of 4# weights at chest 10x Seated internal/external rotation windshield wipers 10x Standing dead lifts with pair of 4# weights 10x (cues for forward gaze)  Seated lumbar 3 way stretch with green ball 10x      PATIENT EDUCATION:  Education details: PT eval findings, anticipated POC, initial HEP, and possible trial of aquatics   Person educated: Patient Education method: Explanation, Demonstration, Tactile cues, Verbal cues, and Handouts Education comprehension: verbalized understanding and returned demonstration  HOME EXERCISE PROGRAM: [id number]  ASSESSMENT:  CLINICAL IMPRESSION: [name]   verbalized feeling 98% better since starting skilled therapy. She has met all her goals and she is independent with HEP. Reviewed current HEP and updated it with exercise progressions. She is now negotiating stairs with a reciprocal pattern and able to do all functional activities with no difficulty. Patient to discharge home with HEP.     OBJECTIVE IMPAIRMENTS: Abnormal gait, decreased activity tolerance, decreased ROM, decreased strength, increased muscle spasms, impaired flexibility, postural dysfunction, and pain.   ACTIVITY LIMITATIONS: sitting, standing, squatting, sleeping, stairs, transfers, bed mobility, dressing, and locomotion level  PARTICIPATION LIMITATIONS: meal prep, cleaning, laundry, shopping, community activity, and occupation  PERSONAL FACTORS: Age and 3+ comorbidities: osteoporosis, chronic LBP, asthma, COPD, depression and HTN are also affecting patient's functional outcome.   REHAB POTENTIAL: Good  CLINICAL DECISION MAKING: Evolving/moderate complexity  EVALUATION COMPLEXITY: Moderate   GOALS: Goals reviewed with patient? Yes  SHORT TERM GOALS: Target date: 06/11/2024   Patient will be independent with initial HEP. Baseline:  Goal status: met 1/9   2.  Decreased hip pain by 30% with ADLs. Baseline:  Goal status: met 1/9  3.  Patient able to sleep through the night without waking from hip pain. Baseline:  Goal status: met 1/9    LONG TERM GOALS: Target date: 07/09/2024   Patient will be independent with advanced/ongoing HEP to improve outcomes and carryover.  Baseline:  Goal status: MET 07/09/2024  2.  Patient will report decreased pain by 75% with ADLS. Baseline:  Goal status: met 1/9 (90% better)  3.  Patient will demonstrate improved functional LE strength as demonstrated by improved 5XSTS by 2-3 seconds. Baseline: 20.64 seconds Goal status: MET 07/09/2024  4.   Improved LEFS by >= 9 points showing functional improvement. Baseline: 21/80 Goal  status:MET 07/09/2024     PLAN:  PT FREQUENCY: 2x/week  PT DURATION: 8 weeks  PLANNED INTERVENTIONS: 97110-Therapeutic exercises, 97530- Therapeutic activity, 97112- Neuromuscular re-education, 97535- Self Care, [id number]- Manual therapy, [phone]- Gait training, [phone]- Aquatic Therapy, [phone]- Electrical stimulation (unattended), [phone]- Ultrasound, [id number]- Ionotophoresis 4mg /ml Dexamethasone, 79439 (1-2 muscles), [id number] (3+ muscles)- Dry Needling, Patient/Family education, Balance training, Stair training, Taping, Joint mobilization, Spinal mobilization, Cryotherapy, and Moist heat  PLAN FOR NEXT SESSION: patient to discharge home with HEP  Kristeen Sar, PT, DPT 07/09/24 10:35 AM             "

## 2024-07-24 ENCOUNTER — Other Ambulatory Visit: Payer: Self-pay

## 2024-07-24 ENCOUNTER — Other Ambulatory Visit (HOSPITAL_COMMUNITY)

## 2024-07-24 DIAGNOSIS — K5904 Chronic idiopathic constipation: Secondary | ICD-10-CM

## 2024-07-24 NOTE — Telephone Encounter (Signed)
 Requesting: LINACLOTIDE  72 MCG PO CAPS Last Visit: 05/27/2024 Next Visit: 08/28/2024 Last Refill: 04/15/2024  Please Advise

## 2024-07-25 MED ORDER — LINACLOTIDE 72 MCG PO CAPS: ORAL_CAPSULE | ORAL | 0 refills | Status: DC

## 2024-07-25 MED ORDER — LINACLOTIDE 72 MCG PO CAPS: ORAL_CAPSULE | ORAL | 0 refills | Status: AC

## 2024-07-25 NOTE — Addendum Note (Signed)
 Addended by: ROLLENE VITA LABOR on: 07/25/2024 03:18 PM   Modules accepted: Orders

## 2024-07-25 NOTE — Telephone Encounter (Signed)
 Copied from CRM [id number]. Topic: Clinical - Prescription Issue >> Jul 25, 2024 12:31 PM Vena HERO wrote: Reason for CRM: Prentice from [facility] [phone] Called in today to let provider know that they will be cancelling the order for linaclotide  (LINZESS ) 72 MCG capsule due to this being a nebulizer medication and this company fills respiratory products only. Number provided above for questions or cencerns

## 2024-07-25 NOTE — Telephone Encounter (Addendum)
 Prev RX sent into incorrect pharmacy. Sent Linzess  to correct pharmacy.

## 2024-08-28 ENCOUNTER — Ambulatory Visit: Admitting: Family Medicine

## 2024-09-10 ENCOUNTER — Ambulatory Visit (HOSPITAL_COMMUNITY)

## 2024-09-12 ENCOUNTER — Ambulatory Visit: Admitting: Gastroenterology

## 2024-10-28 ENCOUNTER — Ambulatory Visit: Admitting: Neurology

## 2024-12-10 ENCOUNTER — Ambulatory Visit: Admitting: Emergency Medicine

## 2025-04-24 ENCOUNTER — Ambulatory Visit: Admitting: Podiatry
